# Patient Record
Sex: Male | Born: 1935
Health system: Southern US, Community
[De-identification: ages and names within clinical notes are randomized; demographics above are authoritative.]

## PROBLEM LIST (undated history)

## (undated) DIAGNOSIS — I10 Essential (primary) hypertension: Secondary | ICD-10-CM

## (undated) DIAGNOSIS — I714 Abdominal aortic aneurysm, without rupture: Secondary | ICD-10-CM

## (undated) DIAGNOSIS — M4800 Spinal stenosis, site unspecified: Secondary | ICD-10-CM

## (undated) DIAGNOSIS — G459 Transient cerebral ischemic attack, unspecified: Secondary | ICD-10-CM

## (undated) DIAGNOSIS — E7800 Pure hypercholesterolemia, unspecified: Secondary | ICD-10-CM

## (undated) DIAGNOSIS — I2119 ST elevation (STEMI) myocardial infarction involving other coronary artery of inferior wall: Secondary | ICD-10-CM

## (undated) DIAGNOSIS — G47419 Narcolepsy without cataplexy: Secondary | ICD-10-CM

## (undated) DIAGNOSIS — I2510 Atherosclerotic heart disease of native coronary artery without angina pectoris: Secondary | ICD-10-CM

## (undated) HISTORY — DX: Atherosclerotic heart disease of native coronary artery without angina pectoris: I25.10

## (undated) HISTORY — PX: LIPOMA EXCISION: SHX5283

## (undated) HISTORY — DX: ST elevation (STEMI) myocardial infarction involving other coronary artery of inferior wall: I21.19

## (undated) HISTORY — PX: SPINAL FIXATION SURGERY: SHX1055

## (undated) HISTORY — PX: CARDIAC CATHETERIZATION: SHX172

---

## 2015-07-13 DIAGNOSIS — M7580 Other shoulder lesions, unspecified shoulder: Secondary | ICD-10-CM | POA: Diagnosis not present

## 2015-08-08 DIAGNOSIS — M545 Low back pain: Secondary | ICD-10-CM | POA: Diagnosis not present

## 2015-08-08 DIAGNOSIS — M7581 Other shoulder lesions, right shoulder: Secondary | ICD-10-CM | POA: Diagnosis not present

## 2015-09-18 DIAGNOSIS — N183 Chronic kidney disease, stage 3 (moderate): Secondary | ICD-10-CM | POA: Diagnosis not present

## 2015-09-18 DIAGNOSIS — I129 Hypertensive chronic kidney disease with stage 1 through stage 4 chronic kidney disease, or unspecified chronic kidney disease: Secondary | ICD-10-CM | POA: Diagnosis not present

## 2015-09-18 DIAGNOSIS — D500 Iron deficiency anemia secondary to blood loss (chronic): Secondary | ICD-10-CM | POA: Diagnosis not present

## 2015-09-18 DIAGNOSIS — R21 Rash and other nonspecific skin eruption: Secondary | ICD-10-CM | POA: Diagnosis not present

## 2015-09-18 DIAGNOSIS — N185 Chronic kidney disease, stage 5: Secondary | ICD-10-CM | POA: Diagnosis not present

## 2015-10-25 DIAGNOSIS — D500 Iron deficiency anemia secondary to blood loss (chronic): Secondary | ICD-10-CM | POA: Diagnosis not present

## 2015-10-25 DIAGNOSIS — I5022 Chronic systolic (congestive) heart failure: Secondary | ICD-10-CM | POA: Diagnosis not present

## 2015-10-25 DIAGNOSIS — I129 Hypertensive chronic kidney disease with stage 1 through stage 4 chronic kidney disease, or unspecified chronic kidney disease: Secondary | ICD-10-CM | POA: Diagnosis not present

## 2015-10-26 DIAGNOSIS — D500 Iron deficiency anemia secondary to blood loss (chronic): Secondary | ICD-10-CM | POA: Diagnosis not present

## 2015-10-26 DIAGNOSIS — E538 Deficiency of other specified B group vitamins: Secondary | ICD-10-CM | POA: Diagnosis not present

## 2015-10-26 DIAGNOSIS — E782 Mixed hyperlipidemia: Secondary | ICD-10-CM | POA: Diagnosis not present

## 2015-10-26 DIAGNOSIS — N183 Chronic kidney disease, stage 3 (moderate): Secondary | ICD-10-CM | POA: Diagnosis not present

## 2015-11-15 DIAGNOSIS — Z8673 Personal history of transient ischemic attack (TIA), and cerebral infarction without residual deficits: Secondary | ICD-10-CM | POA: Diagnosis not present

## 2015-11-15 DIAGNOSIS — G459 Transient cerebral ischemic attack, unspecified: Secondary | ICD-10-CM | POA: Diagnosis not present

## 2015-11-16 DIAGNOSIS — Z8673 Personal history of transient ischemic attack (TIA), and cerebral infarction without residual deficits: Secondary | ICD-10-CM | POA: Diagnosis not present

## 2015-11-20 DIAGNOSIS — M1830 Unilateral post-traumatic osteoarthritis of first carpometacarpal joint, unspecified hand: Secondary | ICD-10-CM | POA: Diagnosis not present

## 2015-11-21 DIAGNOSIS — N183 Chronic kidney disease, stage 3 (moderate): Secondary | ICD-10-CM | POA: Diagnosis not present

## 2015-11-28 DIAGNOSIS — N183 Chronic kidney disease, stage 3 (moderate): Secondary | ICD-10-CM | POA: Diagnosis not present

## 2015-11-28 DIAGNOSIS — R39198 Other difficulties with micturition: Secondary | ICD-10-CM | POA: Diagnosis not present

## 2015-11-30 DIAGNOSIS — I509 Heart failure, unspecified: Secondary | ICD-10-CM | POA: Diagnosis not present

## 2015-11-30 DIAGNOSIS — Z01810 Encounter for preprocedural cardiovascular examination: Secondary | ICD-10-CM | POA: Diagnosis not present

## 2015-12-05 DIAGNOSIS — N183 Chronic kidney disease, stage 3 (moderate): Secondary | ICD-10-CM | POA: Diagnosis not present

## 2015-12-05 DIAGNOSIS — I272 Other secondary pulmonary hypertension: Secondary | ICD-10-CM | POA: Diagnosis not present

## 2015-12-07 DIAGNOSIS — I6782 Cerebral ischemia: Secondary | ICD-10-CM | POA: Diagnosis not present

## 2015-12-07 DIAGNOSIS — G319 Degenerative disease of nervous system, unspecified: Secondary | ICD-10-CM | POA: Diagnosis not present

## 2015-12-07 DIAGNOSIS — Z8673 Personal history of transient ischemic attack (TIA), and cerebral infarction without residual deficits: Secondary | ICD-10-CM | POA: Diagnosis not present

## 2015-12-18 DIAGNOSIS — M25561 Pain in right knee: Secondary | ICD-10-CM | POA: Diagnosis not present

## 2016-01-30 DIAGNOSIS — N183 Chronic kidney disease, stage 3 (moderate): Secondary | ICD-10-CM | POA: Diagnosis not present

## 2016-03-27 DIAGNOSIS — M12811 Other specific arthropathies, not elsewhere classified, right shoulder: Secondary | ICD-10-CM | POA: Diagnosis not present

## 2016-03-31 ENCOUNTER — Other Ambulatory Visit: Payer: Self-pay | Admitting: Geriatric Medicine

## 2016-03-31 DIAGNOSIS — Z23 Encounter for immunization: Secondary | ICD-10-CM | POA: Diagnosis not present

## 2016-03-31 DIAGNOSIS — I714 Abdominal aortic aneurysm, without rupture, unspecified: Secondary | ICD-10-CM

## 2016-03-31 DIAGNOSIS — R351 Nocturia: Secondary | ICD-10-CM | POA: Diagnosis not present

## 2016-03-31 DIAGNOSIS — I129 Hypertensive chronic kidney disease with stage 1 through stage 4 chronic kidney disease, or unspecified chronic kidney disease: Secondary | ICD-10-CM | POA: Diagnosis not present

## 2016-03-31 DIAGNOSIS — M109 Gout, unspecified: Secondary | ICD-10-CM | POA: Diagnosis not present

## 2016-03-31 DIAGNOSIS — D539 Nutritional anemia, unspecified: Secondary | ICD-10-CM | POA: Diagnosis not present

## 2016-03-31 DIAGNOSIS — N401 Enlarged prostate with lower urinary tract symptoms: Secondary | ICD-10-CM | POA: Diagnosis not present

## 2016-03-31 DIAGNOSIS — Z79899 Other long term (current) drug therapy: Secondary | ICD-10-CM | POA: Diagnosis not present

## 2016-03-31 DIAGNOSIS — R413 Other amnesia: Secondary | ICD-10-CM | POA: Diagnosis not present

## 2016-03-31 DIAGNOSIS — E7800 Pure hypercholesterolemia, unspecified: Secondary | ICD-10-CM | POA: Diagnosis not present

## 2016-03-31 DIAGNOSIS — Z7189 Other specified counseling: Secondary | ICD-10-CM | POA: Diagnosis not present

## 2016-04-09 DIAGNOSIS — M170 Bilateral primary osteoarthritis of knee: Secondary | ICD-10-CM | POA: Diagnosis not present

## 2016-04-09 DIAGNOSIS — M12811 Other specific arthropathies, not elsewhere classified, right shoulder: Secondary | ICD-10-CM | POA: Diagnosis not present

## 2016-04-09 DIAGNOSIS — M19011 Primary osteoarthritis, right shoulder: Secondary | ICD-10-CM | POA: Diagnosis not present

## 2016-04-09 DIAGNOSIS — M24173 Other articular cartilage disorders, unspecified ankle: Secondary | ICD-10-CM | POA: Diagnosis not present

## 2016-04-10 ENCOUNTER — Ambulatory Visit
Admission: RE | Admit: 2016-04-10 | Discharge: 2016-04-10 | Disposition: A | Discharge status: Home / Self Care | Payer: Medicare Other | Source: Ambulatory Visit | Attending: Geriatric Medicine | Admitting: Geriatric Medicine

## 2016-04-10 DIAGNOSIS — Z136 Encounter for screening for cardiovascular disorders: Secondary | ICD-10-CM | POA: Diagnosis not present

## 2016-04-10 DIAGNOSIS — I714 Abdominal aortic aneurysm, without rupture, unspecified: Secondary | ICD-10-CM

## 2016-04-16 DIAGNOSIS — R29898 Other symptoms and signs involving the musculoskeletal system: Secondary | ICD-10-CM | POA: Diagnosis not present

## 2016-04-22 DIAGNOSIS — R29898 Other symptoms and signs involving the musculoskeletal system: Secondary | ICD-10-CM | POA: Diagnosis not present

## 2016-04-24 DIAGNOSIS — R29898 Other symptoms and signs involving the musculoskeletal system: Secondary | ICD-10-CM | POA: Diagnosis not present

## 2016-04-29 DIAGNOSIS — R29898 Other symptoms and signs involving the musculoskeletal system: Secondary | ICD-10-CM | POA: Diagnosis not present

## 2016-05-01 DIAGNOSIS — R29898 Other symptoms and signs involving the musculoskeletal system: Secondary | ICD-10-CM | POA: Diagnosis not present

## 2016-05-06 DIAGNOSIS — R29898 Other symptoms and signs involving the musculoskeletal system: Secondary | ICD-10-CM | POA: Diagnosis not present

## 2016-05-08 DIAGNOSIS — R29898 Other symptoms and signs involving the musculoskeletal system: Secondary | ICD-10-CM | POA: Diagnosis not present

## 2016-05-13 DIAGNOSIS — R29898 Other symptoms and signs involving the musculoskeletal system: Secondary | ICD-10-CM | POA: Diagnosis not present

## 2016-05-15 DIAGNOSIS — R29898 Other symptoms and signs involving the musculoskeletal system: Secondary | ICD-10-CM | POA: Diagnosis not present

## 2016-07-03 DIAGNOSIS — M545 Low back pain: Secondary | ICD-10-CM | POA: Diagnosis not present

## 2016-07-03 DIAGNOSIS — M170 Bilateral primary osteoarthritis of knee: Secondary | ICD-10-CM | POA: Diagnosis not present

## 2016-07-03 DIAGNOSIS — M1711 Unilateral primary osteoarthritis, right knee: Secondary | ICD-10-CM | POA: Diagnosis not present

## 2016-07-10 DIAGNOSIS — M545 Low back pain: Secondary | ICD-10-CM | POA: Diagnosis not present

## 2016-07-17 DIAGNOSIS — M48062 Spinal stenosis, lumbar region with neurogenic claudication: Secondary | ICD-10-CM | POA: Diagnosis not present

## 2016-07-17 DIAGNOSIS — M545 Low back pain: Secondary | ICD-10-CM | POA: Diagnosis not present

## 2016-07-17 DIAGNOSIS — M5137 Other intervertebral disc degeneration, lumbosacral region: Secondary | ICD-10-CM | POA: Diagnosis not present

## 2016-07-21 DIAGNOSIS — M48062 Spinal stenosis, lumbar region with neurogenic claudication: Secondary | ICD-10-CM | POA: Diagnosis not present

## 2016-07-29 DIAGNOSIS — D649 Anemia, unspecified: Secondary | ICD-10-CM | POA: Diagnosis not present

## 2016-07-29 DIAGNOSIS — D692 Other nonthrombocytopenic purpura: Secondary | ICD-10-CM | POA: Diagnosis not present

## 2016-07-29 DIAGNOSIS — I129 Hypertensive chronic kidney disease with stage 1 through stage 4 chronic kidney disease, or unspecified chronic kidney disease: Secondary | ICD-10-CM | POA: Diagnosis not present

## 2016-07-29 DIAGNOSIS — I270 Primary pulmonary hypertension: Secondary | ICD-10-CM | POA: Diagnosis not present

## 2016-07-29 DIAGNOSIS — I700 Atherosclerosis of aorta: Secondary | ICD-10-CM | POA: Diagnosis not present

## 2016-07-29 DIAGNOSIS — Z79899 Other long term (current) drug therapy: Secondary | ICD-10-CM | POA: Diagnosis not present

## 2016-08-04 DIAGNOSIS — L570 Actinic keratosis: Secondary | ICD-10-CM | POA: Diagnosis not present

## 2016-08-04 DIAGNOSIS — Z23 Encounter for immunization: Secondary | ICD-10-CM | POA: Diagnosis not present

## 2016-08-04 DIAGNOSIS — D225 Melanocytic nevi of trunk: Secondary | ICD-10-CM | POA: Diagnosis not present

## 2016-08-04 DIAGNOSIS — L821 Other seborrheic keratosis: Secondary | ICD-10-CM | POA: Diagnosis not present

## 2016-08-04 DIAGNOSIS — L814 Other melanin hyperpigmentation: Secondary | ICD-10-CM | POA: Diagnosis not present

## 2016-08-04 DIAGNOSIS — L219 Seborrheic dermatitis, unspecified: Secondary | ICD-10-CM | POA: Diagnosis not present

## 2016-08-04 DIAGNOSIS — D1801 Hemangioma of skin and subcutaneous tissue: Secondary | ICD-10-CM | POA: Diagnosis not present

## 2016-08-05 DIAGNOSIS — M48062 Spinal stenosis, lumbar region with neurogenic claudication: Secondary | ICD-10-CM | POA: Diagnosis not present

## 2016-09-16 DIAGNOSIS — L309 Dermatitis, unspecified: Secondary | ICD-10-CM | POA: Diagnosis not present

## 2016-09-16 DIAGNOSIS — M5137 Other intervertebral disc degeneration, lumbosacral region: Secondary | ICD-10-CM | POA: Diagnosis not present

## 2016-09-16 DIAGNOSIS — L821 Other seborrheic keratosis: Secondary | ICD-10-CM | POA: Diagnosis not present

## 2016-09-16 DIAGNOSIS — Z23 Encounter for immunization: Secondary | ICD-10-CM | POA: Diagnosis not present

## 2016-09-16 DIAGNOSIS — M48062 Spinal stenosis, lumbar region with neurogenic claudication: Secondary | ICD-10-CM | POA: Diagnosis not present

## 2016-09-16 DIAGNOSIS — L219 Seborrheic dermatitis, unspecified: Secondary | ICD-10-CM | POA: Diagnosis not present

## 2016-09-29 DIAGNOSIS — L309 Dermatitis, unspecified: Secondary | ICD-10-CM | POA: Diagnosis not present

## 2016-10-06 DIAGNOSIS — N183 Chronic kidney disease, stage 3 (moderate): Secondary | ICD-10-CM | POA: Diagnosis not present

## 2016-10-06 DIAGNOSIS — I129 Hypertensive chronic kidney disease with stage 1 through stage 4 chronic kidney disease, or unspecified chronic kidney disease: Secondary | ICD-10-CM | POA: Diagnosis not present

## 2016-10-06 DIAGNOSIS — L299 Pruritus, unspecified: Secondary | ICD-10-CM | POA: Diagnosis not present

## 2016-10-06 DIAGNOSIS — D692 Other nonthrombocytopenic purpura: Secondary | ICD-10-CM | POA: Diagnosis not present

## 2016-10-06 DIAGNOSIS — Z79899 Other long term (current) drug therapy: Secondary | ICD-10-CM | POA: Diagnosis not present

## 2016-10-13 DIAGNOSIS — F22 Delusional disorders: Secondary | ICD-10-CM | POA: Diagnosis not present

## 2016-10-23 DIAGNOSIS — M79642 Pain in left hand: Secondary | ICD-10-CM | POA: Diagnosis not present

## 2016-10-23 DIAGNOSIS — M79641 Pain in right hand: Secondary | ICD-10-CM | POA: Diagnosis not present

## 2016-10-23 DIAGNOSIS — M720 Palmar fascial fibromatosis [Dupuytren]: Secondary | ICD-10-CM | POA: Diagnosis not present

## 2016-11-03 DIAGNOSIS — M720 Palmar fascial fibromatosis [Dupuytren]: Secondary | ICD-10-CM | POA: Diagnosis not present

## 2016-11-05 DIAGNOSIS — M720 Palmar fascial fibromatosis [Dupuytren]: Secondary | ICD-10-CM | POA: Diagnosis not present

## 2016-11-19 DIAGNOSIS — M720 Palmar fascial fibromatosis [Dupuytren]: Secondary | ICD-10-CM | POA: Diagnosis not present

## 2016-12-22 DIAGNOSIS — M720 Palmar fascial fibromatosis [Dupuytren]: Secondary | ICD-10-CM | POA: Diagnosis not present

## 2016-12-24 DIAGNOSIS — M720 Palmar fascial fibromatosis [Dupuytren]: Secondary | ICD-10-CM | POA: Diagnosis not present

## 2017-01-07 DIAGNOSIS — M720 Palmar fascial fibromatosis [Dupuytren]: Secondary | ICD-10-CM | POA: Diagnosis not present

## 2017-01-12 DIAGNOSIS — I700 Atherosclerosis of aorta: Secondary | ICD-10-CM | POA: Diagnosis not present

## 2017-01-12 DIAGNOSIS — Z1389 Encounter for screening for other disorder: Secondary | ICD-10-CM | POA: Diagnosis not present

## 2017-01-12 DIAGNOSIS — I270 Primary pulmonary hypertension: Secondary | ICD-10-CM | POA: Diagnosis not present

## 2017-01-12 DIAGNOSIS — E669 Obesity, unspecified: Secondary | ICD-10-CM | POA: Diagnosis not present

## 2017-01-12 DIAGNOSIS — N183 Chronic kidney disease, stage 3 (moderate): Secondary | ICD-10-CM | POA: Diagnosis not present

## 2017-01-12 DIAGNOSIS — E7800 Pure hypercholesterolemia, unspecified: Secondary | ICD-10-CM | POA: Diagnosis not present

## 2017-01-12 DIAGNOSIS — Z79899 Other long term (current) drug therapy: Secondary | ICD-10-CM | POA: Diagnosis not present

## 2017-01-12 DIAGNOSIS — Z0000 Encounter for general adult medical examination without abnormal findings: Secondary | ICD-10-CM | POA: Diagnosis not present

## 2017-01-12 DIAGNOSIS — I129 Hypertensive chronic kidney disease with stage 1 through stage 4 chronic kidney disease, or unspecified chronic kidney disease: Secondary | ICD-10-CM | POA: Diagnosis not present

## 2017-01-12 DIAGNOSIS — Z6830 Body mass index (BMI) 30.0-30.9, adult: Secondary | ICD-10-CM | POA: Diagnosis not present

## 2017-02-24 DIAGNOSIS — L299 Pruritus, unspecified: Secondary | ICD-10-CM | POA: Diagnosis not present

## 2017-02-24 DIAGNOSIS — L739 Follicular disorder, unspecified: Secondary | ICD-10-CM | POA: Diagnosis not present

## 2017-03-11 DIAGNOSIS — Z79899 Other long term (current) drug therapy: Secondary | ICD-10-CM | POA: Diagnosis not present

## 2017-04-03 DIAGNOSIS — Z23 Encounter for immunization: Secondary | ICD-10-CM | POA: Diagnosis not present

## 2017-05-11 DIAGNOSIS — M109 Gout, unspecified: Secondary | ICD-10-CM | POA: Diagnosis not present

## 2017-05-18 DIAGNOSIS — F424 Excoriation (skin-picking) disorder: Secondary | ICD-10-CM | POA: Diagnosis not present

## 2017-05-18 DIAGNOSIS — L570 Actinic keratosis: Secondary | ICD-10-CM | POA: Diagnosis not present

## 2017-05-25 IMAGING — US US AORTA
1 series · 14 of 20 positions shown · non-contrast
Comparison: None.

CLINICAL DATA: Screening for abdominal aortic aneurysm.

EXAM:
ULTRASOUND OF ABDOMINAL AORTA
TECHNIQUE: Ultrasound examination of the abdominal aorta was performed to
evaluate for abdominal aortic aneurysm.

[Series 1: us aorta · 0.37mm/px · 14 of 20 slices shown]
[im 1/20]
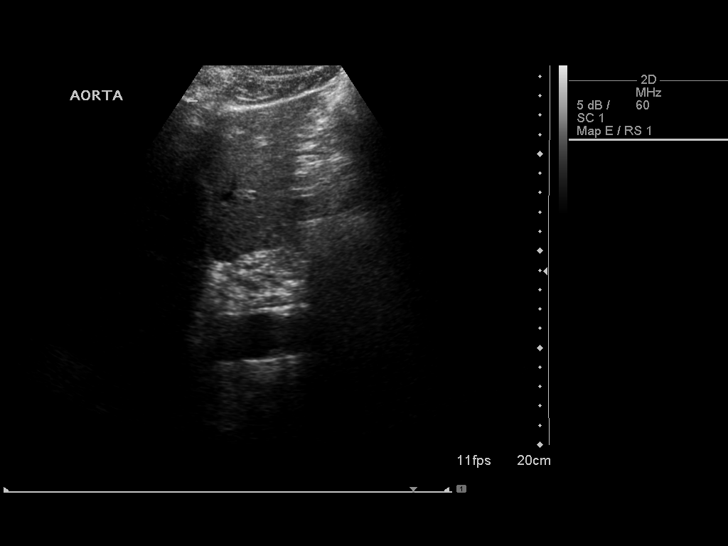
[im 3/20]
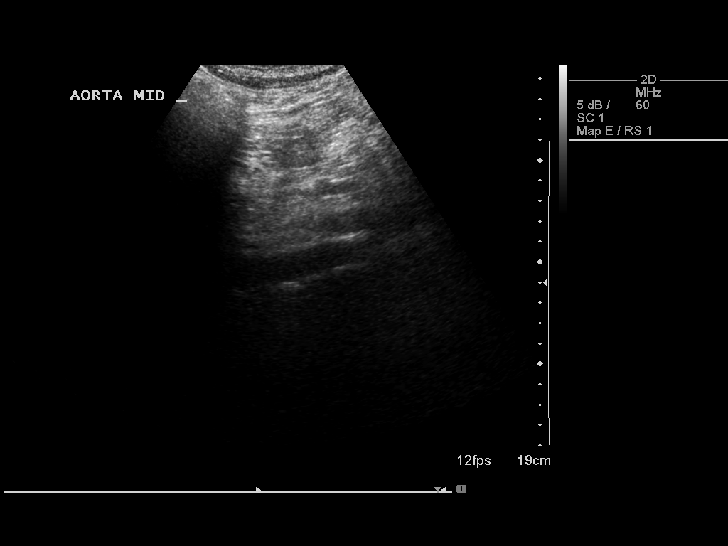
[im 4/20]
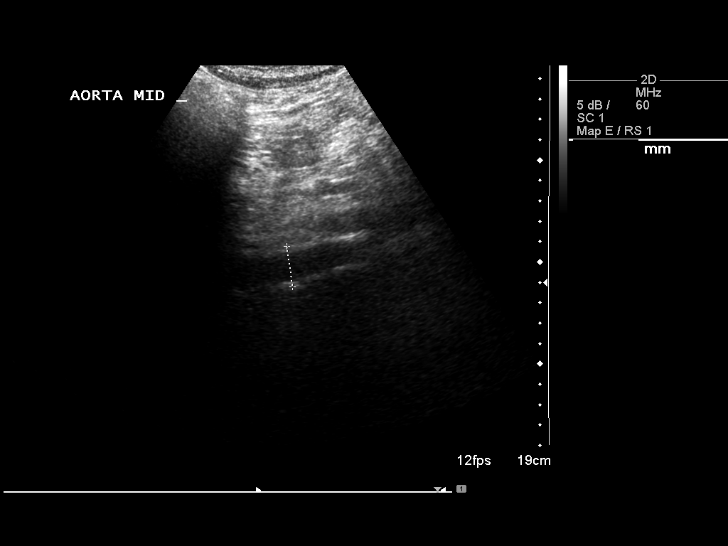
[im 6/20]
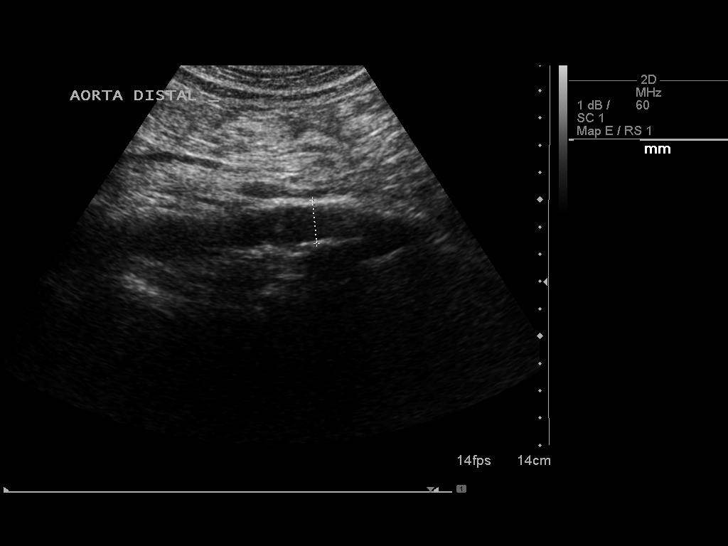
[im 7/20]
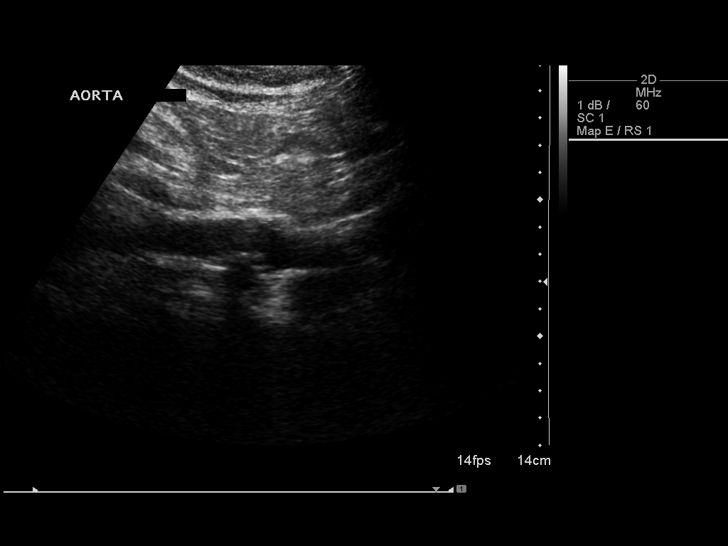
[im 8/20]
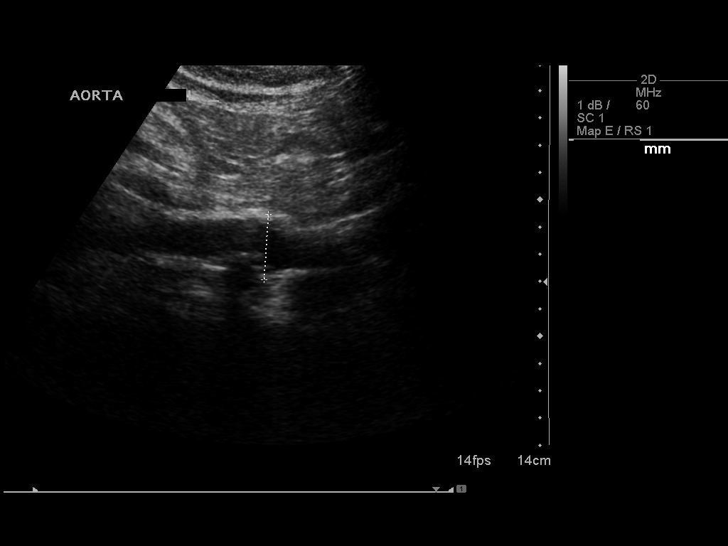
[im 10/20]
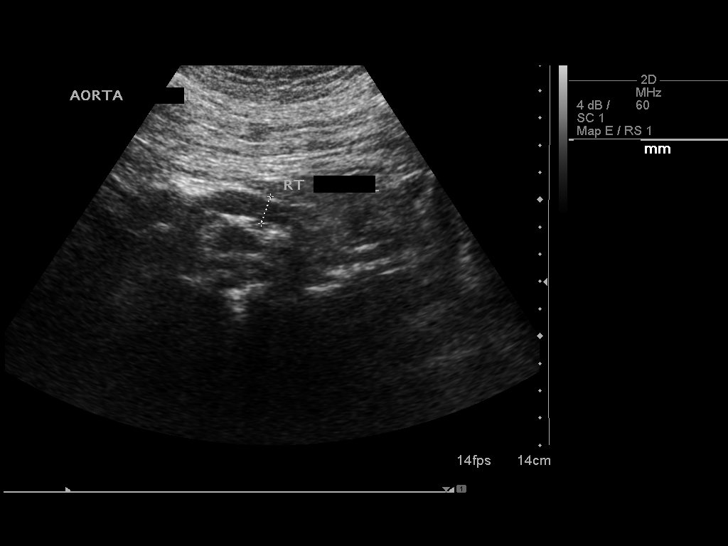
[im 11/20]
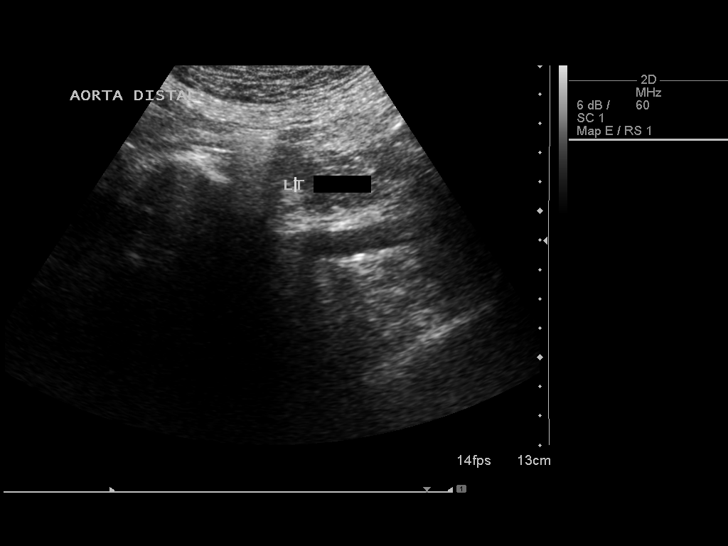
[im 13/20]
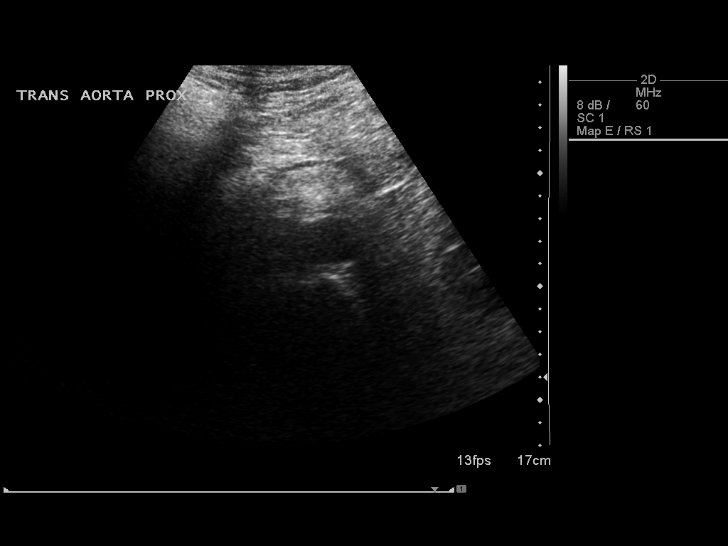
[im 14/20]
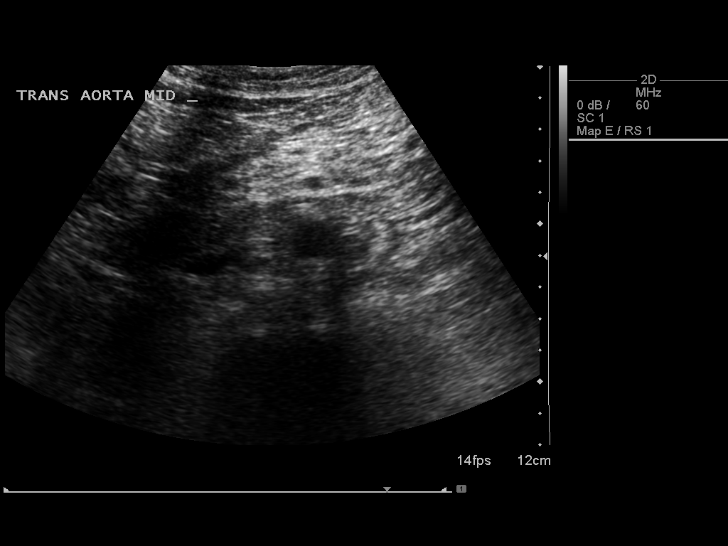
[im 16/20]
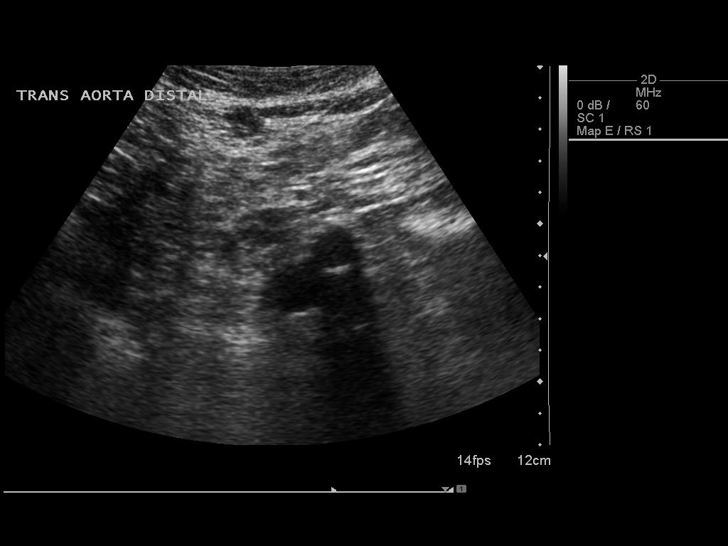
[im 17/20]
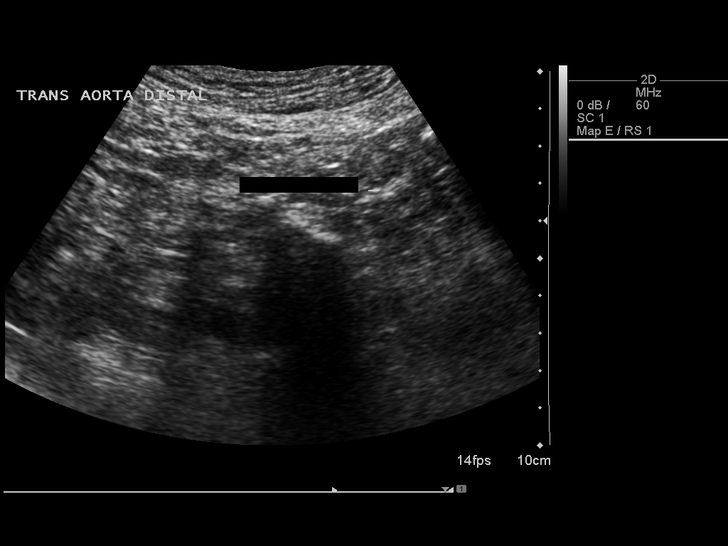
[im 18/20]
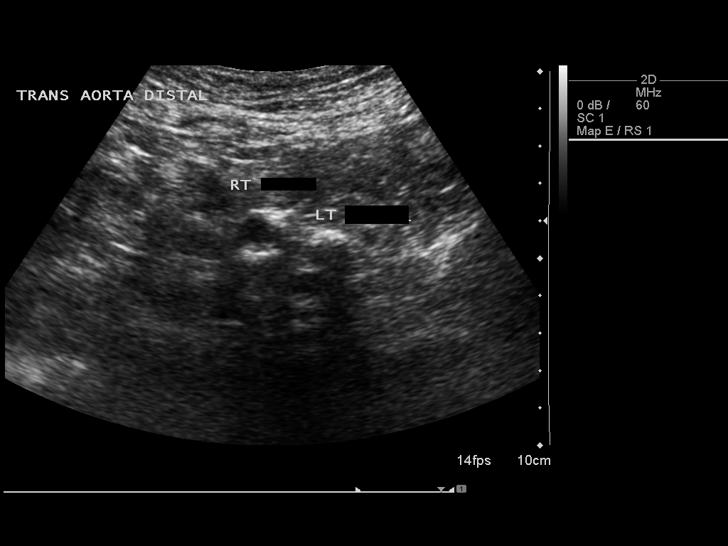
[im 20/20]
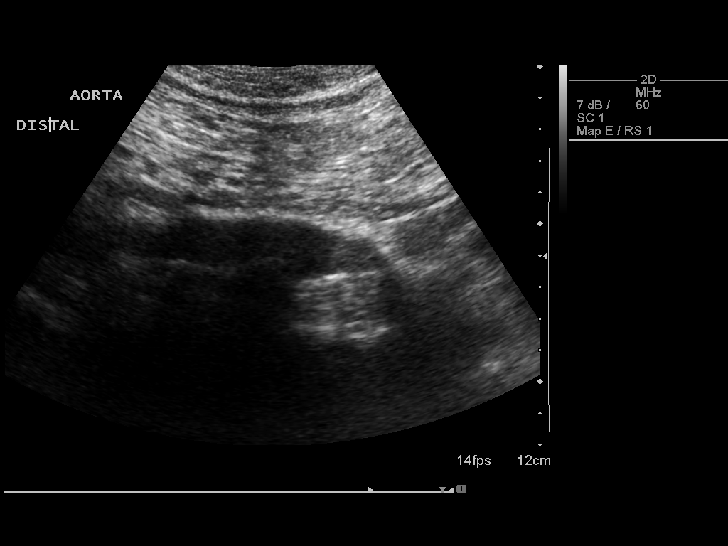

[14 of 20 positions shown; findings below may reference images not displayed]

FINDINGS: Abdominal Aorta

Atherosclerosis of abdominal aorta is noted.

Maximum Diameter: 2.8 cm proximally.

Right common iliac artery measures 1.1 cm. Left common iliac artery
measures 1.2 cm.
IMPRESSION: Atherosclerosis of abdominal aorta is noted. No evidence of
abdominal aortic aneurysm.

## 2017-07-14 DIAGNOSIS — N183 Chronic kidney disease, stage 3 (moderate): Secondary | ICD-10-CM | POA: Diagnosis not present

## 2017-07-14 DIAGNOSIS — I129 Hypertensive chronic kidney disease with stage 1 through stage 4 chronic kidney disease, or unspecified chronic kidney disease: Secondary | ICD-10-CM | POA: Diagnosis not present

## 2017-10-19 DIAGNOSIS — H2513 Age-related nuclear cataract, bilateral: Secondary | ICD-10-CM | POA: Diagnosis not present

## 2017-11-02 DIAGNOSIS — H524 Presbyopia: Secondary | ICD-10-CM | POA: Diagnosis not present

## 2017-11-02 DIAGNOSIS — H2513 Age-related nuclear cataract, bilateral: Secondary | ICD-10-CM | POA: Diagnosis not present

## 2017-11-02 DIAGNOSIS — H31002 Unspecified chorioretinal scars, left eye: Secondary | ICD-10-CM | POA: Diagnosis not present

## 2017-11-02 DIAGNOSIS — H25043 Posterior subcapsular polar age-related cataract, bilateral: Secondary | ICD-10-CM | POA: Diagnosis not present

## 2017-11-03 DIAGNOSIS — D539 Nutritional anemia, unspecified: Secondary | ICD-10-CM | POA: Diagnosis not present

## 2017-11-03 DIAGNOSIS — I129 Hypertensive chronic kidney disease with stage 1 through stage 4 chronic kidney disease, or unspecified chronic kidney disease: Secondary | ICD-10-CM | POA: Diagnosis not present

## 2017-11-03 DIAGNOSIS — Z79899 Other long term (current) drug therapy: Secondary | ICD-10-CM | POA: Diagnosis not present

## 2017-11-03 DIAGNOSIS — G4489 Other headache syndrome: Secondary | ICD-10-CM | POA: Diagnosis not present

## 2017-11-03 DIAGNOSIS — L989 Disorder of the skin and subcutaneous tissue, unspecified: Secondary | ICD-10-CM | POA: Diagnosis not present

## 2017-11-03 DIAGNOSIS — H539 Unspecified visual disturbance: Secondary | ICD-10-CM | POA: Diagnosis not present

## 2017-11-03 DIAGNOSIS — N183 Chronic kidney disease, stage 3 (moderate): Secondary | ICD-10-CM | POA: Diagnosis not present

## 2017-11-28 DIAGNOSIS — I2119 ST elevation (STEMI) myocardial infarction involving other coronary artery of inferior wall: Secondary | ICD-10-CM

## 2017-11-28 HISTORY — DX: ST elevation (STEMI) myocardial infarction involving other coronary artery of inferior wall: I21.19

## 2017-11-29 ENCOUNTER — Inpatient Hospital Stay (HOSPITAL_COMMUNITY)
Admission: EM | Admit: 2017-11-29 | Discharge: 2017-12-11 | DRG: 247 | Disposition: A | Discharge status: Home / Self Care | Payer: Medicare Other | Attending: Cardiology | Admitting: Cardiology

## 2017-11-29 ENCOUNTER — Encounter (HOSPITAL_COMMUNITY): Payer: Self-pay | Admitting: Emergency Medicine

## 2017-11-29 ENCOUNTER — Emergency Department (HOSPITAL_COMMUNITY): Payer: Medicare Other

## 2017-11-29 ENCOUNTER — Other Ambulatory Visit: Payer: Self-pay

## 2017-11-29 ENCOUNTER — Inpatient Hospital Stay (HOSPITAL_COMMUNITY)
Admission: EM | Disposition: A | Discharge status: Home / Self Care | Payer: Self-pay | Source: Home / Self Care | Attending: Cardiology

## 2017-11-29 DIAGNOSIS — D638 Anemia in other chronic diseases classified elsewhere: Secondary | ICD-10-CM | POA: Diagnosis present

## 2017-11-29 DIAGNOSIS — D7589 Other specified diseases of blood and blood-forming organs: Secondary | ICD-10-CM | POA: Diagnosis not present

## 2017-11-29 DIAGNOSIS — L7632 Postprocedural hematoma of skin and subcutaneous tissue following other procedure: Secondary | ICD-10-CM | POA: Diagnosis not present

## 2017-11-29 DIAGNOSIS — I10 Essential (primary) hypertension: Secondary | ICD-10-CM | POA: Diagnosis present

## 2017-11-29 DIAGNOSIS — M109 Gout, unspecified: Secondary | ICD-10-CM | POA: Diagnosis present

## 2017-11-29 DIAGNOSIS — E875 Hyperkalemia: Secondary | ICD-10-CM | POA: Diagnosis present

## 2017-11-29 DIAGNOSIS — Y92238 Other place in hospital as the place of occurrence of the external cause: Secondary | ICD-10-CM | POA: Diagnosis not present

## 2017-11-29 DIAGNOSIS — E7800 Pure hypercholesterolemia, unspecified: Secondary | ICD-10-CM | POA: Diagnosis not present

## 2017-11-29 DIAGNOSIS — I472 Ventricular tachycardia: Secondary | ICD-10-CM | POA: Diagnosis not present

## 2017-11-29 DIAGNOSIS — E785 Hyperlipidemia, unspecified: Secondary | ICD-10-CM | POA: Diagnosis present

## 2017-11-29 DIAGNOSIS — R0602 Shortness of breath: Secondary | ICD-10-CM | POA: Diagnosis not present

## 2017-11-29 DIAGNOSIS — I213 ST elevation (STEMI) myocardial infarction of unspecified site: Secondary | ICD-10-CM

## 2017-11-29 DIAGNOSIS — L89020 Pressure ulcer of left elbow, unstageable: Secondary | ICD-10-CM | POA: Diagnosis not present

## 2017-11-29 DIAGNOSIS — Z6832 Body mass index (BMI) 32.0-32.9, adult: Secondary | ICD-10-CM | POA: Diagnosis not present

## 2017-11-29 DIAGNOSIS — K5900 Constipation, unspecified: Secondary | ICD-10-CM | POA: Diagnosis not present

## 2017-11-29 DIAGNOSIS — I130 Hypertensive heart and chronic kidney disease with heart failure and stage 1 through stage 4 chronic kidney disease, or unspecified chronic kidney disease: Secondary | ICD-10-CM | POA: Diagnosis not present

## 2017-11-29 DIAGNOSIS — N401 Enlarged prostate with lower urinary tract symptoms: Secondary | ICD-10-CM | POA: Diagnosis present

## 2017-11-29 DIAGNOSIS — R05 Cough: Secondary | ICD-10-CM | POA: Diagnosis not present

## 2017-11-29 DIAGNOSIS — Z79899 Other long term (current) drug therapy: Secondary | ICD-10-CM

## 2017-11-29 DIAGNOSIS — D696 Thrombocytopenia, unspecified: Secondary | ICD-10-CM | POA: Diagnosis not present

## 2017-11-29 DIAGNOSIS — N390 Urinary tract infection, site not specified: Secondary | ICD-10-CM | POA: Diagnosis not present

## 2017-11-29 DIAGNOSIS — Y840 Cardiac catheterization as the cause of abnormal reaction of the patient, or of later complication, without mention of misadventure at the time of the procedure: Secondary | ICD-10-CM | POA: Diagnosis not present

## 2017-11-29 DIAGNOSIS — Z7982 Long term (current) use of aspirin: Secondary | ICD-10-CM

## 2017-11-29 DIAGNOSIS — I25110 Atherosclerotic heart disease of native coronary artery with unstable angina pectoris: Secondary | ICD-10-CM | POA: Diagnosis not present

## 2017-11-29 DIAGNOSIS — I2111 ST elevation (STEMI) myocardial infarction involving right coronary artery: Secondary | ICD-10-CM

## 2017-11-29 DIAGNOSIS — I340 Nonrheumatic mitral (valve) insufficiency: Secondary | ICD-10-CM | POA: Diagnosis not present

## 2017-11-29 DIAGNOSIS — I4891 Unspecified atrial fibrillation: Secondary | ICD-10-CM

## 2017-11-29 DIAGNOSIS — E669 Obesity, unspecified: Secondary | ICD-10-CM | POA: Diagnosis present

## 2017-11-29 DIAGNOSIS — M7989 Other specified soft tissue disorders: Secondary | ICD-10-CM | POA: Diagnosis not present

## 2017-11-29 DIAGNOSIS — I2119 ST elevation (STEMI) myocardial infarction involving other coronary artery of inferior wall: Secondary | ICD-10-CM | POA: Diagnosis not present

## 2017-11-29 DIAGNOSIS — R338 Other retention of urine: Secondary | ICD-10-CM | POA: Diagnosis present

## 2017-11-29 DIAGNOSIS — Z955 Presence of coronary angioplasty implant and graft: Secondary | ICD-10-CM | POA: Diagnosis not present

## 2017-11-29 DIAGNOSIS — B9620 Unspecified Escherichia coli [E. coli] as the cause of diseases classified elsewhere: Secondary | ICD-10-CM | POA: Diagnosis not present

## 2017-11-29 DIAGNOSIS — N183 Chronic kidney disease, stage 3 unspecified: Secondary | ICD-10-CM

## 2017-11-29 DIAGNOSIS — R972 Elevated prostate specific antigen [PSA]: Secondary | ICD-10-CM

## 2017-11-29 DIAGNOSIS — I2510 Atherosclerotic heart disease of native coronary artery without angina pectoris: Secondary | ICD-10-CM | POA: Diagnosis not present

## 2017-11-29 DIAGNOSIS — I252 Old myocardial infarction: Secondary | ICD-10-CM | POA: Diagnosis not present

## 2017-11-29 DIAGNOSIS — I509 Heart failure, unspecified: Secondary | ICD-10-CM

## 2017-11-29 DIAGNOSIS — I110 Hypertensive heart disease with heart failure: Secondary | ICD-10-CM | POA: Diagnosis not present

## 2017-11-29 DIAGNOSIS — N179 Acute kidney failure, unspecified: Secondary | ICD-10-CM | POA: Diagnosis not present

## 2017-11-29 DIAGNOSIS — N184 Chronic kidney disease, stage 4 (severe): Secondary | ICD-10-CM | POA: Diagnosis not present

## 2017-11-29 DIAGNOSIS — Z8673 Personal history of transient ischemic attack (TIA), and cerebral infarction without residual deficits: Secondary | ICD-10-CM

## 2017-11-29 DIAGNOSIS — L8990 Pressure ulcer of unspecified site, unspecified stage: Secondary | ICD-10-CM

## 2017-11-29 DIAGNOSIS — N289 Disorder of kidney and ureter, unspecified: Secondary | ICD-10-CM | POA: Diagnosis not present

## 2017-11-29 DIAGNOSIS — I2121 ST elevation (STEMI) myocardial infarction involving left circumflex coronary artery: Secondary | ICD-10-CM | POA: Diagnosis not present

## 2017-11-29 DIAGNOSIS — I129 Hypertensive chronic kidney disease with stage 1 through stage 4 chronic kidney disease, or unspecified chronic kidney disease: Secondary | ICD-10-CM | POA: Diagnosis not present

## 2017-11-29 DIAGNOSIS — Z87891 Personal history of nicotine dependence: Secondary | ICD-10-CM

## 2017-11-29 DIAGNOSIS — E8770 Fluid overload, unspecified: Secondary | ICD-10-CM | POA: Diagnosis not present

## 2017-11-29 DIAGNOSIS — I97630 Postprocedural hematoma of a circulatory system organ or structure following a cardiac catheterization: Secondary | ICD-10-CM | POA: Diagnosis not present

## 2017-11-29 DIAGNOSIS — D631 Anemia in chronic kidney disease: Secondary | ICD-10-CM | POA: Diagnosis not present

## 2017-11-29 DIAGNOSIS — R001 Bradycardia, unspecified: Secondary | ICD-10-CM | POA: Diagnosis not present

## 2017-11-29 DIAGNOSIS — I480 Paroxysmal atrial fibrillation: Secondary | ICD-10-CM | POA: Diagnosis present

## 2017-11-29 HISTORY — PX: CORONARY BALLOON ANGIOPLASTY: CATH118233

## 2017-11-29 HISTORY — PX: LEFT HEART CATH AND CORONARY ANGIOGRAPHY: CATH118249

## 2017-11-29 HISTORY — DX: Essential (primary) hypertension: I10

## 2017-11-29 HISTORY — DX: Transient cerebral ischemic attack, unspecified: G45.9

## 2017-11-29 HISTORY — DX: Narcolepsy without cataplexy: G47.419

## 2017-11-29 HISTORY — DX: Unspecified atrial fibrillation: I48.91

## 2017-11-29 HISTORY — DX: Abdominal aortic aneurysm, without rupture: I71.4

## 2017-11-29 HISTORY — DX: Spinal stenosis, site unspecified: M48.00

## 2017-11-29 HISTORY — DX: Pure hypercholesterolemia, unspecified: E78.00

## 2017-11-29 LAB — CBC
HCT: 32.3 % — ABNORMAL LOW (ref 39.0–52.0)
Hemoglobin: 10.5 g/dL — ABNORMAL LOW (ref 13.0–17.0)
MCH: 34.3 pg — AB (ref 26.0–34.0)
MCHC: 32.5 g/dL (ref 30.0–36.0)
MCV: 105.6 fL — ABNORMAL HIGH (ref 78.0–100.0)
PLATELETS: 194 10*3/uL (ref 150–400)
RBC: 3.06 MIL/uL — AB (ref 4.22–5.81)
RDW: 12.7 % (ref 11.5–15.5)
WBC: 11.1 10*3/uL — ABNORMAL HIGH (ref 4.0–10.5)

## 2017-11-29 LAB — BASIC METABOLIC PANEL
Anion gap: 10 (ref 5–15)
BUN: 52 mg/dL — AB (ref 6–20)
CALCIUM: 8.1 mg/dL — AB (ref 8.9–10.3)
CHLORIDE: 115 mmol/L — AB (ref 101–111)
CO2: 13 mmol/L — AB (ref 22–32)
CREATININE: 1.99 mg/dL — AB (ref 0.61–1.24)
GFR calc non Af Amer: 29 mL/min — ABNORMAL LOW (ref >60)
GFR, EST AFRICAN AMERICAN: 34 mL/min — AB (ref >60)
GLUCOSE: 162 mg/dL — AB (ref 65–99)
Potassium: 5.2 mmol/L — ABNORMAL HIGH (ref 3.5–5.1)
Sodium: 138 mmol/L (ref 135–145)

## 2017-11-29 LAB — I-STAT TROPONIN, ED: TROPONIN I, POC: 1.84 ng/mL — AB (ref 0.00–0.08)

## 2017-11-29 SURGERY — LEFT HEART CATH AND CORONARY ANGIOGRAPHY
Anesthesia: LOCAL

## 2017-11-29 MED ORDER — HYDRALAZINE HCL 20 MG/ML IJ SOLN: 5.0000 mg | INTRAMUSCULAR | Status: AC | PRN

## 2017-11-29 MED ORDER — METHYLPREDNISOLONE SODIUM SUCC 125 MG IJ SOLR
125.0000 mg | Freq: Once | INTRAMUSCULAR | Status: AC
  Administered 2017-11-29: 125 mg via INTRAVENOUS
  Filled 2017-11-29: qty 2

## 2017-11-29 MED ORDER — CANGRELOR BOLUS VIA INFUSION
INTRAVENOUS | Status: DC | PRN
  Administered 2017-11-29: 2490 ug via INTRAVENOUS

## 2017-11-29 MED ORDER — ONDANSETRON HCL 4 MG/2ML IJ SOLN: 4.0000 mg | Freq: Four times a day (QID) | INTRAMUSCULAR | Status: DC | PRN

## 2017-11-29 MED ORDER — ASPIRIN 81 MG PO CHEW
324.0000 mg | CHEWABLE_TABLET | Freq: Once | ORAL | Status: AC
  Administered 2017-11-29: 324 mg via ORAL
  Filled 2017-11-29: qty 4

## 2017-11-29 MED ORDER — LABETALOL HCL 5 MG/ML IV SOLN: 10.0000 mg | INTRAVENOUS | Status: AC | PRN

## 2017-11-29 MED ORDER — VERAPAMIL HCL 2.5 MG/ML IV SOLN
INTRAVENOUS | Status: AC
  Filled 2017-11-29: qty 2

## 2017-11-29 MED ORDER — NITROGLYCERIN 0.4 MG SL SUBL: 0.4000 mg | SUBLINGUAL_TABLET | SUBLINGUAL | Status: DC | PRN

## 2017-11-29 MED ORDER — ACETAMINOPHEN 325 MG PO TABS: 650.0000 mg | ORAL_TABLET | ORAL | Status: DC | PRN

## 2017-11-29 MED ORDER — SODIUM CHLORIDE 0.9 % IV SOLN
INTRAVENOUS | Status: AC
  Administered 2017-11-29: 23:00:00 via INTRAVENOUS

## 2017-11-29 MED ORDER — NITROGLYCERIN 1 MG/10 ML FOR IR/CATH LAB
INTRA_ARTERIAL | Status: AC
  Filled 2017-11-29: qty 10

## 2017-11-29 MED ORDER — SODIUM CHLORIDE 0.9% FLUSH
3.0000 mL | Freq: Two times a day (BID) | INTRAVENOUS | Status: DC
  Administered 2017-11-30 – 2017-12-08 (×9): 3 mL via INTRAVENOUS

## 2017-11-29 MED ORDER — ACETAMINOPHEN 325 MG PO TABS
650.0000 mg | ORAL_TABLET | ORAL | Status: DC | PRN
  Administered 2017-12-02: 650 mg via ORAL
  Filled 2017-11-29: qty 2

## 2017-11-29 MED ORDER — VERAPAMIL HCL 2.5 MG/ML IV SOLN
INTRAVENOUS | Status: DC | PRN
  Administered 2017-11-29: 10 mL via INTRA_ARTERIAL

## 2017-11-29 MED ORDER — AMIODARONE LOAD VIA INFUSION
150.0000 mg | Freq: Once | INTRAVENOUS | Status: AC
  Administered 2017-11-29: 150 mg via INTRAVENOUS
  Filled 2017-11-29: qty 83.34

## 2017-11-29 MED ORDER — HEPARIN BOLUS VIA INFUSION
4000.0000 [IU] | Freq: Once | INTRAVENOUS | Status: AC
  Administered 2017-11-29: 4000 [IU] via INTRAVENOUS
  Filled 2017-11-29: qty 4000

## 2017-11-29 MED ORDER — ATORVASTATIN CALCIUM 80 MG PO TABS
80.0000 mg | ORAL_TABLET | Freq: Every day | ORAL | Status: DC
  Administered 2017-11-30 – 2017-12-10 (×10): 80 mg via ORAL
  Filled 2017-11-29 (×10): qty 1

## 2017-11-29 MED ORDER — ASPIRIN EC 325 MG PO TBEC
325.0000 mg | DELAYED_RELEASE_TABLET | Freq: Once | ORAL | Status: DC
  Filled 2017-11-29: qty 1

## 2017-11-29 MED ORDER — HEPARIN SODIUM (PORCINE) 1000 UNIT/ML IJ SOLN
INTRAMUSCULAR | Status: DC | PRN
  Administered 2017-11-29 (×2): 4000 [IU] via INTRAVENOUS

## 2017-11-29 MED ORDER — LIDOCAINE HCL (PF) 1 % IJ SOLN
INTRAMUSCULAR | Status: AC
  Filled 2017-11-29: qty 30

## 2017-11-29 MED ORDER — SODIUM CHLORIDE 0.9 % IV SOLN
INTRAVENOUS | Status: AC | PRN
  Administered 2017-11-29: 4 ug/kg/min via INTRAVENOUS

## 2017-11-29 MED ORDER — METOPROLOL TARTRATE 12.5 MG HALF TABLET
12.5000 mg | ORAL_TABLET | Freq: Two times a day (BID) | ORAL | Status: DC
  Administered 2017-11-30 – 2017-12-09 (×17): 12.5 mg via ORAL
  Filled 2017-11-29 (×20): qty 1

## 2017-11-29 MED ORDER — ALLOPURINOL 100 MG PO TABS
100.0000 mg | ORAL_TABLET | ORAL | Status: DC
  Administered 2017-11-30 – 2017-12-11 (×12): 100 mg via ORAL
  Filled 2017-11-29 (×15): qty 1

## 2017-11-29 MED ORDER — HEPARIN (PORCINE) IN NACL 1000-0.9 UT/500ML-% IV SOLN
INTRAVENOUS | Status: AC
  Filled 2017-11-29: qty 500

## 2017-11-29 MED ORDER — ASPIRIN 81 MG PO CHEW
81.0000 mg | CHEWABLE_TABLET | Freq: Every day | ORAL | Status: DC
  Administered 2017-11-30 – 2017-12-11 (×11): 81 mg via ORAL
  Filled 2017-11-29 (×12): qty 1

## 2017-11-29 MED ORDER — ATORVASTATIN CALCIUM 80 MG PO TABS: 80.0000 mg | ORAL_TABLET | Freq: Every day | ORAL | Status: DC

## 2017-11-29 MED ORDER — AMOXICILLIN 500 MG PO CAPS
500.0000 mg | ORAL_CAPSULE | Freq: Three times a day (TID) | ORAL | Status: DC
  Administered 2017-11-30: 500 mg via ORAL
  Filled 2017-11-29 (×2): qty 1

## 2017-11-29 MED ORDER — TERAZOSIN HCL 2 MG PO CAPS: 4.0000 mg | ORAL_CAPSULE | Freq: Every evening | ORAL | Status: DC

## 2017-11-29 MED ORDER — LACTATED RINGERS IV BOLUS
1000.0000 mL | Freq: Once | INTRAVENOUS | Status: AC
Start: 2017-11-29 — End: 2017-11-29
  Administered 2017-11-29: 1000 mL via INTRAVENOUS

## 2017-11-29 MED ORDER — HEPARIN SODIUM (PORCINE) 1000 UNIT/ML IJ SOLN
INTRAMUSCULAR | Status: AC
  Filled 2017-11-29: qty 1

## 2017-11-29 MED ORDER — SODIUM CHLORIDE 0.9 % IV SOLN
INTRAVENOUS | Status: AC | PRN
  Administered 2017-11-29: 83 mL/h via INTRAVENOUS

## 2017-11-29 MED ORDER — SODIUM CHLORIDE 0.9% FLUSH: 3.0000 mL | INTRAVENOUS | Status: DC | PRN

## 2017-11-29 MED ORDER — AMIODARONE HCL IN DEXTROSE 360-4.14 MG/200ML-% IV SOLN
60.0000 mg/h | INTRAVENOUS | Status: DC
  Administered 2017-11-29: 60 mg/h via INTRAVENOUS
  Filled 2017-11-29: qty 200

## 2017-11-29 MED ORDER — LIDOCAINE HCL (PF) 1 % IJ SOLN
INTRAMUSCULAR | Status: DC | PRN
  Administered 2017-11-29: 2 mL via INTRADERMAL

## 2017-11-29 MED ORDER — ASPIRIN EC 81 MG PO TBEC: 81.0000 mg | DELAYED_RELEASE_TABLET | Freq: Every day | ORAL | Status: DC

## 2017-11-29 MED ORDER — IOHEXOL 350 MG/ML SOLN
INTRAVENOUS | Status: DC | PRN
  Administered 2017-11-29: 100 mL via INTRA_ARTERIAL

## 2017-11-29 MED ORDER — AMIODARONE HCL IN DEXTROSE 360-4.14 MG/200ML-% IV SOLN
30.0000 mg/h | INTRAVENOUS | Status: DC
  Administered 2017-11-30 – 2017-12-01 (×3): 30 mg/h via INTRAVENOUS
  Filled 2017-11-29 (×4): qty 200

## 2017-11-29 MED ORDER — HEPARIN (PORCINE) IN NACL 100-0.45 UNIT/ML-% IJ SOLN
1300.0000 [IU]/h | INTRAMUSCULAR | Status: DC
  Administered 2017-11-29: 1300 [IU]/h via INTRAVENOUS
  Filled 2017-11-29: qty 250

## 2017-11-29 MED ORDER — SODIUM CHLORIDE 0.9 % IV SOLN: 250.0000 mL | INTRAVENOUS | Status: DC | PRN

## 2017-11-29 MED ORDER — HEPARIN (PORCINE) IN NACL 2-0.9 UNITS/ML
INTRAMUSCULAR | Status: AC | PRN
  Administered 2017-11-29 (×3): 500 mL via INTRA_ARTERIAL

## 2017-11-29 MED ORDER — CANGRELOR TETRASODIUM 50 MG IV SOLR
INTRAVENOUS | Status: AC
  Filled 2017-11-29: qty 50

## 2017-11-29 SURGICAL SUPPLY — 16 items
BALLN SAPPHIRE 2.0X12 (BALLOONS) ×2
BALLOON SAPPHIRE 2.0X12 (BALLOONS) ×1 IMPLANT
CATH INFINITI 5 FR JL3.5 (CATHETERS) ×2 IMPLANT
CATH INFINITI JR4 5F (CATHETERS) ×2 IMPLANT
CATH VISTA GUIDE 6FR JR4 (CATHETERS) ×2 IMPLANT
DEVICE RAD COMP TR BAND LRG (VASCULAR PRODUCTS) ×2 IMPLANT
GLIDESHEATH SLEND A-KIT 6F 22G (SHEATH) ×2 IMPLANT
GUIDEWIRE INQWIRE 1.5J.035X260 (WIRE) ×1 IMPLANT
INQWIRE 1.5J .035X260CM (WIRE) ×2
KIT ENCORE 26 ADVANTAGE (KITS) ×2 IMPLANT
KIT HEART LEFT (KITS) ×2 IMPLANT
KIT HEMO VALVE WATCHDOG (MISCELLANEOUS) ×2 IMPLANT
PACK CARDIAC CATHETERIZATION (CUSTOM PROCEDURE TRAY) ×2 IMPLANT
TRANSDUCER W/STOPCOCK (MISCELLANEOUS) ×2 IMPLANT
TUBING CIL FLEX 10 FLL-RA (TUBING) ×2 IMPLANT
WIRE ASAHI PROWATER 180CM (WIRE) ×2 IMPLANT

## 2017-11-29 NOTE — ED Provider Notes (Signed)
[place] EMERGENCY DEPARTMENT Provider Note   CSN: [id number] Arrival date & time: 11/29/17  1915     History   Chief Complaint Chief Complaint  Patient presents with  . Shortness of Breath    HPI Zannie Runkle is a 84 y.o. male.  HPI  Patient is a 84 year old male who comes in today complaining of worsening shortness of breath and chest tightness.  Patient has history of a long car ride approximately 1 week ago and has noted some swelling and pain in his right lower extremity with distended surface veins.  Patient denies any radiation of pain, nausea, vomiting, diaphoresis.  Patient not currently on any anticoagulation and no history of A. fib, EKG collected in triage shows A. fib with RVR.  Initial troponin collected in triage also positive.   Past Medical History:  Diagnosis Date  . AAA (abdominal aortic aneurysm) ([place])   . Hypercholesteremia   . Hypertension   . Myocardial infarction [facility])    Per OLD EKG per son  . Spinal stenosis   . TIA (transient ischemic attack)     Patient Active Problem List   Diagnosis Date Noted  . STEMI (ST elevation myocardial infarction) ([place]) 11/29/2017  . HTN (hypertension) 11/29/2017  . HLD (hyperlipidemia) 11/29/2017    Past Surgical History:  Procedure Laterality Date  . LIPOMA EXCISION    . SPINAL FIXATION SURGERY          Home Medications    Prior to Admission medications   Medication Sig Start Date End Date Taking? Authorizing Provider  allopurinol (ZYLOPRIM) 100 MG tablet Take 100 mg by mouth every morning. 10/08/17  Yes [provider]  amoxicillin (AMOXIL) 500 MG capsule Take 500 mg by mouth 3 (three) times daily. For 7 days 11/26/17  Yes [provider]  aspirin EC 81 MG tablet Take 81 mg by mouth daily.   Yes [provider]  dexamethasone (DECADRON) 4 MG tablet Take 4 mg by mouth 3 (three) times daily. 11/26/17  Yes [provider]  lisinopril (PRINIVIL,ZESTRIL)  10 MG tablet Take 10 mg by mouth every morning. 10/08/17  Yes [provider]  Multiple Vitamins-Minerals (CENTRUM SILVER PO) Take 1 tablet by mouth daily.   Yes [provider]  simvastatin (ZOCOR) 10 MG tablet Take 10 mg by mouth every evening. 10/08/17  Yes [provider]  terazosin (HYTRIN) 2 MG capsule Take 4 mg by mouth every evening. 10/08/17  Yes [provider]    Family History No family history on file.  Social History Social History   Tobacco Use  . Smoking status: Former Research scientist (life sciences)  . Smokeless tobacco: Never Used  Substance Use Topics  . Alcohol use: Yes    Comment: rare  . Drug use: Never     Allergies   Contrast media [iodinated diagnostic agents]   Review of Systems Review of Systems  Constitutional: Positive for fatigue. Negative for chills, diaphoresis and fever.  Respiratory: Positive for shortness of breath.   Cardiovascular: Positive for chest pain and leg swelling.  Gastrointestinal: Negative for abdominal pain, nausea and vomiting.  All other systems reviewed and are negative.    Physical Exam Updated Vital Signs BP 123/80   Pulse (!) 106   Temp 98 F (36.7 C) (Oral)   Resp (!) 22   Ht 5\' 5"  (1.651 m)   Wt 83 kg (183 lb)   SpO2 97%   BMI 30.45 kg/m   Physical Exam  Constitutional:  He appears well-developed and well-nourished.  HENT:  Head: Normocephalic and atraumatic.  Eyes: Conjunctivae are normal.  Neck: Neck supple.  Cardiovascular:  No murmur heard. Atrial fibrillation with RVR  Pulmonary/Chest: Effort normal and breath sounds normal. No respiratory distress.  Abdominal: Soft. There is no tenderness.  Musculoskeletal:       Right lower leg: He exhibits tenderness and edema.       Left lower leg: Normal. He exhibits no tenderness and no edema.  Mild swelling of the right lower extremity with distention of surface veins.  Patient endorses tenderness to palpation.  Neurological: He is alert.    Skin: Skin is warm and dry.  Psychiatric: He has a normal mood and affect.  Nursing note and vitals reviewed.    ED Treatments / Results  Labs (all labs ordered are listed, but only abnormal results are displayed) Labs Reviewed  BASIC METABOLIC PANEL - Abnormal; Notable for the following components:      Result Value   Potassium 5.2 (*)    Chloride 115 (*)    CO2 13 (*)    Glucose, Bld 162 (*)    BUN 52 (*)    Creatinine, Ser 1.99 (*)    Calcium 8.1 (*)    GFR calc non Af Amer 29 (*)    GFR calc Af Amer 34 (*)    All other components within normal limits  CBC - Abnormal; Notable for the following components:   WBC 11.1 (*)    RBC 3.06 (*)    Hemoglobin 10.5 (*)    HCT 32.3 (*)    MCV 105.6 (*)    MCH 34.3 (*)    All other components within normal limits  I-STAT TROPONIN, ED - Abnormal; Notable for the following components:   Troponin i, poc 1.84 (*)    All other components within normal limits  HEPARIN LEVEL (UNFRACTIONATED)  CBC    EKG EKG Interpretation  Date/Time:  Sunday November 29 2017 19:22:33 EDT Ventricular Rate:  126 PR Interval:    QRS Duration: 84 QT Interval:  332 QTC Calculation: 480 R Axis:   84 Text Interpretation:  Atrial fibrillation with rapid ventricular response ST & T wave abnormality, consider inferolateral ischemia No previous tracing Confirmed by Blanchie Dessert [phone]) on 11/29/2017 7:29:48 PM Also confirmed by Elnora Morrison [phone])  on 11/29/2017 7:53:44 PM Also confirmed by Elnora Morrison [phone])  on 11/29/2017 8:09:20 PM   Radiology Dg Chest 2 View  Result Date: 11/29/2017 CLINICAL DATA:  Worsening shortness of breath with chest tightness. EXAM: CHEST - 2 VIEW COMPARISON:  None. FINDINGS: The cardiac silhouette is normal in size. Aortic atherosclerosis is noted. There are small calcified mediastinal and hilar lymph nodes suggesting prior granulomatous infection. The lungs are normally to mildly hyperinflated without evidence of airspace  consolidation, edema, pleural effusion, pneumothorax. No acute osseous abnormality is seen. IMPRESSION: No active cardiopulmonary disease. Electronically Signed   By: Logan Bores M.D.   On: 11/29/2017 20:12    Procedures Procedures (including critical care time)  Medications Ordered in ED Medications  heparin ADULT infusion 100 units/mL (25000 units/262mL sodium chloride 0.45%) (1,300 Units/hr Intravenous Transfusing/Transfer 11/29/17 2151)  lidocaine (PF) (XYLOCAINE) 1 % injection (2 mLs Intradermal Given 11/29/17 2202)  Radial Cocktail/Verapamil only (10 mLs Intra-arterial Given 11/29/17 2203)  heparin injection (4,000 Units Intravenous Given 11/29/17 2218)  0.9 %  sodium chloride infusion (83 mL/hr Intravenous New Bag/Given 11/29/17 2208)  cangrelor [facility]) bolus via infusion (2,490 mcg Intravenous  Given 11/29/17 2227)  lactated ringers bolus 1,000 mL (0 mLs Intravenous Stopped 11/29/17 2142)  heparin bolus via infusion 4,000 Units (4,000 Units Intravenous Bolus from Bag 11/29/17 2120)  methylPREDNISolone sodium succinate (SOLU-MEDROL) 125 mg/2 mL injection 125 mg (125 mg Intravenous Given 11/29/17 2135)  aspirin chewable tablet 324 mg (324 mg Oral Given 11/29/17 2135)     Initial Impression / Assessment and Plan / ED Course  I have reviewed the triage vital signs and the nursing notes.  Pertinent labs & imaging results that were available during my care of the patient were reviewed by me and considered in my medical decision making (see chart for details).     Initial concern for DVT with PE.  Patient states he has an allergy to iodine and iodinated dyes.  Will collect ultrasound of lower extremities to evaluate for DVT.  We will additionally start patient on heparin as concern is for PE versus ACS given elevation in troponin.  Patient states that he had sudden onset worsening chest pain while in the emergency department with radiation to the left upper extremity with associated shortness of  breath.  Repeat EKG now shows ST elevation concerning for STEMI.  Code STEMI called and cardiology elected to take the patient emergently to the Cath Lab for PCI.  Final Clinical Impressions(s) / ED Diagnoses   Final diagnoses:  None    ED Discharge Orders    None       Chapman Moss, MD 11/29/17 2228    Elnora Morrison, MD 11/29/17 2354

## 2017-11-29 NOTE — Progress Notes (Signed)
ANTICOAGULATION CONSULT NOTE - Initial Consult  Pharmacy Consult for heparin Indication: atrial fibrillation and possible PE and possible ACS  Allergies  Allergen Reactions  . Contrast Media [Iodinated Diagnostic Agents] Other (See Comments)    Unknown    Patient Measurements: Height: 5\' 5"  (165.1 cm) Weight: 183 lb (83 kg) IBW/kg (Calculated) : 61.5 Heparin Dosing Weight: 78kg  Vital Signs: Temp: 98 F (36.7 C) (06/02 1923) Temp Source: Oral (06/02 1923) BP: 94/62 (06/02 2030) Pulse Rate: 104 (06/02 2030)  Labs: Recent Labs    11/29/17 1934  HGB 10.5*  HCT 32.3*  PLT 194  CREATININE 1.99*    Estimated Creatinine Clearance: 26.9 mL/min (A) (by C-G formula based on SCr of 1.99 mg/dL (H)).   Assessment: 49 YOM who presents with SOB and some chest tightness. New AFib on EKG, but also cannot get imaging d/t contrast allergy so PE cannot be ruled out, troponins are also elevated. Patient is not on anticoagulation PTA.  Goal of Therapy:  Heparin level 0.3-0.7 units/ml Monitor platelets by anticoagulation protocol: Yes   Plan:   Start heparin with 4000 unit bolus IV x1, then start infusion at 1300 units/hr (~16units/kg/hr > dosing for PE since this cannot be ruled out at the moment) Heparin level 8h after start Daily heparin level and CBC Follow for long term anticoagulation plan  Dontre Laduca D. Gabbi Whetstone, PharmD, [place] Clinical Pharmacist [phone] Please check AMION for all [place] numbers 11/29/2017 8:47 PM

## 2017-11-29 NOTE — ED Triage Notes (Signed)
Pt presents with shortness of breath worsening today with some chest tightness.  Per son at bedside pt had to stop while walking several times today. Pt is currently on antbx for tooth extraction.  Pt reports he has been very active doing things around the house recently, pt reports 2 unusual episodes of hiccups in last 24 hours.

## 2017-11-29 NOTE — H&P (Signed)
History & Physical    Patient ID: Paul Kramer MRN: [id number], DOB/AGE: [birth year]   Admit date: 11/29/2017  Primary Physician: Lajean Manes, MD Primary Cardiologist: Glenetta Hew, MD  Patient Profile    Mr. Paul Kramer is an 84 year old gentleman with HTN, HL, TIA who presents with chest pressure and DOE, found to have subtle inferolateral ST elevation.  Past Medical History    Past Medical History:  Diagnosis Date  . AAA (abdominal aortic aneurysm) ([place])   . Hypercholesteremia   . Hypertension   . Myocardial infarction [facility])    Per OLD EKG per son  . Spinal stenosis   . TIA (transient ischemic attack)     Past Surgical History:  Procedure Laterality Date  . LIPOMA EXCISION    . SPINAL FIXATION SURGERY       Allergies  Allergies  Allergen Reactions  . Contrast Media [Iodinated Diagnostic Agents] Other (See Comments)    Just feel bad all over    History of Present Illness    Mr. Paul Kramer is an 84 year old gentleman with HTN, HL, TIA who presents with chest pressure and DOE, found to have subtle inferolateral ST elevation.  The patient and son-in-law provide history. Report that he is generally very functional and active. On the day of presentation, was walking and son-in-law noted that he had to stop frequently to catch his breath, which is very abnormal for him. He also noted some intermittent chest pressure which radiated to his neck. Given symptoms, son-in-law brought him to the ED today.  In the ED, he was found to be in new onset atrial fibrillation with RVR and HRs 110s-140s. Blood pressure low in the 93A systolic. Initial ECG with baseline wander and subtle ST changes in inferior leads which did not meet criteria for STEMI. While in the ED, however, patient had recurrent chest pain and ECG during chest pain showed dynamic inferolateral ST elevation for which cardiology was consulted and cath lab activated. CP improved shortly and ECG showed improvement, but not  resolution of ST changes and thus after discussion with the patient and his son-in-law, decision was made to proceed with LHC tonight.  In the cath lab, LV gram showed inferior/apical hypokinesis. Coronary angiogram with 100% chronically occluded mid RCA. LCx with 95-99% trifurcation lesion at distal vessel, thought to be likely culprit given chronicity of RCA lesion. LAD with 80% proximal lesion and LM 40%. CT surgery was called and reported that given age and comorbidities, patient should not be taken for emergent surgery tonight.   Home Medications    Prior to Admission medications   Medication Sig Start Date End Date Taking? Authorizing Provider  allopurinol (ZYLOPRIM) 100 MG tablet Take 100 mg by mouth every morning. 10/08/17  Yes [provider]  amoxicillin (AMOXIL) 500 MG capsule Take 500 mg by mouth 3 (three) times daily. For 7 days 11/26/17  Yes [provider]  aspirin EC 81 MG tablet Take 81 mg by mouth daily.   Yes [provider]  dexamethasone (DECADRON) 4 MG tablet Take 4 mg by mouth 3 (three) times daily. 11/26/17  Yes [provider]  lisinopril (PRINIVIL,ZESTRIL) 10 MG tablet Take 10 mg by mouth every morning. 10/08/17  Yes [provider]  Multiple Vitamins-Minerals (CENTRUM SILVER PO) Take 1 tablet by mouth daily.   Yes [provider]  simvastatin (ZOCOR) 10 MG tablet Take 10 mg by mouth every evening. 10/08/17  Yes [provider]  terazosin (HYTRIN)  2 MG capsule Take 4 mg by mouth every evening. 10/08/17  Yes [provider]    Family History    No family history on file. has no family status information on file.    Social History    Social History   Tobacco Use  . Smoking status: Former Research scientist (life sciences)  . Smokeless tobacco: Never Used  Substance Use Topics  . Alcohol use: Yes    Comment: rare  . Drug use: Never   Social History   Social History Narrative  . Not on file      Review of Systems      General:  No chills, fever, night sweats or weight changes.  Cardiovascular:  As per HPI Dermatological: No rash, lesions/masses Respiratory: No cough, dyspnea Urologic: No hematuria, dysuria Abdominal:   No nausea, vomiting, diarrhea, bright red blood per rectum, melena, or hematemesis Neurologic:  No visual changes, wkns, changes in mental status. All other systems reviewed and are otherwise negative except as noted above.  Physical Exam    Blood pressure 108/65, pulse 73, temperature 98 F (36.7 C), temperature source Oral, resp. rate 18, height 5\' 5"  (1.651 m), weight 183 lb (83 kg), SpO2 99 %.  General: Pleasant, NAD Psych: Normal affect. Neuro: Alert and oriented X 3. Moves all extremities spontaneously. HEENT: Normal  Neck: Supple without bruits or JVD. Lungs:  Resp regular and unlabored, CTA. Heart: Tachycardic. Irregularly irregular. No m/r/g. Abdomen: Soft, non-tender, non-distended, BS + x 4.  Extremities: No clubbing, cyanosis. DP/PT/Radials 2+ and equal bilaterally. 2+ pitting edema bilaterally.  Labs    Troponin [facility] of Care Test) Recent Labs    11/29/17 1940  TROPIPOC 1.84*   No results for input(s): CKTOTAL, CKMB, TROPONINI in the last 72 hours. Lab Results  Component Value Date   WBC 11.1 (H) 11/29/2017   HGB 10.5 (L) 11/29/2017   HCT 32.3 (L) 11/29/2017   MCV 105.6 (H) 11/29/2017   PLT 194 11/29/2017    Recent Labs  Lab 11/29/17 1934  NA 138  K 5.2*  CL 115*  CO2 13*  BUN 52*  CREATININE 1.99*  CALCIUM 8.1*  GLUCOSE 162*   No results found for: CHOL, HDL, LDLCALC, TRIG No results found for: [facility]   Radiology Studies    Dg Chest 2 View  Result Date: 11/29/2017 CLINICAL DATA:  Worsening shortness of breath with chest tightness. EXAM: CHEST - 2 VIEW COMPARISON:  None. FINDINGS: The cardiac silhouette is normal in size. Aortic atherosclerosis is noted. There are small calcified mediastinal and hilar lymph nodes suggesting prior  granulomatous infection. The lungs are normally to mildly hyperinflated without evidence of airspace consolidation, edema, pleural effusion, pneumothorax. No acute osseous abnormality is seen. IMPRESSION: No active cardiopulmonary disease. Electronically Signed   By: Logan Bores M.D.   On: 11/29/2017 20:12    ECG & Cardiac Imaging    ECG 1922: Afib with RVR, non-diagnostic ST segment changes  ECG 2107: Afib with RVR. 1-2 mm ST elevation in inferolateral leads  ECG 2122: Afib with RVR; ongoing ST elevation, although improved from prior.   Assessment & Plan    Principal Problem:   Acute ST elevation myocardial infarction (STEMI) of inferior wall (HCC) Active Problems:   New onset a-fib (HCC) - with RVR   Essential hypertension   Hyperlipidemia with target low density lipoprotein (LDL) cholesterol less than 70 mg/dL   CKD (chronic kidney disease) stage 3, GFR 30-59 ml/min [facility])  Mr. Grater presented with acute  onset exertional dyspnea and diaphoresis and chest discomfort.  He was initially noted to be in A. fib with subtle EKG changes, however with recurrence of active chest pain he did have clear 1 to 2 mm inferior elevations as well as some lateral ST elevation.  Based on this with active symptoms, we felt it prudent to proceed with cardiac catheterization despite the fact that his chest pain was resolved after IV heparin was administered along with IV fluids for hypotension.  As his rate came down, chest pain got better and his ST segments gradually improved.  Based on the positive troponin, dynamic ST elevations with intermittent active chest pain, I felt it prudent to proceed with cardiac catheterization with possible PCI. He does have CKD and therefore would need to use judicious contrast.  If PCI is indicated, would use Plavix in the setting of A. fib where he may very well need to [place].  Start beta-blocker and statin.  Consider amiodarone for attempted cardioversion to alleviate  symptoms.  Further plans based on cardiac cath findings.   Signed, Glenetta Hew, MD 11/29/2017,

## 2017-11-29 NOTE — ED Notes (Signed)
To cath lab with RN, RR RN and cardiologist.

## 2017-11-29 NOTE — ED Notes (Signed)
Notified edp Equ[employer] trader Autumn results from [employer]

## 2017-11-30 ENCOUNTER — Inpatient Hospital Stay (HOSPITAL_COMMUNITY): Payer: Medicare Other

## 2017-11-30 ENCOUNTER — Encounter (HOSPITAL_COMMUNITY): Payer: Self-pay | Admitting: Cardiology

## 2017-11-30 DIAGNOSIS — I2111 ST elevation (STEMI) myocardial infarction involving right coronary artery: Secondary | ICD-10-CM

## 2017-11-30 DIAGNOSIS — M7989 Other specified soft tissue disorders: Secondary | ICD-10-CM

## 2017-11-30 DIAGNOSIS — I10 Essential (primary) hypertension: Secondary | ICD-10-CM

## 2017-11-30 DIAGNOSIS — I97630 Postprocedural hematoma of a circulatory system organ or structure following a cardiac catheterization: Secondary | ICD-10-CM

## 2017-11-30 DIAGNOSIS — I213 ST elevation (STEMI) myocardial infarction of unspecified site: Secondary | ICD-10-CM

## 2017-11-30 DIAGNOSIS — I2510 Atherosclerotic heart disease of native coronary artery without angina pectoris: Secondary | ICD-10-CM

## 2017-11-30 DIAGNOSIS — I340 Nonrheumatic mitral (valve) insufficiency: Secondary | ICD-10-CM

## 2017-11-30 HISTORY — PX: TRANSTHORACIC ECHOCARDIOGRAM: SHX275

## 2017-11-30 LAB — CBC
HCT: 29.4 % — ABNORMAL LOW (ref 39.0–52.0)
HEMOGLOBIN: 9.8 g/dL — AB (ref 13.0–17.0)
MCH: 34.6 pg — ABNORMAL HIGH (ref 26.0–34.0)
MCHC: 33.3 g/dL (ref 30.0–36.0)
MCV: 103.9 fL — ABNORMAL HIGH (ref 78.0–100.0)
Platelets: 182 10*3/uL (ref 150–400)
RBC: 2.83 MIL/uL — ABNORMAL LOW (ref 4.22–5.81)
RDW: 12.6 % (ref 11.5–15.5)
WBC: 12 10*3/uL — ABNORMAL HIGH (ref 4.0–10.5)

## 2017-11-30 LAB — TROPONIN I
TROPONIN I: 2.69 ng/mL — AB (ref <0.03)
TROPONIN I: 7.33 ng/mL — AB (ref <0.03)
TROPONIN I: 9.3 ng/mL — AB (ref <0.03)

## 2017-11-30 LAB — BASIC METABOLIC PANEL
Anion gap: 5 (ref 5–15)
BUN: 55 mg/dL — ABNORMAL HIGH (ref 6–20)
CO2: 14 mmol/L — ABNORMAL LOW (ref 22–32)
Calcium: 7.7 mg/dL — ABNORMAL LOW (ref 8.9–10.3)
Chloride: 119 mmol/L — ABNORMAL HIGH (ref 101–111)
Creatinine, Ser: 1.84 mg/dL — ABNORMAL HIGH (ref 0.61–1.24)
GFR calc Af Amer: 37 mL/min — ABNORMAL LOW (ref >60)
GFR calc non Af Amer: 32 mL/min — ABNORMAL LOW (ref >60)
Glucose, Bld: 178 mg/dL — ABNORMAL HIGH (ref 65–99)
Potassium: 5.2 mmol/L — ABNORMAL HIGH (ref 3.5–5.1)
Sodium: 138 mmol/L (ref 135–145)

## 2017-11-30 LAB — HEPARIN LEVEL (UNFRACTIONATED)
HEPARIN UNFRACTIONATED: 0.17 [IU]/mL — AB (ref 0.30–0.70)
Heparin Unfractionated: 2.16 IU/mL — ABNORMAL HIGH (ref 0.30–0.70)
Heparin Unfractionated: 1.64 IU/mL — ABNORMAL HIGH (ref 0.30–0.70)

## 2017-11-30 LAB — POCT ACTIVATED CLOTTING TIME
ACTIVATED CLOTTING TIME: 197 s
ACTIVATED CLOTTING TIME: 230 s

## 2017-11-30 LAB — HEMOGLOBIN A1C
Hgb A1c MFr Bld: 5.7 % — ABNORMAL HIGH (ref 4.8–5.6)
Mean Plasma Glucose: 116.89 mg/dL

## 2017-11-30 LAB — LIPID PANEL
Cholesterol: 137 mg/dL (ref 0–200)
HDL: 38 mg/dL — ABNORMAL LOW (ref >40)
LDL Cholesterol: 80 mg/dL (ref 0–99)
Total CHOL/HDL Ratio: 3.6 RATIO
Triglycerides: 93 mg/dL (ref <150)
VLDL: 19 mg/dL (ref 0–40)

## 2017-11-30 LAB — ECHOCARDIOGRAM COMPLETE
Height: 65 in
Weight: 2928 oz

## 2017-11-30 LAB — TSH: TSH: 0.25 u[IU]/mL — ABNORMAL LOW (ref 0.350–4.500)

## 2017-11-30 LAB — MRSA PCR SCREENING: MRSA by PCR: NEGATIVE

## 2017-11-30 MED ORDER — TERAZOSIN HCL 2 MG PO CAPS
4.0000 mg | ORAL_CAPSULE | Freq: Every evening | ORAL | Status: DC
  Administered 2017-11-30 – 2017-12-10 (×9): 4 mg via ORAL
  Filled 2017-11-30 (×12): qty 2

## 2017-11-30 MED ORDER — MORPHINE SULFATE (PF) 2 MG/ML IV SOLN: 2.0000 mg | Freq: Once | INTRAVENOUS | Status: DC

## 2017-11-30 MED ORDER — HEPARIN (PORCINE) IN NACL 100-0.45 UNIT/ML-% IJ SOLN
1200.0000 [IU]/h | INTRAMUSCULAR | Status: DC
  Administered 2017-12-01: 1150 [IU]/h via INTRAVENOUS
  Administered 2017-12-02 – 2017-12-03 (×2): 850 [IU]/h via INTRAVENOUS
  Administered 2017-12-07: 1200 [IU]/h via INTRAVENOUS
  Filled 2017-11-30 (×7): qty 250

## 2017-11-30 MED ORDER — SIMETHICONE 80 MG PO CHEW
80.0000 mg | CHEWABLE_TABLET | Freq: Four times a day (QID) | ORAL | Status: DC | PRN
  Administered 2017-11-30: 80 mg via ORAL
  Filled 2017-11-30 (×2): qty 1

## 2017-11-30 MED ORDER — AMOXICILLIN 500 MG PO CAPS
500.0000 mg | ORAL_CAPSULE | Freq: Three times a day (TID) | ORAL | Status: AC
  Administered 2017-11-30 – 2017-12-02 (×8): 500 mg via ORAL
  Filled 2017-11-30 (×8): qty 1

## 2017-11-30 MED ORDER — SODIUM CHLORIDE 0.9 % IV SOLN
250.0000 mL | INTRAVENOUS | Status: DC | PRN
  Administered 2017-11-30 – 2017-12-01 (×2): 50 mL via INTRAVENOUS

## 2017-11-30 MED ORDER — HEPARIN (PORCINE) IN NACL 100-0.45 UNIT/ML-% IJ SOLN
1000.0000 [IU]/h | INTRAMUSCULAR | Status: DC
  Administered 2017-11-30: 1000 [IU]/h via INTRAVENOUS

## 2017-11-30 MED ORDER — PANTOPRAZOLE SODIUM 40 MG PO TBEC
40.0000 mg | DELAYED_RELEASE_TABLET | Freq: Every day | ORAL | Status: DC
  Administered 2017-11-30 – 2017-12-03 (×4): 40 mg via ORAL
  Filled 2017-11-30 (×4): qty 1

## 2017-11-30 MED ORDER — MORPHINE SULFATE (PF) 2 MG/ML IV SOLN
INTRAVENOUS | Status: AC
  Administered 2017-11-30: 2 mg via INTRAVENOUS
  Filled 2017-11-30: qty 1

## 2017-11-30 NOTE — Progress Notes (Addendum)
2300- pt arrived to 2H08. Oriented to room. Connected to monitor. VSS. CHG bath complete. TR band in place.  0000- Dr. Ellyn Hack at bedside. Will hold Plavix today and tomorrow for potential surgery. Family at bedside. Provided copies of living will and POA. Discussed at length with Dr. Ellyn Hack and family regarding DNR code status outside of OR area.   0100- Dr. Georgette Shell at bedside to speak with pt. Updated on plan of care.     Continue to attempt to remove TR band. Bleeding from insertion site. Reinflated to 12cc. MD Georgette Shell updated regarding bleeding, numbness in hand, and continued swelling/bruising. Will continue to slowly deflate as allowed by bleeding. Heparin gtt remains infusing. WCTM.    0600- MD Lowenstern at bedside. Assessed pt. Spoke with Dr. Ellyn Hack via telephone. Verbal orders received to stop heparin gtt. BP cuff inflated over forearm at DBP while TR band being deflated. Significant increase in swelling and deepening in color of forearm. Still with inability to remove TR band.  0630- Cath lab staff and dr. Ellyn Hack at bedside. Manual pressure being held. Improvement noted.   Cath lab staff to remain at bedside to hold pressure.

## 2017-11-30 NOTE — Consult Note (Addendum)
Hospital Consult    Reason for Consult: Right arm hematoma Referring Physician: Dr. Ellyn Hack MRN #:  [id number]  History of Present Illness: This is a 84 y.o. male presented last night with ST elevation MI and underwent coronary angiography via right radial approach.  This morning approximately 630 he was noted to have hematoma of the right arm and a blood pressure cuff was placed and then he began to have bluish discoloration of his fingers on the right and sensorimotor dysfunction.  Since that time the cough has been taken down manual pressure is been held he is returned sensorimotor function in his right arm and much of the hematoma has been expressed.  He currently has some complaint with a TR band in place but otherwise and feels generally okay  Past Medical History:  Diagnosis Date  . AAA (abdominal aortic aneurysm) ([place])   . Hypercholesteremia   . Hypertension   . Myocardial infarction [facility])    Per OLD EKG per son  . Spinal stenosis   . TIA (transient ischemic attack)     Past Surgical History:  Procedure Laterality Date  . CORONARY BALLOON ANGIOPLASTY N/A 11/29/2017   Procedure: CORONARY BALLOON ANGIOPLASTY;  Surgeon: Leonie Man, MD;  Location: [facility];  Service: Cardiovascular;  Laterality: N/A;  . LEFT HEART CATH AND CORONARY ANGIOGRAPHY N/A 11/29/2017   Procedure: LEFT HEART CATH AND CORONARY ANGIOGRAPHY;  Surgeon: Leonie Man, MD;  Location: [facility];  Service: Cardiovascular;  Laterality: N/A;  . LIPOMA EXCISION    . SPINAL FIXATION SURGERY      Allergies  Allergen Reactions  . Other Shortness Of Breath    Esters  . Contrast Media [Iodinated Diagnostic Agents] Other (See Comments)    Just feel bad all over    Prior to Admission medications   Medication Sig Start Date End Date Taking? Authorizing Provider  allopurinol (ZYLOPRIM) 100 MG tablet Take 100 mg by mouth every morning. 10/08/17  Yes [provider]  amoxicillin  (AMOXIL) 500 MG capsule Take 500 mg by mouth 3 (three) times daily. For 7 days 11/26/17  Yes [provider]  aspirin EC 81 MG tablet Take 81 mg by mouth daily.   Yes [provider]  dexamethasone (DECADRON) 4 MG tablet Take 4 mg by mouth 3 (three) times daily. 11/26/17  Yes [provider]  lisinopril (PRINIVIL,ZESTRIL) 10 MG tablet Take 10 mg by mouth every morning. 10/08/17  Yes [provider]  Multiple Vitamins-Minerals (CENTRUM SILVER PO) Take 1 tablet by mouth daily.   Yes [provider]  simvastatin (ZOCOR) 10 MG tablet Take 10 mg by mouth every evening. 10/08/17  Yes [provider]  terazosin (HYTRIN) 2 MG capsule Take 4 mg by mouth every evening. 10/08/17  Yes [provider]    Social History   Socioeconomic History  . Marital status: Unknown    Spouse name: Not on file  . Number of children: Not on file  . Years of education: Not on file  . Highest education level: Not on file  Occupational History  . Not on file  Social Needs  . Financial resource strain: Not on file  . Food insecurity:    Worry: Not on file    Inability: Not on file  . Transportation needs:    Medical: Not on file    Non-medical: Not on file  Tobacco Use  . Smoking status: Former Research scientist (life sciences)  . Smokeless tobacco: Never Used  Substance and Sexual Activity  . Alcohol use: Yes    Comment: rare  . Drug use: Never  . Sexual activity: Not on file  Lifestyle  . Physical activity:    Days per week: Not on file    Minutes per session: Not on file  . Stress: Not on file  Relationships  . Social connections:    Talks on phone: Not on file    Gets together: Not on file    Attends religious service: Not on file    Active member of club or organization: Not on file    Attends meetings of clubs or organizations: Not on file    Relationship status: Not on file  . Intimate partner violence:    Fear of current or ex partner: Not on file     Emotionally abused: Not on file    Physically abused: Not on file    Forced sexual activity: Not on file  Other Topics Concern  . Not on file  Social History Narrative  . Not on file     No family history on file.  ROS: Not obtained due to urgent nature of consult  Physical Examination  Vitals:   11/30/17 0500 11/30/17 0600  BP: 98/71 127/70  Pulse: 65 64  Resp: 16 17  Temp:    SpO2: 96% 97%   Body mass index is 30.45 kg/m.  General:   White male in no acute distress HENT: WNL, normocephalic Pulmonary: normal non-labored breathing Cardiac: Strong signal at the right ulnar artery Extremities: Right arm has diffuse ecchymosis stops at the wrist and one small skin tear just cephalad to the TR band Neurologic: Right hand is sensory and motor intact although limited by the TR band in place from a motion standpoint.  CBC    Component Value Date/Time   WBC 12.0 (H) 11/30/2017 0510   RBC 2.83 (L) 11/30/2017 0510   HGB 9.8 (L) 11/30/2017 0510   HCT 29.4 (L) 11/30/2017 0510   PLT 182 11/30/2017 0510   MCV 103.9 (H) 11/30/2017 0510   MCH 34.6 (H) 11/30/2017 0510   MCHC 33.3 11/30/2017 0510   RDW 12.6 11/30/2017 0510    BMET    Component Value Date/Time   NA 138 11/30/2017 0510   K 5.2 (H) 11/30/2017 0510   CL 119 (H) 11/30/2017 0510   CO2 14 (L) 11/30/2017 0510   GLUCOSE 178 (H) 11/30/2017 0510   BUN 55 (H) 11/30/2017 0510   CREATININE 1.84 (H) 11/30/2017 0510   CALCIUM 7.7 (L) 11/30/2017 0510   GFRNONAA 32 (L) 11/30/2017 0510   GFRAA 37 (L) 11/30/2017 0510    COAGS: No results found for: INR, PROTIME    ASSESSMENT/PLAN: This is a 84 y.o. male status  post cardiac catheterization for ST elevation MI.  He now has had right arm hematoma and bleeding that is controlled much of the hematoma has been expressed.  He does not have any evidence of compartment at this time and his right hand is sensorimotor intact.  I recommended to hold pressure until bleeding  terminates and I will reevaluate the arm likely place a compressive wrap as well as elevation.  Veronia Laprise C. Donzetta Matters, MD [facility] Office: [phone] Pager: [phone]   Addendum: I rechecked his arm now 3 hours later he has a strong ulnar signal with a small skin tear.  Vaseline gauze was placed over the skin tear.  His compartments are soft his hand is sensorimotor  intact.  I have placed an Ace bandage on his arm and have encouraged using his right upper extremity for activities of daily living and elevating his right upper extremity is much as possible.  No surgical intervention at this time but will be available as needed.  Maeven Mcdougall C. Donzetta Matters

## 2017-11-30 NOTE — Progress Notes (Signed)
Bilateral lower extremity venous duplex has been completed. Negative for DVT.  11/30/17 11:14 AM Paul Kramer RVT

## 2017-11-30 NOTE — Progress Notes (Addendum)
Subjective: Patient was feeling better when seen this morning.  He denies any more chest pain.  Last night he was unable to sleep peacefully because of some abdominal discomfort and increased belching and flatulence.  He was also complaining of mild pain in the right hand and forearm. Patient denied any heart problem with chest pain in the past, only complaining of worsening exertional dyspnea with minimum exertion for the past 2 days and intermittent chest pain.  He also denies any history of A. Fib.  Family was present in the room and participated in the discussion of his care.  Objective:  Vital signs in last 24 hours: Vitals:   11/30/17 0800 11/30/17 0802 11/30/17 0900 11/30/17 1000  BP: 121/70   133/87  Pulse: 75  69 80  Resp: 18  15 17   Temp:  (!) 97.5 F (36.4 C)    TempSrc:  Oral    SpO2: 96%  94% 97%  Weight:      Height:       General.  Well-developed, pleasant elderly man, in no acute distress. Lungs.  Clear bilaterally. CV.  Irregular rhythm with normal rate.  No murmur/rub/gallop. Abdomen.  Soft, nontender, nondistended, bowel sounds positive. Neuro.  Alert and oriented x3, strength and sensations appears symmetrical bilaterally. Extremities.  Edema and bluish discoloration of right forearm extending into hand.  Deep pulses intact and symmetrical bilaterally.  TR band in place at right wrist.  Mild right lower calf tenderness, no lower extremity edema or cyanosis.  Assessment/Plan:  Principal Problem:   Acute ST elevation myocardial infarction (STEMI) (HCC) Active Problems:   Essential hypertension   Hyperlipidemia with target low density lipoprotein (LDL) cholesterol less than 70 mg/dL   New onset a-fib ([place]) - with RVR   CKD (chronic kidney disease) stage 3, GFR 30-59 ml/min (HCC)  STEMI. Paul Kramer presented with acute onset exertional dyspnea and diaphoresis and chest discomfort.  He was initially noted to be in A. fib with subtle EKG changes, however with  recurrence of active chest pain he did have clear 1 to 2 mm inferior elevations as well as some lateral ST elevation.  He was taken to Cath Lab because of these elevations and positive troponin. In the cath lab, LV gram showed inferior/apical hypokinesis. Coronary angiogram with 100% chronically occluded mid RCA. LCx with 95-99% trifurcation lesion at distal vessel, thought to be likely culprit given chronicity of RCA lesion. LAD with 80% proximal lesion and LM 40%. He also developed postprocedural right wrist hematoma with extensive bruising, vascular surgery was consulted and he does not develop any compartment syndrome and his pulses remain intact.  Their recommendation is to Ace wrap and elevate the arm.  Heparin was used on hold because of hematoma. Surgical consult was placed by Paul Kramer because of multiple vessel extensive disease-we will appreciate their recommendations. -Elevate right arm with Ace wrap. -Restart heparin in the afternoon. -Remove TR band. -Continue aspirin and Lipitor. -Home dose of lisinopril 10 mg daily can be restarted once blood pressure allows.  AFib.  Patient remained in A. fib with rate control on amiodarone. -Continue amiodarone and keep monitoring.  CKD.  According to daughter patient has CKD with unknown creatinine baseline. This morning creatinine was 1.84. -We will give gentle fluid with normal saline at 50 mL/h. -Keep monitoring renal function.  Urinary retention.  Patient with a history of BPH, unable to void and found to have more than 500 mL and bladder. -Home dose of terazocin  was started. -In and out catheter. -Keep monitoring, might need Foley if unable to void till later today.   Paul Nimrod, MD 11/30/2017, 10:46 AM Pager: [phone]   Patient seen and examined. Agree with assessment and plan.  Paul Kramer is a very pleasant active 84 year old gentleman who was without any awareness of prior cardiac disease.  He had developed new  onset atrial fibrillation associated with increasing shortness of breath with activity leading to his presentation.  He was found to have dynamic inferolateral STE changes underwent emergent cardiac catheterization yesterday by Paul Kramer.  I personally reviewed his CV images and discussed the findings in detail with Paul Kramer.  I suspect the culprit lesion is the distal circumflex vessel and that the RCA is a chronic total occlusion which has collaterals left to right.  Dr Ellyn Kramer had contacted Paul Kramer last evening for consideration of consultation after discussion today with Paul Kramer may be appropriate to consider two-vessel intervention to the distal circumflex (the culprit vessel) as stenosis involving several branches with subsequent proximal LAD stenting.  Surgical consultation will be obtained today but I anticipate with his underlying comorbidities unless it is felt that the RCA can be bypassed very well that PCI may be recommended.  The patient developed significant ecchymosis from his right radial access site.  There is no evidence for compartment syndrome.  TR band will be removed.  He has intact neurosensory function.  Compression and elevation will be obtained and the patient will be restarted on heparin therapy in light of his atrial fibrillation and circumflex thrombus.  Will await formal surgical consultation prior to ultimate timing of surgical or percutaneous revascularization.  With renal insufficiency, will continue to hydrate aggressively to allow time for renal function improvement prior to any procedures.  With creatinine of 1.84 today, will hold lisinopril.  Patient was started on amiodarone for atrial fibrillation and later today has successfully pharmacologically cardioverted back to sinus rhythm.  We will need to reinstitute terazocin for his prostate.  Plan straight cath with possible need for Foley catheter if significant postvoid residuals.   Paul Sine, MD,  [facility] 11/30/2017 4:43 PM

## 2017-11-30 NOTE — Progress Notes (Signed)
  Echocardiogram 2D Echocardiogram has been performed.  Jannett Celestine 11/30/2017, 9:31 AM

## 2017-11-30 NOTE — Plan of Care (Signed)
  Problem: Education: Goal: Knowledge of General Education information will improve Outcome: Progressing   Problem: Health Behavior/Discharge Planning: Goal: Ability to manage health-related needs will improve Outcome: Progressing   Problem: Clinical Measurements: Goal: Ability to maintain clinical measurements within normal limits will improve Outcome: Progressing Goal: Will remain free from infection Outcome: Progressing Goal: Diagnostic test results will improve Outcome: Progressing Goal: Respiratory complications will improve Outcome: Progressing Goal: Cardiovascular complication will be avoided Outcome: Progressing   Problem: Activity: Goal: Risk for activity intolerance will decrease Outcome: Progressing   Problem: Nutrition: Goal: Adequate nutrition will be maintained Outcome: Progressing   Problem: Coping: Goal: Level of anxiety will decrease Outcome: Progressing   Problem: Elimination: Goal: Will not experience complications related to bowel motility Outcome: Progressing Goal: Will not experience complications related to urinary retention Outcome: Progressing   Problem: Pain Managment: Goal: General experience of comfort will improve Outcome: Progressing   Problem: Safety: Goal: Ability to remain free from injury will improve Outcome: Progressing   Problem: Education: Goal: Understanding of CV disease, CV risk reduction, and recovery process will improve Outcome: Progressing   Problem: Activity: Goal: Ability to return to baseline activity level will improve Outcome: Progressing   Problem: Cardiovascular: Goal: Ability to achieve and maintain adequate cardiovascular perfusion will improve Outcome: Progressing   Problem: Health Behavior/Discharge Planning: Goal: Ability to safely manage health-related needs after discharge will improve Outcome: Progressing   Problem: Education: Goal: Understanding of cardiac disease, CV risk reduction, and  recovery process will improve Outcome: Progressing Goal: Understanding of medication regimen will improve Outcome: Progressing   Problem: Activity: Goal: Ability to tolerate increased activity will improve Outcome: Progressing   Problem: Cardiac: Goal: Ability to achieve and maintain adequate cardiopulmonary perfusion will improve Outcome: Progressing   Problem: Health Behavior/Discharge Planning: Goal: Ability to safely manage health-related needs after discharge will improve Outcome: Progressing   Problem: Skin Integrity: Goal: Risk for impaired skin integrity will decrease Outcome: Not Progressing   Problem: Cardiovascular: Goal: Vascular access site(s) Level 0-1 will be maintained Outcome: Not Met (add Reason) Note:  Pt has experienced a Level 2 hematoma post-cath   Problem: Cardiac: Goal: Vascular access site(s) Level 0-1 will be maintained Outcome: Not Met (add Reason) Note:  Pt has experienced a Level 2 hematoma post-cath

## 2017-11-30 NOTE — Progress Notes (Signed)
1 Day Post-Op Procedure(s) (LRB): LEFT HEART CATH AND CORONARY ANGIOGRAPHY (N/A) CORONARY BALLOON ANGIOPLASTY (N/A) Subjective: Patient examined, coronary angiogram images  and 2D echocardiogram images and chest x-ray image all personally reviewed and discussed with patient  Very nice 84 year old nondiabetic admitted with non-STEMI, new onset A. fib, and history of stage III chronic kidney disease.  Cardiac catheterization performed late last night demonstrates three-vessel CAD with chronic occlusion of the RCA, high-grade stenosis of the proximal LAD, and high-grade stenosis of a large dominant circumflex which is probably the culprit vessel.  Echocardiogram shows moderate AI, moderate MR.  The patient is currently pain-free and in a sinus rhythm.  The patient's cardiac cath site via right radial artery has had persistent bleeding and the arm is now elevated edematous and ecchymotic.  Patient's chest x-ray shows some evidence of interstitial edema. With two-vessel CAD and valvular disease of the aortic and mitral valves with chronic kidney disease creatinine baseline 2.0 at age 67 he would be at very high risk for CABG with possible combined valve surgery.  The patient has had significant bleeding from a uncomplicated radial artery access.  I would recommend that he undergo PCI of his circumflex as a primary target of therapy and to the proximal LAD for secondary target of therapy.  Patient is in agreement and does not wish to have open chest cardiac surgery.  Objective: Vital signs in last 24 hours: Temp:  [97.5 F (36.4 C)-98.9 F (37.2 C)] 98.9 F (37.2 C) (06/03 1241) Pulse Rate:  [44-132] 51 (06/03 1300) Cardiac Rhythm: Sinus bradycardia (06/03 1200) Resp:  [10-30] 17 (06/03 1300) BP: (91-133)/(54-89) 112/62 (06/03 1300) SpO2:  [94 %-100 %] 96 % (06/03 1300) Weight:  [183 lb (83 kg)] 183 lb (83 kg) (06/02 1935)   Exam  Alert and pleasant no acute distress Right arm wrapped with an  Ace wrap elevated with ecchymoses and  edema of the hand Lungs clear Heart rhythm regular without gallop Extremities warm Neuro without focal motor deficit Hemodynamic parameters for last 24 hours:  Currently sinus rhythm history of recent atrial fibrillation  Intake/Output from previous day: 06/02 0701 - 06/03 0700 In: 1873.3 [I.V.:807.7; IV Piggyback:1065.6] Out: -  Intake/Output this shift: Total I/O In: 717.8 [P.O.:450; I.V.:267.8] Out: 800 [Urine:800]    Lab Results: Recent Labs    11/29/17 1934 11/30/17 0510  WBC 11.1* 12.0*  HGB 10.5* 9.8*  HCT 32.3* 29.4*  PLT 194 182   BMET:  Recent Labs    11/29/17 1934 11/30/17 0510  NA 138 138  K 5.2* 5.2*  CL 115* 119*  CO2 13* 14*  GLUCOSE 162* 178*  BUN 52* 55*  CREATININE 1.99* 1.84*  CALCIUM 8.1* 7.7*    PT/INR: No results for input(s): LABPROT, INR in the last 72 hours. ABG No results found for: PHART, HCO3, TCO2, ACIDBASEDEF, O2SAT CBG (last 3)  No results for input(s): GLUCAP in the last 72 hours.  Assessment/Plan: S/P Procedure(s) (LRB): LEFT HEART CATH AND CORONARY ANGIOGRAPHY (N/A) CORONARY BALLOON ANGIOPLASTY (N/A) Patient a poor candidate for surgical revascularization due to advanced age, significant renal insufficiency, moderate valvular disease and bleeding diathesis from radial artery puncture.  Recommend PCI.   LOS: 1 day    Tharon Aquas Trigt III 11/30/2017

## 2017-11-30 NOTE — Progress Notes (Signed)
ANTICOAGULATION CONSULT NOTE - follow up Pharmacy Consult for heparin Indication: atrial fibrillation and ACS  Allergies  Allergen Reactions  . Other Shortness Of Breath    Esters  . Contrast Media [Iodinated Diagnostic Agents] Other (See Comments)    Just feel bad all over  . Morphine And Related Other (See Comments)    Patient states he does not like to take Morphine as it gives him nightmares and hallucinations.     Patient Measurements: Height: 5\' 5"  (165.1 cm) Weight: 183 lb (83 kg) IBW/kg (Calculated) : 61.5 Heparin Dosing Weight: 78kg  Vital Signs: Temp: 97.8 F (36.6 C) (06/03 2050) Temp Source: Oral (06/03 2050) BP: 115/68 (06/03 2100) Pulse Rate: 56 (06/03 2100)  Labs: Recent Labs    11/29/17 1934 11/30/17 0017 11/30/17 0510 11/30/17 1126 11/30/17 1927  HGB 10.5*  --  9.8*  --   --   HCT 32.3*  --  29.4*  --   --   PLT 194  --  182  --   --   HEPARINUNFRC  --  2.16* 1.64*  --  0.17*  CREATININE 1.99*  --  1.84*  --   --   TROPONINI  --  2.69* 7.33* 9.30*  --     Estimated Creatinine Clearance: 29.1 mL/min (A) (by C-G formula based on SCr of 1.84 mg/dL (H)).   Assessment: 45 YOM who presents with SOB and some chest tightness. New AFib on EKG, troponins are also elevated. Patient is not on anticoagulation PTA. Patient has large hematoma on right arm. Elevated heparin level this am on 1300 units/hr.  Heparin held since 6:30 am today. TR band removed approximately 10 am.  Heparin drip resumed today at 14:00, at lower rate of 1000 units/hr.  6 hour heparin level is 0.17 on 1000 units/hr. RN reports hematoma on right arm is stable, no other bleeding noted.     Goal of Therapy:  Heparin level 0.3-0.7 units/ml Monitor platelets by anticoagulation protocol: Yes   Plan:   Increase IV heparin infusion rate to 1150 units/hr Heparin level ~6h after start Daily heparin level and CBC Follow for long term anticoagulation plan   Nicole Cella, RPh Clinical  Pharmacist Please check AMION for all [place] numbers 11/30/2017 9:40 PM

## 2017-11-30 NOTE — Progress Notes (Signed)
Pt unable to void. Bladder scan 331.. Paged md for orders for I/o or foley.

## 2017-11-30 NOTE — Progress Notes (Signed)
Heparin restarted per pharmacy orders. Patients right arm elevated above heart. Radial pulse palpable and dopplerable. Fingers warm to touch, cap refill less than 3 seconds. Pt able to move fingers freely w/o pain. Will cont to monitor for bleeding and swelling.

## 2017-11-30 NOTE — Progress Notes (Signed)
ANTICOAGULATION CONSULT NOTE - Initial Consult  Pharmacy Consult for heparin Indication: atrial fibrillation and ACS  Allergies  Allergen Reactions  . Other Shortness Of Breath    Esters  . Contrast Media [Iodinated Diagnostic Agents] Other (See Comments)    Just feel bad all over    Patient Measurements: Height: 5\' 5"  (165.1 cm) Weight: 183 lb (83 kg) IBW/kg (Calculated) : 61.5 Heparin Dosing Weight: 78kg  Vital Signs: Temp: 98.9 F (37.2 C) (06/03 1241) Temp Source: Oral (06/03 1241) BP: 120/66 (06/03 1115) Pulse Rate: 44 (06/03 1115)  Labs: Recent Labs    11/29/17 1934 11/30/17 0017 11/30/17 0510 11/30/17 1126  HGB 10.5*  --  9.8*  --   HCT 32.3*  --  29.4*  --   PLT 194  --  182  --   HEPARINUNFRC  --  2.16* 1.64*  --   CREATININE 1.99*  --  1.84*  --   TROPONINI  --  2.69* 7.33* 9.30*    Estimated Creatinine Clearance: 29.1 mL/min (A) (by C-G formula based on SCr of 1.84 mg/dL (H)).   Assessment: 78 YOM who presents with SOB and some chest tightness. New AFib on EKG, troponins are also elevated.  Patient has large hematoma on right arm. Elevated heparin level this am.  Heparin held since 6:30 am today. TR band removed approximately 10 am.  Patient is not on anticoagulation PTA.  Goal of Therapy:  Heparin level 0.3-0.7 units/ml Monitor platelets by anticoagulation protocol: Yes   Plan:   Restart heparin with no bolus, start infusion at 1000 units/hr Heparin level 8h after start Daily heparin level and CBC Follow for long term anticoagulation plan  Laqueta Linden, PharmD, [facility] Clinical Pharmacist Please check AMION for all [place] numbers 11/30/2017 12:43 PM

## 2017-11-30 NOTE — Plan of Care (Signed)
  Problem: Education: Goal: Knowledge of General Education information will improve Outcome: Progressing   Problem: Health Behavior/Discharge Planning: Goal: Ability to manage health-related needs will improve Outcome: Progressing   Problem: Clinical Measurements: Goal: Ability to maintain clinical measurements within normal limits will improve Outcome: Progressing Goal: Will remain free from infection Outcome: Progressing Goal: Diagnostic test results will improve Outcome: Progressing Goal: Respiratory complications will improve Outcome: Progressing Goal: Cardiovascular complication will be avoided Outcome: Progressing   Problem: Activity: Goal: Risk for activity intolerance will decrease Outcome: Progressing   Problem: Nutrition: Goal: Adequate nutrition will be maintained Outcome: Progressing   Problem: Coping: Goal: Level of anxiety will decrease Outcome: Progressing   Problem: Elimination: Goal: Will not experience complications related to bowel motility Outcome: Progressing Goal: Will not experience complications related to urinary retention Outcome: Progressing   Problem: Pain Managment: Goal: General experience of comfort will improve Outcome: Progressing   Problem: Safety: Goal: Ability to remain free from injury will improve Outcome: Progressing   Problem: Skin Integrity: Goal: Risk for impaired skin integrity will decrease Outcome: Progressing   Problem: Education: Goal: Understanding of CV disease, CV risk reduction, and recovery process will improve Outcome: Progressing   Problem: Activity: Goal: Ability to return to baseline activity level will improve Outcome: Progressing   Problem: Cardiovascular: Goal: Ability to achieve and maintain adequate cardiovascular perfusion will improve Outcome: Progressing Goal: Vascular access site(s) Level 0-1 will be maintained Outcome: Progressing   Problem: Health Behavior/Discharge Planning: Goal:  Ability to safely manage health-related needs after discharge will improve Outcome: Progressing   

## 2017-12-01 LAB — CBC
HCT: 26.3 % — ABNORMAL LOW (ref 39.0–52.0)
Hemoglobin: 8.7 g/dL — ABNORMAL LOW (ref 13.0–17.0)
MCH: 34 pg (ref 26.0–34.0)
MCHC: 33.1 g/dL (ref 30.0–36.0)
MCV: 102.7 fL — ABNORMAL HIGH (ref 78.0–100.0)
PLATELETS: 169 10*3/uL (ref 150–400)
RBC: 2.56 MIL/uL — ABNORMAL LOW (ref 4.22–5.81)
RDW: 12.8 % (ref 11.5–15.5)
WBC: 10.8 10*3/uL — ABNORMAL HIGH (ref 4.0–10.5)

## 2017-12-01 LAB — BASIC METABOLIC PANEL
Anion gap: 10 (ref 5–15)
BUN: 60 mg/dL — AB (ref 6–20)
CALCIUM: 7.7 mg/dL — AB (ref 8.9–10.3)
CO2: 14 mmol/L — AB (ref 22–32)
Chloride: 113 mmol/L — ABNORMAL HIGH (ref 101–111)
Creatinine, Ser: 1.96 mg/dL — ABNORMAL HIGH (ref 0.61–1.24)
GFR calc Af Amer: 34 mL/min — ABNORMAL LOW (ref >60)
GFR, EST NON AFRICAN AMERICAN: 29 mL/min — AB (ref >60)
Glucose, Bld: 147 mg/dL — ABNORMAL HIGH (ref 65–99)
Potassium: 5.4 mmol/L — ABNORMAL HIGH (ref 3.5–5.1)
Sodium: 137 mmol/L (ref 135–145)

## 2017-12-01 LAB — HEPARIN LEVEL (UNFRACTIONATED)
Heparin Unfractionated: 0.53 IU/mL (ref 0.30–0.70)
Heparin Unfractionated: 0.98 IU/mL — ABNORMAL HIGH (ref 0.30–0.70)

## 2017-12-01 MED ORDER — AMIODARONE HCL 200 MG PO TABS
200.0000 mg | ORAL_TABLET | Freq: Every day | ORAL | Status: DC
  Administered 2017-12-01 – 2017-12-03 (×3): 200 mg via ORAL
  Filled 2017-12-01 (×3): qty 1

## 2017-12-01 MED ORDER — MAGNESIUM HYDROXIDE 400 MG/5ML PO SUSP: 30.0000 mL | Freq: Every day | ORAL | Status: DC | PRN

## 2017-12-01 MED ORDER — SODIUM CHLORIDE 0.9 % IV SOLN
INTRAVENOUS | Status: DC
  Administered 2017-12-01: 13:00:00 via INTRAVENOUS

## 2017-12-01 NOTE — Plan of Care (Signed)
  Problem: Education: Goal: Knowledge of General Education information will improve Outcome: Progressing   Problem: Health Behavior/Discharge Planning: Goal: Ability to manage health-related needs will improve Outcome: Progressing   Problem: Clinical Measurements: Goal: Ability to maintain clinical measurements within normal limits will improve Outcome: Progressing Goal: Will remain free from infection Outcome: Progressing Goal: Diagnostic test results will improve Outcome: Progressing Goal: Respiratory complications will improve Outcome: Progressing Goal: Cardiovascular complication will be avoided Outcome: Progressing   Problem: Activity: Goal: Risk for activity intolerance will decrease Outcome: Progressing   Problem: Nutrition: Goal: Adequate nutrition will be maintained Outcome: Progressing   Problem: Coping: Goal: Level of anxiety will decrease Outcome: Progressing   Problem: Elimination: Goal: Will not experience complications related to bowel motility Outcome: Progressing Goal: Will not experience complications related to urinary retention Outcome: Progressing   Problem: Pain Managment: Goal: General experience of comfort will improve Outcome: Progressing   Problem: Safety: Goal: Ability to remain free from injury will improve Outcome: Progressing   Problem: Skin Integrity: Goal: Risk for impaired skin integrity will decrease Outcome: Progressing   Problem: Education: Goal: Understanding of CV disease, CV risk reduction, and recovery process will improve Outcome: Progressing   Problem: Activity: Goal: Ability to return to baseline activity level will improve Outcome: Progressing   Problem: Cardiovascular: Goal: Ability to achieve and maintain adequate cardiovascular perfusion will improve Outcome: Progressing Goal: Vascular access site(s) Level 0-1 will be maintained Outcome: Progressing   Problem: Health Behavior/Discharge Planning: Goal:  Ability to safely manage health-related needs after discharge will improve Outcome: Progressing   Problem: Education: Goal: Understanding of cardiac disease, CV risk reduction, and recovery process will improve Outcome: Progressing Goal: Understanding of medication regimen will improve Outcome: Progressing   Problem: Activity: Goal: Ability to tolerate increased activity will improve Outcome: Progressing   Problem: Cardiac: Goal: Ability to achieve and maintain adequate cardiopulmonary perfusion will improve Outcome: Progressing Goal: Vascular access site(s) Level 0-1 will be maintained Outcome: Progressing   Problem: Health Behavior/Discharge Planning: Goal: Ability to safely manage health-related needs after discharge will improve Outcome: Progressing

## 2017-12-01 NOTE — Progress Notes (Signed)
Pt hoping for stent today. Began ed with pt and family. Good reception. He swims 8 laps at [facility] 2-4x a week. Discussed CRPII and will send referral to [place] however pt sts he does not like to be in crowds. [id number] [place], ACSM 10:29 AM 12/01/2017

## 2017-12-01 NOTE — Progress Notes (Signed)
Patient denies generalized pain in right arm. Swelling has improved, good radial/ ulnar pulses. Patient denies numbness or pain with movement and is able to perform serial fine motor movements.

## 2017-12-01 NOTE — Progress Notes (Signed)
Foley catheter was inserted at 2108 by Nurse Extern Kathleene Hazel. This RN was present to assist. Patient was educated about need for insertion and procedure prior to insertion. Procedure was followed for proper insertion and sterile technique maintained. Patient experienced mild discomfort during insertion and emotional support was given to the patient. Upon insertion 300 cc was emptied from the bladder, foley was anchored and patient was repositioned in the bed.

## 2017-12-01 NOTE — Plan of Care (Signed)
  Problem: Education: Goal: Knowledge of General Education information will improve Outcome: Progressing   Problem: Health Behavior/Discharge Planning: Goal: Ability to manage health-related needs will improve Outcome: Progressing   Problem: Clinical Measurements: Goal: Ability to maintain clinical measurements within normal limits will improve Outcome: Progressing Goal: Will remain free from infection Outcome: Progressing Goal: Diagnostic test results will improve Outcome: Progressing Goal: Respiratory complications will improve Outcome: Progressing Goal: Cardiovascular complication will be avoided Outcome: Progressing   Problem: Activity: Goal: Risk for activity intolerance will decrease Outcome: Progressing   Problem: Nutrition: Goal: Adequate nutrition will be maintained Outcome: Progressing   Problem: Coping: Goal: Level of anxiety will decrease Outcome: Progressing   Problem: Elimination: Goal: Will not experience complications related to bowel motility Outcome: Progressing Goal: Will not experience complications related to urinary retention Outcome: Progressing   Problem: Pain Managment: Goal: General experience of comfort will improve Outcome: Progressing   Problem: Safety: Goal: Ability to remain free from injury will improve Outcome: Progressing   Problem: Education: Goal: Understanding of CV disease, CV risk reduction, and recovery process will improve Outcome: Progressing   Problem: Activity: Goal: Ability to return to baseline activity level will improve Outcome: Progressing   Problem: Cardiovascular: Goal: Ability to achieve and maintain adequate cardiovascular perfusion will improve Outcome: Progressing   Problem: Health Behavior/Discharge Planning: Goal: Ability to safely manage health-related needs after discharge will improve Outcome: Progressing   Problem: Education: Goal: Understanding of cardiac disease, CV risk reduction, and  recovery process will improve Outcome: Progressing Goal: Understanding of medication regimen will improve Outcome: Progressing   Problem: Activity: Goal: Ability to tolerate increased activity will improve Outcome: Progressing   Problem: Cardiac: Goal: Ability to achieve and maintain adequate cardiopulmonary perfusion will improve Outcome: Progressing   Problem: Health Behavior/Discharge Planning: Goal: Ability to safely manage health-related needs after discharge will improve Outcome: Progressing

## 2017-12-01 NOTE — Progress Notes (Addendum)
Progress Note  Patient Name: Paul Kramer Date of Encounter: 12/01/2017  Primary Cardiologist: new, Ellyn Hack  Subjective   No recurrent chest pain; Patient was resting comfortably when seen this morning.  Family was present in the room.  He denies any more chest pain. A Foley was placed because of unable to void last night. Patient has family was well aware about the plan that he is not a good candidate for a open heart surgery and possibly will be getting PCI. His right arm swelling and pain is improving.   Inpatient Medications    Scheduled Meds: . allopurinol  100 mg Oral BH-q7a  . amoxicillin  500 mg Oral TID  . aspirin  81 mg Oral Daily  . atorvastatin  80 mg Oral q1800  . metoprolol tartrate  12.5 mg Oral BID  .  morphine injection  2 mg Intravenous Once  .  morphine injection  2 mg Intravenous Once  . pantoprazole  40 mg Oral Q0600  . sodium chloride flush  3 mL Intravenous Q12H  . terazosin  4 mg Oral QPM   Continuous Infusions: . sodium chloride 50 mL (12/01/17 0822)  . amiodarone 30 mg/hr (12/01/17 0600)  . heparin 1,150 Units/hr (12/01/17 0600)   PRN Meds: sodium chloride, acetaminophen, nitroGLYCERIN, ondansetron (ZOFRAN) IV, ondansetron (ZOFRAN) IV, simethicone, sodium chloride flush   Vital Signs    Vitals:   12/01/17 0800 12/01/17 0900 12/01/17 1000 12/01/17 1100  BP: 134/68 (!) 123/59 (!) 89/57 (!) 126/59  Pulse: (!) 52 (!) 44 (!) 44 (!) 43  Resp: 17 14 16 16   Temp:      TempSrc:      SpO2: 97% 96% 98% 99%  Weight:      Height:        Intake/Output Summary (Last 24 hours) at 12/01/2017 1157 Last data filed at 12/01/2017 1000 Gross per 24 hour  Intake 2229.21 ml  Output 610 ml  Net 1619.21 ml    I/O since admission: +3026.8  Filed Weights   11/29/17 1935  Weight: 183 lb (83 kg)    Telemetry    Sinus Bradycardia  at 46 - Personally Reviewed  ECG    ECG (independently read by me): SB at 51; T wave inversion inferiorly.  Physical  Exam   BP (!) 126/59   Pulse (!) 43   Temp (!) 96.3 F (35.7 C) (Axillary)   Resp 16   Ht 5\' 5"  (1.651 m)   Wt 183 lb (83 kg)   SpO2 99%   BMI 30.45 kg/m  General: Alert, oriented, no distress.  Skin: extensive ecchymosis R arm; neurosensory intact. HEENT: Normocephalic, atraumatic. Pupils equal round and reactive to light; sclera anicteric; extraocular muscles intact;  Nose without nasal septal hypertrophy Mouth/Parynx benign; Mallinpatti scale 3 Neck: No JVD, no carotid bruits; normal carotid upstroke Lungs: clear to ausculatation and percussion; no wheezing or rales Chest wall: without tenderness to palpitation Heart: PMI not displaced, RRR, s1 s2 normal, 1/6 systolic murmur, no diastolic murmur, no rubs, gallops, thrills, or heaves Abdomen: soft, nontender; no hepatosplenomehaly, BS+; abdominal aorta nontender and not dilated by palpation. Back: no CVA tenderness Pulses 2+ Musculoskeletal: full range of motion, normal strength, no joint deformities Extremities: no clubbing cyanosis or edema, Homan's sign negative  Neurologic: grossly nonfocal; Cranial nerves grossly wnl Psychologic: Normal mood and affect   Labs    Chemistry Recent Labs  Lab 11/29/17 1934 11/30/17 0510 12/01/17 0513  NA 138 138 137  K  5.2* 5.2* 5.4*  CL 115* 119* 113*  CO2 13* 14* 14*  GLUCOSE 162* 178* 147*  BUN 52* 55* 60*  CREATININE 1.99* 1.84* 1.96*  CALCIUM 8.1* 7.7* 7.7*  GFRNONAA 29* 32* 29*  GFRAA 34* 37* 34*  ANIONGAP 10 5 10      Hematology Recent Labs  Lab 11/29/17 1934 11/30/17 0510 12/01/17 0513  WBC 11.1* 12.0* 10.8*  RBC 3.06* 2.83* 2.56*  HGB 10.5* 9.8* 8.7*  HCT 32.3* 29.4* 26.3*  MCV 105.6* 103.9* 102.7*  MCH 34.3* 34.6* 34.0  MCHC 32.5 33.3 33.1  RDW 12.7 12.6 12.8  PLT 194 182 169    Cardiac Enzymes Recent Labs  Lab 11/30/17 0017 11/30/17 0510 11/30/17 1126  TROPONINI 2.69* 7.33* 9.30*    Recent Labs  Lab 11/29/17 1940  TROPIPOC 1.84*     BNPNo  results for input(s): BNP, PROBNP in the last 168 hours.   DDimer No results for input(s): DDIMER in the last 168 hours.   Lipid Panel     Component Value Date/Time   CHOL 137 11/30/2017 0017   TRIG 93 11/30/2017 0017   HDL 38 (L) 11/30/2017 0017   CHOLHDL 3.6 11/30/2017 0017   VLDL 19 11/30/2017 0017   LDLCALC 80 11/30/2017 0017    Radiology    Dg Chest 2 View  Result Date: 11/29/2017 CLINICAL DATA:  Worsening shortness of breath with chest tightness. EXAM: CHEST - 2 VIEW COMPARISON:  None. FINDINGS: The cardiac silhouette is normal in size. Aortic atherosclerosis is noted. There are small calcified mediastinal and hilar lymph nodes suggesting prior granulomatous infection. The lungs are normally to mildly hyperinflated without evidence of airspace consolidation, edema, pleural effusion, pneumothorax. No acute osseous abnormality is seen. IMPRESSION: No active cardiopulmonary disease. Electronically Signed   By: Logan Bores M.D.   On: 11/29/2017 20:12    Cardiac Studies   11/29/2017 Emergent Cardiac Cath Conclusion     There is mild left ventricular systolic dysfunction.  The left ventricular ejection fraction is 45-50% by visual estimate.  Prox RCA to Mid RCA lesion is 80% stenosed. Mid RCA to Dist RCA lesion is 100% stenosed with 95% stenosed side branch in Post Atrio.  Ost LM to Mid LM lesion is 40% stenosed.  Balloon angioplasty was attempted using a BALLOON SAPPHIRE 2.0X12. -- was unable to cross the 100% stenosis & no reduction in 80% lesion.  Mid Cx to Dist Cx lesion is 95% stenosed (thrombotic) with 55% stenosed side branch in Ost 2nd Mrg. -- LIKELY THE CULPRIT LESION  Ost LAD to Prox LAD lesion is 85% stenosed.    Severe three-vessel disease with 100% CTO mid RCA, severe 95% thrombotic lesion trifurcation lesion of the distal Cx-OM 2-LPL 1 and proximal LAD 85% eccentric lesion.  Unable to cross RCA lesion confirming CTO (PDA fills via collaterals from septal  perforators)  Mildly reduced LVEF of roughly 45 to 50% with inferoapical hypokinesis  Since the patient was chest pain-free on medications, and the RCA appears to be chronically occluded with complex trifurcation circumflex lesion as a likely culprit, I felt it more prudent to stop at this point to allow time for further clarification of the best course of action going forward.  His CKD would also warrant staged PCI if indicated as opposed to continued contrast use.  --Classically this would be surgical disease, however could also be done with two-vessel PCI and medical management of the RCA which appears to be chronic.  My impression is that  his best option is probably two-vessel PCI, however we have consulted CT surgery.  Plan: Admit to ICU, restart IV heparin TR band removal per protocol;   IV amiodarone for atrial fib .Marland Kitchen  Continue Cangrelor infusion x 2 hr  Gentle hydration  CT surgery has been consulted, however after discussing with the patient's daughter and son-in-law, they do not feel that he would be amenable to CABG.  If this is clearly decision, we will go ahead and load with Plavix 300 mg and go ahead and schedule for two-vessel PCI on Tuesday.  Otherwise will await CT surgical consult on Monday for final decision. ? The Cx-LPL lesion is a complicated trifurcation lesion, but would attempt at main vessel stenting into the distal Cx-LPL across the OM branch with provisional PTCA/PCI of the OM.  Statin and beta-blocker added  2D echo ordered     ------------------------------------------------------------------- 11/30/2017 ECHO Study Conclusions  - Left ventricle: The cavity size was normal. There was mild focal   basal hypertrophy of the septum. Systolic function was mildly   reduced. The estimated ejection fraction was in the range of 45%   to 50%. There is hypokinesis of the inferior myocardium. There   was no evidence of elevated ventricular filling pressure by    Doppler parameters. - Aortic valve: Valve mobility was restricted. There was moderate   regurgitation. - Mitral valve: There was mild to moderate regurgitation. - Right ventricle: The cavity size was mildly dilated. Wall   thickness was normal.  Patient Profile     84 y.o. male who presented with acute onset exertional dyspnea and diaphoresis and chest discomfort. He was initially noted to be in A. fib with subtle EKG changes, however with recurrence of active chest pain he developed 1 to 2 mm inferior elevations as well as some lateral ST elevation.He was taken to Cath Lab because of these elevations and positive troponin.  Assessment & Plan    STEMI. LV gram showed inferior/apical hypokinesis. Coronary angiogram with 100% chronically occluded mid RCA. LCx with 95-99% trifurcation lesion at distal vessel, thought to be likely culprit given chronicity of RCA lesion. LAD with 80% proximal lesion and LM 40%. Cardiothoracic surgery does not think that he is a good candidate for open heart surgery and advised a PCI to left circumflex and proximal LAD. His postprocedural right wrist hematoma is improving, hemoglobin dropped to 8.7 today from 9.8 yesterday.  No sign of active bleeding.  Heparin was resumed yesterday. Patient is n.p.o. today for a possible procedure. Patient converted back to sinus rhythm yesterday. Little softer blood pressure. -PCI to left circumflex and proximal LAD. -Continue giving fluid because of CKD. -Continue elevating right with Ace wrap. -Continue aspirin and Lipitor.  AFib.  Converted back to sinus rhythm with rate in 40s today,on amiodarone infusion. Will dc iv amiodarone and initiate oral amiodarone at 200 mg daily (rather than 400 mg with bradycardia)  CKD.  Cr. at 1.96 today with baseline around 2. Continue low dose iv hydration in hope for renal fxn stability prior to attempt at PCI. -Daily BMP - K 5.4 today; not on meds; will re-check later  today  Urinary retention.Patient with a history of BPH, Foley was placed last night because of his inability to void. -Continue home dose of terazosin.  Anemia.  Patient hemoglobin dropped to 8.7 today, it was 10.5 on admission.  He developed a large hematoma after cath.  Denies any hematuria or hematochezia. He is having elevated MCV.  Baseline  hemoglobin unknown, most likely some element of anemia of chronic illness because of his CKD. -Monitor CBC. -Check for any B12 and folate deficiency because of elevated MCV. -We will also check iron and ferritin level because of recent hematoma.   I had a long discussion with patient and family and reviewed note from Dr. Nils Pyle. No recurrent anginal symptoms.  Agree best option is for planned 2 vessel intervention to LCX complex trifucation lesion which was the culprit vessel and LAD PCI.  ? Timing in ~ 2 days if continues to stabilize. Family and patient fully aware.   Rollene Rotunda, MD  Troy Sine, MD, [facility] 12/01/2017, 11:57 AM

## 2017-12-01 NOTE — Progress Notes (Signed)
ANTICOAGULATION CONSULT NOTE - follow up Pharmacy Consult for heparin Indication: atrial fibrillation and ACS  Allergies  Allergen Reactions  . Other Shortness Of Breath    Esters  . Contrast Media [Iodinated Diagnostic Agents] Other (See Comments)    Just feel bad all over  . Morphine And Related Other (See Comments)    Patient states he does not like to take Morphine as it gives him nightmares and hallucinations.     Patient Measurements: Height: 5\' 5"  (165.1 cm) Weight: 183 lb (83 kg) IBW/kg (Calculated) : 61.5 Heparin Dosing Weight: 78kg  Vital Signs: Temp: 97.7 F (36.5 C) (06/04 1100) Temp Source: Axillary (06/04 0433) BP: 126/59 (06/04 1100) Pulse Rate: 43 (06/04 1100)  Labs: Recent Labs    11/29/17 1934  11/30/17 0017 11/30/17 0510 11/30/17 1126 11/30/17 1927 12/01/17 0513 12/01/17 1126  HGB 10.5*  --   --  9.8*  --   --  8.7*  --   HCT 32.3*  --   --  29.4*  --   --  26.3*  --   PLT 194  --   --  182  --   --  169  --   HEPARINUNFRC  --    < > 2.16* 1.64*  --  0.17* 0.53 0.98*  CREATININE 1.99*  --   --  1.84*  --   --  1.96*  --   TROPONINI  --   --  2.69* 7.33* 9.30*  --   --   --    < > = values in this interval not displayed.    Estimated Creatinine Clearance: 27.3 mL/min (A) (by C-G formula based on SCr of 1.96 mg/dL (H)).   Assessment: 78 YOM who presents with SOB and some chest tightness. New AFib on EKG, troponins are also elevated. Patient is not on anticoagulation PTA. Patient has large hematoma on right arm. Elevated heparin level this am on 1300 units/hr.  Heparin held since 6:30 am today. TR band removed approximately 10 am.  Heparin drip resumed today at 14:00, at lower rate of 1000 units/hr.  6 hour heparin level is 0.17 on 1000 units/hr. RN reports hematoma on right arm is stable, no other bleeding noted.  HL this 0.98    Goal of Therapy:  Heparin level 0.3-0.7 units/ml Monitor platelets by anticoagulation protocol: Yes   Plan:    Decrease IV heparin infusion rate to 1000 units/hr Recheck HL in 8 hours Daily heparin level and CBC Follow for long term anticoagulation plan   Excell Seltzer, PharmD Clinical Pharmacist 12/01/2017 12:59 PM

## 2017-12-01 NOTE — Progress Notes (Signed)
ANTICOAGULATION CONSULT NOTE - follow up Pharmacy Consult for heparin Indication: atrial fibrillation and ACS  Allergies  Allergen Reactions  . Other Shortness Of Breath    Esters  . Contrast Media [Iodinated Diagnostic Agents] Other (See Comments)    Just feel bad all over  . Morphine And Related Other (See Comments)    Patient states he does not like to take Morphine as it gives him nightmares and hallucinations.     Patient Measurements: Height: 5\' 5"  (165.1 cm) Weight: 183 lb (83 kg) IBW/kg (Calculated) : 61.5 Heparin Dosing Weight: 78kg  Vital Signs: Temp: 96.3 F (35.7 C) (06/04 0433) Temp Source: Axillary (06/04 0433) BP: 124/64 (06/04 0300) Pulse Rate: 53 (06/04 0300)  Labs: Recent Labs    11/29/17 1934  11/30/17 0017 11/30/17 0510 11/30/17 1126 11/30/17 1927 12/01/17 0513  HGB 10.5*  --   --  9.8*  --   --  8.7*  HCT 32.3*  --   --  29.4*  --   --  26.3*  PLT 194  --   --  182  --   --  169  HEPARINUNFRC  --    < > 2.16* 1.64*  --  0.17* 0.53  CREATININE 1.99*  --   --  1.84*  --   --   --   TROPONINI  --   --  2.69* 7.33* 9.30*  --   --    < > = values in this interval not displayed.    Estimated Creatinine Clearance: 29.1 mL/min (A) (by C-G formula based on SCr of 1.84 mg/dL (H)).   Assessment: 86 YOM who presents with SOB and some chest tightness. New AFib on EKG, troponins are also elevated. Patient is not on anticoagulation PTA. Patient has large hematoma on right arm. Elevated heparin level this am on 1300 units/hr.  Heparin held since 6:30 am today. TR band removed approximately 10 am.  Heparin drip resumed today at 14:00, at lower rate of 1000 units/hr.  6 hour heparin level is 0.17 on 1000 units/hr. RN reports hematoma on right arm is stable, no other bleeding noted.  HL this am 0.58    Goal of Therapy:  Heparin level 0.3-0.7 units/ml Monitor platelets by anticoagulation protocol: Yes   Plan:   Cont IV heparin infusion rate at 1150  units/hr Daily heparin level and CBC Follow for long term anticoagulation plan   Excell Seltzer, PharmD Clinical Pharmacist 12/01/2017 6:16 AM

## 2017-12-02 DIAGNOSIS — D7589 Other specified diseases of blood and blood-forming organs: Secondary | ICD-10-CM

## 2017-12-02 DIAGNOSIS — I2121 ST elevation (STEMI) myocardial infarction involving left circumflex coronary artery: Secondary | ICD-10-CM

## 2017-12-02 DIAGNOSIS — Z955 Presence of coronary angioplasty implant and graft: Secondary | ICD-10-CM

## 2017-12-02 LAB — BASIC METABOLIC PANEL
ANION GAP: 6 (ref 5–15)
BUN: 57 mg/dL — ABNORMAL HIGH (ref 6–20)
CO2: 17 mmol/L — ABNORMAL LOW (ref 22–32)
Calcium: 7.7 mg/dL — ABNORMAL LOW (ref 8.9–10.3)
Chloride: 115 mmol/L — ABNORMAL HIGH (ref 101–111)
Creatinine, Ser: 2 mg/dL — ABNORMAL HIGH (ref 0.61–1.24)
GFR, EST AFRICAN AMERICAN: 33 mL/min — AB (ref >60)
GFR, EST NON AFRICAN AMERICAN: 29 mL/min — AB (ref >60)
Glucose, Bld: 132 mg/dL — ABNORMAL HIGH (ref 65–99)
POTASSIUM: 4.9 mmol/L (ref 3.5–5.1)
SODIUM: 138 mmol/L (ref 135–145)

## 2017-12-02 LAB — CBC
HCT: 28.4 % — ABNORMAL LOW (ref 39.0–52.0)
Hemoglobin: 9.5 g/dL — ABNORMAL LOW (ref 13.0–17.0)
MCH: 34.5 pg — AB (ref 26.0–34.0)
MCHC: 33.5 g/dL (ref 30.0–36.0)
MCV: 103.3 fL — ABNORMAL HIGH (ref 78.0–100.0)
PLATELETS: 169 10*3/uL (ref 150–400)
RBC: 2.75 MIL/uL — AB (ref 4.22–5.81)
RDW: 12.8 % (ref 11.5–15.5)
WBC: 8.6 10*3/uL (ref 4.0–10.5)

## 2017-12-02 LAB — OCCULT BLOOD X 1 CARD TO LAB, STOOL: Fecal Occult Bld: POSITIVE — AB

## 2017-12-02 LAB — IRON AND TIBC
IRON: 77 ug/dL (ref 45–182)
SATURATION RATIOS: 47 % — AB (ref 17.9–39.5)
TIBC: 162 ug/dL — AB (ref 250–450)
UIBC: 85 ug/dL

## 2017-12-02 LAB — VITAMIN B12: Vitamin B-12: 302 pg/mL (ref 180–914)

## 2017-12-02 LAB — FERRITIN: FERRITIN: 313 ng/mL (ref 24–336)

## 2017-12-02 LAB — HEPARIN LEVEL (UNFRACTIONATED)
HEPARIN UNFRACTIONATED: 0.32 [IU]/mL (ref 0.30–0.70)
Heparin Unfractionated: >2.2 IU/mL — ABNORMAL HIGH (ref 0.30–0.70)
Heparin Unfractionated: 0.73 IU/mL — ABNORMAL HIGH (ref 0.30–0.70)
Heparin Unfractionated: 0.33 IU/mL (ref 0.30–0.70)

## 2017-12-02 MED ORDER — FUROSEMIDE 10 MG/ML IJ SOLN
INTRAMUSCULAR | Status: AC
  Administered 2017-12-02: 20 mg via INTRAVENOUS
  Filled 2017-12-02: qty 2

## 2017-12-02 MED ORDER — VITAMIN B-12 1000 MCG PO TABS
1000.0000 ug | ORAL_TABLET | Freq: Every day | ORAL | Status: DC
  Administered 2017-12-02 – 2017-12-11 (×10): 1000 ug via ORAL
  Filled 2017-12-02 (×10): qty 1

## 2017-12-02 MED ORDER — FUROSEMIDE 10 MG/ML IJ SOLN
20.0000 mg | Freq: Once | INTRAMUSCULAR | Status: AC
  Administered 2017-12-02: 20 mg via INTRAVENOUS

## 2017-12-02 MED FILL — Nitroglycerin IV Soln 100 MCG/ML in D5W: INTRA_ARTERIAL | Qty: 10 | Status: AC

## 2017-12-02 NOTE — Progress Notes (Signed)
ANTICOAGULATION CONSULT NOTE - Follow Up  Pharmacy Consult for Heparin Indication: atrial fibrillation and ACS  Allergies  Allergen Reactions  . Other Shortness Of Breath    Esters  . Contrast Media [Iodinated Diagnostic Agents] Other (See Comments)    Just feel bad all over  . Morphine And Related Other (See Comments)    Patient states he does not like to take Morphine as it gives him nightmares and hallucinations.     Patient Measurements: Height: 5\' 5"  (165.1 cm) Weight: 199 lb 8.3 oz (90.5 kg) IBW/kg (Calculated) : 61.5 Heparin Dosing Weight: 78kg  Vital Signs: Temp: 97.7 F (36.5 C) (06/05 1617) Temp Source: Oral (06/05 1617) BP: 101/46 (06/05 1900) Pulse Rate: 50 (06/05 1900)  Labs: Recent Labs    11/30/17 0017  11/30/17 0510 11/30/17 1126  12/01/17 0513  12/02/17 0109 12/02/17 0948 12/02/17 1922  HGB  --    < > 9.8*  --   --  8.7*  --  9.5*  --   --   HCT  --   --  29.4*  --   --  26.3*  --  28.4*  --   --   PLT  --   --  182  --   --  169  --  169  --   --   HEPARINUNFRC 2.16*  --  1.64*  --    < > 0.53   < > 0.73* 0.32 0.33  CREATININE  --   --  1.84*  --   --  1.96*  --  2.00*  --   --   TROPONINI 2.69*  --  7.33* 9.30*  --   --   --   --   --   --    < > = values in this interval not displayed.    Estimated Creatinine Clearance: 27.9 mL/min (A) (by C-G formula based on SCr of 2 mg/dL (H)).   Assessment: 46 YOM who presents with SOB and some chest tightness. New AFib on EKG, troponins are also elevated. Patient is not on anticoagulation PTA. Patient has large hematoma on right arm and FOB+ stool.    Heparin level 0.33 units/ml on 850 units/hr. Will keep on low end of therapeutic goal given hematoma and FOB+.  Goal of Therapy:  Heparin level 0.3-0.7 units/ml Monitor platelets by anticoagulation protocol: Yes   Plan:   Continue IV heparin infusion at 850 units/hr Next heparin level with AM labs.  [employer], Pharm.D., BCPS Clinical  Pharmacist 12/02/2017 8:19 PM

## 2017-12-02 NOTE — Progress Notes (Addendum)
Progress Note  Patient Name: Paul Kramer Date of Encounter: 12/02/2017  Primary Cardiologist: Ellyn Hack  Subjective   Patient was sitting in chair when seen this morning.  Denies any chest pain or shortness of breath.  He is well aware about future PCI in couple of days. He had a small amount of bleeding from his right forearm last night requiring a dressing change.  Denies any more pain or tingling or numbness in the right arm.  Inpatient Medications    Scheduled Meds: . allopurinol  100 mg Oral BH-q7a  . amiodarone  200 mg Oral Daily  . amoxicillin  500 mg Oral TID  . aspirin  81 mg Oral Daily  . atorvastatin  80 mg Oral q1800  . metoprolol tartrate  12.5 mg Oral BID  .  morphine injection  2 mg Intravenous Once  .  morphine injection  2 mg Intravenous Once  . pantoprazole  40 mg Oral Q0600  . sodium chloride flush  3 mL Intravenous Q12H  . terazosin  4 mg Oral QPM   Continuous Infusions: . heparin 850 Units/hr (12/02/17 0900)   PRN Meds: acetaminophen, magnesium hydroxide, nitroGLYCERIN, ondansetron (ZOFRAN) IV, ondansetron (ZOFRAN) IV, simethicone, sodium chloride flush   Vital Signs    Vitals:   12/02/17 0700 12/02/17 0754 12/02/17 0800 12/02/17 0900  BP: 133/60  (!) 141/83 (!) 126/53  Pulse: (!) 54  61 (!) 58  Resp: 19  18 15   Temp:  98.7 F (37.1 C) 98.7 F (37.1 C)   TempSrc:  Oral Oral   SpO2: 96%  96% 96%  Weight:      Height:        Intake/Output Summary (Last 24 hours) at 12/02/2017 0959 Last data filed at 12/02/2017 0900 Gross per 24 hour  Intake 2334.75 ml  Output 1725 ml  Net 609.75 ml    I/O since admission: +3480  Filed Weights   11/29/17 1935 12/02/17 0630  Weight: 183 lb (83 kg) 199 lb 8.3 oz (90.5 kg)    Telemetry     - Personally Reviewed.  Normal sinus rhythm.  ECG    ECG (independently read by me): No EKG today.  Physical Exam   Physical Exam BP (!) 126/53 (BP Location: Left Arm)   Pulse (!) 58   Temp 98.7 F (37.1 C)  (Oral)   Resp 15   Ht 5\' 5"  (1.651 m)   Wt 199 lb 8.3 oz (90.5 kg)   SpO2 96%   BMI 33.20 kg/m  General: Alert, oriented, no distress.  Skin: normal turgor, no rashes, warm and dry HEENT: Normocephalic, atraumatic. Pupils equal round and reactive to light; sclera anicteric; Neck:  JVD, no carotid bruits; normal carotid upstroke Lungs: clear to ausculatation and percussion; no wheezing or rales Chest wall: without tenderness to palpitation Heart: PMI not displaced, RRR, s1 s2 normal, 1/6 systolic murmur, no diastolic murmur, no rubs, gallops, thrills, or heaves Abdomen: soft, nontender; no hepatosplenomehaly, BS+; abdominal aorta nontender and not dilated by palpation. Pulses 2+ Extremities: Trace pedal edema ,Homan's sign negative  Neurologic: grossly nonfocal; Cranial nerves grossly wnl Psychologic: Normal mood and affect     Labs    Chemistry Recent Labs  Lab 11/30/17 0510 12/01/17 0513 12/02/17 0109  NA 138 137 138  K 5.2* 5.4* 4.9  CL 119* 113* 115*  CO2 14* 14* 17*  GLUCOSE 178* 147* 132*  BUN 55* 60* 57*  CREATININE 1.84* 1.96* 2.00*  CALCIUM 7.7* 7.7* 7.7*  GFRNONAA 32* 29* 29*  GFRAA 37* 34* 33*  ANIONGAP 5 10 6      Hematology Recent Labs  Lab 11/30/17 0510 12/01/17 0513 12/02/17 0109  WBC 12.0* 10.8* 8.6  RBC 2.83* 2.56* 2.75*  HGB 9.8* 8.7* 9.5*  HCT 29.4* 26.3* 28.4*  MCV 103.9* 102.7* 103.3*  MCH 34.6* 34.0 34.5*  MCHC 33.3 33.1 33.5  RDW 12.6 12.8 12.8  PLT 182 169 169    Cardiac Enzymes Recent Labs  Lab 11/30/17 0017 11/30/17 0510 11/30/17 1126  TROPONINI 2.69* 7.33* 9.30*    Recent Labs  Lab 11/29/17 1940  TROPIPOC 1.84*     BNPNo results for input(s): BNP, PROBNP in the last 168 hours.   DDimer No results for input(s): DDIMER in the last 168 hours.   Lipid Panel     Component Value Date/Time   CHOL 137 11/30/2017 0017   TRIG 93 11/30/2017 0017   HDL 38 (L) 11/30/2017 0017   CHOLHDL 3.6 11/30/2017 0017   VLDL 19  11/30/2017 0017   LDLCALC 80 11/30/2017 0017    Radiology    No results found.  Cardiac Studies   11/29/2017 Emergent Cardiac Cath Conclusion     There is mild left ventricular systolic dysfunction.  The left ventricular ejection fraction is 45-50% by visual estimate.  Prox RCA to Mid RCA lesion is 80% stenosed. Mid RCA to Dist RCA lesion is 100% stenosed with 95% stenosed side branch in Post Atrio.  Ost LM to Mid LM lesion is 40% stenosed.  Balloon angioplasty was attempted using a BALLOON SAPPHIRE 2.0X12. -- was unable to cross the 100% stenosis & no reduction in 80% lesion.  Mid Cx to Dist Cx lesion is 95% stenosed (thrombotic) with 55% stenosed side branch in Ost 2nd Mrg. -- LIKELY THE CULPRIT LESION  Ost LAD to Prox LAD lesion is 85% stenosed.   Severe three-vessel disease with 100% CTO mid RCA, severe 95% thrombotic lesion trifurcation lesion of the distal Cx-OM 2-LPL 1 and proximal LAD 85% eccentric lesion.  Unable to cross RCA lesion confirming CTO (PDA fills via collaterals from septal perforators)  Mildly reduced LVEF of roughly 45 to 50% with inferoapical hypokinesis  Since the patient was chest pain-free on medications, and the RCA appears to be chronically occluded with complex trifurcation circumflex lesion as a likely culprit, I felt it more prudent to stop at this point to allow time for further clarification of the best course of action going forward. His CKD would also warrant staged PCI if indicated as opposed to continued contrast use.  --Classically this would be surgical disease, however could also be done with two-vessel PCI and medical management of the RCA which appears to be chronic. My impression is that his best option is probably two-vessel PCI, however we have consulted CT surgery.  Plan: Admit to ICU, restart IV heparin TR band removal per protocol;   IV amiodarone for atrial fib .Marland Kitchen  Continue Cangrelor infusion x 2 hr  Gentle  hydration  CT surgery has been consulted, however after discussing with the patient's daughter and son-in-law, they do not feel that he would be amenable to CABG. If this is clearly decision, we will go ahead and load with Plavix 300 mg and go ahead and schedule for two-vessel PCI on Tuesday. Otherwise will await CT surgical consult on Monday for final decision. ? The Cx-LPL lesion is a complicated trifurcation lesion, but would attempt at main vessel stenting into the distal Cx-LPL across the  OM branch with provisional PTCA/PCI of the OM.  Statin and beta-blocker added  2D echo ordered     ------------------------------------------------------------------- 11/30/2017 ECHO Study Conclusions  - Left ventricle: The cavity size was normal. There was mild focal basal hypertrophy of the septum. Systolic function was mildly reduced. The estimated ejection fraction was in the range of 45% to 50%. There is hypokinesis of the inferior myocardium. There was no evidence of elevated ventricular filling pressure by Doppler parameters. - Aortic valve: Valve mobility was restricted. There was moderate regurgitation. - Mitral valve: There was mild to moderate regurgitation. - Right ventricle: The cavity size was mildly dilated. Wall thickness was normal.  Patient Profile     84 y.o. male who presented with acute onset exertional dyspnea and diaphoresis and chest discomfort. He was initially noted to be in A. fib with subtle EKG changes, however with recurrence of active chest pain he developed 1 to 2 mm inferior elevations as well as some lateral ST elevation.He was taken to Cath Lab because of these elevations and positive troponin.  Assessment & Plan    STEMI.LV gram showed inferior/apical hypokinesis. Coronary angiogram with 100% chronically occluded mid RCA. LCx with 95-99% trifurcation lesion at distal vessel, thought to be likely culprit given chronicity of RCA lesion. LAD  with 80% proximal lesion and LM 40%. Cardiothoracic surgery does not think that he is a good candidate for open heart surgery and advised a PCIto left circumflex and proximal LAD, which will be done in couple of days after renal functions stabilized. His postprocedural right wrist hematoma is improving.No sign of active bleeding. He developed new onset trace pedal edema and positive JVD, weight was 199 today which was increased from 183 on June 2 on the day of admission.  He was given gentle fluid because of his right heart involvement. -Discontinue normal saline. -We will give 20 mg of IV Lasix and monitor as patient never used Lasix before. -Continue elevating right with Ace wrap. -Continue aspirin and Lipitor.  AFib.Converted back to sinus rhythm with rate in 60s today.  Amiodarone infusion was discontinued yesterday and he was started on p.o. amiodarone 200 mg daily.  -Continue amiodarone 200 mg daily.  CKD.Cr.at 2.00 today with baseline around 2, slight increase from yesterday of 1.96.  Most likely secondary to being volume overloaded. -Continue IV fluid. -Lasix 20 mg IV and monitor his response, might need another dose. -Daily BMP  Hyperkalemia.  Resolved today with potassium of 4.3.  Urinary retention.Patient with a history of BPH,Foley was placed because of his inability to void. -Continue home dose of terazosin.  Anemia.Patient hemoglobin dropped to 9.5 today, improved from yesterday's office 8.7. No sign of any active bleeding.He developed a large hematoma after cath.Denies any hematuria or hematochezia.  Baseline hemoglobin around 10.5. He is having elevated MCV. B12 is 302.  Ferritin at 313 -Monitor CBC. -Might get benefit with vitamin B12 supplement.    Signed,  [name], MD.PGY2   Patient seen and examined. Agree with assessment and plan. No recurrent chest pain. Had mild JVD earlier this am; improved with 20 mg Lasix. No rales on exam K  improved.  Cr increased today at 2.0. Will not plan for PCI tomorrow and tentatively schedule for Friday depending upon renal fxn. With MCV 103 will add oral B12. Maintaining sinus rhythm, now on reduced dose of amiodarone 200 mg daily.  Good movement of R arm and hand without signs of compartment syndrome. Good pulse. F/U labs in am.  Troy Sine, MD, [facility] 12/02/2017 11:37 AM

## 2017-12-02 NOTE — Progress Notes (Signed)
[place] for Heparin Indication: atrial fibrillation and ACS  Allergies  Allergen Reactions  . Other Shortness Of Breath    Esters  . Contrast Media [Iodinated Diagnostic Agents] Other (See Comments)    Just feel bad all over  . Morphine And Related Other (See Comments)    Patient states he does not like to take Morphine as it gives him nightmares and hallucinations.     Patient Measurements: Height: 5\' 5"  (165.1 cm) Weight: 183 lb (83 kg) IBW/kg (Calculated) : 61.5 Heparin Dosing Weight: 78kg  Vital Signs: Temp: 97.4 F (36.3 C) (06/05 0015) Temp Source: Oral (06/05 0015) BP: 119/60 (06/05 0000) Pulse Rate: 54 (06/05 0000)  Labs: Recent Labs    11/29/17 1934  11/30/17 0017 11/30/17 0510 11/30/17 1126  12/01/17 0513 12/01/17 1126 12/01/17 2242 12/02/17 0109  HGB 10.5*  --   --  9.8*  --   --  8.7*  --   --  9.5*  HCT 32.3*  --   --  29.4*  --   --  26.3*  --   --  28.4*  PLT 194  --   --  182  --   --  169  --   --  169  HEPARINUNFRC  --    < > 2.16* 1.64*  --    < > 0.53 0.98* >2.20* 0.73*  CREATININE 1.99*  --   --  1.84*  --   --  1.96*  --   --   --   TROPONINI  --   --  2.69* 7.33* 9.30*  --   --   --   --   --    < > = values in this interval not displayed.    Estimated Creatinine Clearance: 27.3 mL/min (A) (by C-G formula based on SCr of 1.96 mg/dL (H)).   Assessment: 76 YOM who presents with SOB and some chest tightness. New AFib on EKG, troponins are also elevated. Patient is not on anticoagulation PTA. Patient has large hematoma on right arm. Elevated heparin level this am on 1300 units/hr.  Heparin held since 6:30 am today. TR band removed approximately 10 am.  Heparin drip resumed at lower rate of 1000 units/hr.    6/5 AM update: heparin level of >2.2 was an error,re-drawn was more accurate at 0.73, hematoma stable, Hgb 9.5    Goal of Therapy:  Heparin level 0.3-0.7 units/ml Monitor platelets by  anticoagulation protocol: Yes   Plan:   Decrease IV heparin infusion to 850 units/hr Recheck HL in 8 hours  Narda Bonds, PharmD, [employer] Pharmacist Phone: [phone]

## 2017-12-02 NOTE — Progress Notes (Signed)
CARDIAC REHAB PHASE I   PRE:  Rate/Rhythm: 57 SB    BP: sitting 116/53    SaO2: 95 RA  MODE:  Ambulation: 110 ft   POST:  Rate/Rhythm: 74 SR    BP: sitting 131/70     SaO2: 98 RA  Pt tired of sitting/not moving. Able to stand and walk with RW. Fairly steady, slightly weak. To BSC after walking. HR had gotten to 74 SR. Denied any c/o. Will f/u. [id number]   [place], ACSM 12/02/2017 11:00 AM

## 2017-12-02 NOTE — Progress Notes (Signed)
[place] for Heparin Indication: atrial fibrillation and ACS  Allergies  Allergen Reactions  . Other Shortness Of Breath    Esters  . Contrast Media [Iodinated Diagnostic Agents] Other (See Comments)    Just feel bad all over  . Morphine And Related Other (See Comments)    Patient states he does not like to take Morphine as it gives him nightmares and hallucinations.     Patient Measurements: Height: 5\' 5"  (165.1 cm) Weight: 199 lb 8.3 oz (90.5 kg) IBW/kg (Calculated) : 61.5 Heparin Dosing Weight: 78kg  Vital Signs: Temp: 97.5 F (36.4 C) (06/05 1131) Temp Source: Oral (06/05 1131) BP: 117/59 (06/05 1100) Pulse Rate: 62 (06/05 1100)  Labs: Recent Labs    11/30/17 0017 11/30/17 0510 11/30/17 1126  12/01/17 0513  12/01/17 2242 12/02/17 0109 12/02/17 0948  HGB  --  9.8*  --   --  8.7*  --   --  9.5*  --   HCT  --  29.4*  --   --  26.3*  --   --  28.4*  --   PLT  --  182  --   --  169  --   --  169  --   HEPARINUNFRC 2.16* 1.64*  --    < > 0.53   < > >2.20* 0.73* 0.32  CREATININE  --  1.84*  --   --  1.96*  --   --  2.00*  --   TROPONINI 2.69* 7.33* 9.30*  --   --   --   --   --   --    < > = values in this interval not displayed.    Estimated Creatinine Clearance: 27.9 mL/min (A) (by C-G formula based on SCr of 2 mg/dL (H)).   Assessment: 59 YOM who presents with SOB and some chest tightness. New AFib on EKG, troponins are also elevated. Patient is not on anticoagulation PTA. Patient has large hematoma on right arm. Elevated heparin level this am on 1300 units/hr.  Heparin held since 6:30 am today. TR band removed approximately 10 am.  Heparin drip resumed at lower rate of 1000 units/hr.   Heparin level 0.32 units/ml  Goal of Therapy:  Heparin level 0.3-0.7 units/ml Monitor platelets by anticoagulation protocol: Yes   Plan:   Continue IV heparin infusion at 850 units/hr Recheck HL in 8 hours  Alanda Slim, PharmD,  [facility] Clinical Pharmacist

## 2017-12-03 ENCOUNTER — Inpatient Hospital Stay (HOSPITAL_COMMUNITY): Payer: Medicare Other

## 2017-12-03 DIAGNOSIS — N184 Chronic kidney disease, stage 4 (severe): Secondary | ICD-10-CM

## 2017-12-03 LAB — URINALYSIS, ROUTINE W REFLEX MICROSCOPIC
BILIRUBIN URINE: NEGATIVE
Bacteria, UA: NONE SEEN
Glucose, UA: NEGATIVE mg/dL
Ketones, ur: NEGATIVE mg/dL
LEUKOCYTES UA: NEGATIVE
NITRITE: NEGATIVE
PROTEIN: NEGATIVE mg/dL
Specific Gravity, Urine: 1.01 (ref 1.005–1.030)
pH: 5 (ref 5.0–8.0)

## 2017-12-03 LAB — CBC
HEMATOCRIT: 26.3 % — AB (ref 39.0–52.0)
Hemoglobin: 8.8 g/dL — ABNORMAL LOW (ref 13.0–17.0)
MCH: 33.7 pg (ref 26.0–34.0)
MCHC: 33.5 g/dL (ref 30.0–36.0)
MCV: 100.8 fL — ABNORMAL HIGH (ref 78.0–100.0)
PLATELETS: 155 10*3/uL (ref 150–400)
RBC: 2.61 MIL/uL — ABNORMAL LOW (ref 4.22–5.81)
RDW: 12.2 % (ref 11.5–15.5)
WBC: 6.6 10*3/uL (ref 4.0–10.5)

## 2017-12-03 LAB — BASIC METABOLIC PANEL
Anion gap: 5 (ref 5–15)
BUN: 49 mg/dL — ABNORMAL HIGH (ref 6–20)
CALCIUM: 7.7 mg/dL — AB (ref 8.9–10.3)
CO2: 20 mmol/L — AB (ref 22–32)
CREATININE: 2.08 mg/dL — AB (ref 0.61–1.24)
Chloride: 113 mmol/L — ABNORMAL HIGH (ref 101–111)
GFR calc non Af Amer: 27 mL/min — ABNORMAL LOW (ref >60)
GFR, EST AFRICAN AMERICAN: 32 mL/min — AB (ref >60)
Glucose, Bld: 100 mg/dL — ABNORMAL HIGH (ref 65–99)
Potassium: 5.1 mmol/L (ref 3.5–5.1)
Sodium: 138 mmol/L (ref 135–145)

## 2017-12-03 LAB — FOLATE RBC
Folate, Hemolysate: 434.7 ng/mL
Folate, RBC: 1509 ng/mL (ref >498)
HEMATOCRIT: 28.8 % — AB (ref 37.5–51.0)

## 2017-12-03 LAB — PSA: Prostatic Specific Antigen: 8.4 ng/mL — ABNORMAL HIGH (ref 0.00–4.00)

## 2017-12-03 LAB — GLUCOSE, CAPILLARY: Glucose-Capillary: 91 mg/dL (ref 65–99)

## 2017-12-03 LAB — BRAIN NATRIURETIC PEPTIDE: B Natriuretic Peptide: 201.8 pg/mL — ABNORMAL HIGH (ref 0.0–100.0)

## 2017-12-03 MED ORDER — AMIODARONE HCL 200 MG PO TABS
100.0000 mg | ORAL_TABLET | Freq: Every day | ORAL | Status: DC
  Administered 2017-12-04 – 2017-12-11 (×8): 100 mg via ORAL
  Filled 2017-12-03 (×8): qty 1

## 2017-12-03 MED ORDER — DARBEPOETIN ALFA 100 MCG/0.5ML IJ SOSY
100.0000 ug | PREFILLED_SYRINGE | INTRAMUSCULAR | Status: DC
  Administered 2017-12-04 – 2017-12-11 (×2): 100 ug via SUBCUTANEOUS
  Filled 2017-12-03 (×3): qty 0.5

## 2017-12-03 MED ORDER — FUROSEMIDE 10 MG/ML IJ SOLN: 20.0000 mg | Freq: Once | INTRAMUSCULAR | Status: DC

## 2017-12-03 NOTE — Progress Notes (Signed)
[place] for Heparin Indication: atrial fibrillation and ACS  Allergies  Allergen Reactions  . Other Shortness Of Breath    Esters  . Contrast Media [Iodinated Diagnostic Agents] Other (See Comments)    Just feel bad all over  . Morphine And Related Other (See Comments)    Patient states he does not like to take Morphine as it gives him nightmares and hallucinations.     Patient Measurements: Height: 5\' 5"  (165.1 cm) Weight: 199 lb 8.3 oz (90.5 kg) IBW/kg (Calculated) : 61.5 Heparin Dosing Weight: 78kg  Vital Signs: Temp: 97.9 F (36.6 C) (06/06 0748) Temp Source: Oral (06/06 0748) BP: 121/53 (06/06 0748) Pulse Rate: 50 (06/06 0748)  Labs: Recent Labs    11/30/17 1126  12/01/17 0513  12/02/17 0109 12/02/17 0948 12/02/17 1922 12/03/17 0302  HGB  --    < > 8.7*  --  9.5*  --   --  8.8*  HCT  --   --  26.3*  --  28.4*  --   --  26.3*  PLT  --   --  169  --  169  --   --  155  HEPARINUNFRC  --    < > 0.53   < > 0.73* 0.32 0.33  --   CREATININE  --   --  1.96*  --  2.00*  --   --  2.08*  TROPONINI 9.30*  --   --   --   --   --   --   --    < > = values in this interval not displayed.    Estimated Creatinine Clearance: 26.8 mL/min (A) (by C-G formula based on SCr of 2.08 mg/dL (H)).   Assessment: 72 YOM who presents with SOB and some chest tightness. New AFib on EKG, troponins are also elevated. Patient is not on anticoagulation PTA. He has large hematoma on right arm post cath and also noted FOBT positive. Plans noted for cath on Friday -Hg= 8.8 -heparin level = 0.33   Goal of Therapy:  Heparin level 0.3-0.7 units/ml Monitor platelets by anticoagulation protocol: Yes   Plan:   -No heparin changes needed -Daily heparin level and CBC  Hildred Laser, PharmD Clinical Pharmacist Clinical phone from 8:30-4:00 is [phone] After 4pm, please call Main [facility] [phone]) for assistance. 12/03/2017 8:54 AM

## 2017-12-03 NOTE — Progress Notes (Addendum)
Progress Note  Patient Name: Paul Kramer Date of Encounter: 12/03/2017  Primary Cardiologist: [place]   Patient has no complaints when seen this morning.  He was able to ambulate down the hall without being short of breath or any complains of chest pain yesterday. Right arm continue to improve, although seems quite edematous right hand. Patient denies any orthopnea or PND, he was sleeping on the bed with head raised up to 30 degree.  Inpatient Medications    Scheduled Meds: . allopurinol  100 mg Oral BH-q7a  . amiodarone  200 mg Oral Daily  . aspirin  81 mg Oral Daily  . atorvastatin  80 mg Oral q1800  . metoprolol tartrate  12.5 mg Oral BID  .  morphine injection  2 mg Intravenous Once  .  morphine injection  2 mg Intravenous Once  . pantoprazole  40 mg Oral Q0600  . sodium chloride flush  3 mL Intravenous Q12H  . terazosin  4 mg Oral QPM  . vitamin B-12  1,000 mcg Oral Daily   Continuous Infusions: . heparin 850 Units/hr (12/02/17 1600)   PRN Meds: acetaminophen, magnesium hydroxide, nitroGLYCERIN, ondansetron (ZOFRAN) IV, ondansetron (ZOFRAN) IV, simethicone, sodium chloride flush   Vital Signs    Vitals:   12/03/17 0300 12/03/17 0400 12/03/17 0500 12/03/17 0748  BP: (!) 106/58 (!) 101/49  (!) 121/53  Pulse: (!) 51 (!) 48 (!) 50 (!) 50  Resp: 15 15 18 13   Temp:  98.6 F (37 C)  97.9 F (36.6 C)  TempSrc:  Oral  Oral  SpO2: 96% 94% 93% 95%  Weight:      Height:        Intake/Output Summary (Last 24 hours) at 12/03/2017 0848 Last data filed at 12/03/2017 0700 Gross per 24 hour  Intake 245.5 ml  Output 2275 ml  Net -2029.5 ml    I/O since admission:   Filed Weights   11/29/17 1935 12/02/17 0630  Weight: 183 lb (83 kg) 199 lb 8.3 oz (90.5 kg)    Telemetry     - Personally Reviewed.  Remained in sinus rhythm with borderline bradycardia.  ECG    ECG (independently read by me): No EKG today.  Physical Exam    BP (!) 121/53   Pulse  (!) 50   Temp 97.9 F (36.6 C) (Oral)   Resp 13   Ht 5\' 5"  (1.651 m)   Wt 199 lb 8.3 oz (90.5 kg)   SpO2 95%   BMI 33.20 kg/m  General: Alert, oriented, no distress.  Skin: normal turgor, no rashes, warm and dry HEENT: Normocephalic, atraumatic. Pupils equal round and reactive to light; sclera anicteric;  Neck: JVD, no carotid bruits; normal carotid upstroke Lungs: Decreased breath sound at bases; no wheezing or rales Chest wall: without tenderness to palpitation Heart: PMI not displaced, RRR, s1 s2 normal, 1/6 systolic murmur, no diastolic murmur, no rubs, gallops, thrills, or heaves Abdomen: soft, nontender; no hepatosplenomehaly, BS+; abdominal aorta nontender and not dilated by palpation. Pulses 2+ Extremities: Trace pedal edema, improved little as compared to yesterday. Homan's sign negative  Neurologic: grossly nonfocal; Cranial nerves grossly wnl Psychologic: Normal mood and affect     Labs    Chemistry Recent Labs  Lab 12/01/17 0513 12/02/17 0109 12/03/17 0302  NA 137 138 138  K 5.4* 4.9 5.1  CL 113* 115* 113*  CO2 14* 17* 20*  GLUCOSE 147* 132* 100*  BUN 60* 57* 49*  CREATININE 1.96*  2.00* 2.08*  CALCIUM 7.7* 7.7* 7.7*  GFRNONAA 29* 29* 27*  GFRAA 34* 33* 32*  ANIONGAP 10 6 5      Hematology Recent Labs  Lab 12/01/17 0513 12/02/17 0109 12/03/17 0302  WBC 10.8* 8.6 6.6  RBC 2.56* 2.75* 2.61*  HGB 8.7* 9.5* 8.8*  HCT 26.3* 28.4* 26.3*  MCV 102.7* 103.3* 100.8*  MCH 34.0 34.5* 33.7  MCHC 33.1 33.5 33.5  RDW 12.8 12.8 12.2  PLT 169 169 155    Cardiac Enzymes Recent Labs  Lab 11/30/17 0017 11/30/17 0510 11/30/17 1126  TROPONINI 2.69* 7.33* 9.30*    Recent Labs  Lab 11/29/17 1940  TROPIPOC 1.84*     BNPNo results for input(s): BNP, PROBNP in the last 168 hours.   DDimer No results for input(s): DDIMER in the last 168 hours.   Lipid Panel     Component Value Date/Time   CHOL 137 11/30/2017 0017   TRIG 93 11/30/2017 0017   HDL 38  (L) 11/30/2017 0017   CHOLHDL 3.6 11/30/2017 0017   VLDL 19 11/30/2017 0017   LDLCALC 80 11/30/2017 0017    Radiology    No results found.  Cardiac Studies   11/29/2017 Emergent Cardiac CathConclusion     There is mild left ventricular systolic dysfunction.  The left ventricular ejection fraction is 45-50% by visual estimate.  Prox RCA to Mid RCA lesion is 80% stenosed. Mid RCA to Dist RCA lesion is 100% stenosed with 95% stenosed side branch in Post Atrio.  Ost LM to Mid LM lesion is 40% stenosed.  Balloon angioplasty was attempted using a BALLOON SAPPHIRE 2.0X12. -- was unable to cross the 100% stenosis & no reduction in 80% lesion.  Mid Cx to Dist Cx lesion is 95% stenosed (thrombotic) with 55% stenosed side branch in Ost 2nd Mrg. -- LIKELY THE CULPRIT LESION  Ost LAD to Prox LAD lesion is 85% stenosed.   Severe three-vessel disease with 100% CTO mid RCA, severe 95% thrombotic lesion trifurcation lesion of the distal Cx-OM 2-LPL 1 and proximal LAD 85% eccentric lesion.  Unable to cross RCA lesion confirming CTO (PDA fills via collaterals from septal perforators)  Mildly reduced LVEF of roughly 45 to 50% with inferoapical hypokinesis  Since the patient was chest pain-free on medications, and the RCA appears to be chronically occluded with complex trifurcation circumflex lesion as a likely culprit, I felt it more prudent to stop at this point to allow time for further clarification of the best course of action going forward. His CKD would also warrant staged PCI if indicated as opposed to continued contrast use.  --Classically this would be surgical disease, however could also be done with two-vessel PCI and medical management of the RCA which appears to be chronic. My impression is that his best option is probably two-vessel PCI, however we have consulted CT surgery.  Plan: Admit to ICU, restart IV heparin TR band removal per protocol;   IV amiodarone for atrial  fib .Marland Kitchen  Continue Cangrelor infusion x 2 hr  Gentle hydration  CT surgery has been consulted, however after discussing with the patient's daughter and son-in-law, they do not feel that he would be amenable to CABG. If this is clearly decision, we will go ahead and load with Plavix 300 mg and go ahead and schedule for two-vessel PCI on Tuesday. Otherwise will await CT surgical consult on Monday for final decision. ? The Cx-LPL lesion is a complicated trifurcation lesion, but would attempt at main vessel  stenting into the distal Cx-LPL across the OM branch with provisional PTCA/PCI of the OM.  Statin and beta-blocker added  2D echo ordered     ------------------------------------------------------------------- 11/30/2017 ECHOStudy Conclusions  - Left ventricle: The cavity size was normal. There was mild focal basal hypertrophy of the septum. Systolic function was mildly reduced. The estimated ejection fraction was in the range of 45% to 50%. There is hypokinesis of the inferior myocardium. There was no evidence of elevated ventricular filling pressure by Doppler parameters. - Aortic valve: Valve mobility was restricted. There was moderate regurgitation. - Mitral valve: There was mild to moderate regurgitation. - Right ventricle: The cavity size was mildly dilated. Wall thickness was normal.  Patient Profile     84 y.o. male whopresented with acute onset exertional dyspnea and diaphoresis and chest discomfort. He was initially noted to be in A. fib with subtle EKG changes, however with recurrence of active chest pain he developed1 to 2 mm inferior elevations as well as some lateral ST elevation.He was taken to Cath Lab because of these elevations and positive troponin.  Assessment & Plan    STEMI.LV gram showed inferior/apical hypokinesis. Coronary angiogram with 100% chronically occluded mid RCA. LCx with 95-99% trifurcation lesion at distal vessel, thought to  be likely culprit given chronicity of RCA lesion. LAD with 80% proximal lesion and LM 40%. Cardiothoracic surgery does not think that he is a good candidate for open heart surgery and advised a PCIto left circumflex and proximal LAD, which will be done in couple of days after renal functions stabilized. His postprocedural right wrist hematoma is improving.No sign of active bleeding. He continued to have mild JVD and trace pedal edema although improved as compared to yesterday.  Weight was 198 today, just 1 pound decreased from yesterday of 199. -? give another dose of 20 mg  IV Lasix and monitor. -Continue elevating right with Ace wrap. -Continue aspirin and Lipitor. _BNP.  AFib.Converted back to sinus rhythm with rate in 50s today.  -Decreased amiodarone to 100 mg daily. -No room for beta-blocker at this time because of borderline bradycardia.  CKD.Cr.at 2.08 today with baseline around 2, slight increase from yesterday of 2.00.  Most likely secondary to being volume overloaded. -Continue IV fluid. -Lasix 20 mg IV and monitor his response. -Daily BMP -Renal ultrasound. -UA -Consult nephrology.   Hyperkalemia.  Resolved today with potassium of 5.1.  Urinary retention.Patient with a history of BPH,Foley was placed because of his inability to void. -Continue home dose of terazosin.  Anemia.Patient hemoglobin dropped to 8.8 today,  decreased from yesterday's hemoglobin of 9.5.  Found to have FOBT positive.No sign of any active bleeding.He developed a large hematoma after cath.Denies any hematuria or hematochezia.  Baseline hemoglobin around 10.5. patient is on heparin.  Iron studies consistent with anemia of chronic illness, vitamin B12 borderline low. -Monitor CBC. -Continue B12 supplement. -Add Protonix 40 mg  daily.  SignedLorella Nimrod MD, PGY2 12/03/2017, 8:48 AM    Patient seen and examined. Agree with assessment and plan. No recurrent chest pain.  Maintaining sinus rhythm but bradycardic in high 40-50s. Will decrease amiodarone to 100 mg daily.  Cr increased to 2.09.  No shortness of breath. Edema improved. Will assess BNP. With previous increased PVR prior to foley (~500 cc) will check renal US.  Also check PSA.  Will ask renal to see.  At present will not schedule for PCI tomorrow unless renal Fxn improved and may need ultimate staged PCI to [facility]  and LCx, Continue to be on heparin with subtotal LCX stenosis as culprit vessel. Check CXR today.   Troy Sine, MD, [facility] 12/03/2017 11:19 AM

## 2017-12-03 NOTE — Progress Notes (Signed)
CARDIAC REHAB PHASE I   PRE:  Rate/Rhythm: 51 SB  BP:  Sitting: 111/50      SaO2: 99 RA  MODE:  Ambulation: 150 ft 64 peak HR  POST:  Rate/Rhythm: 54 SB  BP:  Sitting: 121/52    SaO2: 98 RA   Pt eager to walk. Pt ambulated 147ft, standby assist with RW. Pt took multiple standing rest breaks c/o slight SOB and lightheadedness. Pt wanted to continue walking, pt educated that these are signs his body his trying to tell him that it was time to rest. Pt agreeable and sat back in recliner. Will continue to f/u, pt pending PCI on undetermined date.  [place], RN BSN 12/03/2017 2:20 PM

## 2017-12-03 NOTE — Care Management Note (Signed)
Case Management Note Marvetta Gibbons RN,BSN Unit [facility]22 Case Manager  [phone]  Patient Details  Name: Paul Kramer MRN: [id number] Date of Birth: 04-07-1930  Subjective/Objective:   Pt admitted with STEMI, MVD, per surgery not a good candidate for open heart surgery plan for PCI after renal functions stabilized.                Action/Plan: PTA pt lived at home, CM to follow for transition of care needs  Expected Discharge Date:                  Expected Discharge Plan:     In-House Referral:     Discharge planning Services  CM Consult  Post Acute Care Choice:    Choice offered to:     DME Arranged:    DME Agency:     HH Arranged:    HH Agency:     Status of Service:  In process, will continue to follow  If discussed at Long Length of Stay Meetings, dates discussed:    Additional Comments:  Dawayne Patricia, RN 12/03/2017, 11:29 AM

## 2017-12-03 NOTE — Consult Note (Signed)
Referring Provider: No ref. 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 He has chronic renal insufficiency and has been followed by Dr Felipa Eth. His baseline creatinine has been running in the range of 1.6 - 1.8 and he has been taking an ace inhibitor lisinopril for additional renal protection. He was admitted with an acute MI and atrial fibrillation and was taken to the cath lab 6/2  That demonstrated mildly decreased EF 45-50% and diffuse coronary disease. It appears that balloon angioplasty was attempted but unable to obtain satisfactory results. He was initially considered for a CABG but was not thought to be a candidate and then considered for further intervention with stent placement. His serum creatinine appears to have increased above 2.0 although he has good urine output and no shortness of breath and only 1 + pitting edema in the shins. He otherwise appears very comfortable and continues on anticoagulation.   Past Medical History:  Diagnosis Date  . AAA (abdominal aortic aneurysm) ([place])   . Hypercholesteremia   . Hypertension   . Myocardial infarction [facility])    Per OLD EKG per son  . Narcolepsy    pt may pass out when laughing  . Spinal stenosis   . TIA (transient ischemic attack)     Past Surgical History:  Procedure Laterality Date  . CORONARY BALLOON ANGIOPLASTY N/A 11/29/2017   Procedure: CORONARY BALLOON ANGIOPLASTY;  Surgeon: Leonie Man, MD;  Location: [place] [facility];  Service: Cardiovascular;  Laterality: N/A;  . LEFT HEART CATH AND CORONARY ANGIOGRAPHY N/A 11/29/2017   Procedure: LEFT HEART CATH AND CORONARY ANGIOGRAPHY;  Surgeon: Leonie Man, MD;  Location: [place] [facility];  Service: Cardiovascular;   Laterality: N/A;  . LIPOMA EXCISION    . SPINAL FIXATION SURGERY      Prior to Admission medications   Medication Sig Start Date End Date Taking? Authorizing Provider  allopurinol (ZYLOPRIM) 100 MG tablet Take 100 mg by mouth every morning. 10/08/17  Yes [provider]  amoxicillin (AMOXIL) 500 MG capsule Take 500 mg by mouth 3 (three) times daily. For 7 days 11/26/17  Yes [provider]  aspirin EC 81 MG tablet Take 81 mg by mouth daily.   Yes [provider]  dexamethasone (DECADRON) 4 MG tablet Take 4 mg by mouth 3 (three) times daily. 11/26/17  Yes [provider]  lisinopril (PRINIVIL,ZESTRIL) 10 MG tablet Take 10 mg by mouth every morning. 10/08/17  Yes [provider]  Multiple Vitamins-Minerals (CENTRUM SILVER PO) Take 1 tablet by mouth daily.   Yes [provider]  simvastatin (ZOCOR) 10 MG tablet Take 10 mg by mouth every evening. 10/08/17  Yes [provider]  terazosin (HYTRIN) 2 MG capsule Take 4 mg by mouth every evening. 10/08/17  Yes [provider]    Current Facility-Administered Medications  Medication Dose Route Frequency Provider Last Rate Last Dose  . acetaminophen (TYLENOL) tablet 650 mg  650 mg Oral Q4H PRN Bryna Colander, MD   650 mg at 12/02/17 1456  . allopurinol (ZYLOPRIM) tablet 100 mg  100 mg Oral Lyn Hollingshead, MD   100 mg at 12/03/17 5809  . [START ON 12/04/2017] amiodarone (PACERONE) tablet 100 mg  100 mg Oral  Daily [name], MD      . aspirin chewable tablet 81 mg  81 mg Oral Daily Leonie Man, MD   81 mg at 12/03/17 4098  . atorvastatin (LIPITOR) tablet 80 mg  80 mg Oral q1800 Bryna Colander, MD   80 mg at 12/02/17 1811  . heparin ADULT infusion 100 units/mL (25000 units/236mL sodium chloride 0.45%)  850 Units/hr Intravenous Continuous Erenest Blank, RPH 8.5 mL/hr at 12/03/17 0926 850 Units/hr at 12/03/17 0926  . magnesium hydroxide (MILK OF MAGNESIA)  suspension 30 mL  30 mL Oral Daily PRN Leonie Man, MD      . metoprolol tartrate (LOPRESSOR) tablet 12.5 mg  12.5 mg Oral BID Bryna Colander, MD   12.5 mg at 12/03/17 0925  . morphine 2 MG/ML injection 2 mg  2 mg Intravenous Once Leonie Man, MD   Stopped at 11/30/17 [phone]  . morphine 2 MG/ML injection 2 mg  2 mg Intravenous Once Bryna Colander, MD   Stopped at 11/30/17 [phone]  . nitroGLYCERIN (NITROSTAT) SL tablet 0.4 mg  0.4 mg Sublingual Q5 Min x 3 PRN Bryna Colander, MD      . ondansetron [facility]) injection 4 mg  4 mg Intravenous Q6H PRN Bryna Colander, MD      . ondansetron [facility]) injection 4 mg  4 mg Intravenous Q6H PRN Leonie Man, MD      . simethicone [facility]) chewable tablet 80 mg  80 mg Oral Q6H PRN Barrett, Evelene Croon, PA-C   80 mg at 11/30/17 1758  . sodium chloride flush (NS) 0.9 % injection 3 mL  3 mL Intravenous Q12H Leonie Man, MD   3 mL at 12/01/17 2123  . sodium chloride flush (NS) 0.9 % injection 3 mL  3 mL Intravenous PRN Leonie Man, MD      . terazosin (HYTRIN) capsule 4 mg  4 mg Oral QPM Leonie Man, MD   4 mg at 12/02/17 1812  . vitamin B-12 (CYANOCOBALAMIN) tablet 1,000 mcg  1,000 mcg Oral Daily [name], MD   1,000 mcg at 12/03/17 0925    Allergies as of 11/29/2017 - Review Complete 11/29/2017  Allergen Reaction Noted  . Other Shortness Of Breath 11/29/2017  . Contrast media [iodinated diagnostic agents] Other (See Comments) 11/29/2017    No family history on file.  Social History   Socioeconomic History  . Marital status: Unknown    Spouse name: Not on file  . Number of children: Not on file  . Years of education: Not on file  . Highest education level: Not on file  Occupational History  . Not on file  Social Needs  . Financial resource strain: Not on file  . Food insecurity:    Worry: Not on file    Inability: Not on file  . Transportation needs:    Medical: Not on file    Non-medical: Not on file   Tobacco Use  . Smoking status: Former Research scientist (life sciences)  . Smokeless tobacco: Never Used  Substance and Sexual Activity  . Alcohol use: Yes    Comment: rare  . Drug use: Never  . Sexual activity: Not on file  Lifestyle  . Physical activity:    Days per week: Not on file    Minutes per session: Not on file  . Stress: Not on file  Relationships  . Social connections:    Talks on phone: Not on file    Gets together: Not on file  Attends religious service: Not on file    Active member of club or organization: Not on file    Attends meetings of clubs or organizations: Not on file    Relationship status: Not on file  . Intimate partner violence:    Fear of current or ex partner: Not on file    Emotionally abused: Not on file    Physically abused: Not on file    Forced sexual activity: Not on file  Other Topics Concern  . Not on file  Social History Narrative  . Not on file    Review of Systems: Gen: Denies any fever, chills, sweats, anorexia, fatigue, weakness, malaise, weight loss, and sleep disorder HEENT: No visual complaints, No history of Retinopathy. Normal external appearance No Epistaxis or Sore throat. No sinusitis.   CV: mild edema lower extremity no dyspnea and appears rate controlled atrial fibrillation Resp: Denies dyspnea at rest, dyspnea with exercise, cough, sputum, wheezing, coughing up blood, and pleurisy. GI: Denies vomiting blood, jaundice, and fecal incontinence.   Denies dysphagia or odynophagia. GU : Denies urinary burning, blood in urine, urinary frequency, urinary hesitancy, nocturnal urination, and urinary incontinence.  No renal calculi. MS: Denies joint pain, limitation of movement, and swelling, stiffness, low back pain, extremity pain. Denies muscle weakness, cramps, atrophy.  No use of non steroidal antiinflammatory drugs. Derm: Denies rash, itching, dry skin, hives, moles, warts, or unhealing ulcers.  Psych: Denies depression, anxiety, memory loss, suicidal  ideation, hallucinations, paranoia, and confusion. Heme: Denies bruising, bleeding, and enlarged lymph nodes. Neuro: No headache.  No diplopia. No dysarthria.  No dysphasia.  No history of CVA.  No Seizures. No paresthesias.  No weakness. Endocrine No DM.  No Thyroid disease.  No Adrenal disease.  Physical Exam: Vital signs in last 24 hours: Temp:  [97.2 F (36.2 C)-98.6 F (37 C)] 97.7 F (36.5 C) (06/06 1143) Pulse Rate:  [46-56] 52 (06/06 1500) Resp:  [13-24] 15 (06/06 1500) BP: (88-134)/(43-97) 113/54 (06/06 1500) SpO2:  [92 %-99 %] 98 % (06/06 1500) Weight:  [198 lb 3.1 oz (89.9 kg)] 198 lb 3.1 oz (89.9 kg) (06/06 0900) Last BM Date: 11/30/17 General:   Alert,  Pleasant non distressed Head:  Normocephalic and atraumatic. Eyes:  Sclera clear, no icterus.   Conjunctiva pink. Ears:  Normal auditory acuity. Nose:  No deformity, discharge,  or lesions. Mouth:  No deformity or lesions, dentition normal. Neck:  Supple; no masses or thyromegaly. JVP sllight elevation Lungs: slightly diminished at the bases  Heart:  Regular rate and rhythm; no murmurs, clicks, rubs,  or gallops. Abdomen:  Soft, nontender and nondistended. No masses, hepatosplenomegaly or hernias noted. Normal bowel sounds, without guarding, and without rebound.   Msk:  Symmetrical without gross deformities. Normal posture. Pulses:  No carotid, renal, femoral bruits. DP and PT symmetrical and equal Extremities:  Without clubbing or edema. Neurologic:  Alert and  oriented x4;  grossly normal neurologically. Skin:  Intact without significant lesions or rashes.   Intake/Output from previous day: 06/05 0701 - 06/06 0700 In: 504 [P.O.:200; I.V.:304] Out: 2350 [Urine:2350] Intake/Output this shift: Total I/O In: 68 [I.V.:68] Out: 1125 [Urine:1125]  Lab Results: Recent Labs    12/01/17 0513 12/02/17 0109 12/03/17 0302  WBC 10.8* 8.6 6.6  HGB 8.7* 9.5* 8.8*  HCT 26.3* 28.4*  28.8* 26.3*  PLT 169 169 155    BMET Recent Labs    12/01/17 0513 12/02/17 0109 12/03/17 0302  NA 137 138 138  K  5.4* 4.9 5.1  CL 113* 115* 113*  CO2 14* 17* 20*  GLUCOSE 147* 132* 100*  BUN 60* 57* 49*  CREATININE 1.96* 2.00* 2.08*  CALCIUM 7.7* 7.7* 7.7*   LFT No results for input(s): PROT, ALBUMIN, AST, ALT, ALKPHOS, BILITOT, BILIDIR, IBILI in the last 72 hours. PT/INR No results for input(s): LABPROT, INR in the last 72 hours. Hepatitis Panel No results for input(s): HEPBSAG, HCVAB, HEPAIGM, HEPBIGM in the last 72 hours.  Studies/Results: US Renal  Result Date: 12/03/2017 CLINICAL DATA:  Acute kidney injury. EXAM: RENAL / URINARY TRACT ULTRASOUND COMPLETE COMPARISON:  Ultrasound of March 30, 2016. FINDINGS: Right Kidney: Length: 10.8 cm. Echogenicity within normal limits. No mass or hydronephrosis visualized. Left Kidney: Length: 11.1 cm. 3.8 cm cyst is noted in the renal pelvis. Echogenicity within normal limits. No mass or hydronephrosis visualized. Bladder: Decompressed secondary to Foley catheter. IMPRESSION: 3.8 cm left renal cyst. No other significant renal abnormality seen. Electronically Signed   By: Marijo Conception, M.D.   On: 12/03/2017 16:17   Dg Chest Port 1 View  Result Date: 12/03/2017 CLINICAL DATA:  Productive cough, CHF EXAM: PORTABLE CHEST 1 VIEW COMPARISON:  11/29/2017 FINDINGS: Calcified lymph nodes in the left hilum and mediastinum. Heart is normal size. Increasing linear densities in the lung bases, likely atelectasis. No effusions or acute bony abnormality. IMPRESSION: Bibasilar atelectasis. Electronically Signed   By: Rolm Baptise M.D.   On: 12/03/2017 11:34    Assessment/Plan:  Acute on chronic renal insufficiency following MI and cardiac catheterization with the administration of IV contrast 6/2  There has not been a drastic rise in the creatinine and is close to the creatinine on admission. He has a baseline of about 1.6 - 1.8 over the last year and was taking an ace inhibitor as  an outpatient. I have personally verified the previous creatinines with Dr Felipa Eth. I believe he has stage 3 - stage 4 chronic renal disease from hypertension and nephrosclerosis and is at high risk of contrast nephropathy. It may be wise to take a more conservative approach. I will check a renal ultrasound and urinalysis. I am fine with not using an ace at this time too. It may be ok to start it back once he has recovered renal function and returned to baseline.I would avoid nephrotoxins and nsaids as well.  Anemia with Hb 8.8 and normal iron studies we could start darbepoietin weekly and schedule first dose subcutaneous today  Bones check PTH, calcium a little low will need albumin and phosphorus  Hypertension/Volume appears slightly volume overloaded with lower extremity edema   We could try gently diuresis with some oral lasix 40 mg daily and cardiac low sodium diet.  Atrial fibrillation - amiodarone and heparin for now  Acute Coronary syndrome continues on atorvastatin 80mg  and heparin and aspirin   Will check CPK  Abnormal TSH  Looks fairly suppressed will recheck   Gout  Allopurinol  100mg  daily  I am good with this dose avoid nsaids   Elevated PSA  Follow up urology  History of BPH   LOS: 4 Lititia Sen W @TODAY @4 :45 PM

## 2017-12-04 DIAGNOSIS — R972 Elevated prostate specific antigen [PSA]: Secondary | ICD-10-CM

## 2017-12-04 DIAGNOSIS — N179 Acute kidney failure, unspecified: Secondary | ICD-10-CM

## 2017-12-04 LAB — TSH: TSH: 5.308 u[IU]/mL — ABNORMAL HIGH (ref 0.350–4.500)

## 2017-12-04 LAB — T4, FREE: Free T4: 1.12 ng/dL (ref 0.82–1.77)

## 2017-12-04 LAB — CBC
HEMATOCRIT: 26.7 % — AB (ref 39.0–52.0)
Hemoglobin: 9.1 g/dL — ABNORMAL LOW (ref 13.0–17.0)
MCH: 34.6 pg — AB (ref 26.0–34.0)
MCHC: 34.1 g/dL (ref 30.0–36.0)
MCV: 101.5 fL — AB (ref 78.0–100.0)
Platelets: 148 10*3/uL — ABNORMAL LOW (ref 150–400)
RBC: 2.63 MIL/uL — ABNORMAL LOW (ref 4.22–5.81)
RDW: 12.4 % (ref 11.5–15.5)
WBC: 8.1 10*3/uL (ref 4.0–10.5)

## 2017-12-04 LAB — RENAL FUNCTION PANEL
ALBUMIN: 2.8 g/dL — AB (ref 3.5–5.0)
Anion gap: 7 (ref 5–15)
BUN: 33 mg/dL — AB (ref 6–20)
CHLORIDE: 112 mmol/L — AB (ref 101–111)
CO2: 18 mmol/L — AB (ref 22–32)
Calcium: 7.9 mg/dL — ABNORMAL LOW (ref 8.9–10.3)
Creatinine, Ser: 1.82 mg/dL — ABNORMAL HIGH (ref 0.61–1.24)
GFR calc Af Amer: 37 mL/min — ABNORMAL LOW (ref >60)
GFR calc non Af Amer: 32 mL/min — ABNORMAL LOW (ref >60)
GLUCOSE: 110 mg/dL — AB (ref 65–99)
PHOSPHORUS: 3.7 mg/dL (ref 2.5–4.6)
Potassium: 5.2 mmol/L — ABNORMAL HIGH (ref 3.5–5.1)
Sodium: 137 mmol/L (ref 135–145)

## 2017-12-04 LAB — CK: Total CK: 41 U/L — ABNORMAL LOW (ref 49–397)

## 2017-12-04 LAB — HEPARIN LEVEL (UNFRACTIONATED)
Heparin Unfractionated: 0.15 IU/mL — ABNORMAL LOW (ref 0.30–0.70)
Heparin Unfractionated: 0.36 IU/mL (ref 0.30–0.70)

## 2017-12-04 MED ORDER — PANTOPRAZOLE SODIUM 40 MG PO TBEC
40.0000 mg | DELAYED_RELEASE_TABLET | Freq: Every day | ORAL | Status: DC
  Administered 2017-12-04 – 2017-12-11 (×8): 40 mg via ORAL
  Filled 2017-12-04 (×8): qty 1

## 2017-12-04 NOTE — Progress Notes (Signed)
Paul Kramer for Heparin Indication: atrial fibrillation and ACS  Allergies  Allergen Reactions  . Other Shortness Of Breath    Esters  . Contrast Media [Iodinated Diagnostic Agents] Other (See Comments)    Just feel bad all over  . Morphine And Related Other (See Comments)    Patient states he does not like to take Morphine as it gives him nightmares and hallucinations.     Patient Measurements: Height: 5\' 5"  (165.1 cm) Weight: 198 lb 3.1 oz (89.9 kg) IBW/kg (Calculated) : 61.5 Heparin Dosing Weight: 78kg  Vital Signs: Temp: 98.2 F (36.8 C) (06/07 0700) Temp Source: Oral (06/07 0700) BP: 123/49 (06/07 0900) Pulse Rate: 57 (06/07 0800)  Labs: Recent Labs    12/02/17 0109 12/02/17 0948 12/02/17 1922 12/03/17 0302 12/04/17 0807 12/04/17 0817  HGB 9.5*  --   --  8.8* 9.1*  --   HCT 28.4*  28.8*  --   --  26.3* 26.7*  --   PLT 169  --   --  155 148*  --   HEPARINUNFRC 0.73* 0.32 0.33  --   --  0.15*  CREATININE 2.00*  --   --  2.08* 1.82*  --   CKTOTAL  --   --   --   --  41*  --     Estimated Creatinine Clearance: 30.6 mL/min (A) (by C-G formula based on SCr of 1.82 mg/dL (H)).   Assessment: 67 YOM who presents with SOB and some chest tightness. New AFib on EKG, troponins are also elevated. Patient is not on anticoagulation PTA. He has large hematoma on right arm post cath this is stable and improved from post-cath.   Heparin level missed 6/6, level low this am at 0.15. CBC stable, will adjust rate. No immediate plans for cath.   Goal of Therapy:  Heparin level 0.3-0.7 units/ml Monitor platelets by anticoagulation protocol: Yes   Plan:   Increase heparin to 1050 units/hr Recheck heparin level tonight  Erin Hearing PharmD., BCPS Clinical Pharmacist 12/04/2017 10:29 AM

## 2017-12-04 NOTE — Consult Note (Addendum)
[facility] Nurse wound consult note Reason for Consult: PI to left elbow Wound type: Unstageable pressure injury. Exam completed on 2H08. According to primary RN, patient has been in this unit throughout his stay. Pressure Injury POA: No Measurement: 1.6 cm x 1.6 cm x unknown depth Wound bed: 100% yellow slough Drainage (amount, consistency, odor) No odor, no drainage on foam dressing, no erythema, no induration Periwound: Intact skin normal color and texture Dressing procedure/placement/frequency: Cleanse area with soap and water. Pat dry. Apply Replicare hydrocolloid. Change every 5 days and prn. Monitor the wound area(s) for worsening of condition such as: Signs/symptoms of infection,  Increase in size,  Development of or worsening of odor, Development of pain, or increased pain at the affected locations.  Notify the medical team if any of these develop.  Thank you for the consult.  Discussed plan of care with the patient and bedside nurse.   Val Riles, RN, MSN, CWOCN, CNS-BC, pager [phone]

## 2017-12-04 NOTE — Progress Notes (Signed)
CARDIAC REHAB PHASE I   PRE:  Rate/Rhythm: 86 SB  BP:  Supine:   Sitting: 11/50  Standing:    SaO2: 98%RA  MODE:  Ambulation: 160 ft   POST:  Rate/Rhythm: 64 SR  BP:  Supine:   Sitting: 119/97  Standing:    SaO2: 98%RA 1100-1140 Pt walked 160 ft on RA with gait belt use, rolling walker and asst x 1 with steady gait. No CP. Feeling a little stronger today. To recliner with call bell. Daughter in room. Reviewed NTG use.   Graylon Good, RN BSN  12/04/2017 11:35 AM

## 2017-12-04 NOTE — Plan of Care (Signed)
Heart healthy and renal diet education provided.   Provided pt with the "Heart Healthy Nutrition Therapy" and "Acute Kidney Injury Nutrition Therapy" from the Academy of Nutrition and Dietetics.   Pt reports eating 2 meals/day typically; he will eat eggs for breakfast and a meat/starch/vegetable for dinner. Pt reports baking cakes and eating things like chili with beef. Discussed MyPlate with pt as a guide for general healthy eating. Discussed with pt that protein doesn't necessarily need to be limited so long as he is eating one serving at each meal. Discussed with pt choosing leaner meats, fish, and beans as protein choices keeping in mind the phosphorus content of those foods.   Told pt that while potassium and phosphorus are good for our body, renal patients have a difficult time processing it and should be mindful about limiting high potassium and phosphorus foods. Discussed which foods are higher in potassium and phosphorus; discussing dairy, meat, beans, and grains with pt and family. Answered pt questions about specific foods.   Discussed pt choosing healthier fats and talked through specific foods he could modify to be heart healthy. Reviewed MyPlate with pt and family.   Used teach back method.   Talked with pt about modifying what he was already doing and to limit the foods that can put stress on his heart and renal function.   Pt does not report any recent weight loss. Pt BMI is 32.98 - obese. Pt NFPE WNL for age.  Pt on Heart Healthy diet eating 100% of meals at this time.   No further nutrition interventions warranted at this time. If nutrition issues arise please re-consult Rd.  Hope Budds, Dietetic Intern

## 2017-12-04 NOTE — Progress Notes (Addendum)
Progress Note  Patient Name: Paul Kramer Date of Encounter: 12/04/2017  Primary Cardiologist: Ellyn Hack  Subjective   Patient had a rough night last night, unable to sleep well.  Denies any chest pain or shortness of breath. Developing a pressure sore at left elbow. Daughter was wondering that if patient can be discharged today and then follow-up later on for possible PCI.  Inpatient Medications    Scheduled Meds: . allopurinol  100 mg Oral BH-q7a  . amiodarone  100 mg Oral Daily  . aspirin  81 mg Oral Daily  . atorvastatin  80 mg Oral q1800  . darbepoetin (ARANESP) injection - NON-DIALYSIS  100 mcg Subcutaneous Q Fri-1800  . metoprolol tartrate  12.5 mg Oral BID  .  morphine injection  2 mg Intravenous Once  .  morphine injection  2 mg Intravenous Once  . sodium chloride flush  3 mL Intravenous Q12H  . terazosin  4 mg Oral QPM  . vitamin B-12  1,000 mcg Oral Daily   Continuous Infusions: . heparin 850 Units/hr (12/04/17 0700)   PRN Meds: acetaminophen, magnesium hydroxide, nitroGLYCERIN, ondansetron (ZOFRAN) IV, ondansetron (ZOFRAN) IV, simethicone, sodium chloride flush   Vital Signs    Vitals:   12/04/17 0600 12/04/17 0700 12/04/17 0800 12/04/17 0900  BP: (!) 105/50 (!) 123/59 133/65 (!) 123/49  Pulse: (!) 49 (!) 57 (!) 57   Resp: 16 18 (!) 21 16  Temp:  98.2 F (36.8 C)    TempSrc:  Oral    SpO2: 94% 93% 94%   Weight:      Height:        Intake/Output Summary (Last 24 hours) at 12/04/2017 1020 Last data filed at 12/04/2017 0900 Gross per 24 hour  Intake 2415.5 ml  Output 5300 ml  Net -2884.5 ml    I/O since admission:   Filed Weights   11/29/17 1935 12/02/17 0630 12/03/17 0900  Weight: 183 lb (83 kg) 199 lb 8.3 oz (90.5 kg) 198 lb 3.1 oz (89.9 kg)    Telemetry     - Personally Reviewed.  Sinus rhythm with borderline bradycardia.  ECG    ECG (independently read by me): Not done today.  Physical Exam    BP (!) 123/49   Pulse (!) 57   Temp  98.2 F (36.8 C) (Oral)   Resp 16   Ht 5\' 5"  (1.651 m)   Wt 198 lb 3.1 oz (89.9 kg)   SpO2 94%   BMI 32.98 kg/m  General: Alert, oriented, no distress.  Skin: normal turgor, multiple bilateral ecchymosis, pressure injury and left elbow with bandage on and at sacrum. HEENT: Normocephalic, atraumatic.  Neck: No JVD, no carotid bruits; normal carotid upstroke Lungs: clear to ausculatation and percussion; no wheezing or rales Chest wall: without tenderness to palpitation Heart: PMI not displaced, RRR, s1 s2 normal, 1/6 systolic murmur, no diastolic murmur, no rubs, gallops, thrills, or heaves Abdomen: soft, nontender; no hepatosplenomehaly, BS+; abdominal aorta nontender and not dilated by palpation. Pulses 2+ Extremities: Trace pedal edema, Homan's sign negative  Neurologic: grossly nonfocal; Cranial nerves grossly wnl Psychologic: Normal mood and affect  Labs    Chemistry Recent Labs  Lab 12/02/17 0109 12/03/17 0302 12/04/17 0807  NA 138 138 137  K 4.9 5.1 5.2*  CL 115* 113* 112*  CO2 17* 20* 18*  GLUCOSE 132* 100* 110*  BUN 57* 49* 33*  CREATININE 2.00* 2.08* 1.82*  CALCIUM 7.7* 7.7* 7.9*  ALBUMIN  --   --  2.8*  GFRNONAA 29* 27* 32*  GFRAA 33* 32* 37*  ANIONGAP 6 5 7      Hematology Recent Labs  Lab 12/02/17 0109 12/03/17 0302 12/04/17 0807  WBC 8.6 6.6 8.1  RBC 2.75* 2.61* 2.63*  HGB 9.5* 8.8* 9.1*  HCT 28.4*  28.8* 26.3* 26.7*  MCV 103.3* 100.8* 101.5*  MCH 34.5* 33.7 34.6*  MCHC 33.5 33.5 34.1  RDW 12.8 12.2 12.4  PLT 169 155 148*    Cardiac Enzymes Recent Labs  Lab 11/30/17 0017 11/30/17 0510 11/30/17 1126  TROPONINI 2.69* 7.33* 9.30*    Recent Labs  Lab 11/29/17 1940  TROPIPOC 1.84*     BNP Recent Labs  Lab 12/03/17 1141  BNP 201.8*     DDimer No results for input(s): DDIMER in the last 168 hours.   Lipid Panel     Component Value Date/Time   CHOL 137 11/30/2017 0017   TRIG 93 11/30/2017 0017   HDL 38 (L) 11/30/2017 0017    CHOLHDL 3.6 11/30/2017 0017   VLDL 19 11/30/2017 0017   LDLCALC 80 11/30/2017 0017    Radiology    US Renal  Result Date: 12/03/2017 CLINICAL DATA:  Acute kidney injury. EXAM: RENAL / URINARY TRACT ULTRASOUND COMPLETE COMPARISON:  Ultrasound of March 30, 2016. FINDINGS: Right Kidney: Length: 10.8 cm. Echogenicity within normal limits. No mass or hydronephrosis visualized. Left Kidney: Length: 11.1 cm. 3.8 cm cyst is noted in the renal pelvis. Echogenicity within normal limits. No mass or hydronephrosis visualized. Bladder: Decompressed secondary to Foley catheter. IMPRESSION: 3.8 cm left renal cyst. No other significant renal abnormality seen. Electronically Signed   By: Marijo Conception, M.D.   On: 12/03/2017 16:17   Dg Chest Port 1 View  Result Date: 12/03/2017 CLINICAL DATA:  Productive cough, CHF EXAM: PORTABLE CHEST 1 VIEW COMPARISON:  11/29/2017 FINDINGS: Calcified lymph nodes in the left hilum and mediastinum. Heart is normal size. Increasing linear densities in the lung bases, likely atelectasis. No effusions or acute bony abnormality. IMPRESSION: Bibasilar atelectasis. Electronically Signed   By: Rolm Baptise M.D.   On: 12/03/2017 11:34    Cardiac Studies   11/29/2017 Emergent Cardiac CathConclusion     There is mild left ventricular systolic dysfunction.  The left ventricular ejection fraction is 45-50% by visual estimate.  Prox RCA to Mid RCA lesion is 80% stenosed. Mid RCA to Dist RCA lesion is 100% stenosed with 95% stenosed side branch in Post Atrio.  Ost LM to Mid LM lesion is 40% stenosed.  Balloon angioplasty was attempted using a BALLOON SAPPHIRE 2.0X12. -- was unable to cross the 100% stenosis & no reduction in 80% lesion.  Mid Cx to Dist Cx lesion is 95% stenosed (thrombotic) with 55% stenosed side branch in Ost 2nd Mrg. -- LIKELY THE CULPRIT LESION  Ost LAD to Prox LAD lesion is 85% stenosed.   Severe three-vessel disease with 100% CTO mid RCA, severe 95%  thrombotic lesion trifurcation lesion of the distal Cx-OM 2-LPL 1 and proximal LAD 85% eccentric lesion.  Unable to cross RCA lesion confirming CTO (PDA fills via collaterals from septal perforators)  Mildly reduced LVEF of roughly 45 to 50% with inferoapical hypokinesis  Since the patient was chest pain-free on medications, and the RCA appears to be chronically occluded with complex trifurcation circumflex lesion as a likely culprit, I felt it more prudent to stop at this point to allow time for further clarification of the best course of action going forward. His CKD  would also warrant staged PCI if indicated as opposed to continued contrast use.  --Classically this would be surgical disease, however could also be done with two-vessel PCI and medical management of the RCA which appears to be chronic. My impression is that his best option is probably two-vessel PCI, however we have consulted CT surgery.  Plan: Admit to ICU, restart IV heparin TR band removal per protocol;   IV amiodarone for atrial fib .Marland Kitchen  Continue Cangrelor infusion x 2 hr  Gentle hydration  CT surgery has been consulted, however after discussing with the patient's daughter and son-in-law, they do not feel that he would be amenable to CABG. If this is clearly decision, we will go ahead and load with Plavix 300 mg and go ahead and schedule for two-vessel PCI on Tuesday. Otherwise will await CT surgical consult on Monday for final decision. ? The Cx-LPL lesion is a complicated trifurcation lesion, but would attempt at main vessel stenting into the distal Cx-LPL across the OM branch with provisional PTCA/PCI of the OM.  Statin and beta-blocker added  2D echo ordered     ------------------------------------------------------------------- 11/30/2017 ECHOStudy Conclusions  - Left ventricle: The cavity size was normal. There was mild focal basal hypertrophy of the septum. Systolic function was mildly reduced.  The estimated ejection fraction was in the range of 45% to 50%. There is hypokinesis of the inferior myocardium. There was no evidence of elevated ventricular filling pressure by Doppler parameters. - Aortic valve: Valve mobility was restricted. There was moderate regurgitation. - Mitral valve: There was mild to moderate regurgitation. - Right ventricle: The cavity size was mildly dilated. Wall thickness was normal.  Patient Profile     84 y.o. male whopresented with acute onset exertional dyspnea and diaphoresis and chest discomfort. He was initially noted to be in A. fib with subtle EKG changes, however with recurrence of active chest pain he developed1 to 2 mm inferior elevations as well as some lateral ST elevation.He was taken to Cath Lab because of these elevations and positive troponin.  Assessment & Plan    STEMI.LV gram showed inferior/apical hypokinesis. Coronary angiogram with 100% chronically occluded mid RCA. LCx with 95-99% trifurcation lesion at distal vessel, thought to be likely culprit given chronicity of RCA lesion. LAD with 80% proximal lesion and LM 40%. Cardiothoracic surgery does not think that he is a good candidate for open heart surgery and advised a PCIto left circumflex and proximal LAD,which will be done in couple of days after renal functions stabilized. His postprocedural right wrist hematoma is improving.No sign of active bleeding. BNP at 200.  Does not show any sign of volume overload with very good urinary output over last 24-hour without any Lasix. -Continue elevating right arm with Ace wrap. -Continue aspirin and Lipitor. -Continue metoprolol 12.5 mg twice daily. -Will wait for few more days to see any continuation of improvement in his renal function before taking him to Cath Lab again. -Patient and family is interested in discharging and following up as an outpatient.  AFib.Remained in sinus rhythm with rate in 50s to  60. -Decreased amiodarone to 100 mg daily. -Continue metoprolol 12.5 mg twice daily.  CKD.Cr.at 1.82today , improved from yesterday with urinary output above 4 L with a net of -2.5 L over the last 24 hours without any Lasix.  Renal ultrasound without any hydronephrosis and a 3 cm left renal cyst. Nephrology is following-appreciate their recommendations. -Daily renal function.   Hyperkalemia.Again developing mild hyperkalemia with potassium of  5.2 today.  Urinary retention.Patient with a history of BPH,Foley was placed because of his inability to void. PSA was checked yesterday which was above 8-will need urology follow-up as an outpatient. -Continue home dose of terazosin.  Anemia.Patient hemoglobin stable around 9.1. Found to have FOBT positive.No sign of any active bleeding.He developed a large hematoma after cath.Denies any hematuria or hematochezia.Baseline hemoglobin around 10.5. Iron studies consistent with anemia of chronic illness, vitamin B12 borderline low. Slowly developing thrombocytopenia.  Patient is on heparin. -Renal starting Aranesp today. -Monitor CBC. -Continue B12 supplement. -Continue Protonix 40 mg  daily.  Signed, [name], MD PGY2 12/04/2017, 10:20 AM     Patient seen and examined. Agree with assessment and plan. No recurrent chest pain. Appreciate Dr. Jason Nest note and discussed this am with Dr. Posey Pronto.  Good U/O last night. BNP 201.  Cr improved to 1.82 today.  No obstructive nephropathy on [place].. PSA increased at 8.4; will need outpatient urology f/u.  Will transfer out of 2H today;  Keep foley in ? Dc foley tomorrow. No recurrent AF.  Will allow the weekend for additional renal improvement and plan for PCI on Monday ( groin approach).   Troy Sine, MD, [facility] 12/04/2017 11:17 AM

## 2017-12-04 NOTE — Progress Notes (Signed)
Lab unable to obtain needed samples despite several attempts. Writer spoke with on-call fellow Aundra Dubin who allowed for lab to attempt a foot stick. Lab notified and will attempt.

## 2017-12-04 NOTE — Progress Notes (Signed)
Patient ID: Paul Kramer, male   DOB: 1929-10-02, 84 y.o.   MRN: [id number]  [facility] Progress Note   Assessment/ Plan:   1.  Acute kidney injury on chronic kidney disease stage IV: Likely hemodynamically mediated versus from contrast nephropathy-excellent urine output overnight without need for furosemide.  Awaiting labs from this morning to decide on electrolyte/renal function.  Agree with delaying re-exposure to contrast/attempted PCI of distal circumflex. 2.  ST elevation MI: Coronary angiogram reports reviewed-chronically occluded mid RCA with significant distal circumflex stenosis as well as LAD lesion.  Not a candidate for CABG and plans noted for delayed PCI after hemodynamic/renal stabilization. 3.  Hyperkalemia: Mild-monitor with brisk urine output/renal recovery. 4.  Anemia: Subcutaneous ecchymosis/hematoma noted right arm status post coronary angiography without any associated compartment syndrome.  Continue to monitor for PRBC indications.  Subjective:   Reports to be feeling fair-did not get comfortable sleep overnight.  Some difficulty with lab draw this morning.   Objective:   BP (!) 123/59   Pulse (!) 57   Temp 98.5 F (36.9 C) (Oral)   Resp 18   Ht 5\' 5"  (1.651 m)   Wt 89.9 kg (198 lb 3.1 oz)   SpO2 93%   BMI 32.98 kg/m   Intake/Output Summary (Last 24 hours) at 12/04/2017 0814 Last data filed at 12/04/2017 0700 Gross per 24 hour  Intake 2184 ml  Output 4700 ml  Net -2516 ml   Weight change:   Physical Exam: Gen: Comfortably resting in bed, daughter at bedside CVS: Pulse regular bradycardia, S1 and S2 normal Resp: Diminished breath sounds over bases-no distinct rales or rhonchi Abd: Soft, obese, nontender Ext: 1+ upper extremity edema, trace ankle edema.  Imaging: US Renal  Result Date: 12/03/2017 CLINICAL DATA:  Acute kidney injury. EXAM: RENAL / URINARY TRACT ULTRASOUND COMPLETE COMPARISON:  Ultrasound of March 30, 2016. FINDINGS: Right  Kidney: Length: 10.8 cm. Echogenicity within normal limits. No mass or hydronephrosis visualized. Left Kidney: Length: 11.1 cm. 3.8 cm cyst is noted in the renal pelvis. Echogenicity within normal limits. No mass or hydronephrosis visualized. Bladder: Decompressed secondary to Foley catheter. IMPRESSION: 3.8 cm left renal cyst. No other significant renal abnormality seen. Electronically Signed   By: Marijo Conception, M.D.   On: 12/03/2017 16:17   Dg Chest Port 1 View  Result Date: 12/03/2017 CLINICAL DATA:  Productive cough, CHF EXAM: PORTABLE CHEST 1 VIEW COMPARISON:  11/29/2017 FINDINGS: Calcified lymph nodes in the left hilum and mediastinum. Heart is normal size. Increasing linear densities in the lung bases, likely atelectasis. No effusions or acute bony abnormality. IMPRESSION: Bibasilar atelectasis. Electronically Signed   By: Rolm Baptise M.D.   On: 12/03/2017 11:34    Labs: BMET Recent Labs  Lab 11/29/17 1934 11/30/17 0510 12/01/17 0513 12/02/17 0109 12/03/17 0302  NA 138 138 137 138 138  K 5.2* 5.2* 5.4* 4.9 5.1  CL 115* 119* 113* 115* 113*  CO2 13* 14* 14* 17* 20*  GLUCOSE 162* 178* 147* 132* 100*  BUN 52* 55* 60* 57* 49*  CREATININE 1.99* 1.84* 1.96* 2.00* 2.08*  CALCIUM 8.1* 7.7* 7.7* 7.7* 7.7*   CBC Recent Labs  Lab 11/30/17 0510 12/01/17 0513 12/02/17 0109 12/03/17 0302  WBC 12.0* 10.8* 8.6 6.6  HGB 9.8* 8.7* 9.5* 8.8*  HCT 29.4* 26.3* 28.4*  28.8* 26.3*  MCV 103.9* 102.7* 103.3* 100.8*  PLT 182 169 169 155   Medications:    . allopurinol  100 mg Oral BH-q7a  .  amiodarone  100 mg Oral Daily  . aspirin  81 mg Oral Daily  . atorvastatin  80 mg Oral q1800  . darbepoetin (ARANESP) injection - NON-DIALYSIS  100 mcg Subcutaneous Q Fri-1800  . metoprolol tartrate  12.5 mg Oral BID  .  morphine injection  2 mg Intravenous Once  .  morphine injection  2 mg Intravenous Once  . sodium chloride flush  3 mL Intravenous Q12H  . terazosin  4 mg Oral QPM  . vitamin  B-12  1,000 mcg Oral Daily   Elmarie Shiley, MD 12/04/2017, 8:14 AM

## 2017-12-05 LAB — BASIC METABOLIC PANEL
Anion gap: 5 (ref 5–15)
BUN: 32 mg/dL — AB (ref 6–20)
CHLORIDE: 113 mmol/L — AB (ref 101–111)
CO2: 20 mmol/L — AB (ref 22–32)
Calcium: 7.8 mg/dL — ABNORMAL LOW (ref 8.9–10.3)
Creatinine, Ser: 1.78 mg/dL — ABNORMAL HIGH (ref 0.61–1.24)
GFR calc Af Amer: 38 mL/min — ABNORMAL LOW (ref >60)
GFR calc non Af Amer: 33 mL/min — ABNORMAL LOW (ref >60)
GLUCOSE: 97 mg/dL (ref 65–99)
POTASSIUM: 5.6 mmol/L — AB (ref 3.5–5.1)
Sodium: 138 mmol/L (ref 135–145)

## 2017-12-05 LAB — CBC WITH DIFFERENTIAL/PLATELET
ABS IMMATURE GRANULOCYTES: 0.1 10*3/uL (ref 0.0–0.1)
Basophils Absolute: 0 10*3/uL (ref 0.0–0.1)
Basophils Relative: 0 %
Eosinophils Absolute: 0.6 10*3/uL (ref 0.0–0.7)
Eosinophils Relative: 9 %
HCT: 26.1 % — ABNORMAL LOW (ref 39.0–52.0)
HEMOGLOBIN: 8.7 g/dL — AB (ref 13.0–17.0)
Immature Granulocytes: 2 %
LYMPHS PCT: 15 %
Lymphs Abs: 1.1 10*3/uL (ref 0.7–4.0)
MCH: 34.1 pg — AB (ref 26.0–34.0)
MCHC: 33.3 g/dL (ref 30.0–36.0)
MCV: 102.4 fL — AB (ref 78.0–100.0)
MONO ABS: 0.6 10*3/uL (ref 0.1–1.0)
MONOS PCT: 8 %
NEUTROS ABS: 4.9 10*3/uL (ref 1.7–7.7)
Neutrophils Relative %: 66 %
Platelets: 171 10*3/uL (ref 150–400)
RBC: 2.55 MIL/uL — ABNORMAL LOW (ref 4.22–5.81)
RDW: 12.6 % (ref 11.5–15.5)
WBC: 7.4 10*3/uL (ref 4.0–10.5)

## 2017-12-05 LAB — CBC
HCT: 24.2 % — ABNORMAL LOW (ref 39.0–52.0)
HEMOGLOBIN: 8 g/dL — AB (ref 13.0–17.0)
MCH: 33.8 pg (ref 26.0–34.0)
MCHC: 33.1 g/dL (ref 30.0–36.0)
MCV: 102.1 fL — AB (ref 78.0–100.0)
Platelets: 157 10*3/uL (ref 150–400)
RBC: 2.37 MIL/uL — AB (ref 4.22–5.81)
RDW: 12.5 % (ref 11.5–15.5)
WBC: 8.1 10*3/uL (ref 4.0–10.5)

## 2017-12-05 LAB — HEPARIN LEVEL (UNFRACTIONATED): Heparin Unfractionated: 0.28 IU/mL — ABNORMAL LOW (ref 0.30–0.70)

## 2017-12-05 MED ORDER — PATIROMER SORBITEX CALCIUM 8.4 G PO PACK
8.4000 g | PACK | Freq: Two times a day (BID) | ORAL | Status: DC
  Administered 2017-12-05 (×2): 8.4 g via ORAL
  Filled 2017-12-05 (×3): qty 1

## 2017-12-05 NOTE — Progress Notes (Signed)
CARDIAC REHAB PHASE I   PRE:  Rate/Rhythm: 50 SB   BP:  Sitting: 109/54      SaO2: 96% RA  MODE:  Ambulation: 200 ft   POST:  Rate/Rhythm: 72 SR    Came to walk with pt earlier, but pt was asleep. Returned to pt's room and pt was ready to walk. RN and Nurse Tech were in room and assisted with getting pt ready to walk. Pt was 1 assist,  used walker and gait belt during walk. Gait was steady. Pt stated he felt good and denied any complaints of CP, SOB, or dizziness. Pt returned to room. Pt given bath upon returning to room, left with RN, no post vitals were taken. Talked with pt's daughter about Cardiac Rehab and referral process. Pt's daughter is very interested in having pt participate. CR will continue to follow pt.   [id number]  Carma Lair MS, ACSM CEP  11:34 AM 12/05/2017

## 2017-12-05 NOTE — Progress Notes (Signed)
Progress Note  Patient Name: Paul Kramer Date of Encounter: 12/05/2017  Primary Cardiologist: Glenetta Hew, MD   Subjective   Breathing is OK   No CP    Inpatient Medications    Scheduled Meds: . allopurinol  100 mg Oral BH-q7a  . amiodarone  100 mg Oral Daily  . aspirin  81 mg Oral Daily  . atorvastatin  80 mg Oral q1800  . darbepoetin (ARANESP) injection - NON-DIALYSIS  100 mcg Subcutaneous Q Fri-1800  . metoprolol tartrate  12.5 mg Oral BID  . pantoprazole  40 mg Oral Daily  . patiromer  8.4 g Oral BID  . sodium chloride flush  3 mL Intravenous Q12H  . terazosin  4 mg Oral QPM  . vitamin B-12  1,000 mcg Oral Daily   Continuous Infusions: . heparin 1,050 Units/hr (12/04/17 1053)   PRN Meds: acetaminophen, magnesium hydroxide, nitroGLYCERIN, ondansetron (ZOFRAN) IV, simethicone, sodium chloride flush   Vital Signs    Vitals:   12/04/17 2314 12/05/17 0416 12/05/17 0638 12/05/17 0801  BP: (!) 118/54 (!) 125/55  (!) 124/57  Pulse: (!) 56 (!) 56  (!) 58  Resp: 17 16  18   Temp: 98.6 F (37 C) 98.2 F (36.8 C)  97.9 F (36.6 C)  TempSrc: Oral Oral  Oral  SpO2: 100% 96%  97%  Weight:   87.5 kg (192 lb 14.4 oz)   Height:        Intake/Output Summary (Last 24 hours) at 12/05/2017 0910 Last data filed at 12/05/2017 0700 Gross per 24 hour  Intake 570.73 ml  Output 2350 ml  Net -1779.27 ml    Net I/O   -3.3 L   Filed Weights   12/02/17 0630 12/03/17 0900 12/05/17 0638  Weight: 90.5 kg (199 lb 8.3 oz) 89.9 kg (198 lb 3.1 oz) 87.5 kg (192 lb 14.4 oz)    Telemetry    Sinus bradycardia   - Personally Reviewed  ECG      Physical Exam   GEN: No acute distress.   Neck: No JVD Cardiac: RRR, no murmurs, rubs, or gallops.  Respiratory: Clear to auscultation bilaterally. GI: Soft, nontender, non-distended  MS: No edema; No deformity.  R arm with ecchymoses   Neuro:  Nonfocal  Psych: Normal affect   Labs    Chemistry Recent Labs  Lab 12/03/17 0302  12/04/17 0807 12/05/17 0351  NA 138 137 138  K 5.1 5.2* 5.6*  CL 113* 112* 113*  CO2 20* 18* 20*  GLUCOSE 100* 110* 97  BUN 49* 33* 32*  CREATININE 2.08* 1.82* 1.78*  CALCIUM 7.7* 7.9* 7.8*  ALBUMIN  --  2.8*  --   GFRNONAA 27* 32* 33*  GFRAA 32* 37* 38*  ANIONGAP 5 7 5      Hematology Recent Labs  Lab 12/03/17 0302 12/04/17 0807 12/05/17 0351  WBC 6.6 8.1 8.1  RBC 2.61* 2.63* 2.37*  HGB 8.8* 9.1* 8.0*  HCT 26.3* 26.7* 24.2*  MCV 100.8* 101.5* 102.1*  MCH 33.7 34.6* 33.8  MCHC 33.5 34.1 33.1  RDW 12.2 12.4 12.5  PLT 155 148* 157    Cardiac Enzymes Recent Labs  Lab 11/30/17 0017 11/30/17 0510 11/30/17 1126  TROPONINI 2.69* 7.33* 9.30*    Recent Labs  Lab 11/29/17 1940  TROPIPOC 1.84*     BNP Recent Labs  Lab 12/03/17 1141  BNP 201.8*     DDimer No results for input(s): DDIMER in the last 168 hours.   Radiology  US Renal  Result Date: 12/03/2017 CLINICAL DATA:  Acute kidney injury. EXAM: RENAL / URINARY TRACT ULTRASOUND COMPLETE COMPARISON:  Ultrasound of March 30, 2016. FINDINGS: Right Kidney: Length: 10.8 cm. Echogenicity within normal limits. No mass or hydronephrosis visualized. Left Kidney: Length: 11.1 cm. 3.8 cm cyst is noted in the renal pelvis. Echogenicity within normal limits. No mass or hydronephrosis visualized. Bladder: Decompressed secondary to Foley catheter. IMPRESSION: 3.8 cm left renal cyst. No other significant renal abnormality seen. Electronically Signed   By: Marijo Conception, M.D.   On: 12/03/2017 16:17   Dg Chest Port 1 View  Result Date: 12/03/2017 CLINICAL DATA:  Productive cough, CHF EXAM: PORTABLE CHEST 1 VIEW COMPARISON:  11/29/2017 FINDINGS: Calcified lymph nodes in the left hilum and mediastinum. Heart is normal size. Increasing linear densities in the lung bases, likely atelectasis. No effusions or acute bony abnormality. IMPRESSION: Bibasilar atelectasis. Electronically Signed   By: Rolm Baptise M.D.   On: 12/03/2017  11:34    Cardiac Studies    11/29/2017 Emergent Cardiac CathConclusion     There is mild left ventricular systolic dysfunction.  The left ventricular ejection fraction is 45-50% by visual estimate.  Prox RCA to Mid RCA lesion is 80% stenosed. Mid RCA to Dist RCA lesion is 100% stenosed with 95% stenosed side branch in Post Atrio.  Ost LM to Mid LM lesion is 40% stenosed.  Balloon angioplasty was attempted using a BALLOON SAPPHIRE 2.0X12. -- was unable to cross the 100% stenosis & no reduction in 80% lesion.  Mid Cx to Dist Cx lesion is 95% stenosed (thrombotic) with 55% stenosed side branch in Ost 2nd Mrg. -- LIKELY THE CULPRIT LESION  Ost LAD to Prox LAD lesion is 85% stenosed.   Severe three-vessel disease with 100% CTO mid RCA, severe 95% thrombotic lesion trifurcation lesion of the distal Cx-OM 2-LPL 1 and proximal LAD 85% eccentric lesion.  Unable to cross RCA lesion confirming CTO (PDA fills via collaterals from septal perforators)  Mildly reduced LVEF of roughly 45 to 50% with inferoapical hypokinesis  Since the patient was chest pain-free on medications, and the RCA appears to be chronically occluded with complex trifurcation circumflex lesion as a likely culprit, I felt it more prudent to stop at this point to allow time for further clarification of the best course of action going forward. His CKD would also warrant staged PCI if indicated as opposed to continued contrast use.   ------------------------------------------------------------------- 11/30/2017 ECHOStudy Conclusions  - Left ventricle: The cavity size was normal. There was mild focal basal hypertrophy of the septum. Systolic function was mildly reduced. The estimated ejection fraction was in the range of 45% to 50%. There is hypokinesis of the inferior myocardium. There was no evidence of elevated ventricular filling pressure by Doppler parameters. - Aortic valve: Valve mobility was  restricted. There was moderate regurgitation. - Mitral valve: There was mild to moderate regurgitation. - Right ventricle: The cavity size was mildly dilated. Wall thickness was normal.     Patient Profile      84 y.o. male whopresented with acute onset exertional dyspnea and diaphoresis and chest discomfort. He was initially noted to be in A. fib with subtle EKG changes, however with recurrence of active chest pain he developed1 to 2 mm inferior elevations as well as some lateral ST elevation.He was taken to Cath Lab because of these elevations and positive troponin.    Assessment & Plan    1  STEMI   Cath as  noted above   Pt has been seen by CV surgery   Not felt to be good candidate   Plan for PCI when renal function optimized  Of LAD and LCx Conitnue lipitor 80  2  Atrial fib   Remians in SR   Amiodarone dose tapered to 100 mg per day On metoprolol 12.5 bid   3  REnal   Cr continues to improve   1.78    Will follow   Hydrate prior to procedure    4  Hx BPH   Urinary retention   Has foley   PSA greater than 8   Will need urology f/u  5  Anemia  No evid of active bleeding   FOBT positive   Hgb dropped to 8   This am   Will recheck later today   May stop heparin  For questions or updates, please contact [facility] Please consult [email or web address] for contact info under Cardiology/STEMI.      Signed, Dorris Carnes, MD  12/05/2017, 9:10 AM

## 2017-12-05 NOTE — Progress Notes (Signed)
Patient ID: Paul Kramer, male   DOB: 05/30/1930, 84 y.o.   MRN: [id number]  [facility] Progress Note   Assessment/ Plan:   1.  Acute kidney injury on chronic kidney disease stage IV: Likely hemodynamically mediated versus from contrast nephropathy-continues to have good urine output without the need for diuretics.  Renal function back to baseline.  Potassium noted to be rising-suspected to be from resorption of his hematoma and will be treated with Veltassa.  Plan noted for repeat coronary angiogram with PCI of distal circumflex-appropriate "contrast prophylaxis" measures to be taken.  With 30% chance of acute kidney injury and 4-5% chance of needing dialysis. 2.  ST elevation MI: Coronary angiogram reports reviewed-chronically occluded mid RCA with significant distal circumflex stenosis as well as LAD lesion.  Not a candidate for CABG and plans noted for delayed PCI after hemodynamic/renal stabilization. 3.  Hyperkalemia: Secondary to resorption from hematoma-treat with Veltassa. 4.  Anemia: Subcutaneous ecchymosis/hematoma noted right arm status post coronary angiography without any associated compartment syndrome.  Continue to monitor for PRBC indications.  Subjective:   Reports to have slept well overnight, denies any complaints except for some discomfort from his Foley catheter.   Objective:   BP (!) 124/57 (BP Location: Left Arm)   Pulse (!) 58   Temp 97.9 F (36.6 C) (Oral)   Resp 18   Ht 5\' 5"  (1.651 m)   Wt 87.5 kg (192 lb 14.4 oz)   SpO2 97%   BMI 32.10 kg/m   Intake/Output Summary (Last 24 hours) at 12/05/2017 0820 Last data filed at 12/05/2017 0700 Gross per 24 hour  Intake 579.23 ml  Output 3075 ml  Net -2495.77 ml   Weight change: -2.4 kg (-5 lb 4.7 oz)  Physical Exam: Gen: Comfortably resting in bed, daughter at bedside CVS: Pulse regular bradycardia, S1 and S2 normal Resp: Diminished breath sounds over bases-no distinct rales or rhonchi Abd: Soft,  obese, nontender Ext: Trace ankle edema bilaterally.  Extensive ecchymosis/mild swelling right arm from recent hematoma  Imaging: US Renal  Result Date: 12/03/2017 CLINICAL DATA:  Acute kidney injury. EXAM: RENAL / URINARY TRACT ULTRASOUND COMPLETE COMPARISON:  Ultrasound of March 30, 2016. FINDINGS: Right Kidney: Length: 10.8 cm. Echogenicity within normal limits. No mass or hydronephrosis visualized. Left Kidney: Length: 11.1 cm. 3.8 cm cyst is noted in the renal pelvis. Echogenicity within normal limits. No mass or hydronephrosis visualized. Bladder: Decompressed secondary to Foley catheter. IMPRESSION: 3.8 cm left renal cyst. No other significant renal abnormality seen. Electronically Signed   By: Marijo Conception, M.D.   On: 12/03/2017 16:17   Dg Chest Port 1 View  Result Date: 12/03/2017 CLINICAL DATA:  Productive cough, CHF EXAM: PORTABLE CHEST 1 VIEW COMPARISON:  11/29/2017 FINDINGS: Calcified lymph nodes in the left hilum and mediastinum. Heart is normal size. Increasing linear densities in the lung bases, likely atelectasis. No effusions or acute bony abnormality. IMPRESSION: Bibasilar atelectasis. Electronically Signed   By: Rolm Baptise M.D.   On: 12/03/2017 11:34    Labs: BMET Recent Labs  Lab 11/29/17 1934 11/30/17 0510 12/01/17 3419 12/02/17 0109 12/03/17 0302 12/04/17 0807 12/05/17 0351  NA 138 138 137 138 138 137 138  K 5.2* 5.2* 5.4* 4.9 5.1 5.2* 5.6*  CL 115* 119* 113* 115* 113* 112* 113*  CO2 13* 14* 14* 17* 20* 18* 20*  GLUCOSE 162* 178* 147* 132* 100* 110* 97  BUN 52* 55* 60* 57* 49* 33* 32*  CREATININE 1.99* 1.84* 1.96*  2.00* 2.08* 1.82* 1.78*  CALCIUM 8.1* 7.7* 7.7* 7.7* 7.7* 7.9* 7.8*  PHOS  --   --   --   --   --  3.7  --    CBC Recent Labs  Lab 12/02/17 0109 12/03/17 0302 12/04/17 0807 12/05/17 0351  WBC 8.6 6.6 8.1 8.1  HGB 9.5* 8.8* 9.1* 8.0*  HCT 28.4*  28.8* 26.3* 26.7* 24.2*  MCV 103.3* 100.8* 101.5* 102.1*  PLT 169 155 148* 157    Medications:    . allopurinol  100 mg Oral BH-q7a  . amiodarone  100 mg Oral Daily  . aspirin  81 mg Oral Daily  . atorvastatin  80 mg Oral q1800  . darbepoetin (ARANESP) injection - NON-DIALYSIS  100 mcg Subcutaneous Q Fri-1800  . metoprolol tartrate  12.5 mg Oral BID  . pantoprazole  40 mg Oral Daily  . sodium chloride flush  3 mL Intravenous Q12H  . terazosin  4 mg Oral QPM  . vitamin B-12  1,000 mcg Oral Daily   Elmarie Shiley, MD 12/05/2017, 8:20 AM

## 2017-12-05 NOTE — Progress Notes (Signed)
[place] for Heparin Indication: atrial fibrillation and ACS  Allergies  Allergen Reactions  . Other Shortness Of Breath    Esters  . Contrast Media [Iodinated Diagnostic Agents] Other (See Comments)    Just feel bad all over  . Morphine And Related Other (See Comments)    Patient states he does not like to take Morphine as it gives him nightmares and hallucinations.     Patient Measurements: Height: 5\' 5"  (165.1 cm) Weight: 192 lb 14.4 oz (87.5 kg) IBW/kg (Calculated) : 61.5 Heparin Dosing Weight: 78kg  Vital Signs: Temp: 97.9 F (36.6 C) (06/08 0801) Temp Source: Oral (06/08 0801) BP: 124/57 (06/08 0801) Pulse Rate: 58 (06/08 0801)  Labs: Recent Labs    12/03/17 0302 12/04/17 0807 12/04/17 0817 12/04/17 1925 12/05/17 0351  HGB 8.8* 9.1*  --   --  8.0*  HCT 26.3* 26.7*  --   --  24.2*  PLT 155 148*  --   --  157  HEPARINUNFRC  --   --  0.15* 0.36 0.28*  CREATININE 2.08* 1.82*  --   --  1.78*  CKTOTAL  --  41*  --   --   --     Estimated Creatinine Clearance: 30.9 mL/min (A) (by C-G formula based on SCr of 1.78 mg/dL (H)).   Assessment: 52 YOM who presents with SOB and some chest tightness. New AFib on EKG, troponins are also elevated. Patient is not on anticoagulation PTA. He has large hematoma on right arm post cath; site wrapped this AM.  Heparin level just below goal this AM at 0.28.    Goal of Therapy:  Heparin level 0.3-0.7 units/ml Monitor platelets by anticoagulation protocol: Yes   Plan:   Increase heparin to 1100 units/hr. Continue daily heparin level and CBC. F/u plans for heparin after cath on Monday.  [place] Crane, Roylene Reason, [facility] Clinical Pharmacist Pager [phone]  12/05/2017 9:13 AM

## 2017-12-06 DIAGNOSIS — L8990 Pressure ulcer of unspecified site, unspecified stage: Secondary | ICD-10-CM

## 2017-12-06 LAB — BASIC METABOLIC PANEL
ANION GAP: 7 (ref 5–15)
BUN: 30 mg/dL — AB (ref 6–20)
CALCIUM: 8.3 mg/dL — AB (ref 8.9–10.3)
CO2: 19 mmol/L — ABNORMAL LOW (ref 22–32)
Chloride: 112 mmol/L — ABNORMAL HIGH (ref 101–111)
Creatinine, Ser: 1.74 mg/dL — ABNORMAL HIGH (ref 0.61–1.24)
GFR calc Af Amer: 39 mL/min — ABNORMAL LOW (ref >60)
GFR, EST NON AFRICAN AMERICAN: 34 mL/min — AB (ref >60)
GLUCOSE: 107 mg/dL — AB (ref 65–99)
Potassium: 5.5 mmol/L — ABNORMAL HIGH (ref 3.5–5.1)
SODIUM: 138 mmol/L (ref 135–145)

## 2017-12-06 LAB — CBC
HCT: 25.3 % — ABNORMAL LOW (ref 39.0–52.0)
Hemoglobin: 8.3 g/dL — ABNORMAL LOW (ref 13.0–17.0)
MCH: 33.9 pg (ref 26.0–34.0)
MCHC: 32.8 g/dL (ref 30.0–36.0)
MCV: 103.3 fL — AB (ref 78.0–100.0)
PLATELETS: 155 10*3/uL (ref 150–400)
RBC: 2.45 MIL/uL — ABNORMAL LOW (ref 4.22–5.81)
RDW: 12.4 % (ref 11.5–15.5)
WBC: 8.4 10*3/uL (ref 4.0–10.5)

## 2017-12-06 LAB — HEPARIN LEVEL (UNFRACTIONATED): HEPARIN UNFRACTIONATED: 0.31 [IU]/mL (ref 0.30–0.70)

## 2017-12-06 MED ORDER — CLOPIDOGREL BISULFATE 300 MG PO TABS
300.0000 mg | ORAL_TABLET | Freq: Once | ORAL | Status: AC
  Administered 2017-12-06: 300 mg via ORAL
  Filled 2017-12-06: qty 1

## 2017-12-06 MED ORDER — SODIUM CHLORIDE 0.9 % IV SOLN
INTRAVENOUS | Status: DC
  Administered 2017-12-06 – 2017-12-07 (×2): via INTRAVENOUS

## 2017-12-06 MED ORDER — SODIUM CHLORIDE 0.9% FLUSH
3.0000 mL | Freq: Two times a day (BID) | INTRAVENOUS | Status: DC
  Administered 2017-12-07: 3 mL via INTRAVENOUS

## 2017-12-06 MED ORDER — ASPIRIN 81 MG PO CHEW
81.0000 mg | CHEWABLE_TABLET | ORAL | Status: AC
  Administered 2017-12-07: 81 mg via ORAL
  Filled 2017-12-06: qty 1

## 2017-12-06 MED ORDER — ACETAMINOPHEN 325 MG PO TABS
650.0000 mg | ORAL_TABLET | Freq: Four times a day (QID) | ORAL | Status: DC | PRN
  Administered 2017-12-06 – 2017-12-10 (×3): 650 mg via ORAL
  Filled 2017-12-06 (×3): qty 2

## 2017-12-06 MED ORDER — CLOPIDOGREL BISULFATE 75 MG PO TABS
75.0000 mg | ORAL_TABLET | Freq: Every day | ORAL | Status: DC
  Administered 2017-12-07 – 2017-12-11 (×5): 75 mg via ORAL
  Filled 2017-12-06 (×6): qty 1

## 2017-12-06 MED ORDER — POLYETHYLENE GLYCOL 3350 17 G PO PACK
17.0000 g | PACK | Freq: Every day | ORAL | Status: DC
  Administered 2017-12-06 – 2017-12-10 (×3): 17 g via ORAL
  Filled 2017-12-06 (×6): qty 1

## 2017-12-06 MED ORDER — SODIUM CHLORIDE 0.9 % IV SOLN: 250.0000 mL | INTRAVENOUS | Status: DC | PRN

## 2017-12-06 MED ORDER — PATIROMER SORBITEX CALCIUM 8.4 G PO PACK
16.8000 g | PACK | Freq: Two times a day (BID) | ORAL | Status: AC
  Administered 2017-12-06 (×2): 16.8 g via ORAL
  Filled 2017-12-06 (×3): qty 2

## 2017-12-06 MED ORDER — SODIUM CHLORIDE 0.9% FLUSH: 3.0000 mL | INTRAVENOUS | Status: DC | PRN

## 2017-12-06 NOTE — Progress Notes (Addendum)
Progress Note  Patient Name: Paul Kramer Date of Encounter: 12/06/2017  Primary Cardiologist: Glenetta Hew, MD   Subjective  No CP  Breathing iis OK   Inpatient Medications    Scheduled Meds: . allopurinol  100 mg Oral BH-q7a  . amiodarone  100 mg Oral Daily  . aspirin  81 mg Oral Daily  . atorvastatin  80 mg Oral q1800  . darbepoetin (ARANESP) injection - NON-DIALYSIS  100 mcg Subcutaneous Q Fri-1800  . metoprolol tartrate  12.5 mg Oral BID  . pantoprazole  40 mg Oral Daily  . patiromer  16.8 g Oral BID  . polyethylene glycol  17 g Oral Daily  . sodium chloride flush  3 mL Intravenous Q12H  . terazosin  4 mg Oral QPM  . vitamin B-12  1,000 mcg Oral Daily   Continuous Infusions: . heparin 1,100 Units/hr (12/06/17 0400)   PRN Meds: acetaminophen, magnesium hydroxide, nitroGLYCERIN, ondansetron (ZOFRAN) IV, simethicone, sodium chloride flush   Vital Signs    Vitals:   12/06/17 0312 12/06/17 0400 12/06/17 0521 12/06/17 0825  BP: (!) 122/55   120/62  Pulse: (!) 55 (!) 51  (!) 53  Resp: 16 17  15   Temp: 97.8 F (36.6 C)   97.6 F (36.4 C)  TempSrc: Oral   Oral  SpO2: 99% 97%  97%  Weight:   86.9 kg (191 lb 8 oz)   Height:        Intake/Output Summary (Last 24 hours) at 12/06/2017 1004 Last data filed at 12/06/2017 0700 Gross per 24 hour  Intake 878 ml  Output 3200 ml  Net -2322 ml    Net I/O   -3.3 L   Filed Weights   12/03/17 0900 12/05/17 0638 12/06/17 0521  Weight: 89.9 kg (198 lb 3.1 oz) 87.5 kg (192 lb 14.4 oz) 86.9 kg (191 lb 8 oz)    Telemetry    Sinus bradycardia   - Personally Reviewed  ECG      Physical Exam   GEN: No acute distress.   Neck: JVP nromall   Cardiac: RRR, no murmurs, rubs, or gallops.  Respiratory: Clear to auscultation bilaterally. GI: Soft, nontender, non-distended  MS: No edema; No deformity.  R arm with ecchymoses  Soft   Neuro:  Nonfocal  Psych: Normal affect   Labs    Chemistry Recent Labs  Lab  12/04/17 0807 12/05/17 0351 12/06/17 0228  NA 137 138 138  K 5.2* 5.6* 5.5*  CL 112* 113* 112*  CO2 18* 20* 19*  GLUCOSE 110* 97 107*  BUN 33* 32* 30*  CREATININE 1.82* 1.78* 1.74*  CALCIUM 7.9* 7.8* 8.3*  ALBUMIN 2.8*  --   --   GFRNONAA 32* 33* 34*  GFRAA 37* 38* 39*  ANIONGAP 7 5 7      Hematology Recent Labs  Lab 12/05/17 0351 12/05/17 1638 12/06/17 0228  WBC 8.1 7.4 8.4  RBC 2.37* 2.55* 2.45*  HGB 8.0* 8.7* 8.3*  HCT 24.2* 26.1* 25.3*  MCV 102.1* 102.4* 103.3*  MCH 33.8 34.1* 33.9  MCHC 33.1 33.3 32.8  RDW 12.5 12.6 12.4  PLT 157 171 155    Cardiac Enzymes Recent Labs  Lab 11/30/17 0017 11/30/17 0510 11/30/17 1126  TROPONINI 2.69* 7.33* 9.30*    Recent Labs  Lab 11/29/17 1940  TROPIPOC 1.84*     BNP Recent Labs  Lab 12/03/17 1141  BNP 201.8*     DDimer No results for input(s): DDIMER in the last 168 hours.  Radiology    No results found.  Cardiac Studies    11/29/2017 Emergent Cardiac CathConclusion     There is mild left ventricular systolic dysfunction.  The left ventricular ejection fraction is 45-50% by visual estimate.  Prox RCA to Mid RCA lesion is 80% stenosed. Mid RCA to Dist RCA lesion is 100% stenosed with 95% stenosed side branch in Post Atrio.  Ost LM to Mid LM lesion is 40% stenosed.  Balloon angioplasty was attempted using a BALLOON SAPPHIRE 2.0X12. -- was unable to cross the 100% stenosis & no reduction in 80% lesion.  Mid Cx to Dist Cx lesion is 95% stenosed (thrombotic) with 55% stenosed side branch in Ost 2nd Mrg. -- LIKELY THE CULPRIT LESION  Ost LAD to Prox LAD lesion is 85% stenosed.   Severe three-vessel disease with 100% CTO mid RCA, severe 95% thrombotic lesion trifurcation lesion of the distal Cx-OM 2-LPL 1 and proximal LAD 85% eccentric lesion.  Unable to cross RCA lesion confirming CTO (PDA fills via collaterals from septal perforators)  Mildly reduced LVEF of roughly 45 to 50% with inferoapical  hypokinesis  Since the patient was chest pain-free on medications, and the RCA appears to be chronically occluded with complex trifurcation circumflex lesion as a likely culprit, I felt it more prudent to stop at this point to allow time for further clarification of the best course of action going forward. His CKD would also warrant staged PCI if indicated as opposed to continued contrast use.   ------------------------------------------------------------------- 11/30/2017 ECHOStudy Conclusions  - Left ventricle: The cavity size was normal. There was mild focal basal hypertrophy of the septum. Systolic function was mildly reduced. The estimated ejection fraction was in the range of 45% to 50%. There is hypokinesis of the inferior myocardium. There was no evidence of elevated ventricular filling pressure by Doppler parameters. - Aortic valve: Valve mobility was restricted. There was moderate regurgitation. - Mitral valve: There was mild to moderate regurgitation. - Right ventricle: The cavity size was mildly dilated. Wall thickness was normal.     Patient Profile      84 y.o. male whopresented with acute onset exertional dyspnea and diaphoresis and chest discomfort. He was initially noted to be in A. fib with subtle EKG changes, however with recurrence of active chest pain he developed1 to 2 mm inferior elevations as well as some lateral ST elevation.He was taken to Cath Lab because of these elevations and positive troponin.    Assessment & Plan    1  STEMI   Cath as noted above   Pt has been seen by CV surgery   Not felt to be good candidate   Plan for PCI when renal function optimized  Of LAD and LCx    Will hydrate prior      2  Atrial fib   Remians in SR     On Amiodarone; dose tapered to 100 mg per day Also on metoprolol 12.5 bid   BP is OK    3  REnal   Cr 1.74    Will follow   Hydrate prior to procedure    4  Hx BPH   Urinary retention   Has foley    PSA greater than 8   Will need urology f/u  5  Anemia  Hgb 8.3   FOBT positive    Follow closely since on heparin     For questions or updates, please contact [facility] Please consult [email or web address] for contact info under Cardiology/STEMI.  Signed, Dorris Carnes, MD  12/06/2017, 10:04 AM

## 2017-12-06 NOTE — Progress Notes (Signed)
Patient ID: Paul Kramer, male   DOB: [birth year], 84 y.o.   MRN: [id number]  [facility] Progress Note   Assessment/ Plan:   1.  Acute kidney injury on chronic kidney disease stage IV: Likely hemodynamically mediated versus from contrast nephropathy-nonoliguric and renal function is back to baseline.  Suspect hyperkalemia is from resorption of his hematoma and is being treated with Veltassa.  Plan noted for repeat coronary angiogram with PCI of distal circumflex-appropriate "contrast prophylaxis" measures to be taken.  With 30% chance of acute kidney injury and 4-5% chance of needing dialysis. 2.  ST elevation MI: Coronary angiogram reports reviewed-chronically occluded mid RCA with significant distal circumflex stenosis as well as LAD lesion.  Not a candidate for CABG and plans noted for delayed PCI after hemodynamic/renal stabilization. 3.  Hyperkalemia: Secondary to resorption from hematoma-continue ongoing treatment with Veltassa. 4.  Anemia: Subcutaneous ecchymosis/hematoma noted right arm status post coronary angiography without any associated compartment syndrome.  Continue to monitor for PRBC indications.  Subjective:   Complains of some constipation and "circular" back pain.  Denies any chest pain or shortness of breath.  Objective:   BP (!) 122/55 (BP Location: Left Arm)   Pulse (!) 51   Temp 97.8 F (36.6 C) (Oral)   Resp 17   Ht 5\' 5"  (1.651 m)   Wt 86.9 kg (191 lb 8 oz)   SpO2 97%   BMI 31.87 kg/m   Intake/Output Summary (Last 24 hours) at 12/06/2017 0816 Last data filed at 12/06/2017 0630 Gross per 24 hour  Intake 1286 ml  Output 4100 ml  Net -2814 ml   Weight change: -0.636 kg (-1 lb 6.4 oz)  Physical Exam: Gen: Comfortably resting in recliner, daughter at side  CVS: Pulse regular bradycardia, S1 and S2 normal Resp: Clear to auscultation bilaterally, no rales/rhonchi Abd: Soft, obese, nontender Ext: Trace ankle edema bilaterally.  Extensive  ecchymosis/mild swelling right arm from recent hematoma  Imaging: No results found.  Labs: BMET Recent Labs  Lab 11/30/17 0510 12/01/17 6967 12/02/17 0109 12/03/17 0302 12/04/17 0807 12/05/17 0351 12/06/17 0228  NA 138 137 138 138 137 138 138  K 5.2* 5.4* 4.9 5.1 5.2* 5.6* 5.5*  CL 119* 113* 115* 113* 112* 113* 112*  CO2 14* 14* 17* 20* 18* 20* 19*  GLUCOSE 178* 147* 132* 100* 110* 97 107*  BUN 55* 60* 57* 49* 33* 32* 30*  CREATININE 1.84* 1.96* 2.00* 2.08* 1.82* 1.78* 1.74*  CALCIUM 7.7* 7.7* 7.7* 7.7* 7.9* 7.8* 8.3*  PHOS  --   --   --   --  3.7  --   --    CBC Recent Labs  Lab 12/04/17 0807 12/05/17 0351 12/05/17 1638 12/06/17 0228  WBC 8.1 8.1 7.4 8.4  NEUTROABS  --   --  4.9  --   HGB 9.1* 8.0* 8.7* 8.3*  HCT 26.7* 24.2* 26.1* 25.3*  MCV 101.5* 102.1* 102.4* 103.3*  PLT 148* 157 171 155   Medications:    . allopurinol  100 mg Oral BH-q7a  . amiodarone  100 mg Oral Daily  . aspirin  81 mg Oral Daily  . atorvastatin  80 mg Oral q1800  . darbepoetin (ARANESP) injection - NON-DIALYSIS  100 mcg Subcutaneous Q Fri-1800  . metoprolol tartrate  12.5 mg Oral BID  . pantoprazole  40 mg Oral Daily  . patiromer  8.4 g Oral BID  . sodium chloride flush  3 mL Intravenous Q12H  . terazosin  4  mg Oral QPM  . vitamin B-12  1,000 mcg Oral Daily   Elmarie Shiley, MD 12/06/2017, 8:16 AM

## 2017-12-06 NOTE — Progress Notes (Signed)
[place] for Heparin Indication: atrial fibrillation and ACS  Allergies  Allergen Reactions  . Other Shortness Of Breath    Esters  . Contrast Media [Iodinated Diagnostic Agents] Other (See Comments)    Just feel bad all over  . Morphine And Related Other (See Comments)    Patient states he does not like to take Morphine as it gives him nightmares and hallucinations.     Patient Measurements: Height: 5\' 5"  (165.1 cm) Weight: 191 lb 8 oz (86.9 kg) IBW/kg (Calculated) : 61.5 Heparin Dosing Weight: 78kg  Vital Signs: Temp: 97.6 F (36.4 C) (06/09 0825) Temp Source: Oral (06/09 0825) BP: 120/62 (06/09 0825) Pulse Rate: 53 (06/09 0825)  Labs: Recent Labs    12/04/17 0807  12/04/17 1925 12/05/17 0351 12/05/17 1638 12/06/17 0228  HGB 9.1*  --   --  8.0* 8.7* 8.3*  HCT 26.7*  --   --  24.2* 26.1* 25.3*  PLT 148*  --   --  157 171 155  HEPARINUNFRC  --    < > 0.36 0.28*  --  0.31  CREATININE 1.82*  --   --  1.78*  --  1.74*  CKTOTAL 41*  --   --   --   --   --    < > = values in this interval not displayed.    Estimated Creatinine Clearance: 31.5 mL/min (A) (by C-G formula based on SCr of 1.74 mg/dL (H)).   Assessment: 13 YOM who presents with SOB and some chest tightness. New AFib on EKG, troponins are also elevated. Patient is not on anticoagulation PTA. He has large hematoma on right arm post cath, stable per md.   Heparin level at goal this AM at 0.31. No issues noted overnight.   Goal of Therapy:  Heparin level 0.3-0.7 units/ml Monitor platelets by anticoagulation protocol: Yes   Plan:   Continue heparin at 1100 units/hr. Continue daily heparin level and CBC.  Erin Hearing PharmD., BCPS Clinical Pharmacist 12/06/2017 9:44 AM

## 2017-12-07 ENCOUNTER — Inpatient Hospital Stay (HOSPITAL_COMMUNITY)
Admission: EM | Disposition: A | Discharge status: Home / Self Care | Payer: Self-pay | Source: Home / Self Care | Attending: Cardiology

## 2017-12-07 DIAGNOSIS — I25110 Atherosclerotic heart disease of native coronary artery with unstable angina pectoris: Secondary | ICD-10-CM

## 2017-12-07 HISTORY — PX: CORONARY STENT INTERVENTION: CATH118234

## 2017-12-07 HISTORY — PX: LEFT HEART CATH: CATH118248

## 2017-12-07 LAB — ABO/RH: ABO/RH(D): O POS

## 2017-12-07 LAB — POCT ACTIVATED CLOTTING TIME
ACTIVATED CLOTTING TIME: 252 s
ACTIVATED CLOTTING TIME: 285 s
ACTIVATED CLOTTING TIME: 224 s
ACTIVATED CLOTTING TIME: 274 s
ACTIVATED CLOTTING TIME: 202 s
Activated Clotting Time: 186 seconds
Activated Clotting Time: 169 seconds

## 2017-12-07 LAB — BASIC METABOLIC PANEL
Anion gap: 5 (ref 5–15)
BUN: 29 mg/dL — ABNORMAL HIGH (ref 6–20)
CALCIUM: 8.4 mg/dL — AB (ref 8.9–10.3)
CO2: 21 mmol/L — AB (ref 22–32)
CREATININE: 1.81 mg/dL — AB (ref 0.61–1.24)
Chloride: 111 mmol/L (ref 101–111)
GFR calc Af Amer: 38 mL/min — ABNORMAL LOW (ref >60)
GFR calc non Af Amer: 32 mL/min — ABNORMAL LOW (ref >60)
Glucose, Bld: 104 mg/dL — ABNORMAL HIGH (ref 65–99)
Potassium: 5.4 mmol/L — ABNORMAL HIGH (ref 3.5–5.1)
Sodium: 137 mmol/L (ref 135–145)

## 2017-12-07 LAB — CBC
HCT: 25.6 % — ABNORMAL LOW (ref 39.0–52.0)
HEMOGLOBIN: 8.3 g/dL — AB (ref 13.0–17.0)
MCH: 33.6 pg (ref 26.0–34.0)
MCHC: 32.4 g/dL (ref 30.0–36.0)
MCV: 103.6 fL — ABNORMAL HIGH (ref 78.0–100.0)
Platelets: 170 10*3/uL (ref 150–400)
RBC: 2.47 MIL/uL — ABNORMAL LOW (ref 4.22–5.81)
RDW: 12.6 % (ref 11.5–15.5)
WBC: 7.7 10*3/uL (ref 4.0–10.5)

## 2017-12-07 LAB — PROTIME-INR
INR: 1.11
Prothrombin Time: 14.3 seconds (ref 11.4–15.2)

## 2017-12-07 LAB — HEPARIN LEVEL (UNFRACTIONATED): HEPARIN UNFRACTIONATED: 0.29 [IU]/mL — AB (ref 0.30–0.70)

## 2017-12-07 LAB — PARATHYROID HORMONE, INTACT (NO CA): PTH: UNDETERMINED pg/mL

## 2017-12-07 LAB — PREPARE RBC (CROSSMATCH)

## 2017-12-07 LAB — MAGNESIUM: Magnesium: 1.3 mg/dL — ABNORMAL LOW (ref 1.7–2.4)

## 2017-12-07 SURGERY — CORONARY STENT INTERVENTION
Anesthesia: LOCAL

## 2017-12-07 MED ORDER — FAMOTIDINE IN NACL 20-0.9 MG/50ML-% IV SOLN
INTRAVENOUS | Status: AC
  Filled 2017-12-07: qty 50

## 2017-12-07 MED ORDER — HEPARIN SODIUM (PORCINE) 1000 UNIT/ML IJ SOLN
INTRAMUSCULAR | Status: AC
  Filled 2017-12-07: qty 1

## 2017-12-07 MED ORDER — LABETALOL HCL 5 MG/ML IV SOLN: 10.0000 mg | INTRAVENOUS | Status: AC | PRN

## 2017-12-07 MED ORDER — NITROGLYCERIN 1 MG/10 ML FOR IR/CATH LAB
INTRA_ARTERIAL | Status: AC
  Filled 2017-12-07: qty 10

## 2017-12-07 MED ORDER — HEPARIN SODIUM (PORCINE) 1000 UNIT/ML IJ SOLN
INTRAMUSCULAR | Status: DC | PRN
  Administered 2017-12-07: 3000 [IU] via INTRAVENOUS
  Administered 2017-12-07: 7500 [IU] via INTRAVENOUS
  Administered 2017-12-07: 4000 [IU] via INTRAVENOUS

## 2017-12-07 MED ORDER — MIDAZOLAM HCL 2 MG/2ML IJ SOLN
INTRAMUSCULAR | Status: DC | PRN
  Administered 2017-12-07: 1 mg via INTRAVENOUS

## 2017-12-07 MED ORDER — FENTANYL CITRATE (PF) 100 MCG/2ML IJ SOLN
INTRAMUSCULAR | Status: AC
  Filled 2017-12-07: qty 2

## 2017-12-07 MED ORDER — DIPHENHYDRAMINE HCL 50 MG/ML IJ SOLN
25.0000 mg | Freq: Once | INTRAMUSCULAR | Status: AC
  Administered 2017-12-07: 25 mg via INTRAVENOUS
  Filled 2017-12-07: qty 1

## 2017-12-07 MED ORDER — HEPARIN (PORCINE) IN NACL 2-0.9 UNITS/ML
INTRAMUSCULAR | Status: AC | PRN
  Administered 2017-12-07 (×2): 500 mL

## 2017-12-07 MED ORDER — SODIUM CHLORIDE 0.9 % IV SOLN: 250.0000 mL | INTRAVENOUS | Status: DC | PRN

## 2017-12-07 MED ORDER — NITROGLYCERIN 1 MG/10 ML FOR IR/CATH LAB
INTRA_ARTERIAL | Status: DC | PRN
  Administered 2017-12-07 (×2): 200 ug via INTRACORONARY

## 2017-12-07 MED ORDER — FENTANYL CITRATE (PF) 100 MCG/2ML IJ SOLN
INTRAMUSCULAR | Status: DC | PRN
  Administered 2017-12-07: 25 ug via INTRAVENOUS

## 2017-12-07 MED ORDER — SODIUM CHLORIDE 0.9% FLUSH: 3.0000 mL | INTRAVENOUS | Status: DC | PRN

## 2017-12-07 MED ORDER — SODIUM CHLORIDE 0.9 % IV SOLN
INTRAVENOUS | Status: AC
  Administered 2017-12-07: 19:00:00 via INTRAVENOUS

## 2017-12-07 MED ORDER — LIDOCAINE HCL (PF) 1 % IJ SOLN
INTRAMUSCULAR | Status: AC
  Filled 2017-12-07: qty 30

## 2017-12-07 MED ORDER — HYDRALAZINE HCL 20 MG/ML IJ SOLN
5.0000 mg | INTRAMUSCULAR | Status: AC | PRN
  Administered 2017-12-07: 21:00:00 5 mg via INTRAVENOUS
  Filled 2017-12-07: qty 1

## 2017-12-07 MED ORDER — SODIUM CHLORIDE 0.9% FLUSH
3.0000 mL | Freq: Two times a day (BID) | INTRAVENOUS | Status: DC
  Administered 2017-12-08 – 2017-12-10 (×3): 3 mL via INTRAVENOUS

## 2017-12-07 MED ORDER — LIDOCAINE HCL (PF) 1 % IJ SOLN
INTRAMUSCULAR | Status: DC | PRN
  Administered 2017-12-07: 18 mL

## 2017-12-07 MED ORDER — METHYLPREDNISOLONE SODIUM SUCC 125 MG IJ SOLR
125.0000 mg | Freq: Once | INTRAMUSCULAR | Status: AC
  Administered 2017-12-07: 125 mg via INTRAVENOUS
  Filled 2017-12-07: qty 2

## 2017-12-07 MED ORDER — MIDAZOLAM HCL 2 MG/2ML IJ SOLN
INTRAMUSCULAR | Status: AC
  Filled 2017-12-07: qty 2

## 2017-12-07 MED ORDER — FAMOTIDINE IN NACL 20-0.9 MG/50ML-% IV SOLN
INTRAVENOUS | Status: AC | PRN
  Administered 2017-12-07: 20 mg via INTRAVENOUS

## 2017-12-07 MED ORDER — IOHEXOL 350 MG/ML SOLN
INTRAVENOUS | Status: DC | PRN
  Administered 2017-12-07: 160 mL

## 2017-12-07 MED ORDER — SODIUM CHLORIDE 0.9 % IV SOLN: Freq: Once | INTRAVENOUS | Status: DC

## 2017-12-07 MED ORDER — PATIROMER SORBITEX CALCIUM 8.4 G PO PACK
16.8000 g | PACK | Freq: Once | ORAL | Status: AC
  Administered 2017-12-07: 16.8 g via ORAL
  Filled 2017-12-07: qty 2

## 2017-12-07 MED ORDER — HEPARIN (PORCINE) IN NACL 1000-0.9 UT/500ML-% IV SOLN
INTRAVENOUS | Status: AC
  Filled 2017-12-07: qty 1000

## 2017-12-07 SURGICAL SUPPLY — 22 items
BALLN SAPPHIRE 2.5X12 (BALLOONS) ×2
BALLN SAPPHIRE ~~LOC~~ 3.5X12 (BALLOONS) ×2 IMPLANT
BALLN WOLVERINE 2.50X10 (BALLOONS) ×2
BALLOON SAPPHIRE 2.5X12 (BALLOONS) ×1 IMPLANT
BALLOON WOLVERINE 2.50X10 (BALLOONS) ×1 IMPLANT
CATH INFINITI 5FR ANG PIGTAIL (CATHETERS) ×2 IMPLANT
CATH VISTA GUIDE 7FRXB LAD 3.5 (CATHETERS) ×4 IMPLANT
COVER PRB 48X5XTLSCP FOLD TPE (BAG) ×1 IMPLANT
COVER PROBE 5X48 (BAG) ×1
ELECT DEFIB PAD ADLT CADENCE (PAD) ×2 IMPLANT
KIT ENCORE 26 ADVANTAGE (KITS) ×4 IMPLANT
KIT HEART LEFT (KITS) ×2 IMPLANT
KIT HEMO VALVE WATCHDOG (MISCELLANEOUS) ×2 IMPLANT
PACK CARDIAC CATHETERIZATION (CUSTOM PROCEDURE TRAY) ×2 IMPLANT
SHEATH AVANTI 11CM 7FR (SHEATH) ×2 IMPLANT
STENT SYNERGY DES 3X16 (Permanent Stent) ×4 IMPLANT
TRANSDUCER W/STOPCOCK (MISCELLANEOUS) ×2 IMPLANT
TUBING CIL FLEX 10 FLL-RA (TUBING) ×2 IMPLANT
WIRE ASAHI PROWATER 180CM (WIRE) ×2 IMPLANT
WIRE EMERALD 3MM-J .035X150CM (WIRE) ×2 IMPLANT
WIRE HI TORQ VERSACORE-J 145CM (WIRE) ×2 IMPLANT
WIRE MARVEL STR TIP 190CM (WIRE) ×2 IMPLANT

## 2017-12-07 NOTE — H&P (View-Only) (Signed)
Progress Note  Patient Name: Paul Kramer Date of Encounter: 12/07/2017  Primary Cardiologist: Dr. Glenetta Hew, MD  Subjective   Pt feeling well this AM. Mild chest discomfort overnight. One episode of bradycardia to the mid 40's overnight as well.   Inpatient Medications    Scheduled Meds: . allopurinol  100 mg Oral BH-q7a  . amiodarone  100 mg Oral Daily  . aspirin  81 mg Oral Daily  . atorvastatin  80 mg Oral q1800  . clopidogrel  75 mg Oral Daily  . darbepoetin (ARANESP) injection - NON-DIALYSIS  100 mcg Subcutaneous Q Fri-1800  . metoprolol tartrate  12.5 mg Oral BID  . pantoprazole  40 mg Oral Daily  . polyethylene glycol  17 g Oral Daily  . sodium chloride flush  3 mL Intravenous Q12H  . sodium chloride flush  3 mL Intravenous Q12H  . terazosin  4 mg Oral QPM  . vitamin B-12  1,000 mcg Oral Daily   Continuous Infusions: . sodium chloride    . sodium chloride 75 mL/hr at 12/07/17 0700  . heparin 1,100 Units/hr (12/07/17 0400)   PRN Meds: sodium chloride, acetaminophen, magnesium hydroxide, nitroGLYCERIN, ondansetron (ZOFRAN) IV, simethicone, sodium chloride flush, sodium chloride flush   Vital Signs    Vitals:   12/07/17 0350 12/07/17 0400 12/07/17 0657 12/07/17 0704  BP: (!) 112/51   (!) 142/63  Pulse: (!) 52 (!) 55 (!) 58 (!) 54  Resp: 18 20 19 18   Temp:    98.4 F (36.9 C)  TempSrc:    Oral  SpO2: 95% 97% 97% 97%  Weight:   190 lb 7.6 oz (86.4 kg)   Height:        Intake/Output Summary (Last 24 hours) at 12/07/2017 0925 Last data filed at 12/07/2017 0902 Gross per 24 hour  Intake 1566.5 ml  Output 1975 ml  Net -408.5 ml   Filed Weights   12/05/17 0638 12/06/17 0521 12/07/17 0657  Weight: 192 lb 14.4 oz (87.5 kg) 191 lb 8 oz (86.9 kg) 190 lb 7.6 oz (86.4 kg)    Physical Exam   General: Well developed, well nourished, NAD Skin: Warm, dry, intact  Head: Normocephalic, atraumatic, clear, moist mucus membranes. Neck: Negative for carotid  bruits. No JVD Lungs:Clear to ausculation bilaterally. No wheezes, rales, or rhonchi. Breathing is unlabored. Cardiovascular: RRR with S1 S2. No murmurs, rubs or gallops Abdomen: Soft, non-tender, non-distended with normoactive bowel sounds. No obvious abdominal masses. MSK: Strength and tone appear normal for age. 5/5 in all extremities Extremities: No edema. No clubbing or cyanosis. DP/PT pulses 2+ bilaterally Neuro: Alert and oriented. No focal deficits. No facial asymmetry. MAE spontaneously. Psych: Responds to questions appropriately with normal affect.    Labs    Chemistry Recent Labs  Lab 12/04/17 0807 12/05/17 0351 12/06/17 0228  NA 137 138 138  K 5.2* 5.6* 5.5*  CL 112* 113* 112*  CO2 18* 20* 19*  GLUCOSE 110* 97 107*  BUN 33* 32* 30*  CREATININE 1.82* 1.78* 1.74*  CALCIUM 7.9* 7.8* 8.3*  ALBUMIN 2.8*  --   --   GFRNONAA 32* 33* 34*  GFRAA 37* 38* 39*  ANIONGAP 7 5 7      Hematology Recent Labs  Lab 12/05/17 1638 12/06/17 0228 12/07/17 0231  WBC 7.4 8.4 7.7  RBC 2.55* 2.45* 2.47*  HGB 8.7* 8.3* 8.3*  HCT 26.1* 25.3* 25.6*  MCV 102.4* 103.3* 103.6*  MCH 34.1* 33.9 33.6  MCHC 33.3 32.8  32.4  RDW 12.6 12.4 12.6  PLT 171 155 170    Cardiac Enzymes Recent Labs  Lab 11/30/17 1126  TROPONINI 9.30*   No results for input(s): TROPIPOC in the last 168 hours.   BNP Recent Labs  Lab 12/03/17 1141  BNP 201.8*     DDimer No results for input(s): DDIMER in the last 168 hours.   Radiology    No results found.  Telemetry    12/07/17 NSR/SB HR  - Personally Reviewed  ECG    No new tracings as of  12/07/17- Personally Reviewed  Cardiac Studies   11/29/2017 Emergent Cardiac Cath:    There is mild left ventricular systolic dysfunction.  The left ventricular ejection fraction is 45-50% by visual estimate.  Prox RCA to Mid RCA lesion is 80% stenosed. Mid RCA to Dist RCA lesion is 100% stenosed with 95% stenosed side branch in Post Atrio.  Ost LM  to Mid LM lesion is 40% stenosed.  Balloon angioplasty was attempted using a BALLOON SAPPHIRE 2.0X12. -- was unable to cross the 100% stenosis & no reduction in 80% lesion.  Mid Cx to Dist Cx lesion is 95% stenosed (thrombotic) with 55% stenosed side branch in Ost 2nd Mrg. -- LIKELY THE CULPRIT LESION  Ost LAD to Prox LAD lesion is 85% stenosed.   Severe three-vessel disease with 100% CTO mid RCA, severe 95% thrombotic lesion trifurcation lesion of the distal Cx-OM 2-LPL 1 and proximal LAD 85% eccentric lesion.  Unable to cross RCA lesion confirming CTO (PDA fills via collaterals from septal perforators)  Mildly reduced LVEF of roughly 45 to 50% with inferoapical hypokinesis  Since the patient was chest pain-free on medications, and the RCA appears to be chronically occluded with complex trifurcation circumflex lesion as a likely culprit, I felt it more prudent to stop at this point to allow time for further clarification of the best course of action going forward. His CKD would also warrant staged PCI if indicated as opposed to continued contrast use.   ------------------------------------------------------------------- 11/30/2017 ECHO:  - Left ventricle: The cavity size was normal. There was mild focal basal hypertrophy of the septum. Systolic function was mildly reduced. The estimated ejection fraction was in the range of 45% to 50%. There is hypokinesis of the inferior myocardium. There was no evidence of elevated ventricular filling pressure by Doppler parameters. - Aortic valve: Valve mobility was restricted. There was moderate regurgitation. - Mitral valve: There was mild to moderate regurgitation. - Right ventricle: The cavity size was mildly dilated. Wall thickness was normal.  Patient Profile     84 y.o. male whopresented with acute onset exertional dyspnea and diaphoresis and chest discomfort. He was initially noted to be in A. fib with subtle EKG  changes, however with recurrence of active chest pain he developed1 to 2 mm inferior elevations as well as some lateral ST elevation.He was taken to Cath Lab because of these elevations and positive troponin.  Assessment & Plan    1. STEMI: -Patient presented 11/29/2017 with concern for STEMI>>taken emergently to the Cath Lab which revealed severe three-vessel disease with CTO of RCA, severe 95% thrombotic lesion trifurcation lesion of the distal Cx-OM 2-LPL 1 and proximal LAD 85% eccentric lesion.  TCTS consulted and felt he is not a good surgical candidate.  Plan PCI today, 12/07/2017 of LAD and left circumflex -Postprocedural wrist hematoma, stable>>>plan is to access groin for today's procedure  -Continue ASA, atorvastatin, Plavix -Hep gtt -Continue metoprolol 12.5 mg twice daily>>will  hold this AM given HR drop to mid 40's overnight  2.  New onset atrial fibrillation: -Patient remains in NSR, heart rate 50s>>episode of bradycardia overnight. Will hold AM dose of metoprolol and re-assess postprocedural -Amiodarone 100 mg daily -Continue Hep gtt, Plavix  -CHA2DS2VASc =6 (age, HTN, TIA, vascular disease)   3.  Chronic kidney disease, stage IV: -Creatinine, 1.74 yesterday, will get stat BMET this morning -Likely contrast dye mediated -Follow closely with BMET  -Nephrology following  4.  History of BPH: -Urinary retention, Foley in place -PSA>8 -Urology consultation?   5.  Anemia: -Low>> however stable, hemoglobin> 8.3 today -FOBT positive -Remains on heparin gtt -Post-procedural right wrist hematoma>>now stable but with significant bruising  -No active s/s bleeding  6.  HTN: -Stable, 142/63, 112/51, 117/58 -Continue metoprolol 12.5 mg twice daily>>AM dose held secondary to episode of bradycardia. HR in 50's per tele review   7. HLD: -Stable, CHO-137, HDL-38, LDL-80, Trig-93 -Continue high intensity atorvastatin  8.  Hyperkalemia: -K+, 5.5 today -Monitor  closely  Signed, Kathyrn Drown NP-C [facility] Pager: [phone] 12/07/2017, 9:25 AM     For questions or updates, please contact   Please consult [email or web address] for contact info under Cardiology/STEMI.  Patient seen and examined  I agree wti hfindings as ntoed by Tonny Branch above    On exam:   Lungs are rel clear Cardiac RRR  No S3   Abd Supple Ext  R arm with ecchymoses  No LE edema  Hgb 8.3   Reviewed with D harding   Will tx 1 U PRBC  Keep on same meds  Plan for intervention today.

## 2017-12-07 NOTE — Progress Notes (Addendum)
Progress Note  Patient Name: Paul Kramer Date of Encounter: 12/07/2017  Primary Cardiologist: Dr. Glenetta Hew, MD  Subjective   Pt feeling well this AM. Mild chest discomfort overnight. One episode of bradycardia to the mid 40's overnight as well.   Inpatient Medications    Scheduled Meds: . allopurinol  100 mg Oral BH-q7a  . amiodarone  100 mg Oral Daily  . aspirin  81 mg Oral Daily  . atorvastatin  80 mg Oral q1800  . clopidogrel  75 mg Oral Daily  . darbepoetin (ARANESP) injection - NON-DIALYSIS  100 mcg Subcutaneous Q Fri-1800  . metoprolol tartrate  12.5 mg Oral BID  . pantoprazole  40 mg Oral Daily  . polyethylene glycol  17 g Oral Daily  . sodium chloride flush  3 mL Intravenous Q12H  . sodium chloride flush  3 mL Intravenous Q12H  . terazosin  4 mg Oral QPM  . vitamin B-12  1,000 mcg Oral Daily   Continuous Infusions: . sodium chloride    . sodium chloride 75 mL/hr at 12/07/17 0700  . heparin 1,100 Units/hr (12/07/17 0400)   PRN Meds: sodium chloride, acetaminophen, magnesium hydroxide, nitroGLYCERIN, ondansetron (ZOFRAN) IV, simethicone, sodium chloride flush, sodium chloride flush   Vital Signs    Vitals:   12/07/17 0350 12/07/17 0400 12/07/17 0657 12/07/17 0704  BP: (!) 112/51   (!) 142/63  Pulse: (!) 52 (!) 55 (!) 58 (!) 54  Resp: 18 20 19 18   Temp:    98.4 F (36.9 C)  TempSrc:    Oral  SpO2: 95% 97% 97% 97%  Weight:   190 lb 7.6 oz (86.4 kg)   Height:        Intake/Output Summary (Last 24 hours) at 12/07/2017 0925 Last data filed at 12/07/2017 0902 Gross per 24 hour  Intake 1566.5 ml  Output 1975 ml  Net -408.5 ml   Filed Weights   12/05/17 0638 12/06/17 0521 12/07/17 0657  Weight: 192 lb 14.4 oz (87.5 kg) 191 lb 8 oz (86.9 kg) 190 lb 7.6 oz (86.4 kg)    Physical Exam   General: Well developed, well nourished, NAD Skin: Warm, dry, intact  Head: Normocephalic, atraumatic, clear, moist mucus membranes. Neck: Negative for carotid  bruits. No JVD Lungs:Clear to ausculation bilaterally. No wheezes, rales, or rhonchi. Breathing is unlabored. Cardiovascular: RRR with S1 S2. No murmurs, rubs or gallops Abdomen: Soft, non-tender, non-distended with normoactive bowel sounds. No obvious abdominal masses. MSK: Strength and tone appear normal for age. 5/5 in all extremities Extremities: No edema. No clubbing or cyanosis. DP/PT pulses 2+ bilaterally Neuro: Alert and oriented. No focal deficits. No facial asymmetry. MAE spontaneously. Psych: Responds to questions appropriately with normal affect.    Labs    Chemistry Recent Labs  Lab 12/04/17 0807 12/05/17 0351 12/06/17 0228  NA 137 138 138  K 5.2* 5.6* 5.5*  CL 112* 113* 112*  CO2 18* 20* 19*  GLUCOSE 110* 97 107*  BUN 33* 32* 30*  CREATININE 1.82* 1.78* 1.74*  CALCIUM 7.9* 7.8* 8.3*  ALBUMIN 2.8*  --   --   GFRNONAA 32* 33* 34*  GFRAA 37* 38* 39*  ANIONGAP 7 5 7      Hematology Recent Labs  Lab 12/05/17 1638 12/06/17 0228 12/07/17 0231  WBC 7.4 8.4 7.7  RBC 2.55* 2.45* 2.47*  HGB 8.7* 8.3* 8.3*  HCT 26.1* 25.3* 25.6*  MCV 102.4* 103.3* 103.6*  MCH 34.1* 33.9 33.6  MCHC 33.3 32.8  32.4  RDW 12.6 12.4 12.6  PLT 171 155 170    Cardiac Enzymes Recent Labs  Lab 11/30/17 1126  TROPONINI 9.30*   No results for input(s): TROPIPOC in the last 168 hours.   BNP Recent Labs  Lab 12/03/17 1141  BNP 201.8*     DDimer No results for input(s): DDIMER in the last 168 hours.   Radiology    No results found.  Telemetry    12/07/17 NSR/SB HR  - Personally Reviewed  ECG    No new tracings as of  12/07/17- Personally Reviewed  Cardiac Studies   11/29/2017 Emergent Cardiac Cath:    There is mild left ventricular systolic dysfunction.  The left ventricular ejection fraction is 45-50% by visual estimate.  Prox RCA to Mid RCA lesion is 80% stenosed. Mid RCA to Dist RCA lesion is 100% stenosed with 95% stenosed side branch in Post Atrio.  Ost LM  to Mid LM lesion is 40% stenosed.  Balloon angioplasty was attempted using a BALLOON SAPPHIRE 2.0X12. -- was unable to cross the 100% stenosis & no reduction in 80% lesion.  Mid Cx to Dist Cx lesion is 95% stenosed (thrombotic) with 55% stenosed side branch in Ost 2nd Mrg. -- LIKELY THE CULPRIT LESION  Ost LAD to Prox LAD lesion is 85% stenosed.   Severe three-vessel disease with 100% CTO mid RCA, severe 95% thrombotic lesion trifurcation lesion of the distal Cx-OM 2-LPL 1 and proximal LAD 85% eccentric lesion.  Unable to cross RCA lesion confirming CTO (PDA fills via collaterals from septal perforators)  Mildly reduced LVEF of roughly 45 to 50% with inferoapical hypokinesis  Since the patient was chest pain-free on medications, and the RCA appears to be chronically occluded with complex trifurcation circumflex lesion as a likely culprit, I felt it more prudent to stop at this point to allow time for further clarification of the best course of action going forward. His CKD would also warrant staged PCI if indicated as opposed to continued contrast use.   ------------------------------------------------------------------- 11/30/2017 ECHO:  - Left ventricle: The cavity size was normal. There was mild focal basal hypertrophy of the septum. Systolic function was mildly reduced. The estimated ejection fraction was in the range of 45% to 50%. There is hypokinesis of the inferior myocardium. There was no evidence of elevated ventricular filling pressure by Doppler parameters. - Aortic valve: Valve mobility was restricted. There was moderate regurgitation. - Mitral valve: There was mild to moderate regurgitation. - Right ventricle: The cavity size was mildly dilated. Wall thickness was normal.  Patient Profile     84 y.o. male whopresented with acute onset exertional dyspnea and diaphoresis and chest discomfort. He was initially noted to be in A. fib with subtle EKG  changes, however with recurrence of active chest pain he developed1 to 2 mm inferior elevations as well as some lateral ST elevation.He was taken to Cath Lab because of these elevations and positive troponin.  Assessment & Plan    1. STEMI: -Patient presented 11/29/2017 with concern for STEMI>>taken emergently to the Cath Lab which revealed severe three-vessel disease with CTO of RCA, severe 95% thrombotic lesion trifurcation lesion of the distal Cx-OM 2-LPL 1 and proximal LAD 85% eccentric lesion.  TCTS consulted and felt he is not a good surgical candidate.  Plan PCI today, 12/07/2017 of LAD and left circumflex -Postprocedural wrist hematoma, stable>>>plan is to access groin for today's procedure  -Continue ASA, atorvastatin, Plavix -Hep gtt -Continue metoprolol 12.5 mg twice daily>>will  hold this AM given HR drop to mid 40's overnight  2.  New onset atrial fibrillation: -Patient remains in NSR, heart rate 50s>>episode of bradycardia overnight. Will hold AM dose of metoprolol and re-assess postprocedural -Amiodarone 100 mg daily -Continue Hep gtt, Plavix  -CHA2DS2VASc =6 (age, HTN, TIA, vascular disease)   3.  Chronic kidney disease, stage IV: -Creatinine, 1.74 yesterday, will get stat BMET this morning -Likely contrast dye mediated -Follow closely with BMET  -Nephrology following  4.  History of BPH: -Urinary retention, Foley in place -PSA>8 -Urology consultation?   5.  Anemia: -Low>> however stable, hemoglobin> 8.3 today -FOBT positive -Remains on heparin gtt -Post-procedural right wrist hematoma>>now stable but with significant bruising  -No active s/s bleeding  6.  HTN: -Stable, 142/63, 112/51, 117/58 -Continue metoprolol 12.5 mg twice daily>>AM dose held secondary to episode of bradycardia. HR in 50's per tele review   7. HLD: -Stable, CHO-137, HDL-38, LDL-80, Trig-93 -Continue high intensity atorvastatin  8.  Hyperkalemia: -K+, 5.5 today -Monitor  closely  Signed, Kathyrn Drown NP-C [facility] Pager: [phone] 12/07/2017, 9:25 AM     For questions or updates, please contact   Please consult [email or web address] for contact info under Cardiology/STEMI.  Patient seen and examined  I agree wti hfindings as ntoed by Tonny Branch above    On exam:   Lungs are rel clear Cardiac RRR  No S3   Abd Supple Ext  R arm with ecchymoses  No LE edema  Hgb 8.3   Reviewed with D harding   Will tx 1 U PRBC  Keep on same meds  Plan for intervention today.

## 2017-12-07 NOTE — Brief Op Note (Signed)
   BRIEF PCI NOTE  12/07/2017  5:51 PM  PATIENT:  Paul Kramer  84 y.o. male who is now just over a week post inferolateral STEMI where he was found to have a chronically occluded RCA with severe disease in the trifurcation circumflex-OM as well as proximal LAD.  He was chest pain-free upon completion of diagnostic procedure with attempted PTCA of the RCA, therefore he was treated medically with plans for staged PCI of the existing lesions if turned down by surgery.  He was seen by CVTS and was considered to be a poor CT surgical candidate.  His initial post radial cath was complicated by relatively significant right forearm hematoma and subsequent anemia.  He also had some acute on chronic renal insufficiency with creatinine up to 2.3 from baseline 1.7.  He now presents for staged PCI with stabilized creatinine and anemia getting 1 unit of blood.  PRE-OPERATIVE DIAGNOSIS:    Mid Cx to Dist Cx lesion is 95% stenosed (thrombotic) with 55% stenosed side branch in Ost 2nd Mrg. -- LIKELY THE CULPRIT LESION  Ost LAD to Prox LAD lesion is 85% stenosed.  POST-OPERATIVE DIAGNOSIS:   Successful DES PCI of mid-dist Cx across 2nd Mrg & AVG Cx -Synergy DES 3.0 mm x 16 mm (3.3 mm) with Cutting Balloon PTCA of ostial 2nd Mrg using a 2.5 mm Wolverine balloon.  Successful PCI of Ost LAD to Prox LAD lesion is 85% stenosed.-With a Synergy DES 3.0 mm x 16 mm postdilated to 3.6 mm   Moderately Elevated LVEDP  PROCEDURE:  Procedure(s): CORONARY STENT INTERVENTION (N/A)   7 Fr RFA Access - direct US Guidance (picture obtained & placed in chart)  7 Fr XBLAD 3.5 -- Prowater & Marvel wires for PCI x 2 sites  5 Fr Angled Pigtail Catheter -- for LV Hemodynamics.  SURGEON:  Surgeon(s) and Role:    * Leonie Man, MD - Primary  ANESTHESIA:   local and IV sedation ; 18 mL subcu lidocaine.  Sedated for 88 minutes, 1 mg Versed, 25 mcg fentanyl.  Monitor closely hemodynamics and oxygen saturations during the  entire procedure.  EBL:  < 50 mL   BLOOD ADMINISTERED: Completed 1 unit PRBC initiated pre-Cath.  SHEATH: 7 Fr RFA Sheath sutured in place - to be removed in 6 C Post-procedure unit with manual pressure for hemostasis.   MEDICATIONS USED:  Contrast 160 mL; Heparin - total 14,5000 Units; 20 mg IV Pepcis  DICTATION: .Note written in EPIC  PLAN OF CARE: Transfer to 6c for post PCI care   DAPT x min 1 yr  Follow Hgb & Cr x at least 2 days.  Continue to titrate CAD-Isch CM Rx.  PATIENT DISPOSITION:  PACU - hemodynamically stable.    Glenetta Hew, M.D., M.S. Interventional Cardiologist   Pager # [phone] Phone # [phone] [address] [place], [place] [address]

## 2017-12-07 NOTE — Interval H&P Note (Signed)
History and Physical Interval Note:  12/07/2017 4:13 PM  Paul Kramer  has presented today for surgery, with the diagnosis of cad- recent STEMI.    The various methods of treatment have been discussed with the patient and family. After consideration of risks, benefits and other options for treatment, the patient has consented to  Procedure(s): CORONARY STENT INTERVENTION (N/A) as a surgical intervention .  The patient's history has been reviewed, patient examined, no change in status, stable for surgery.  I have reviewed the patient's chart and labs.  Questions were answered to the patient's satisfaction.     Glenetta Hew

## 2017-12-07 NOTE — Progress Notes (Signed)
Offered to walk pt this am. Family states that he ambulated this am with no c/o dizziness or shortness of breath. Pt for staged PCI at 1030. Will f/u post procedure.  Rufina Falco, RN BSN 12/07/2017 9:36 AM

## 2017-12-07 NOTE — Progress Notes (Signed)
Admit: 11/29/2017 LOS: 2  74M AoCKD4 related to CIN at time of LHC for STEMI, needs rpt LHC for PCI  Subjective:  For LHC  Today Good UOP  SCr stable, K 5.4 was on patiromre   06/09 0701 - 06/10 0700 In: 1806.5 [P.O.:960; I.V.:846.5] Out: 2275 [Urine:2275]  Filed Weights   12/05/17 0638 12/06/17 0521 12/07/17 0657  Weight: 87.5 kg (192 lb 14.4 oz) 86.9 kg (191 lb 8 oz) 86.4 kg (190 lb 7.6 oz)    Scheduled Meds: . allopurinol  100 mg Oral BH-q7a  . amiodarone  100 mg Oral Daily  . aspirin  81 mg Oral Daily  . atorvastatin  80 mg Oral q1800  . clopidogrel  75 mg Oral Daily  . darbepoetin (ARANESP) injection - NON-DIALYSIS  100 mcg Subcutaneous Q Fri-1800  . diphenhydrAMINE  25 mg Intravenous Once  . methylPREDNISolone sodium succinate  125 mg Intravenous Once  . metoprolol tartrate  12.5 mg Oral BID  . pantoprazole  40 mg Oral Daily  . polyethylene glycol  17 g Oral Daily  . sodium chloride flush  3 mL Intravenous Q12H  . sodium chloride flush  3 mL Intravenous Q12H  . terazosin  4 mg Oral QPM  . vitamin B-12  1,000 mcg Oral Daily   Continuous Infusions: . sodium chloride    . sodium chloride 75 mL/hr at 12/07/17 1013  . sodium chloride    . heparin 1,200 Units/hr (12/07/17 0959)   PRN Meds:.sodium chloride, acetaminophen, magnesium hydroxide, nitroGLYCERIN, ondansetron (ZOFRAN) IV, simethicone, sodium chloride flush, sodium chloride flush  Current Labs: reviewed    Physical Exam:  Blood pressure (!) 142/63, pulse (!) 54, temperature 98.4 F (36.9 C), temperature source Oral, resp. rate 18, height 5\' 5"  (1.651 m), weight 86.4 kg (190 lb 7.6 oz), SpO2 97 %. NAD RRR CTAB No LEE  A 1. AoCKD4, returned to baseline renal functin 2. CAD, admit wit hSTEMI, not CABG candidate for MV disease, for LHC and PCI today 3. Hyperkalemia, mild, rec patiromer 4. Anemia  P 1. LHC today 2. Check Mg while on patiromer 3. Redose tiwht 16.8gm today of patiromer   Pearson Grippe  MD 12/07/2017, 11:39 AM  Recent Labs  Lab 12/04/17 0807 12/05/17 0351 12/06/17 0228 12/07/17 0231  NA 137 138 138 137  K 5.2* 5.6* 5.5* 5.4*  CL 112* 113* 112* 111  CO2 18* 20* 19* 21*  GLUCOSE 110* 97 107* 104*  BUN 33* 32* 30* 29*  CREATININE 1.82* 1.78* 1.74* 1.81*  CALCIUM 7.9* 7.8* 8.3* 8.4*  PHOS 3.7  --   --   --    Recent Labs  Lab 12/05/17 1638 12/06/17 0228 12/07/17 0231  WBC 7.4 8.4 7.7  NEUTROABS 4.9  --   --   HGB 8.7* 8.3* 8.3*  HCT 26.1* 25.3* 25.6*  MCV 102.4* 103.3* 103.6*  PLT 171 155 170

## 2017-12-07 NOTE — Progress Notes (Signed)
[place] for Heparin Indication: atrial fibrillation and ACS  Allergies  Allergen Reactions  . Other Shortness Of Breath    Esters  . Contrast Media [Iodinated Diagnostic Agents] Other (See Comments)    Just feel bad all over  . Morphine And Related Other (See Comments)    Patient states he does not like to take Morphine as it gives him nightmares and hallucinations.     Patient Measurements: Height: 5\' 5"  (165.1 cm) Weight: 190 lb 7.6 oz (86.4 kg) IBW/kg (Calculated) : 61.5 Heparin Dosing Weight: 78kg  Vital Signs: Temp: 98.4 F (36.9 C) (06/10 0704) Temp Source: Oral (06/10 0704) BP: 142/63 (06/10 0704) Pulse Rate: 54 (06/10 0704)  Labs: Recent Labs    12/05/17 0351 12/05/17 1638 12/06/17 0228 12/07/17 0231  HGB 8.0* 8.7* 8.3* 8.3*  HCT 24.2* 26.1* 25.3* 25.6*  PLT 157 171 155 170  LABPROT  --   --   --  14.3  INR  --   --   --  1.11  HEPARINUNFRC 0.28*  --  0.31 0.29*  CREATININE 1.78*  --  1.74*  --     Estimated Creatinine Clearance: 31.4 mL/min (A) (by C-G formula based on SCr of 1.74 mg/dL (H)).   Assessment: 32 YOM who presents with SOB and some chest tightness. New AFib on EKG, troponins are also elevated. Patient is not on anticoagulation PTA. He has large hematoma on right arm post cath, now resolving.   Heparin level slightly below goal this AM at 0.29 units/mL. No issues noted overnight.   Plan for staged PCI when AKI resolves- nephrology noted yesterday his renal function is back to baseline.  Goal of Therapy:  Heparin level 0.3-0.7 units/ml Monitor platelets by anticoagulation protocol: Yes   Plan:   Increase heparin to 1200 units/hr. Continue daily heparin level and CBC.  Aidyn Sportsman D. Jorgeluis Gurganus, PharmD, BCPS Clinical Pharmacist [phone] Please check AMION for all [place] numbers 12/07/2017 8:43 AM

## 2017-12-08 ENCOUNTER — Telehealth: Payer: Self-pay

## 2017-12-08 ENCOUNTER — Encounter (HOSPITAL_COMMUNITY): Payer: Self-pay | Admitting: Cardiology

## 2017-12-08 LAB — BASIC METABOLIC PANEL
ANION GAP: 7 (ref 5–15)
BUN: 27 mg/dL — ABNORMAL HIGH (ref 6–20)
CALCIUM: 8.4 mg/dL — AB (ref 8.9–10.3)
CO2: 19 mmol/L — ABNORMAL LOW (ref 22–32)
Chloride: 110 mmol/L (ref 101–111)
Creatinine, Ser: 1.68 mg/dL — ABNORMAL HIGH (ref 0.61–1.24)
GFR, EST AFRICAN AMERICAN: 41 mL/min — AB (ref >60)
GFR, EST NON AFRICAN AMERICAN: 35 mL/min — AB (ref >60)
Glucose, Bld: 159 mg/dL — ABNORMAL HIGH (ref 65–99)
POTASSIUM: 4.6 mmol/L (ref 3.5–5.1)
Sodium: 136 mmol/L (ref 135–145)

## 2017-12-08 LAB — TYPE AND SCREEN
ABO/RH(D): O POS
Antibody Screen: NEGATIVE
Unit division: 0

## 2017-12-08 LAB — BPAM RBC
Blood Product Expiration Date: 201907062359
ISSUE DATE / TIME: 201906101351
Unit Type and Rh: 5100

## 2017-12-08 LAB — CBC
HEMATOCRIT: 30.5 % — AB (ref 39.0–52.0)
Hemoglobin: 10.2 g/dL — ABNORMAL LOW (ref 13.0–17.0)
MCH: 32.4 pg (ref 26.0–34.0)
MCHC: 33.4 g/dL (ref 30.0–36.0)
MCV: 96.8 fL (ref 78.0–100.0)
PLATELETS: 171 10*3/uL (ref 150–400)
RBC: 3.15 MIL/uL — ABNORMAL LOW (ref 4.22–5.81)
RDW: 16.6 % — AB (ref 11.5–15.5)
WBC: 9.3 10*3/uL (ref 4.0–10.5)

## 2017-12-08 LAB — MAGNESIUM: MAGNESIUM: 1.6 mg/dL — AB (ref 1.7–2.4)

## 2017-12-08 MED ORDER — ANGIOPLASTY BOOK
Freq: Once | Status: AC
  Administered 2017-12-08: 1
  Filled 2017-12-08: qty 1

## 2017-12-08 MED FILL — Heparin Sod (Porcine)-NaCl IV Soln 1000 Unit/500ML-0.9%: INTRAVENOUS | Qty: 1000 | Status: AC

## 2017-12-08 NOTE — Progress Notes (Signed)
CARDIAC REHAB PHASE I   PRE:  Rate/Rhythm: 54 SB    BP: sitting 127/62    SaO2:   MODE:  Ambulation: 500 ft   POST:  Rate/Rhythm: 77 SR    BP: sitting 151/53     SaO2: 99 RA  Pt able to stand independently and walk with RW and gait belt. Steady, quick pace at times. Discussed appropriate use of RW for safety. Pt felt good and able to walk longer distance today. Ed completed, daughter present. Encouraged x2 more walks today. [place], ACSM 12/08/2017 11:27 AM

## 2017-12-08 NOTE — Telephone Encounter (Signed)
Still in hospital

## 2017-12-08 NOTE — Telephone Encounter (Signed)
-----   Message from Rivka Barbara sent at 12/08/2017 12:46 PM EDT ----- Regarding: [facility] appointment  TOC appointment scheduled with Almyra Deforest 6/20 10:00am

## 2017-12-08 NOTE — Progress Notes (Addendum)
Site area: right groin  Site Prior to Removal:  Level 0  Pressure Applied For 20 MINUTES    Minutes Beginning at 2125  Manual:   Yes.    Patient Status During Pull:  stable  Post Pull Groin Site:  Level 0  Post Pull Instructions Given:  Yes.    Post Pull Pulses Present:  Yes.    Dressing Applied:  Yes.    Comments:  Site remains level 0 when checked remainder of shift   Groin site oozing when received from cath lab at 1825 and continued to ooze until about 1925. No oozing noted after that. Sheath remained in until ACT within range to safely removed. Reviewed care as it was given with patient and son in law. Son in law at bedside with patient until after sheath pulled and patient ate. Site remains unchanged during remainder of shift.

## 2017-12-08 NOTE — Discharge Summary (Addendum)
Discharge Summary    Patient ID: Paul Kramer,  MRN: [id number], DOB/AGE: [id number] y.o.  Admit date: 11/29/2017 Discharge date: 12/11/2017  Primary Care Provider: Lajean Manes Primary Cardiologist: Glenetta Hew, MD  Discharge Diagnoses    Principal Problem:   Acute ST elevation myocardial infarction (STEMI) [facility]) Active Problems:   Essential hypertension   Hyperlipidemia with target low density lipoprotein (LDL) cholesterol less than 70 mg/dL   New onset a-fib ([place]) - with RVR   Status post coronary artery stent placement   Macrocytosis   AKI (acute kidney injury) on CKD stage III-IV   Elevated PSA   Pressure injury of skin   Leukocytosis    Allergies Allergies  Allergen Reactions  . Other Shortness Of Breath    Esters  . Contrast Media [Iodinated Diagnostic Agents] Other (See Comments)    Just feel bad all over  . Morphine And Related Other (See Comments)    Patient states he does not like to take Morphine as it gives him nightmares and hallucinations.    Diagnostic Studies/Procedures    11/29/2017 Emergent Cardiac Cath:    There is mild left ventricular systolic dysfunction.  The left ventricular ejection fraction is 45-50% by visual estimate.  Prox RCA to Mid RCA lesion is 80% stenosed. Mid RCA to Dist RCA lesion is 100% stenosed with 95% stenosed side branch in Post Atrio.  Ost LM to Mid LM lesion is 40% stenosed.  Balloon angioplasty was attempted using a BALLOON SAPPHIRE 2.0X12. -- was unable to cross the 100% stenosis & no reduction in 80% lesion.  Mid Cx to Dist Cx lesion is 95% stenosed (thrombotic) with 55% stenosed side branch in Ost 2nd Mrg. -- LIKELY THE CULPRIT LESION  Ost LAD to Prox LAD lesion is 85% stenosed.   Severe three-vessel disease with 100% CTO mid RCA, severe 95% thrombotic lesion trifurcation lesion of the distal Cx-OM 2-LPL 1 and proximal LAD 85% eccentric lesion.  Unable to cross RCA lesion confirming CTO (PDA fills  via collaterals from septal perforators)  Mildly reduced LVEF of roughly 45 to 50% with inferoapical hypokinesis  Since the patient was chest pain-free on medications, and the RCA appears to be chronically occluded with complex trifurcation circumflex lesion as a likely culprit, I felt it more prudent to stop at this point to allow time for further clarification of the best course of action going forward. His CKD would also warrant staged PCI if indicated as opposed to continued contrast use.   ------------------------------------------------------------------- 11/30/2017 ECHO:  - Left ventricle: The cavity size was normal. There was mild focal basal hypertrophy of the septum. Systolic function was mildly reduced. The estimated ejection fraction was in the range of 45% to 50%. There is hypokinesis of the inferior myocardium. There was no evidence of elevated ventricular filling pressure by Doppler parameters. - Aortic valve: Valve mobility was restricted. There was moderate regurgitation. - Mitral valve: There was mild to moderate regurgitation. - Right ventricle: The cavity size was mildly dilated. Wall thickness was normal.   Cardiac catheterization 12/07/17:   LESION #1 & 1.5: Mid Cx to Dist Cx lesion is 95% stenosed with 55% stenosed side branch in Ost 2nd Mrg.  Scoring balloon angioplasty was performed on the side branch using a BALLOON WOLVERINE 2.50X10.  Post intervention, the side branch was reduced to 20% residual stenosis.  A drug-eluting stent was successfully placed in the main Circumflex using a STENT SYNERGY DES 3X16. -Postdilated to 3.3 mm  Post intervention, there is a 0% residual stenosis.  LESION #2: Prox LAD lesion is 85% stenosed.  A drug-eluting stent was successfully placed using a STENT SYNERGY DES 3X16. - Post-dilated to 3.6 mm  Post intervention, there is a 0% residual stenosis.  _______________________________________________  LV  end diastolic pressure is moderately elevated.    Successful DES PCI of mid-dist Cxacross2nd Mrg& AVG Cx -Synergy DES 3.0 mmx16 mm (3.3 mm) with Cutting Balloon PTCA of ostial2nd Mrgusing a 2.5 mm Wolverine balloon.  Successful PCI ofOst LAD to Prox LAD lesion is 85% stenosed.-With a Synergy DES 3.0 mmx16 mm postdilated to 3.58mm   Moderately Elevated LVEDP   Transfer to 6 Central post procedure unit for sheath removal with manual pressure. Monitor renal function and hemoglobin/hematocrit Dual antiplatelet therapy for minimum 1 year  Anticipate discharge in about 2 days if renal function stable.  History of Present Illness     Mr. Yakubov is an 84 year old gentleman with HTN, HL, TIA who presented with chest pressure and DOE on 11/29/17 and found to have subtle inferolateral ST elevation.  The patient and son-in-law provided history to admitting provider. Family reports that he is generally very functional and active. On the day of presentation, he was walking and son-in-law noted that he had to stop frequently to catch his breath, which is very abnormal for him. He also noted some intermittent chest pressure which radiated to his neck. Given symptoms, son-in-law brought him to the ED for further evaluation.  In the ED, he was found to be in new onset atrial fibrillation with RVR and HRs 110s-140s. Blood pressure low in the 67T systolic. Initial ECG with baseline wander and subtle ST changes in inferior leads which did not meet criteria for STEMI. While in the ED, however, patient had recurrent chest pain and ECG during chest pain showed dynamic inferolateral ST elevation for which cardiology was consulted and emergent cath lab activated. CP improved shortly thereafter and ECG showed improvement, but not resolution of ST changes and thus after discussion with the patient and his son-in-law, decision was made to proceed with LHC.  Hospital Course     Consultant:  Nephrologist  In the cath lab on 11/29/17, the LV gram showed inferior/apical hypokinesis. Coronary angiogram revealed 100% chronically occluded mid RCA. LCx with 95-99% trifurcation lesion at distal vessel, thought to be likely culprit given chronicity of RCA lesion. LAD with 80% proximal lesion and LM 40%. Consulted CT surgery. Along with further discussion with family and film review per MD, given pt's age and co-morbidities, he should not be taken for surgery. Therefore, a planned two-vessel PCI was scheduled for 12/07/17 given his increased renal function.    He was loaded with 300mg  Plavix and taken back to the cath lab on 12/07/17. A successful DES PCI of mid-dist Cxwith Cutting Balloon PTCA of ostial2nd Mrgwas completed. An additional successful PCI ofOst LAD to Prox LAD lesion was completed without complication.    Plan is for follow up labs, specifically renal function and CBC and DAPT (ASA and plavix) for at least 1 year.   He will have CBC and BMP weekly for at least the next three weeks.   Problems and plans described below:  1. STEMI: -Patient presented 11/29/2017 with concern for STEMI>>taken emergently to the Cath Lab on 11/29/17 which revealed severe three-vessel disease with CTO of RCA, severe 95% thrombotic lesion trifurcation lesion of the distal Cx-OM 2-LPL 1 and proximal LAD 85% eccentric lesion. TCTS consulted,  as well as review of films with MD and discussion with family who all felt he is not a good overall surgical candidate. Plan was made for staged PCI on 12/07/17 of LAD and left circumflex -Continue ASA, atorvastatin, Plavix. BB discontinued at discharge due to resting bradycardia at 50s on metoprolol 12.5mg  BID and Troprol XL 25mg  Qd.  -Pt walked with cardiac rehab prior to discharge without complication  -Will need to continue DAPT for at least one year   2.  New onset atrial fibrillation: -Patient remains in NSR, heart rate 50s -Continue Amiodarone 100 mg daily   -Continue Plavix and ASA. Discussed plan with MD who would like to hold off on anticoagulation secondary to hematoma and anemia during this admission . Will need to address at follow up visit after re-assessment of HB. -CHA2DS2VASc =6 (age, HTN, TIA, vascular disease)  - His heart rate fluctuated in the 50s to low 60s on 12.5 mg lopressor BID initially then on Troprol XL 25mg  qd.  Dr. Harrington Challenger recommended to hold adding any BB at discharge.  Please assess at OP follow up.   3.  Chronic kidney disease, stage IV: -Admission creatinine 1.99 with peak at 2.08 on 12/03/17. Pt has been gently hydrated during his stay with avoidance of nephrotoxic medications. There are no baseline levels for comparison.  Nephrology was consulted and started on Aranesp during his stay secondary to anemia. ACEI held for renal function - readdress per nephrology. Has appointment with Dr. Posey Pronto on 7/17 at [facility].   4.  History of BPH: -Hx of urinary retention. Voided without complication  -PSA>8 -He may eventually need OP urology consultation. Will need to follow closely with PCP for now -Continue terazosin   5.  Anemia of chronic disease: -He has had some issue with anemia since his admission. Given that he had two separate procedures and will need DAPT therapy, he was given one unit of PRBC's prior to his second cath on 12/07/17 (Hb 10.5 on admission >>> then relatively stable between 9-10. From first cath on 11/29/17, the pt had difficulty with large, controlled hematoma in the right radial cath site. Likely the source of his drop in Hb. Second cath on 12/07/17 with groin access with no complication.  6.  HTN: -Stable on BB as noted above. However held BB at discharge due to resting bradycardia. Follow closely at outpatient. He will need some short of antihypertensive during follow up.   7. HLD: -11/30/2017: Cholesterol 137; HDL 38; LDL Cholesterol 80; Triglycerides 93; VLDL 19 -Continue high-intensity  atorvastatin -Will need LFT's and lipid panel in approximately 6 weeks   8. Leukocytotic and pyuria / UTI - WBC trending up to 19. Urine culture grown E.coli. Afebrile. Treated with ceftriaxone while admitted. Pending sensitivity>> will be followed by Dr. Harrington Challenger over weekend. She will call appropriate renal dose antibiotics.  The patient will on call provider if did not heart in 24 hours.   9. Hypomagnesemia - Will be discharge on supplement  The pt has been seen and examined by Dr. Harrington Challenger who feels that he is stable and ready for discharge today, 12/11/17.    Discharge Vitals Blood pressure (!) 154/64, pulse 61, temperature 98.1 F (36.7 C), temperature source Oral, resp. rate 20, height 5\' 5"  (1.651 m), weight 198 lb 3.1 oz (89.9 kg), SpO2 96 %.  Filed Weights   12/09/17 0540 12/10/17 0706 12/11/17 0305  Weight: 190 lb 4.1 oz (86.3 kg) 190 lb 7.6 oz (86.4 kg) 198 lb 3.1  oz (89.9 kg)   Labs & Radiologic Studies    CBC Recent Labs    12/11/17 0303 12/11/17 1017  WBC 19.3* 16.7*  HGB 9.3* 9.7*  HCT 29.3* 29.4*  MCV 101.0* 100.7*  PLT 158 151   Basic Metabolic Panel Recent Labs    12/09/17 0822 12/10/17 0256 12/11/17 0303  NA 133* 136 134*  K 4.5 4.6 4.5  CL 106 109 108  CO2 17* 20* 20*  GLUCOSE 188* 135* 142*  BUN 42* 37* 34*  CREATININE 2.16* 2.15* 2.08*  CALCIUM 8.3* 8.1* 7.8*  MG 1.7  --  1.7   Dg Chest 2 View  Result Date: 11/29/2017 CLINICAL DATA:  Worsening shortness of breath with chest tightness. EXAM: CHEST - 2 VIEW COMPARISON:  None. FINDINGS: The cardiac silhouette is normal in size. Aortic atherosclerosis is noted. There are small calcified mediastinal and hilar lymph nodes suggesting prior granulomatous infection. The lungs are normally to mildly hyperinflated without evidence of airspace consolidation, edema, pleural effusion, pneumothorax. No acute osseous abnormality is seen. IMPRESSION: No active cardiopulmonary disease. Electronically Signed   By: Logan Bores M.D.   On: 11/29/2017 20:12   US Renal  Result Date: 12/03/2017 CLINICAL DATA:  Acute kidney injury. EXAM: RENAL / URINARY TRACT ULTRASOUND COMPLETE COMPARISON:  Ultrasound of March 30, 2016. FINDINGS: Right Kidney: Length: 10.8 cm. Echogenicity within normal limits. No mass or hydronephrosis visualized. Left Kidney: Length: 11.1 cm. 3.8 cm cyst is noted in the renal pelvis. Echogenicity within normal limits. No mass or hydronephrosis visualized. Bladder: Decompressed secondary to Foley catheter. IMPRESSION: 3.8 cm left renal cyst. No other significant renal abnormality seen. Electronically Signed   By: Marijo Conception, M.D.   On: 12/03/2017 16:17   Dg Chest Port 1 View  Result Date: 12/03/2017 CLINICAL DATA:  Productive cough, CHF EXAM: PORTABLE CHEST 1 VIEW COMPARISON:  11/29/2017 FINDINGS: Calcified lymph nodes in the left hilum and mediastinum. Heart is normal size. Increasing linear densities in the lung bases, likely atelectasis. No effusions or acute bony abnormality. IMPRESSION: Bibasilar atelectasis. Electronically Signed   By: Rolm Baptise M.D.   On: 12/03/2017 11:34   Disposition   Pt is being discharged home today in good condition.  Follow-up Plans & Appointments    Follow-up Information    Almyra Deforest, [place] Follow up on 12/17/2017.   Specialties:  Cardiology, Radiology Why:  Your follow up appointment will be on 12/17/17 at 10am. Please arrive by 0945am.  Contact information: [address] [place] [address] [place] Follow up on 12/11/2017.   Specialty:  Cardiology Why:  lab for CBC and BMP anytime after 7:30am Contact information: [place] [phone]       [place], [place] Follow up.   Why:  Follow up with Dr. Posey Pronto. We will call with appointment time Contact information: [place] [address] [phone]          Discharge Instructions     AMB Referral to Cardiac Rehabilitation - Phase II   Complete by:  As directed    Diagnosis:   Coronary Stents STEMI     Amb Referral to Cardiac Rehabilitation   Complete by:  As directed    Diagnosis:   Coronary Stents PTCA STEMI     Diet - low sodium heart healthy   Complete by:  As directed    Diet -  low sodium heart healthy   Complete by:  As directed    Discharge instructions   Complete by:  As directed    No driving for 1 week. No lifting over 5 lbs for 1 week. No sexual activity for 1 week. You may return to work in 1 week. Keep procedure site clean & dry. If you notice increased pain, swelling, bleeding or pus, call/return!  You may shower, but no soaking baths/hot tubs/pools for 1 week.   Discharge instructions   Complete by:  As directed    No driving until cleared by your cardiologist.  No lifting over 5 lbs for 1 week. No sexual activity for 1 week. Y Keep procedure site clean & dry. If you notice increased pain, swelling, bleeding or pus, call/return!  You may shower, but no soaking baths/hot tubs/pools for 1 week.   Dr. Harrington Challenger will call with sensitivity result.   Increase activity slowly   Complete by:  As directed    Increase activity slowly   Complete by:  As directed      Discharge Medications   Allergies as of 12/11/2017      Reactions   Other Shortness Of Breath   Esters   Contrast Media [iodinated Diagnostic Agents] Other (See Comments)   Just feel bad all over   Morphine And Related Other (See Comments)   Patient states he does not like to take Morphine as it gives him nightmares and hallucinations.       Medication List    STOP taking these medications   amoxicillin 500 MG capsule Commonly known as:  AMOXIL   dexamethasone 4 MG tablet Commonly known as:  DECADRON   lisinopril 10 MG tablet Commonly known as:  PRINIVIL,ZESTRIL   simvastatin 10 MG tablet Commonly known as:  ZOCOR Replaced by:  atorvastatin 80 MG tablet     TAKE these  medications   allopurinol 100 MG tablet Commonly known as:  ZYLOPRIM Take 100 mg by mouth every morning. Notes to patient:  Prevents gout    amiodarone 100 MG tablet Commonly known as:  PACERONE Take 1 tablet (100 mg total) by mouth daily. Notes to patient:  Controls heart rhythm   aspirin EC 81 MG tablet Take 81 mg by mouth daily. Notes to patient:  Blood thinner  Prevents clotting in stent and heart attack   atorvastatin 80 MG tablet Commonly known as:  LIPITOR Take 1 tablet (80 mg total) by mouth daily at 6 PM. Replaces:  simvastatin 10 MG tablet Notes to patient:  Lowers cholesterol    CENTRUM SILVER PO Take 1 tablet by mouth daily. Notes to patient:  Supplement    clopidogrel 75 MG tablet Commonly known as:  PLAVIX Take 1 tablet (75 mg total) by mouth daily. Notes to patient:  Blood thinner  Prevents clotting in stent and heart attack   cyanocobalamin 1000 MCG tablet Take 1 tablet (1,000 mcg total) by mouth daily. Notes to patient:  Supplement    magnesium oxide 400 (241.3 Mg) MG tablet Commonly known as:  MAG-OX Take 1 tablet (400 mg total) by mouth daily. Notes to patient:  Supplement    nitroGLYCERIN 0.4 MG SL tablet Commonly known as:  NITROSTAT Place 1 tablet (0.4 mg total) under the tongue every 5 (five) minutes x 3 doses as needed for chest pain.   terazosin 2 MG capsule Commonly known as:  HYTRIN Take 4 mg by mouth every evening.        Aspirin  prescribed at discharge?  Yes High Intensity Statin Prescribed? (Lipitor 40-80mg  or Crestor 20-40mg ): Yes Beta Blocker Prescribed? No due to bradycardia  For EF <40%, was ACEI/ARB Prescribed? No due  To AKI ADP Receptor Inhibitor Prescribed? (i.e. Plavix etc.-Includes Medically Managed Patients): Yes For EF <40%, Aldosterone Inhibitor Prescribed? Yes Was EF assessed during THIS hospitalization? Yes Was Cardiac Rehab II ordered? (Included Medically managed Patients): Yes   Outstanding Labs/Studies    Per Dr. Harrington Challenger, patient needs weekly BMP and CBCs for at least 3-4 weeks given his anemia.  LFTs and Lipids in 6 weeks  Please order future labs at follow up visit.   Duration of Discharge Encounter   Greater than 30 minutes including physician time.  Jarrett Soho - PAC 12/11/2017, 2:29 PM

## 2017-12-08 NOTE — Progress Notes (Signed)
Progress Note  Patient Name: Paul Kramer Date of Encounter: 12/08/2017  Primary Cardiologist: Dr. Glenetta Hew, MD  Subjective   Pt denies CP   Breathing is OK    Inpatient Medications    Scheduled Meds: . allopurinol  100 mg Oral BH-q7a  . amiodarone  100 mg Oral Daily  . aspirin  81 mg Oral Daily  . atorvastatin  80 mg Oral q1800  . clopidogrel  75 mg Oral Daily  . darbepoetin (ARANESP) injection - NON-DIALYSIS  100 mcg Subcutaneous Q Fri-1800  . metoprolol tartrate  12.5 mg Oral BID  . pantoprazole  40 mg Oral Daily  . polyethylene glycol  17 g Oral Daily  . sodium chloride flush  3 mL Intravenous Q12H  . sodium chloride flush  3 mL Intravenous Q12H  . terazosin  4 mg Oral QPM  . vitamin B-12  1,000 mcg Oral Daily   Continuous Infusions: . sodium chloride    . sodium chloride     PRN Meds: sodium chloride, acetaminophen, magnesium hydroxide, nitroGLYCERIN, ondansetron (ZOFRAN) IV, simethicone, sodium chloride flush, sodium chloride flush   Vital Signs    Vitals:   12/08/17 0000 12/08/17 0025 12/08/17 0355 12/08/17 0756  BP: (!) 123/46 (!) 133/50 (!) 125/52 (!) 110/54  Pulse: 63 66 (!) 51 (!) 57  Resp: 15 14 17 20   Temp:   98.3 F (36.8 C) 98.2 F (36.8 C)  TempSrc:   Oral Oral  SpO2: 94% 95% 97% 96%  Weight:   85.8 kg (189 lb 2.5 oz)   Height:        Intake/Output Summary (Last 24 hours) at 12/08/2017 0858 Last data filed at 12/08/2017 0700 Gross per 24 hour  Intake 1765.5 ml  Output 1875 ml  Net -109.5 ml   Filed Weights   12/06/17 0521 12/07/17 0657 12/08/17 0355  Weight: 86.9 kg (191 lb 8 oz) 86.4 kg (190 lb 7.6 oz) 85.8 kg (189 lb 2.5 oz)    Physical Exam   General: NAD   Skin: Warm, dry, intact  Head: Normocephalic, atraumatic, clear, moist mucus membranes. Neck:  JVP is normal   Lungs:Clear to ausculation bilaterally. No wheezes, rales, or rhonchi. Breathing is unlabored. Cardiovascular: RRR with S1 S2. No murmurs, rubs or  gallops Abdomen: Soft, non-tender, non-distended with normoactive bowel sounds. No obvious abdominal masses. MSK: Moving extremities   Extremities:Triv edema.     R arm soft with ecchymoses Neuro: Alert and oriented. No focal deficits. No facial asymmetry. MAE spontaneously. Psych: Responds to questions appropriately with normal affect.    Labs    Chemistry Recent Labs  Lab 12/04/17 0807  12/06/17 0228 12/07/17 0231 12/08/17 0236  NA 137   < > 138 137 136  K 5.2*   < > 5.5* 5.4* 4.6  CL 112*   < > 112* 111 110  CO2 18*   < > 19* 21* 19*  GLUCOSE 110*   < > 107* 104* 159*  BUN 33*   < > 30* 29* 27*  CREATININE 1.82*   < > 1.74* 1.81* 1.68*  CALCIUM 7.9*   < > 8.3* 8.4* 8.4*  ALBUMIN 2.8*  --   --   --   --   GFRNONAA 32*   < > 34* 32* 35*  GFRAA 37*   < > 39* 38* 41*  ANIONGAP 7   < > 7 5 7    < > = values in this interval not displayed.  Hematology Recent Labs  Lab 12/06/17 0228 12/07/17 0231 12/08/17 0236  WBC 8.4 7.7 9.3  RBC 2.45* 2.47* 3.15*  HGB 8.3* 8.3* 10.2*  HCT 25.3* 25.6* 30.5*  MCV 103.3* 103.6* 96.8  MCH 33.9 33.6 32.4  MCHC 32.8 32.4 33.4  RDW 12.4 12.6 16.6*  PLT 155 170 171    Cardiac Enzymes No results for input(s): TROPONINI in the last 168 hours. No results for input(s): TROPIPOC in the last 168 hours.   BNP Recent Labs  Lab 12/03/17 1141  BNP 201.8*     DDimer No results for input(s): DDIMER in the last 168 hours.   Radiology    No results found.  Telemetry    12/07/17 NSR/SB HR  - Personally Reviewed  ECG    No new tracings as of  12/07/17- Personally Reviewed  Cardiac Studies   11/29/2017 Emergent Cardiac Cath:    There is mild left ventricular systolic dysfunction.  The left ventricular ejection fraction is 45-50% by visual estimate.  Prox RCA to Mid RCA lesion is 80% stenosed. Mid RCA to Dist RCA lesion is 100% stenosed with 95% stenosed side branch in Post Atrio.  Ost LM to Mid LM lesion is 40%  stenosed.  Balloon angioplasty was attempted using a BALLOON SAPPHIRE 2.0X12. -- was unable to cross the 100% stenosis & no reduction in 80% lesion.  Mid Cx to Dist Cx lesion is 95% stenosed (thrombotic) with 55% stenosed side branch in Ost 2nd Mrg. -- LIKELY THE CULPRIT LESION  Ost LAD to Prox LAD lesion is 85% stenosed.   Severe three-vessel disease with 100% CTO mid RCA, severe 95% thrombotic lesion trifurcation lesion of the distal Cx-OM 2-LPL 1 and proximal LAD 85% eccentric lesion.  Unable to cross RCA lesion confirming CTO (PDA fills via collaterals from septal perforators)  Mildly reduced LVEF of roughly 45 to 50% with inferoapical hypokinesis  Since the patient was chest pain-free on medications, and the RCA appears to be chronically occluded with complex trifurcation circumflex lesion as a likely culprit, I felt it more prudent to stop at this point to allow time for further clarification of the best course of action going forward. His CKD would also warrant staged PCI if indicated as opposed to continued contrast use.   ------------------------------------------------------------------- 11/30/2017 ECHO:  - Left ventricle: The cavity size was normal. There was mild focal basal hypertrophy of the septum. Systolic function was mildly reduced. The estimated ejection fraction was in the range of 45% to 50%. There is hypokinesis of the inferior myocardium. There was no evidence of elevated ventricular filling pressure by Doppler parameters. - Aortic valve: Valve mobility was restricted. There was moderate regurgitation. - Mitral valve: There was mild to moderate regurgitation. - Right ventricle: The cavity size was mildly dilated. Wall thickness was normal   Cath/PCI 12/07/17   LESION #1 & 1.5: Mid Cx to Dist Cx lesion is 95% stenosed with 55% stenosed side branch in Ost 2nd Mrg.  Scoring balloon angioplasty was performed on the side branch using a BALLOON  WOLVERINE 2.50X10.  Post intervention, the side branch was reduced to 20% residual stenosis.  A drug-eluting stent was successfully placed in the main Circumflex using a STENT SYNERGY DES 3X16. -Postdilated to 3.3 mm  Post intervention, there is a 0% residual stenosis.  LESION #2: Prox LAD lesion is 85% stenosed.  A drug-eluting stent was successfully placed using a STENT SYNERGY DES 3X16. - Post-dilated to 3.6 mm  Post intervention, there is  a 0% residual stenosis.  _______________________________________________  LV end diastolic pressure is moderately elevated.    Successful DES PCI of mid-dist Cxacross2nd Mrg& AVG Cx -Synergy DES 3.0 mmx16 mm (3.3 mm) with Cutting Balloon PTCA of ostial2nd Mrgusing a 2.5 mm Wolverine balloon.  Successful PCI ofOst LAD to Prox LAD lesion is 85% stenosed.-With a Synergy DES 3.0 mmx16 mm postdilated to 3.31mm   Moderately Elevated LVEDP   Transfer to 6 Central post procedure unit for sheath removal with manual pressure. Monitor renal function and hemoglobin/hematocrit Dual antiplatelet therapy for minimum 1 year        Patient Profile     84 y.o. male whopresented with acute onset exertional dyspnea and diaphoresis and chest discomfort. He was initially noted to be in A. fib with subtle EKG changes, however with recurrence of active chest pain he developed1 to 2 mm inferior elevations as well as some lateral ST elevation.He was taken to Cath Lab because of these elevations and positive troponin.  Assessment & Plan    1. STEMI: -Patient presented 11/29/2017 with concern for STEMI>>taken emergently to the Cath Lab which revealed severe three-vessel disease with CTO of RCA, severe 95% thrombotic lesion trifurcation lesion of the distal Cx-OM 2-LPL 1 and proximal LAD 85% eccentric lesion. Pt underwent intervention yesterday   DOing well  Keep on antiplt htherapy    2.  New onset atrial fibrillation:  Pt had afib on admit  New  for him    He was treated with IV amiodarone and converted   He is now on low dose amiodarone 100 mg    HR 50s to 60s    EKG   SB 50 with first degree AV block    CHADSVASc 6    Has been on heparin prior to intervention     Now off of ASA and Plavix    With severe anemia would follow for now   He is high risk on triple Rx   For bleeding    3.  Chronic kidney disease, stage IV: Cr 1.68 today       4.  History of BPH: -Foley out today    Has appt with Urology in July.   5.  Anemia: Hgb improved after tx yesterday  Hgb 10.2    6.  HTN: -BP 120s to 150s   Keep on current regimen for now.    7. HLD: -Stable, CHO-137, HDL-38, LDL-80, Trig-93 -Continue high intensity atorvastatin  8.  Hyperkalemia: Currently 4.6   Follow      For questions or updates, please contact   Please consult [email or web address] for contact info under Cardiology/STEMI.

## 2017-12-08 NOTE — Consult Note (Addendum)
[facility] Nurse wound consult note Reason for Consult: Reassessment of left elbow unstageable pressure injury, refer to previous WOC assessment on 6/7.  Pressure Injury POA: No Measurement: Greatly decreased in size, .3X.3X.1cm Wound bed: yellow and moist Dressing procedure/placement/frequency: Foam dressing to protect and promote healing. Family member at the bedside to assess wound appearance and discuss plan of care. Please re-consult if further assistance is needed.  Thank-you,  Julien Girt MSN, [place], [place], [place], [place]

## 2017-12-08 NOTE — Progress Notes (Signed)
Admit: 11/29/2017 LOS: 40  46M AoCKD4 related to CIN at time of LHC for STEMI, needs rpt LHC for PCI  Subjective:  LHC yesterday, s/p PCI of CFx and LAD; 163mL IV contrast Stable SCR, K, HCO3 this AM UOP nearly 2L   06/10 0701 - 06/11 0700 In: 1765.5 [P.O.:720; I.V.:595.5; Blood:450] Out: 1875 [JJHER:7408]  Filed Weights   12/06/17 0521 12/07/17 0657 12/08/17 0355  Weight: 86.9 kg (191 lb 8 oz) 86.4 kg (190 lb 7.6 oz) 85.8 kg (189 lb 2.5 oz)    Scheduled Meds: . allopurinol  100 mg Oral BH-q7a  . amiodarone  100 mg Oral Daily  . aspirin  81 mg Oral Daily  . atorvastatin  80 mg Oral q1800  . clopidogrel  75 mg Oral Daily  . darbepoetin (ARANESP) injection - NON-DIALYSIS  100 mcg Subcutaneous Q Fri-1800  . metoprolol tartrate  12.5 mg Oral BID  . pantoprazole  40 mg Oral Daily  . polyethylene glycol  17 g Oral Daily  . sodium chloride flush  3 mL Intravenous Q12H  . sodium chloride flush  3 mL Intravenous Q12H  . terazosin  4 mg Oral QPM  . vitamin B-12  1,000 mcg Oral Daily   Continuous Infusions: . sodium chloride    . sodium chloride     PRN Meds:.sodium chloride, acetaminophen, magnesium hydroxide, nitroGLYCERIN, ondansetron (ZOFRAN) IV, simethicone, sodium chloride flush, sodium chloride flush  Current Labs: reviewed    Physical Exam:  Blood pressure (!) 151/53, pulse (!) 56, temperature 97.8 F (36.6 C), temperature source Oral, resp. rate 20, height 5\' 5"  (1.651 m), weight 85.8 kg (189 lb 2.5 oz), SpO2 100 %. NAD RRR CTAB No LEE  A 1. AoCKD4, returned to baseline renal function 2. CAD, admit with STEMI, not CABG candidate for MV disease, s/p LHC and PCI x2 12/07/17 3. Hyperkalemia, mild, resolved 4. Anemia  P 1. Follow renal function for another 24h 2. Mag was low, repeat today, replete if needed   Pearson Grippe MD 12/08/2017, 12:31 PM  Recent Labs  Lab 12/04/17 0807  12/06/17 0228 12/07/17 0231 12/08/17 0236  NA 137   < > 138 137 136  K 5.2*   <  > 5.5* 5.4* 4.6  CL 112*   < > 112* 111 110  CO2 18*   < > 19* 21* 19*  GLUCOSE 110*   < > 107* 104* 159*  BUN 33*   < > 30* 29* 27*  CREATININE 1.82*   < > 1.74* 1.81* 1.68*  CALCIUM 7.9*   < > 8.3* 8.4* 8.4*  PHOS 3.7  --   --   --   --    < > = values in this interval not displayed.   Recent Labs  Lab 12/05/17 1638 12/06/17 0228 12/07/17 0231 12/08/17 0236  WBC 7.4 8.4 7.7 9.3  NEUTROABS 4.9  --   --   --   HGB 8.7* 8.3* 8.3* 10.2*  HCT 26.1* 25.3* 25.6* 30.5*  MCV 102.4* 103.3* 103.6* 96.8  PLT 171 155 170 171

## 2017-12-08 NOTE — Care Management Important Message (Signed)
Important Message  Patient Details  Name: Paul Kramer MRN: [id number] Date of Birth: 23-May-1930   Medicare Important Message Given:  Yes    Carmelo Reidel P Massie Cogliano 12/08/2017, 3:58 PM

## 2017-12-09 ENCOUNTER — Other Ambulatory Visit: Payer: Self-pay | Admitting: Physician Assistant

## 2017-12-09 DIAGNOSIS — I213 ST elevation (STEMI) myocardial infarction of unspecified site: Secondary | ICD-10-CM

## 2017-12-09 LAB — BASIC METABOLIC PANEL
ANION GAP: 10 (ref 5–15)
BUN: 42 mg/dL — ABNORMAL HIGH (ref 6–20)
CHLORIDE: 106 mmol/L (ref 101–111)
CO2: 17 mmol/L — ABNORMAL LOW (ref 22–32)
Calcium: 8.3 mg/dL — ABNORMAL LOW (ref 8.9–10.3)
Creatinine, Ser: 2.16 mg/dL — ABNORMAL HIGH (ref 0.61–1.24)
GFR, EST AFRICAN AMERICAN: 30 mL/min — AB (ref >60)
GFR, EST NON AFRICAN AMERICAN: 26 mL/min — AB (ref >60)
Glucose, Bld: 188 mg/dL — ABNORMAL HIGH (ref 65–99)
POTASSIUM: 4.5 mmol/L (ref 3.5–5.1)
SODIUM: 133 mmol/L — AB (ref 135–145)

## 2017-12-09 LAB — URINALYSIS, ROUTINE W REFLEX MICROSCOPIC
BILIRUBIN URINE: NEGATIVE
Glucose, UA: NEGATIVE mg/dL
Ketones, ur: NEGATIVE mg/dL
Nitrite: NEGATIVE
PH: 5 (ref 5.0–8.0)
Protein, ur: NEGATIVE mg/dL
SPECIFIC GRAVITY, URINE: 1.005 (ref 1.005–1.030)
WBC, UA: >50 WBC/hpf — ABNORMAL HIGH (ref 0–5)

## 2017-12-09 LAB — CBC
HCT: 28.3 % — ABNORMAL LOW (ref 39.0–52.0)
HEMOGLOBIN: 9.4 g/dL — AB (ref 13.0–17.0)
MCH: 32.4 pg (ref 26.0–34.0)
MCHC: 33.2 g/dL (ref 30.0–36.0)
MCV: 97.6 fL (ref 78.0–100.0)
PLATELETS: 181 10*3/uL (ref 150–400)
RBC: 2.9 MIL/uL — AB (ref 4.22–5.81)
RDW: 16.7 % — ABNORMAL HIGH (ref 11.5–15.5)
WBC: 14.5 10*3/uL — AB (ref 4.0–10.5)

## 2017-12-09 LAB — MAGNESIUM: MAGNESIUM: 1.7 mg/dL (ref 1.7–2.4)

## 2017-12-09 MED ORDER — CLOPIDOGREL BISULFATE 75 MG PO TABS: 75.0000 mg | ORAL_TABLET | Freq: Every day | ORAL | 3 refills | Status: DC

## 2017-12-09 MED ORDER — MAGNESIUM OXIDE 400 (241.3 MG) MG PO TABS: 400.0000 mg | ORAL_TABLET | Freq: Every day | ORAL | 1 refills | Status: DC

## 2017-12-09 MED ORDER — ATORVASTATIN CALCIUM 80 MG PO TABS: 80.0000 mg | ORAL_TABLET | Freq: Every day | ORAL | 3 refills | Status: DC

## 2017-12-09 MED ORDER — AMIODARONE HCL 100 MG PO TABS: 100.0000 mg | ORAL_TABLET | Freq: Every day | ORAL | 3 refills | Status: DC

## 2017-12-09 MED ORDER — METOPROLOL TARTRATE 25 MG PO TABS: 12.5000 mg | ORAL_TABLET | Freq: Two times a day (BID) | ORAL | 3 refills | Status: DC

## 2017-12-09 MED ORDER — MAGNESIUM OXIDE 400 (241.3 MG) MG PO TABS
400.0000 mg | ORAL_TABLET | Freq: Every day | ORAL | Status: DC
  Administered 2017-12-09 – 2017-12-11 (×3): 400 mg via ORAL
  Filled 2017-12-09 (×3): qty 1

## 2017-12-09 MED ORDER — NITROGLYCERIN 0.4 MG SL SUBL: 0.4000 mg | SUBLINGUAL_TABLET | SUBLINGUAL | 3 refills | Status: DC | PRN

## 2017-12-09 MED ORDER — CYANOCOBALAMIN 1000 MCG PO TABS: 1000.0000 ug | ORAL_TABLET | Freq: Every day | ORAL | 3 refills | Status: DC

## 2017-12-09 NOTE — Progress Notes (Signed)
Progress Note  Patient Name: Paul Kramer Date of Encounter: 12/09/2017  Primary Cardiologist: Dr. Glenetta Hew, MD  Subjective    No CP  No dizziness  No SOB    Inpatient Medications    Scheduled Meds: . allopurinol  100 mg Oral BH-q7a  . amiodarone  100 mg Oral Daily  . aspirin  81 mg Oral Daily  . atorvastatin  80 mg Oral q1800  . clopidogrel  75 mg Oral Daily  . darbepoetin (ARANESP) injection - NON-DIALYSIS  100 mcg Subcutaneous Q Fri-1800  . metoprolol tartrate  12.5 mg Oral BID  . pantoprazole  40 mg Oral Daily  . polyethylene glycol  17 g Oral Daily  . sodium chloride flush  3 mL Intravenous Q12H  . sodium chloride flush  3 mL Intravenous Q12H  . terazosin  4 mg Oral QPM  . vitamin B-12  1,000 mcg Oral Daily   Continuous Infusions: . sodium chloride    . sodium chloride     PRN Meds: sodium chloride, acetaminophen, magnesium hydroxide, nitroGLYCERIN, ondansetron (ZOFRAN) IV, simethicone, sodium chloride flush, sodium chloride flush   Vital Signs    Vitals:   12/08/17 1537 12/08/17 2004 12/09/17 0540 12/09/17 0711  BP: (!) 124/54 (!) 121/55 (!) 115/50 (!) 150/75  Pulse: (!) 54 60 (!) 53 (!) 55  Resp: 17 18 19 17   Temp: 97.8 F (36.6 C) 98.3 F (36.8 C) 97.7 F (36.5 C) (!) 97.5 F (36.4 C)  TempSrc:  Oral Oral Oral  SpO2: 99% 97% 95% 97%  Weight:   86.3 kg (190 lb 4.1 oz)   Height:        Intake/Output Summary (Last 24 hours) at 12/09/2017 0803 Last data filed at 12/09/2017 0542 Gross per 24 hour  Intake 720 ml  Output 775 ml  Net -55 ml   Filed Weights   12/07/17 0657 12/08/17 0355 12/09/17 0540  Weight: 86.4 kg (190 lb 7.6 oz) 85.8 kg (189 lb 2.5 oz) 86.3 kg (190 lb 4.1 oz)    Physical Exam   General: NAD   Skin: Warm, dry, intact  Head: Normocephalic, atraumatic, clear, moist mucus membranes. Neck:  JVP is not elevated   Lungs:Clear to ausculation bilaterally. No wheezes, rales, or rhonchi. Breathing is  unlabored. Cardiovascular: RRR with S1 S2. No murmurs, rubs or gallops Abdomen: Soft, non-tender, non-distended with normoactive bowel sounds. No obvious abdominal masses. MSK: Moving extremities   Extremities:Triv edema.     R arm soft with ecchymoses Neuro: Alert and oriented. No focal deficits. No facial asymmetry. MAE spontaneously. Psych: Responds to questions appropriately with normal affect.    Labs    Chemistry Recent Labs  Lab 12/04/17 0807  12/06/17 0228 12/07/17 0231 12/08/17 0236  NA 137   < > 138 137 136  K 5.2*   < > 5.5* 5.4* 4.6  CL 112*   < > 112* 111 110  CO2 18*   < > 19* 21* 19*  GLUCOSE 110*   < > 107* 104* 159*  BUN 33*   < > 30* 29* 27*  CREATININE 1.82*   < > 1.74* 1.81* 1.68*  CALCIUM 7.9*   < > 8.3* 8.4* 8.4*  ALBUMIN 2.8*  --   --   --   --   GFRNONAA 32*   < > 34* 32* 35*  GFRAA 37*   < > 39* 38* 41*  ANIONGAP 7   < > 7 5 7    < > =  values in this interval not displayed.     Hematology Recent Labs  Lab 12/07/17 0231 12/08/17 0236 12/09/17 0225  WBC 7.7 9.3 14.5*  RBC 2.47* 3.15* 2.90*  HGB 8.3* 10.2* 9.4*  HCT 25.6* 30.5* 28.3*  MCV 103.6* 96.8 97.6  MCH 33.6 32.4 32.4  MCHC 32.4 33.4 33.2  RDW 12.6 16.6* 16.7*  PLT 170 171 181    Cardiac Enzymes No results for input(s): TROPONINI in the last 168 hours. No results for input(s): TROPIPOC in the last 168 hours.   BNP Recent Labs  Lab 12/03/17 1141  BNP 201.8*     DDimer No results for input(s): DDIMER in the last 168 hours.   Radiology    No results found.  Telemetry    12/07/17 NSR/SB HR  - Personally Reviewed  ECG    No new tracings as of  12/07/17- Personally Reviewed  Cardiac Studies   11/29/2017 Emergent Cardiac Cath:    There is mild left ventricular systolic dysfunction.  The left ventricular ejection fraction is 45-50% by visual estimate.  Prox RCA to Mid RCA lesion is 80% stenosed. Mid RCA to Dist RCA lesion is 100% stenosed with 95% stenosed side  branch in Post Atrio.  Ost LM to Mid LM lesion is 40% stenosed.  Balloon angioplasty was attempted using a BALLOON SAPPHIRE 2.0X12. -- was unable to cross the 100% stenosis & no reduction in 80% lesion.  Mid Cx to Dist Cx lesion is 95% stenosed (thrombotic) with 55% stenosed side branch in Ost 2nd Mrg. -- LIKELY THE CULPRIT LESION  Ost LAD to Prox LAD lesion is 85% stenosed.   Severe three-vessel disease with 100% CTO mid RCA, severe 95% thrombotic lesion trifurcation lesion of the distal Cx-OM 2-LPL 1 and proximal LAD 85% eccentric lesion.  Unable to cross RCA lesion confirming CTO (PDA fills via collaterals from septal perforators)  Mildly reduced LVEF of roughly 45 to 50% with inferoapical hypokinesis  Since the patient was chest pain-free on medications, and the RCA appears to be chronically occluded with complex trifurcation circumflex lesion as a likely culprit, I felt it more prudent to stop at this point to allow time for further clarification of the best course of action going forward. His CKD would also warrant staged PCI if indicated as opposed to continued contrast use.   ------------------------------------------------------------------- 11/30/2017 ECHO:  - Left ventricle: The cavity size was normal. There was mild focal basal hypertrophy of the septum. Systolic function was mildly reduced. The estimated ejection fraction was in the range of 45% to 50%. There is hypokinesis of the inferior myocardium. There was no evidence of elevated ventricular filling pressure by Doppler parameters. - Aortic valve: Valve mobility was restricted. There was moderate regurgitation. - Mitral valve: There was mild to moderate regurgitation. - Right ventricle: The cavity size was mildly dilated. Wall thickness was normal   Cath/PCI 12/07/17   LESION #1 & 1.5: Mid Cx to Dist Cx lesion is 95% stenosed with 55% stenosed side branch in Ost 2nd Mrg.  Scoring balloon  angioplasty was performed on the side branch using a BALLOON WOLVERINE 2.50X10.  Post intervention, the side branch was reduced to 20% residual stenosis.  A drug-eluting stent was successfully placed in the main Circumflex using a STENT SYNERGY DES 3X16. -Postdilated to 3.3 mm  Post intervention, there is a 0% residual stenosis.  LESION #2: Prox LAD lesion is 85% stenosed.  A drug-eluting stent was successfully placed using a STENT SYNERGY DES 3X16. -  Post-dilated to 3.6 mm  Post intervention, there is a 0% residual stenosis.  _______________________________________________  LV end diastolic pressure is moderately elevated.    Successful DES PCI of mid-dist Cxacross2nd Mrg& AVG Cx -Synergy DES 3.0 mmx16 mm (3.3 mm) with Cutting Balloon PTCA of ostial2nd Mrgusing a 2.5 mm Wolverine balloon.  Successful PCI ofOst LAD to Prox LAD lesion is 85% stenosed.-With a Synergy DES 3.0 mmx16 mm postdilated to 3.28mm   Moderately Elevated LVEDP   Transfer to 6 Central post procedure unit for sheath removal with manual pressure. Monitor renal function and hemoglobin/hematocrit Dual antiplatelet therapy for minimum 1 year        Patient Profile     84 y.o. male whopresented with acute onset exertional dyspnea and diaphoresis and chest discomfort. He was initially noted to be in A. fib with subtle EKG changes, however with recurrence of active chest pain he developed1 to 2 mm inferior elevations as well as some lateral ST elevation.He was taken to Cath Lab because of these elevations and positive troponin.  Assessment & Plan    1. STEMI: -Patient presented 11/29/2017 with concern for STEMI>>taken emergently to the Cath Lab which revealed severe three-vessel disease with CTO of RCA, severe 95% thrombotic lesion trifurcation lesion of the distal Cx-OM 2-LPL 1 and proximal LAD 85% eccentric lesion. Pt underwent intervention MOnday as noted above     DOing well  Keep on dual  antiplt htherapy    2.  New onset atrial fibrillation:  Pt had afib on admit  New for him    He was treated with IV amiodarone and converted   He is now on low dose amiodarone 100 mg    HR 50s to 60s   No recurrence   WOuld keep on current regimen   Follow HR and BP closely   Would like to avoid anticoag CHADSVASC6 On metoprolol 12.5 bid   May need to back down   Or may need to back off of amio if HR declines significantly    3.  Chronic kidney disease, stage IV:  Cr a little better  1.68   Off of ACE I   Can be readdressed as outpt    4.  History of BPH: -Foley out today    Has appt with Urology in July.   5.  Anemia: Hgb 9,4  WIll need close f/u as outpt  6.  HTN: -BP labile 110s to 150s     Keep on current regimen for now.    7. HLD: -Stable, CHO-137, HDL-38, LDL-80, Trig-93 -Continue high intensity atorvastatin  8.  Hyperkalemia: Currently 4.6   Follow      For questions or updates, please contact   Please consult [email or web address] for contact info under Cardiology/STEMI.

## 2017-12-09 NOTE — Telephone Encounter (Signed)
Pt currently admitted.

## 2017-12-09 NOTE — Progress Notes (Signed)
Pt walked with daughter and RW. Sts he felt well. No questions, eager to d/c. Yves Dill CES, ACSM 10:04 AM 12/09/2017

## 2017-12-09 NOTE — Progress Notes (Signed)
Admit: 11/29/2017 LOS: 11  74M AoCKD4 related to CIN at time of LHC for STEMI, needs rpt LHC for PCI  Subjective:  No new events SCR up this AM to 2.16, UOP off some from days before  06/11 0701 - 06/12 0700 In: 720 [P.O.:720] Out: 775 [Urine:775]  Filed Weights   12/07/17 0657 12/08/17 0355 12/09/17 0540  Weight: 86.4 kg (190 lb 7.6 oz) 85.8 kg (189 lb 2.5 oz) 86.3 kg (190 lb 4.1 oz)    Scheduled Meds: . allopurinol  100 mg Oral BH-q7a  . amiodarone  100 mg Oral Daily  . aspirin  81 mg Oral Daily  . atorvastatin  80 mg Oral q1800  . clopidogrel  75 mg Oral Daily  . darbepoetin (ARANESP) injection - NON-DIALYSIS  100 mcg Subcutaneous Q Fri-1800  . magnesium oxide  400 mg Oral Daily  . metoprolol tartrate  12.5 mg Oral BID  . pantoprazole  40 mg Oral Daily  . polyethylene glycol  17 g Oral Daily  . sodium chloride flush  3 mL Intravenous Q12H  . sodium chloride flush  3 mL Intravenous Q12H  . terazosin  4 mg Oral QPM  . vitamin B-12  1,000 mcg Oral Daily   Continuous Infusions: . sodium chloride    . sodium chloride     PRN Meds:.sodium chloride, acetaminophen, magnesium hydroxide, nitroGLYCERIN, ondansetron (ZOFRAN) IV, simethicone, sodium chloride flush, sodium chloride flush  Current Labs: reviewed    Physical Exam:  Blood pressure (!) 150/75, pulse (!) 55, temperature (!) 97.5 F (36.4 C), temperature source Oral, resp. rate 17, height 5\' 5"  (1.651 m), weight 86.3 kg (190 lb 4.1 oz), SpO2 97 %. NAD RRR CTAB No LEE  A 1. AoCKD4, returned to baseline renal function prior to [facility] but 24h post has inc SCr 2. CAD, admit with STEMI, not CABG candidate for MV disease, s/p LHC and PCI x2 12/07/17 3. Hyperkalemia, mild, resolved 4. Anemia  P 1. Follow renal function for another 24h; would not discharge today with rising SCr 2. Already has f/u appt 7/17 at [facility] with Dr. Stefani Dama MD 12/09/2017, 12:04 PM  Recent Labs  Lab 12/04/17 7867  12/07/17 0231  12/08/17 0236 12/09/17 0822  NA 137   < > 137 136 133*  K 5.2*   < > 5.4* 4.6 4.5  CL 112*   < > 111 110 106  CO2 18*   < > 21* 19* 17*  GLUCOSE 110*   < > 104* 159* 188*  BUN 33*   < > 29* 27* 42*  CREATININE 1.82*   < > 1.81* 1.68* 2.16*  CALCIUM 7.9*   < > 8.4* 8.4* 8.3*  PHOS 3.7  --   --   --   --    < > = values in this interval not displayed.   Recent Labs  Lab 12/05/17 1638  12/07/17 0231 12/08/17 0236 12/09/17 0225  WBC 7.4   < > 7.7 9.3 14.5*  NEUTROABS 4.9  --   --   --   --   HGB 8.7*   < > 8.3* 10.2* 9.4*  HCT 26.1*   < > 25.6* 30.5* 28.3*  MCV 102.4*   < > 103.6* 96.8 97.6  PLT 171   < > 170 171 181   < > = values in this interval not displayed.

## 2017-12-10 LAB — BASIC METABOLIC PANEL
Anion gap: 7 (ref 5–15)
BUN: 37 mg/dL — ABNORMAL HIGH (ref 6–20)
CHLORIDE: 109 mmol/L (ref 101–111)
CO2: 20 mmol/L — AB (ref 22–32)
CREATININE: 2.15 mg/dL — AB (ref 0.61–1.24)
Calcium: 8.1 mg/dL — ABNORMAL LOW (ref 8.9–10.3)
GFR calc non Af Amer: 26 mL/min — ABNORMAL LOW (ref >60)
GFR, EST AFRICAN AMERICAN: 31 mL/min — AB (ref >60)
Glucose, Bld: 135 mg/dL — ABNORMAL HIGH (ref 65–99)
Potassium: 4.6 mmol/L (ref 3.5–5.1)
Sodium: 136 mmol/L (ref 135–145)

## 2017-12-10 LAB — CBC
HCT: 30.8 % — ABNORMAL LOW (ref 39.0–52.0)
Hemoglobin: 10.1 g/dL — ABNORMAL LOW (ref 13.0–17.0)
MCH: 32.3 pg (ref 26.0–34.0)
MCHC: 32.8 g/dL (ref 30.0–36.0)
MCV: 98.4 fL (ref 78.0–100.0)
PLATELETS: 188 10*3/uL (ref 150–400)
RBC: 3.13 MIL/uL — AB (ref 4.22–5.81)
RDW: 16.7 % — ABNORMAL HIGH (ref 11.5–15.5)
WBC: 17.3 10*3/uL — AB (ref 4.0–10.5)

## 2017-12-10 MED ORDER — CEPHALEXIN 500 MG PO CAPS: 500.0000 mg | ORAL_CAPSULE | Freq: Two times a day (BID) | ORAL | Status: DC

## 2017-12-10 MED ORDER — CEPHALEXIN 500 MG PO CAPS
500.0000 mg | ORAL_CAPSULE | Freq: Two times a day (BID) | ORAL | Status: DC
  Filled 2017-12-10: qty 1

## 2017-12-10 MED ORDER — PSYLLIUM 95 % PO PACK
1.0000 | PACK | Freq: Every day | ORAL | Status: DC
  Administered 2017-12-11: 1 via ORAL
  Filled 2017-12-10 (×2): qty 1

## 2017-12-10 MED ORDER — DOCUSATE SODIUM 100 MG PO CAPS
100.0000 mg | ORAL_CAPSULE | Freq: Every day | ORAL | Status: DC | PRN
  Administered 2017-12-10: 100 mg via ORAL
  Filled 2017-12-10: qty 1

## 2017-12-10 MED ORDER — METOPROLOL SUCCINATE ER 25 MG PO TB24
25.0000 mg | ORAL_TABLET | Freq: Every day | ORAL | Status: DC
  Administered 2017-12-10 – 2017-12-11 (×2): 25 mg via ORAL
  Filled 2017-12-10 (×2): qty 1

## 2017-12-10 MED ORDER — CEPHALEXIN 250 MG PO CAPS
250.0000 mg | ORAL_CAPSULE | Freq: Two times a day (BID) | ORAL | Status: DC
  Filled 2017-12-10: qty 1

## 2017-12-10 MED ORDER — SODIUM CHLORIDE 0.9 % IV SOLN
1.0000 g | Freq: Once | INTRAVENOUS | Status: AC
  Administered 2017-12-10: 1 g via INTRAVENOUS
  Filled 2017-12-10: qty 10

## 2017-12-10 NOTE — Progress Notes (Addendum)
Progress Note  Patient Name: Paul Kramer Date of Encounter: 12/10/2017  Primary Cardiologist: Dr. Glenetta Hew, MD  Subjective   No chest pain, + nausea this am, did not eat much. No SOB. +dysuria, +constipation. Takes metamucil at home.  Inpatient Medications    Scheduled Meds: . allopurinol  100 mg Oral BH-q7a  . amiodarone  100 mg Oral Daily  . aspirin  81 mg Oral Daily  . atorvastatin  80 mg Oral q1800  . clopidogrel  75 mg Oral Daily  . darbepoetin (ARANESP) injection - NON-DIALYSIS  100 mcg Subcutaneous Q Fri-1800  . magnesium oxide  400 mg Oral Daily  . metoprolol tartrate  12.5 mg Oral BID  . pantoprazole  40 mg Oral Daily  . polyethylene glycol  17 g Oral Daily  . sodium chloride flush  3 mL Intravenous Q12H  . sodium chloride flush  3 mL Intravenous Q12H  . terazosin  4 mg Oral QPM  . vitamin B-12  1,000 mcg Oral Daily   Continuous Infusions: . sodium chloride    . sodium chloride     PRN Meds: sodium chloride, acetaminophen, magnesium hydroxide, nitroGLYCERIN, ondansetron (ZOFRAN) IV, simethicone, sodium chloride flush, sodium chloride flush   Vital Signs    Vitals:   12/09/17 1500 12/09/17 2000 12/09/17 2046 12/10/17 0706  BP: (!) 131/50  (!) 135/55 (!) 109/44  Pulse: (!) 52  64 65  Resp: (!) 21 19 18 18   Temp: 97.7 F (36.5 C)  98.1 F (36.7 C) 99.7 F (37.6 C)  TempSrc: Oral  Oral Oral  SpO2: 96%  98% 94%  Weight:    190 lb 7.6 oz (86.4 kg)  Height:        Intake/Output Summary (Last 24 hours) at 12/10/2017 0806 Last data filed at 12/10/2017 0711 Gross per 24 hour  Intake 840 ml  Output 2200 ml  Net -1360 ml   Filed Weights   12/08/17 0355 12/09/17 0540 12/10/17 0706  Weight: 189 lb 2.5 oz (85.8 kg) 190 lb 4.1 oz (86.3 kg) 190 lb 7.6 oz (86.4 kg)    Physical Exam   General: NAD   Skin: Warm, dry, intact  Head: Normocephalic, atraumatic, clear, moist mucus membranes. Neck:  JVP is mildly elevated   Lungs:Clear to ausculation  bilaterally. No wheezes, rales, or rhonchi. Breathing is unlabored. Cardiovascular: RRR with S1 S2. Soft murmur, no rubs or gallops Abdomen: Soft, non-tender, non-distended with normoactive bowel sounds. No obvious abdominal masses. MSK: Moving extremities Extremities:Triv edema.   R arm soft with ecchymosis gradually improving.  Neuro: Alert and oriented. No focal deficits. No facial asymmetry. MAE spontaneously. Psych: Responds to questions appropriately with normal affect.    Labs    Chemistry Recent Labs  Lab 12/04/17 [phone]  12/08/17 0236 12/09/17 0822 12/10/17 0256  NA 137   < > 136 133* 136  K 5.2*   < > 4.6 4.5 4.6  CL 112*   < > 110 106 109  CO2 18*   < > 19* 17* 20*  GLUCOSE 110*   < > 159* 188* 135*  BUN 33*   < > 27* 42* 37*  CREATININE 1.82*   < > 1.68* 2.16* 2.15*  CALCIUM 7.9*   < > 8.4* 8.3* 8.1*  ALBUMIN 2.8*  --   --   --   --   GFRNONAA 32*   < > 35* 26* 26*  GFRAA 37*   < > 41* 30* 31*  ANIONGAP 7   < > 7 10 7    < > = values in this interval not displayed.     Hematology Recent Labs  Lab 12/08/17 0236 12/09/17 0225 12/10/17 0256  WBC 9.3 14.5* 17.3*  RBC 3.15* 2.90* 3.13*  HGB 10.2* 9.4* 10.1*  HCT 30.5* 28.3* 30.8*  MCV 96.8 97.6 98.4  MCH 32.4 32.4 32.3  MCHC 33.4 33.2 32.8  RDW 16.6* 16.7* 16.7*  PLT 171 181 188   BNP Recent Labs  Lab 12/03/17 1141  BNP 201.8*    Urinalysis    Component Value Date/Time   COLORURINE STRAW (A) 12/09/2017 1809   APPEARANCEUR HAZY (A) 12/09/2017 1809   LABSPEC 1.005 12/09/2017 1809   PHURINE 5.0 12/09/2017 1809   GLUCOSEU NEGATIVE 12/09/2017 1809   HGBUR MODERATE (A) 12/09/2017 1809   BILIRUBINUR NEGATIVE 12/09/2017 1809   KETONESUR NEGATIVE 12/09/2017 1809   PROTEINUR NEGATIVE 12/09/2017 1809   NITRITE NEGATIVE 12/09/2017 1809   LEUKOCYTESUR LARGE (A) 12/09/2017 1809     Radiology    No results found.  Telemetry    12/07/17 NSR/SB HR  - Personally Reviewed  ECG    No new tracings as of   12/07/17- Personally Reviewed  Cardiac Studies   11/29/2017 Emergent Cardiac Cath:    There is mild left ventricular systolic dysfunction.  The left ventricular ejection fraction is 45-50% by visual estimate.  Prox RCA to Mid RCA lesion is 80% stenosed. Mid RCA to Dist RCA lesion is 100% stenosed with 95% stenosed side branch in Post Atrio.  Ost LM to Mid LM lesion is 40% stenosed.  Balloon angioplasty was attempted using a BALLOON SAPPHIRE 2.0X12. -- was unable to cross the 100% stenosis & no reduction in 80% lesion.  Mid Cx to Dist Cx lesion is 95% stenosed (thrombotic) with 55% stenosed side branch in Ost 2nd Mrg. -- LIKELY THE CULPRIT LESION  Ost LAD to Prox LAD lesion is 85% stenosed.   Severe three-vessel disease with 100% CTO mid RCA, severe 95% thrombotic lesion trifurcation lesion of the distal Cx-OM 2-LPL 1 and proximal LAD 85% eccentric lesion.  Unable to cross RCA lesion confirming CTO (PDA fills via collaterals from septal perforators)  Mildly reduced LVEF of roughly 45 to 50% with inferoapical hypokinesis  Since the patient was chest pain-free on medications, and the RCA appears to be chronically occluded with complex trifurcation circumflex lesion as a likely culprit, I felt it more prudent to stop at this point to allow time for further clarification of the best course of action going forward. His CKD would also warrant staged PCI if indicated as opposed to continued contrast use.   ------------------------------------------------------------------- 11/30/2017 ECHO:  - Left ventricle: The cavity size was normal. There was mild focal basal hypertrophy of the septum. Systolic function was mildly reduced. The estimated ejection fraction was in the range of 45% to 50%. There is hypokinesis of the inferior myocardium. There was no evidence of elevated ventricular filling pressure by Doppler parameters. - Aortic valve: Valve mobility was restricted.  There was moderate regurgitation. - Mitral valve: There was mild to moderate regurgitation. - Right ventricle: The cavity size was mildly dilated. Wall thickness was normal   Cath/PCI 12/07/17   LESION #1 & 1.5: Mid Cx to Dist Cx lesion is 95% stenosed with 55% stenosed side branch in Ost 2nd Mrg.  Scoring balloon angioplasty was performed on the side branch using a BALLOON WOLVERINE 2.50X10.  Post intervention, the side branch was reduced  to 20% residual stenosis.  A drug-eluting stent was successfully placed in the main Circumflex using a STENT SYNERGY DES 3X16. -Postdilated to 3.3 mm  Post intervention, there is a 0% residual stenosis.  LESION #2: Prox LAD lesion is 85% stenosed.  A drug-eluting stent was successfully placed using a STENT SYNERGY DES 3X16. - Post-dilated to 3.6 mm  Post intervention, there is a 0% residual stenosis.  _______________________________________________  LV end diastolic pressure is moderately elevated.    Successful DES PCI of mid-dist Cxacross2nd Mrg& AVG Cx -Synergy DES 3.0 mmx16 mm (3.3 mm) with Cutting Balloon PTCA of ostial2nd Mrgusing a 2.5 mm Wolverine balloon.  Successful PCI ofOst LAD to Prox LAD lesion is 85% stenosed.-With a Synergy DES 3.0 mmx16 mm postdilated to 3.75mm   Moderately Elevated LVEDP   Transfer to 6 Central post procedure unit for sheath removal with manual pressure. Monitor renal function and hemoglobin/hematocrit Dual antiplatelet therapy for minimum 1 year      Patient Profile     84 y.o. male whopresented with acute onset exertional dyspnea and diaphoresis and chest discomfort. He was initially noted to be in A. fib with subtle EKG changes, however with recurrence of active chest pain he developed1 to 2 mm inferior elevations as well as some lateral ST elevation.He was taken to Cath Lab because of these elevations and positive troponin.  Assessment & Plan    1. STEMI: -Patient  presented 11/29/2017 with concern for STEMI>>taken emergently to the Cath Lab which revealed severe three-vessel disease with CTO of RCA, severe 95% thrombotic lesion trifurcation lesion of the distal Cx-OM 2-LPL 1 and proximal LAD 85% eccentric lesion. Pt underwent intervention Monday as noted above     Doing well  Keep on dual antiplt therapy    2.  New onset atrial fibrillation:  Pt had afib on admit  New for him    He was treated with IV amiodarone and converted   He is now on low dose amiodarone 100 mg. Bradycardic at times but no recurrence of Afib.   Follow HR and BP closely   Would like to avoid anticoag CHADSVASC 6 On metoprolol 12.5 bid, held 06/12 am due to HR < 65. Change parameters to < 60. HR high 40s-50s at times, will change BB to Toprol XL 25 mg qd and see how tolerated.   May need to back down BB, or back off of amio if pt symptomatic from low HR     3.  Chronic kidney disease, stage IV:  Renal following. Cr worsened after PCI, but no sig change last 24 hr, 1.68>>2.16>>2.15.   Off of ACE I, recheck as outpt   4.  History of BPH: -Foley out  - urinating ok, but has dysuria and UA is abnl Start Keflex, renal dosing   Keep appt with Urology in July.   5.  Anemia: Hgb 10.1 today  WIll need close f/u as outpt  6.  HTN: -BP labile 109-150 last 24. Keep on current regimen for now.    7. HLD: -Stable, CHO-137, HDL-38, LDL-80, Trig-93 -Continue high intensity atorvastatin  8.  Hyperkalemia: Currently 4.6   Follow   9. Constipation - give enema today and restart home Metamucil - use Miralax and Colace prn     Rosaria Ferries, PA-C 12/10/2017 8:59 AM Beeper [phone]  PT seen and examined   I agree with findings as noted by R Barrett Pt a little nauseated this AM   No SOB On exam  T m 99   Lungs CTA   Cardiac RRR  No S3   Ext R arm with ecchymoses   Nontendr    Tr LE edema  Labs signif for WBC 17.4   Was 9.9 2 days ago Hgb is stable   Cr 2.15 Urinewith leukocytes  but nitrite negative    DIscussed above with renal   Will give IV ceftriaxone for UTI. Continue other meds  Check CBC and BMET in AM    Elevate R arm and legs when not active    Possible d/c in am    For questions or updates, please contact   Please consult [email or web address] for contact info under Cardiology/STEMI.

## 2017-12-10 NOTE — Progress Notes (Signed)
CARDIAC REHAB PHASE I   PRE:  Rate/Rhythm: 63 SR    BP: sitting 126/38    SaO2:   MODE:  Ambulation: 860 ft   POST:  Rate/Rhythm: 80 SR    BP: sitting 134/45     SaO2:   Tolerated fairly well. Feels some better after receiving tylenol for fever. Quick pace with RW. Needs reminders at times to stay controlled, especially with obstacles. Tired/SOB after walk. To recliner.  [phone]   [place], ACSM 12/10/2017 10:31 AM

## 2017-12-10 NOTE — Progress Notes (Signed)
Called by RN regarding nonsustained VT, 9 beats.  Reviewed labs, potassium was 4.6 today, magnesium was 1.7 yesterday and he is on supplement, recheck in a.m.  ECG rechecked and reviewed, QT/QTc is minimally improved from yesterday.  Reviewed dated with Dr. Harrington Challenger, no further action to be taken at this time.  We will recheck magnesium in a.m.  No increase in beta-blocker is possible because of resting bradycardia.  Continue amiodarone  Rosaria Ferries, PA-C 12/10/2017 3:55 PM Beeper [phone]

## 2017-12-10 NOTE — Progress Notes (Deleted)
PHARMACY NOTE:  ANTIMICROBIAL RENAL DOSAGE ADJUSTMENT  Current antimicrobial regimen includes a mismatch between antimicrobial dosage and estimated renal function.  As per policy approved by the Pharmacy & Therapeutics and Medical Executive Committees, the antimicrobial dosage will be adjusted accordingly.  Current antimicrobial dosage:  Keflex 500mg  po q12  Indication: UTI  Renal Function:  Estimated Creatinine Clearance: 25.4 mL/min (A) (by C-G formula based on SCr of 2.15 mg/dL (H)).     Antimicrobial dosage has been changed to: Keflex 250mg  po q12  Additional comments:   Thank you for allowing pharmacy to be a part of this patient's care.  Hildred Laser, PharmD Clinical Pharmacist Clinical phone from 8:30-4:00 is [phone] After 4pm, please call Main Rx [phone]) for assistance. 12/10/2017 8:59 AM

## 2017-12-10 NOTE — Progress Notes (Signed)
Admit: 11/29/2017 LOS: 53  71M AoCKD4 related to CIN at time of LHC for STEMI, needs rpt LHC for PCI  Subjective:  Stable renal function overnight, urine output 1.7 L Some nausea this morning, white blood cell count up to 17, urine analysis with pyuria  06/12 0701 - 06/13 0700 In: 840 [P.O.:840] Out: 1700 [Urine:1700]  Filed Weights   12/08/17 0355 12/09/17 0540 12/10/17 0706  Weight: 85.8 kg (189 lb 2.5 oz) 86.3 kg (190 lb 4.1 oz) 86.4 kg (190 lb 7.6 oz)    Scheduled Meds: . allopurinol  100 mg Oral BH-q7a  . amiodarone  100 mg Oral Daily  . aspirin  81 mg Oral Daily  . atorvastatin  80 mg Oral q1800  . [START ON 12/11/2017] cephALEXin  500 mg Oral Q12H  . clopidogrel  75 mg Oral Daily  . darbepoetin (ARANESP) injection - NON-DIALYSIS  100 mcg Subcutaneous Q Fri-1800  . magnesium oxide  400 mg Oral Daily  . metoprolol succinate  25 mg Oral Daily  . pantoprazole  40 mg Oral Daily  . polyethylene glycol  17 g Oral Daily  . psyllium  1 packet Oral Daily  . sodium chloride flush  3 mL Intravenous Q12H  . sodium chloride flush  3 mL Intravenous Q12H  . terazosin  4 mg Oral QPM  . vitamin B-12  1,000 mcg Oral Daily   Continuous Infusions: . sodium chloride     PRN Meds:.sodium chloride, acetaminophen, docusate sodium, magnesium hydroxide, nitroGLYCERIN, ondansetron (ZOFRAN) IV, simethicone, sodium chloride flush, sodium chloride flush  Current Labs: reviewed    Physical Exam:  Blood pressure (!) 134/45, pulse 65, temperature 99.1 F (37.3 C), temperature source Oral, resp. rate 20, height 5\' 5"  (1.651 m), weight 86.4 kg (190 lb 7.6 oz), SpO2 95 %. NAD RRR CTAB No LEE  A 1. AoCKD4, returned to baseline renal function prior to [facility] but 24h post has inc SCr 2. CAD, admit with STEMI, not CABG candidate for MV disease, s/p LHC and PCI x2 12/07/17 3. Hyperkalemia, mild, resolved 4. Anemia 5. Leukocysosis and Pyuria, likely UTI  P 1. Discussed with Dr. Harrington Challenger, keep another  24h, ABX for UTI 2. Will cont to follow renal function 3. Already has f/u appt 7/17 at [place] with Dr. Stefani Dama MD 12/10/2017, 12:15 PM  Recent Labs  Lab 12/04/17 6440  12/08/17 0236 12/09/17 0822 12/10/17 0256  NA 137   < > 136 133* 136  K 5.2*   < > 4.6 4.5 4.6  CL 112*   < > 110 106 109  CO2 18*   < > 19* 17* 20*  GLUCOSE 110*   < > 159* 188* 135*  BUN 33*   < > 27* 42* 37*  CREATININE 1.82*   < > 1.68* 2.16* 2.15*  CALCIUM 7.9*   < > 8.4* 8.3* 8.1*  PHOS 3.7  --   --   --   --    < > = values in this interval not displayed.   Recent Labs  Lab 12/05/17 1638  12/08/17 0236 12/09/17 0225 12/10/17 0256  WBC 7.4   < > 9.3 14.5* 17.3*  NEUTROABS 4.9  --   --   --   --   HGB 8.7*   < > 10.2* 9.4* 10.1*  HCT 26.1*   < > 30.5* 28.3* 30.8*  MCV 102.4*   < > 96.8 97.6 98.4  PLT 171   < > 171 181  188   < > = values in this interval not displayed.

## 2017-12-11 ENCOUNTER — Telehealth (HOSPITAL_COMMUNITY): Payer: Self-pay

## 2017-12-11 LAB — BASIC METABOLIC PANEL
Anion gap: 6 (ref 5–15)
BUN: 34 mg/dL — ABNORMAL HIGH (ref 6–20)
CALCIUM: 7.8 mg/dL — AB (ref 8.9–10.3)
CO2: 20 mmol/L — ABNORMAL LOW (ref 22–32)
Chloride: 108 mmol/L (ref 101–111)
Creatinine, Ser: 2.08 mg/dL — ABNORMAL HIGH (ref 0.61–1.24)
GFR, EST AFRICAN AMERICAN: 32 mL/min — AB (ref >60)
GFR, EST NON AFRICAN AMERICAN: 27 mL/min — AB (ref >60)
Glucose, Bld: 142 mg/dL — ABNORMAL HIGH (ref 65–99)
Potassium: 4.5 mmol/L (ref 3.5–5.1)
SODIUM: 134 mmol/L — AB (ref 135–145)

## 2017-12-11 LAB — CBC
HCT: 29.3 % — ABNORMAL LOW (ref 39.0–52.0)
HCT: 29.4 % — ABNORMAL LOW (ref 39.0–52.0)
Hemoglobin: 9.3 g/dL — ABNORMAL LOW (ref 13.0–17.0)
Hemoglobin: 9.7 g/dL — ABNORMAL LOW (ref 13.0–17.0)
MCH: 32.1 pg (ref 26.0–34.0)
MCH: 33.2 pg (ref 26.0–34.0)
MCHC: 31.7 g/dL (ref 30.0–36.0)
MCHC: 33 g/dL (ref 30.0–36.0)
MCV: 101 fL — ABNORMAL HIGH (ref 78.0–100.0)
MCV: 100.7 fL — ABNORMAL HIGH (ref 78.0–100.0)
PLATELETS: 158 10*3/uL (ref 150–400)
PLATELETS: 173 10*3/uL (ref 150–400)
RBC: 2.9 MIL/uL — ABNORMAL LOW (ref 4.22–5.81)
RBC: 2.92 MIL/uL — ABNORMAL LOW (ref 4.22–5.81)
RDW: 16.9 % — AB (ref 11.5–15.5)
RDW: 17 % — ABNORMAL HIGH (ref 11.5–15.5)
WBC: 19.3 10*3/uL — AB (ref 4.0–10.5)
WBC: 16.7 10*3/uL — AB (ref 4.0–10.5)

## 2017-12-11 LAB — MAGNESIUM: Magnesium: 1.7 mg/dL (ref 1.7–2.4)

## 2017-12-11 MED ORDER — NITROGLYCERIN 0.4 MG SL SUBL: 0.4000 mg | SUBLINGUAL_TABLET | SUBLINGUAL | 3 refills | Status: DC | PRN

## 2017-12-11 MED ORDER — MAGNESIUM OXIDE 400 (241.3 MG) MG PO TABS: 400.0000 mg | ORAL_TABLET | Freq: Every day | ORAL | 1 refills | Status: DC

## 2017-12-11 MED ORDER — CYANOCOBALAMIN 1000 MCG PO TABS: 1000.0000 ug | ORAL_TABLET | Freq: Every day | ORAL | 3 refills | Status: DC

## 2017-12-11 MED ORDER — AMIODARONE HCL 100 MG PO TABS: 100.0000 mg | ORAL_TABLET | Freq: Every day | ORAL | 3 refills | Status: DC

## 2017-12-11 MED ORDER — ATORVASTATIN CALCIUM 80 MG PO TABS
80.0000 mg | ORAL_TABLET | Freq: Every day | ORAL | 3 refills | Status: DC
Start: 2017-12-11 — End: 2018-01-22

## 2017-12-11 MED ORDER — SODIUM CHLORIDE 0.9 % IV SOLN
1.0000 g | Freq: Once | INTRAVENOUS | Status: AC
  Administered 2017-12-11: 1 g via INTRAVENOUS
  Filled 2017-12-11: qty 10

## 2017-12-11 MED ORDER — CLOPIDOGREL BISULFATE 75 MG PO TABS: 75.0000 mg | ORAL_TABLET | Freq: Every day | ORAL | 3 refills | Status: DC

## 2017-12-11 NOTE — Telephone Encounter (Signed)
#  3 -Pt still currently admitted.

## 2017-12-11 NOTE — Telephone Encounter (Signed)
Patients insurance is active and benefits verified through Medicare A/B - No co-pay, deductible amount of $185.00/$185.00 has been met, no out of pocket, 20% co-insurance, and no pre-authorization is required. Passport/reference [phone]  Will contact patient to see if he is interested in the Cardiac Rehab Program. If interested, patient will need to complete follow up appt. Once completed, patient will be contacted for scheduling upon review by the RN Navigator.

## 2017-12-11 NOTE — Progress Notes (Signed)
Pt feeling well this am, VSS. However has had some incontinence of stool and nervous to walk. Declined. I reinforced walking x3 a day at home and meds.  Yves Dill CES, ACSM 8:31 AM 12/11/2017 [phone]

## 2017-12-11 NOTE — Progress Notes (Signed)
Progress Note  Patient Name: Paul Kramer Date of Encounter: 12/11/2017  Primary Cardiologist: Dr. Glenetta Hew, MD  Subjective   Feeling better today   No CP   No SOB   Lots of bowel movements   Loose but no diarrhea (had been treated for constipation).  Inpatient Medications    Scheduled Meds: . allopurinol  100 mg Oral BH-q7a  . amiodarone  100 mg Oral Daily  . aspirin  81 mg Oral Daily  . atorvastatin  80 mg Oral q1800  . cephALEXin  500 mg Oral Q12H  . clopidogrel  75 mg Oral Daily  . darbepoetin (ARANESP) injection - NON-DIALYSIS  100 mcg Subcutaneous Q Fri-1800  . magnesium oxide  400 mg Oral Daily  . metoprolol succinate  25 mg Oral Daily  . pantoprazole  40 mg Oral Daily  . polyethylene glycol  17 g Oral Daily  . psyllium  1 packet Oral Daily  . sodium chloride flush  3 mL Intravenous Q12H  . sodium chloride flush  3 mL Intravenous Q12H  . terazosin  4 mg Oral QPM  . vitamin B-12  1,000 mcg Oral Daily   Continuous Infusions: . sodium chloride     PRN Meds: sodium chloride, acetaminophen, docusate sodium, magnesium hydroxide, nitroGLYCERIN, ondansetron (ZOFRAN) IV, simethicone, sodium chloride flush, sodium chloride flush   Vital Signs    Vitals:   12/10/17 1928 12/10/17 2000 12/10/17 2100 12/11/17 0305  BP: (!) 136/42   (!) 123/55  Pulse: 66   62  Resp: (!) 24 18 (!) 22 13  Temp: (!) 101.4 F (38.6 C)  99 F (37.2 C) 98.2 F (36.8 C)  TempSrc: Oral  Oral Oral  SpO2: 98%   97%  Weight:    89.9 kg (198 lb 3.1 oz)  Height:        Intake/Output Summary (Last 24 hours) at 12/11/2017 0640 Last data filed at 12/10/2017 2000 Gross per 24 hour  Intake 340 ml  Output 700 ml  Net -360 ml   Filed Weights   12/09/17 0540 12/10/17 0706 12/11/17 0305  Weight: 86.3 kg (190 lb 4.1 oz) 86.4 kg (190 lb 7.6 oz) 89.9 kg (198 lb 3.1 oz)    Physical Exam   General: NAD   Skin: Warm, dry, intact  Head: Normocephalic, atraumatic, clear, moist mucus  membranes. Neck:  JVP is normal    Lungs:Clear to ausculation bilaterally. No wheezes, rales, or rhonchi. Breathing is unlabored. Cardiovascular: RRR with S1 S2. Soft murmur, no rubs or gallops Abdomen: Soft, non-tender, non-distended with normoactive bowel sounds. No obvious abdominal masses. MSK: Moving extremities Extremities:  No edema.   R arm soft with ecchymosis  Improved from yesterday  Neuro: Alert and oriented. No focal deficits. No facial asymmetry. MAE spontaneously. Psych: Responds to questions appropriately with normal affect.    Labs    Chemistry Recent Labs  Lab 12/04/17 [phone]  12/09/17 0822 12/10/17 0256 12/11/17 0303  NA 137   < > 133* 136 134*  K 5.2*   < > 4.5 4.6 4.5  CL 112*   < > 106 109 108  CO2 18*   < > 17* 20* 20*  GLUCOSE 110*   < > 188* 135* 142*  BUN 33*   < > 42* 37* 34*  CREATININE 1.82*   < > 2.16* 2.15* 2.08*  CALCIUM 7.9*   < > 8.3* 8.1* 7.8*  ALBUMIN 2.8*  --   --   --   --  GFRNONAA 32*   < > 26* 26* 27*  GFRAA 37*   < > 30* 31* 32*  ANIONGAP 7   < > 10 7 6    < > = values in this interval not displayed.     Hematology Recent Labs  Lab 12/09/17 0225 12/10/17 0256 12/11/17 0303  WBC 14.5* 17.3* 19.3*  RBC 2.90* 3.13* 2.90*  HGB 9.4* 10.1* 9.3*  HCT 28.3* 30.8* 29.3*  MCV 97.6 98.4 101.0*  MCH 32.4 32.3 32.1  MCHC 33.2 32.8 31.7  RDW 16.7* 16.7* 16.9*  PLT 181 188 158   BNP No results for input(s): BNP, PROBNP in the last 168 hours.  Urinalysis    Component Value Date/Time   COLORURINE STRAW (A) 12/09/2017 1809   APPEARANCEUR HAZY (A) 12/09/2017 1809   LABSPEC 1.005 12/09/2017 1809   PHURINE 5.0 12/09/2017 1809   GLUCOSEU NEGATIVE 12/09/2017 1809   HGBUR MODERATE (A) 12/09/2017 1809   BILIRUBINUR NEGATIVE 12/09/2017 1809   KETONESUR NEGATIVE 12/09/2017 1809   PROTEINUR NEGATIVE 12/09/2017 1809   NITRITE NEGATIVE 12/09/2017 1809   LEUKOCYTESUR LARGE (A) 12/09/2017 1809     Radiology    No results  found.  Telemetry    12/07/17 NSR/SB HR  - Personally Reviewed  ECG    No new tracings as of  12/07/17- Personally Reviewed  Cardiac Studies   11/29/2017 Emergent Cardiac Cath:    There is mild left ventricular systolic dysfunction.  The left ventricular ejection fraction is 45-50% by visual estimate.  Prox RCA to Mid RCA lesion is 80% stenosed. Mid RCA to Dist RCA lesion is 100% stenosed with 95% stenosed side branch in Post Atrio.  Ost LM to Mid LM lesion is 40% stenosed.  Balloon angioplasty was attempted using a BALLOON SAPPHIRE 2.0X12. -- was unable to cross the 100% stenosis & no reduction in 80% lesion.  Mid Cx to Dist Cx lesion is 95% stenosed (thrombotic) with 55% stenosed side branch in Ost 2nd Mrg. -- LIKELY THE CULPRIT LESION  Ost LAD to Prox LAD lesion is 85% stenosed.   Severe three-vessel disease with 100% CTO mid RCA, severe 95% thrombotic lesion trifurcation lesion of the distal Cx-OM 2-LPL 1 and proximal LAD 85% eccentric lesion.  Unable to cross RCA lesion confirming CTO (PDA fills via collaterals from septal perforators)  Mildly reduced LVEF of roughly 45 to 50% with inferoapical hypokinesis  Since the patient was chest pain-free on medications, and the RCA appears to be chronically occluded with complex trifurcation circumflex lesion as a likely culprit, I felt it more prudent to stop at this point to allow time for further clarification of the best course of action going forward. His CKD would also warrant staged PCI if indicated as opposed to continued contrast use.   ------------------------------------------------------------------- 11/30/2017 ECHO:  - Left ventricle: The cavity size was normal. There was mild focal basal hypertrophy of the septum. Systolic function was mildly reduced. The estimated ejection fraction was in the range of 45% to 50%. There is hypokinesis of the inferior myocardium. There was no evidence of elevated  ventricular filling pressure by Doppler parameters. - Aortic valve: Valve mobility was restricted. There was moderate regurgitation. - Mitral valve: There was mild to moderate regurgitation. - Right ventricle: The cavity size was mildly dilated. Wall thickness was normal   Cath/PCI 12/07/17   LESION #1 & 1.5: Mid Cx to Dist Cx lesion is 95% stenosed with 55% stenosed side branch in Ost 2nd Mrg.  Scoring balloon  angioplasty was performed on the side branch using a BALLOON WOLVERINE 2.50X10.  Post intervention, the side branch was reduced to 20% residual stenosis.  A drug-eluting stent was successfully placed in the main Circumflex using a STENT SYNERGY DES 3X16. -Postdilated to 3.3 mm  Post intervention, there is a 0% residual stenosis.  LESION #2: Prox LAD lesion is 85% stenosed.  A drug-eluting stent was successfully placed using a STENT SYNERGY DES 3X16. - Post-dilated to 3.6 mm  Post intervention, there is a 0% residual stenosis.  _______________________________________________  LV end diastolic pressure is moderately elevated.    Successful DES PCI of mid-dist Cxacross2nd Mrg& AVG Cx -Synergy DES 3.0 mmx16 mm (3.3 mm) with Cutting Balloon PTCA of ostial2nd Mrgusing a 2.5 mm Wolverine balloon.  Successful PCI ofOst LAD to Prox LAD lesion is 85% stenosed.-With a Synergy DES 3.0 mmx16 mm postdilated to 3.24mm   Moderately Elevated LVEDP   Transfer to 6 Central post procedure unit for sheath removal with manual pressure. Monitor renal function and hemoglobin/hematocrit Dual antiplatelet therapy for minimum 1 year      Patient Profile     84 y.o. male whopresented with acute onset exertional dyspnea and diaphoresis and chest discomfort. He was initially noted to be in A. fib with subtle EKG changes, however with recurrence of active chest pain he developed1 to 2 mm inferior elevations as well as some lateral ST elevation.He was taken to Cath Lab  because of these elevations and positive troponin.  Assessment & Plan    1. STEMI: -Patient presented 11/29/2017 with concern for STEMI>>taken emergently to the Cath Lab which revealed severe three-vessel disease with CTO of RCA, severe 95% thrombotic lesion trifurcation lesion of the distal Cx-OM 2-LPL 1 and proximal LAD 85% eccentric lesion. Pt underwent intervention Monday as noted above     Plan to Keep on dual antiplt therapy   2.  New onset atrial fibrillation:  Pt had afib on admit  New for him    He was treated with IV amiodarone and converted   He is now on low dose amiodarone 100 mg. Bradycardic at times but no recurrence of Afib.    Would like to avoid anticoag with anemia   CHADSVASC 6   3.  Chronic kidney disease, stage IV:  Renal following.  Cr improved      4  ID  Urine positive for Gr neg rods  Received Keflex and Ceftriaxone yesterday   Sensitivities pending   WIll give Ceftriaxone today    Await further cult results   Since clinic ally improved prob d/c today     4.  History of BPH: -Foley out Had in during time of interventions due to problems urinating  -   5.  Anemia: Hgg is stable  6.  HTN:  BP control is OK    7. HLD: -CHO-137, HDL-38, LDL-80, Trig-93 -Continue high intensity atorvastatin   D/C today   WBC is trending down    Will arrange close oupt follow up    Oral antibiotics to [facility] on [address]   Dorris Carnes, MD 12/11/2017 6:40 AM Beeper [phone]  PT seen and examined   I agree with findings as noted by R Barrett Pt a little nauseated this AM   No SOB On exam  T m 99   Lungs CTA   Cardiac RRR  No S3   Ext R arm with ecchymoses   Nontendr    Tr LE edema  Labs signif for  WBC 17.4   Was 9.9 2 days ago Hgb is stable   Cr 2.15 Urinewith leukocytes but nitrite negative    DIscussed above with renal   Will give IV ceftriaxone for UTI. Continue other meds  Check CBC and BMET in AM    Elevate R arm and legs when not active    Possible d/c  in am    For questions or updates, please contact   Please consult [email or web address] for contact info under Cardiology/STEMI.

## 2017-12-11 NOTE — Progress Notes (Signed)
Admit: 11/29/2017 LOS: 76  25M AoCKD4 related to CIN at time of LHC for STEMI, needs rpt LHC for PCI  Subjective:  Stable renal function  U Cx with E. Coli, SN pending, on ceftriaxone WBC 16.7 stable, afebrile Feels well  06/13 0701 - 06/14 0700 In: 340 [P.O.:340] Out: 700 [Urine:700]  Filed Weights   12/09/17 0540 12/10/17 0706 12/11/17 0305  Weight: 86.3 kg (190 lb 4.1 oz) 86.4 kg (190 lb 7.6 oz) 89.9 kg (198 lb 3.1 oz)    Scheduled Meds: . allopurinol  100 mg Oral BH-q7a  . amiodarone  100 mg Oral Daily  . aspirin  81 mg Oral Daily  . atorvastatin  80 mg Oral q1800  . clopidogrel  75 mg Oral Daily  . darbepoetin (ARANESP) injection - NON-DIALYSIS  100 mcg Subcutaneous Q Fri-1800  . magnesium oxide  400 mg Oral Daily  . metoprolol succinate  25 mg Oral Daily  . pantoprazole  40 mg Oral Daily  . polyethylene glycol  17 g Oral Daily  . psyllium  1 packet Oral Daily  . sodium chloride flush  3 mL Intravenous Q12H  . sodium chloride flush  3 mL Intravenous Q12H  . terazosin  4 mg Oral QPM  . vitamin B-12  1,000 mcg Oral Daily   Continuous Infusions: . sodium chloride     PRN Meds:.sodium chloride, acetaminophen, docusate sodium, magnesium hydroxide, nitroGLYCERIN, ondansetron (ZOFRAN) IV, simethicone, sodium chloride flush, sodium chloride flush  Current Labs: reviewed    Physical Exam:  Blood pressure (!) 154/64, pulse 61, temperature 98.1 F (36.7 C), temperature source Oral, resp. rate 20, height 5\' 5"  (1.651 m), weight 89.9 kg (198 lb 3.1 oz), SpO2 96 %. NAD RRR CTAB No LEE  A 1. AoCKD4, returned to baseline renal function prior to [facility] but post with slight stable increase in SCr at 2.1 2. CAD, admit with STEMI, not CABG candidate for MV disease, s/p LHC and PCI x2 12/07/17 3. Hyperkalemia, mild, resolved 4. Anemia on ESA 5. Leukocysosis and Pyuria, E. Coli on U Cx, Ceftriaxons/ Sn pending  P 1. Stable renal function at this time 2. Already has f/u appt 7/17  at [facility] with Dr. Posey Pronto 3. No inpatient renal issues 4. Will set up labs from mid next week 5. Daily weights, Daily Renal Panel, Strict I/Os, Avoid nephrotoxins (NSAIDs, judicious IV Contrast)   Pearson Grippe MD 12/11/2017, 1:17 PM  Recent Labs  Lab 12/09/17 0822 12/10/17 0256 12/11/17 0303  NA 133* 136 134*  K 4.5 4.6 4.5  CL 106 109 108  CO2 17* 20* 20*  GLUCOSE 188* 135* 142*  BUN 42* 37* 34*  CREATININE 2.16* 2.15* 2.08*  CALCIUM 8.3* 8.1* 7.8*   Recent Labs  Lab 12/05/17 1638  12/10/17 0256 12/11/17 0303 12/11/17 1017  WBC 7.4   < > 17.3* 19.3* 16.7*  NEUTROABS 4.9  --   --   --   --   HGB 8.7*   < > 10.1* 9.3* 9.7*  HCT 26.1*   < > 30.8* 29.3* 29.4*  MCV 102.4*   < > 98.4 101.0* 100.7*  PLT 171   < > 188 158 173   < > = values in this interval not displayed.

## 2017-12-12 ENCOUNTER — Encounter (HOSPITAL_COMMUNITY): Payer: Self-pay | Admitting: Emergency Medicine

## 2017-12-12 ENCOUNTER — Other Ambulatory Visit: Payer: Self-pay

## 2017-12-12 ENCOUNTER — Telehealth: Payer: Self-pay | Admitting: Cardiology

## 2017-12-12 ENCOUNTER — Emergency Department (HOSPITAL_COMMUNITY)
Admission: EM | Admit: 2017-12-12 | Discharge: 2017-12-12 | Disposition: A | Discharge status: Home / Self Care | Payer: Medicare Other | Attending: Emergency Medicine | Admitting: Emergency Medicine

## 2017-12-12 ENCOUNTER — Telehealth: Payer: Self-pay | Admitting: Internal Medicine

## 2017-12-12 DIAGNOSIS — R339 Retention of urine, unspecified: Secondary | ICD-10-CM

## 2017-12-12 DIAGNOSIS — Z7902 Long term (current) use of antithrombotics/antiplatelets: Secondary | ICD-10-CM | POA: Insufficient documentation

## 2017-12-12 DIAGNOSIS — I252 Old myocardial infarction: Secondary | ICD-10-CM | POA: Diagnosis not present

## 2017-12-12 DIAGNOSIS — Z8673 Personal history of transient ischemic attack (TIA), and cerebral infarction without residual deficits: Secondary | ICD-10-CM | POA: Diagnosis not present

## 2017-12-12 DIAGNOSIS — E7800 Pure hypercholesterolemia, unspecified: Secondary | ICD-10-CM | POA: Diagnosis not present

## 2017-12-12 DIAGNOSIS — Z7982 Long term (current) use of aspirin: Secondary | ICD-10-CM | POA: Diagnosis not present

## 2017-12-12 DIAGNOSIS — Z87891 Personal history of nicotine dependence: Secondary | ICD-10-CM | POA: Diagnosis not present

## 2017-12-12 DIAGNOSIS — I10 Essential (primary) hypertension: Secondary | ICD-10-CM | POA: Diagnosis not present

## 2017-12-12 DIAGNOSIS — Z79899 Other long term (current) drug therapy: Secondary | ICD-10-CM | POA: Diagnosis not present

## 2017-12-12 LAB — BASIC METABOLIC PANEL
Anion gap: 8 (ref 5–15)
BUN: 28 mg/dL — AB (ref 6–20)
CO2: 20 mmol/L — ABNORMAL LOW (ref 22–32)
CREATININE: 1.93 mg/dL — AB (ref 0.61–1.24)
Calcium: 8.3 mg/dL — ABNORMAL LOW (ref 8.9–10.3)
Chloride: 107 mmol/L (ref 101–111)
GFR calc Af Amer: 35 mL/min — ABNORMAL LOW (ref >60)
GFR, EST NON AFRICAN AMERICAN: 30 mL/min — AB (ref >60)
Glucose, Bld: 172 mg/dL — ABNORMAL HIGH (ref 65–99)
Potassium: 4.2 mmol/L (ref 3.5–5.1)
Sodium: 135 mmol/L (ref 135–145)

## 2017-12-12 LAB — URINE CULTURE: Culture: >=100000 — AB

## 2017-12-12 LAB — CBC
HCT: 30.4 % — ABNORMAL LOW (ref 39.0–52.0)
Hemoglobin: 9.7 g/dL — ABNORMAL LOW (ref 13.0–17.0)
MCH: 32.7 pg (ref 26.0–34.0)
MCHC: 31.9 g/dL (ref 30.0–36.0)
MCV: 102.4 fL — AB (ref 78.0–100.0)
PLATELETS: 176 10*3/uL (ref 150–400)
RBC: 2.97 MIL/uL — ABNORMAL LOW (ref 4.22–5.81)
RDW: 16.8 % — AB (ref 11.5–15.5)
WBC: 11.6 10*3/uL — ABNORMAL HIGH (ref 4.0–10.5)

## 2017-12-12 LAB — URINALYSIS, ROUTINE W REFLEX MICROSCOPIC
BILIRUBIN URINE: NEGATIVE
Glucose, UA: NEGATIVE mg/dL
HGB URINE DIPSTICK: NEGATIVE
KETONES UR: NEGATIVE mg/dL
NITRITE: NEGATIVE
PROTEIN: NEGATIVE mg/dL
Specific Gravity, Urine: 1.01 (ref 1.005–1.030)
pH: 5 (ref 5.0–8.0)

## 2017-12-12 NOTE — Discharge Instructions (Signed)
Continue taking your antibiotic and prostate medication.

## 2017-12-12 NOTE — ED Provider Notes (Signed)
[facility] EMERGENCY DEPARTMENT Provider Note   CSN: [id number] Arrival date & time: 12/12/17  1010     History   Chief Complaint Chief Complaint  Patient presents with  . Urine Retention    HPI Paul Kramer is a 84 y.o. male.  Patient is an 84 year old male with a history of recent MI status post catheterization and discharged from the hospital yesterday, AAA, chronic kidney disease who is presenting today with complaints of urinary retention.  Patient states that after catheterization one night he started running a fever and he was found to have a urinary tract infection.  He also required a Foley catheter.  He had been is receiving Rocephin his last dose was less than 24 hours ago and the catheter was removed yesterday.  Yesterday morning he was able to void but since being home he is last voided around 3 PM yesterday.  Patient states he feels full and distended and needs to urinate but cannot.  He states he has a burning sensation in his penis and can only get 1 to 2 drops of urine out.  He denies any fever, nausea, vomiting or chills.  He has not noticed any blood in the urine.  He is currently taking Flomax.  And he was picking up his antibiotic today to continue taking for urinary tract infection which was caused by E. coli which was sensitive to Keflex.  The history is provided by the patient.    Past Medical History:  Diagnosis Date  . AAA (abdominal aortic aneurysm) ([place])   . Hypercholesteremia   . Hypertension   . Myocardial infarction [facility])    Per OLD EKG per son  . Narcolepsy    pt may pass out when laughing  . Spinal stenosis   . TIA (transient ischemic attack)     Patient Active Problem List   Diagnosis Date Noted  . Pressure injury of skin 12/06/2017  . AKI (acute kidney injury) ([place])   . Elevated PSA   . Status post coronary artery stent placement   . Macrocytosis   . Acute ST elevation myocardial infarction (STEMI) ([place]) 11/29/2017  .  Essential hypertension 11/29/2017  . Hyperlipidemia with target low density lipoprotein (LDL) cholesterol less than 70 mg/dL 11/29/2017  . New onset a-fib ([place]) - with RVR 11/29/2017  . CKD (chronic kidney disease) stage 3, GFR 30-59 ml/min ([place]) 11/29/2017    Past Surgical History:  Procedure Laterality Date  . CORONARY BALLOON ANGIOPLASTY N/A 11/29/2017   Procedure: CORONARY BALLOON ANGIOPLASTY;  Surgeon: Leonie Man, MD;  Location: [facility] CV LAB;  Service: Cardiovascular;  Laterality: N/A;  . CORONARY STENT INTERVENTION N/A 12/07/2017   Procedure: CORONARY STENT INTERVENTION;  Surgeon: Leonie Man, MD;  Location: [place] CV LAB;  Service: Cardiovascular;  Laterality: N/A;  . LEFT HEART CATH N/A 12/07/2017   Procedure: Left Heart Cath;  Surgeon: Leonie Man, MD;  Location: [facility] CV LAB;  Service: Cardiovascular;  Laterality: N/A;  . LEFT HEART CATH AND CORONARY ANGIOGRAPHY N/A 11/29/2017   Procedure: LEFT HEART CATH AND CORONARY ANGIOGRAPHY;  Surgeon: Leonie Man, MD;  Location: [facility];  Service: Cardiovascular;  Laterality: N/A;  . LIPOMA EXCISION    . SPINAL FIXATION SURGERY          Home Medications    Prior to Admission medications   Medication Sig Start Date End Date Taking? Authorizing Provider  allopurinol (ZYLOPRIM) 100 MG tablet Take 100  mg by mouth every morning. 10/08/17   [provider]  amiodarone (PACERONE) 100 MG tablet Take 1 tablet (100 mg total) by mouth daily. 12/11/17   Leanor Kail, PA  aspirin EC 81 MG tablet Take 81 mg by mouth daily.    [provider]  atorvastatin (LIPITOR) 80 MG tablet Take 1 tablet (80 mg total) by mouth daily at 6 PM. 12/11/17   Bhagat, Bhavinkumar, PA  clopidogrel (PLAVIX) 75 MG tablet Take 1 tablet (75 mg total) by mouth daily. 12/11/17   Bhagat, Crista Luria, PA  cyanocobalamin 1000 MCG tablet Take 1 tablet (1,000 mcg total) by mouth daily. 12/11/17   Bhagat, Crista Luria, PA   magnesium oxide (MAG-OX) 400 (241.3 Mg) MG tablet Take 1 tablet (400 mg total) by mouth daily. 12/11/17   Bhagat, Crista Luria, PA  Multiple Vitamins-Minerals (CENTRUM SILVER PO) Take 1 tablet by mouth daily.    [provider]  nitroGLYCERIN (NITROSTAT) 0.4 MG SL tablet Place 1 tablet (0.4 mg total) under the tongue every 5 (five) minutes x 3 doses as needed for chest pain. 12/11/17   Bhagat, Crista Luria, PA  terazosin (HYTRIN) 2 MG capsule Take 4 mg by mouth every evening. 10/08/17   [provider]    Family History No family history on file.  Social History Social History   Tobacco Use  . Smoking status: Former Research scientist (life sciences)  . Smokeless tobacco: Never Used  Substance Use Topics  . Alcohol use: Yes    Comment: rare  . Drug use: Never     Allergies   Other; Contrast media [iodinated diagnostic agents]; and Morphine and related   Review of Systems Review of Systems  All other systems reviewed and are negative.    Physical Exam Updated Vital Signs BP (!) 115/51 (BP Location: Left Arm)   Pulse 62   Temp 98.6 F (37 C) (Oral)   Resp 18   Ht 5\' 6"  (1.676 m)   Wt 85.7 kg (189 lb)   SpO2 96%   BMI 30.51 kg/m   Physical Exam  Constitutional: He is oriented to person, place, and time. He appears well-developed and well-nourished. No distress.  HENT:  Head: Normocephalic and atraumatic.  Mouth/Throat: Oropharynx is clear and moist.  Eyes: Pupils are equal, round, and reactive to light. Conjunctivae and EOM are normal.  Neck: Normal range of motion. Neck supple.  Cardiovascular: Normal rate, regular rhythm and intact distal pulses.  No murmur heard. Pulmonary/Chest: Effort normal and breath sounds normal. No respiratory distress. He has no wheezes. He has no rales.  Abdominal: Soft. He exhibits no distension. There is tenderness in the suprapubic area. There is no rebound, no guarding and no CVA tenderness.  Musculoskeletal: Normal range of motion. He exhibits  no edema or tenderness.  Neurological: He is alert and oriented to person, place, and time.  Skin: Skin is warm and dry. No rash noted. No erythema.  Psychiatric: He has a normal mood and affect. His behavior is normal.  Nursing note and vitals reviewed.    ED Treatments / Results  Labs (all labs ordered are listed, but only abnormal results are displayed) Labs Reviewed  BASIC METABOLIC PANEL - Abnormal; Notable for the following components:      Result Value   CO2 20 (*)    Glucose, Bld 172 (*)    BUN 28 (*)    Creatinine, Ser 1.93 (*)    Calcium 8.3 (*)    GFR calc non Af Amer 30 (*)  GFR calc Af Amer 35 (*)    All other components within normal limits  CBC - Abnormal; Notable for the following components:   WBC 11.6 (*)    RBC 2.97 (*)    Hemoglobin 9.7 (*)    HCT 30.4 (*)    MCV 102.4 (*)    RDW 16.8 (*)    All other components within normal limits  URINALYSIS, ROUTINE W REFLEX MICROSCOPIC    EKG None  Radiology No results found.  Procedures Procedures (including critical care time)  Medications Ordered in ED Medications - No data to display   Initial Impression / Assessment and Plan / ED Course  I have reviewed the triage vital signs and the nursing notes.  Pertinent labs & imaging results that were available during my care of the patient were reviewed by me and considered in my medical decision making (see chart for details).     Patient presenting today with urinary retention.  He is otherwise well-appearing and has no other complaints.  Patient recently discharged yesterday after having cardiac catheterization as well as being found to have an E. coli Keflex sensitive UTI.  Patient had a Foley catheter and it was removed yesterday.  He initially was able to void and then developed retention around 3 PM yesterday.  We will place a Foley catheter and have patient continue Keflex at home and follow-up with urology for catheter removal.  He does have a  history of BPH and is currently on Flomax.  12:15 PM Patient's symptoms resolved after catheter placement.  Patient had labs drawn prior to being seen which shows improvement of his creatinine and otherwise no acute findings.  Will have patient follow-up with urology in 1 week for catheter removal.  Final Clinical Impressions(s) / ED Diagnoses   Final diagnoses:  Urinary retention    ED Discharge Orders    None       Blanchie Dessert, MD 12/12/17 1215

## 2017-12-12 NOTE — Telephone Encounter (Signed)
Pt's daughter called in reporting her father has not been able to urinate since the time of discharge yesterday afternoon around 3pm. Stated cardiology was planning to follow up regarding urine cultures ans send in antibiotics. I was able to reach Dr. Harrington Challenger regarding this, and antibiotics were sent into his pharmacy. In calling back to speak with the daughter she reports they decided to bring him back to the ED given his urinary retention. Has hx of the same requiring a foley in the past and seeing urology. He had received his terazosin since he was discharged. I agreed with their decision. She thanked me for the follow up call.   Reino Bellis NP

## 2017-12-12 NOTE — ED Notes (Signed)
Pt alert and oriented, stable. Discharge instructions reviewed with pt and cg.

## 2017-12-12 NOTE — Telephone Encounter (Signed)
Called in rx for Keflex 250 q 8 hours   8 days  For UTI Called to [facility] Left VM for family

## 2017-12-12 NOTE — ED Triage Notes (Signed)
Patient to ED c/o urine retention since 3pm yesterday - was recently admitted for cardiac cath on Monday (hx renal insufficiency prior to that) and had Foley placed afterward, treated for subsequent UTI, and then d/c yesterday. Reports abdominal pressure and discomfort. Denies fevers/chills.

## 2017-12-16 ENCOUNTER — Encounter: Payer: Self-pay | Admitting: Cardiology

## 2017-12-16 DIAGNOSIS — D649 Anemia, unspecified: Secondary | ICD-10-CM | POA: Insufficient documentation

## 2017-12-16 DIAGNOSIS — N390 Urinary tract infection, site not specified: Secondary | ICD-10-CM | POA: Insufficient documentation

## 2017-12-16 DIAGNOSIS — I2510 Atherosclerotic heart disease of native coronary artery without angina pectoris: Secondary | ICD-10-CM | POA: Insufficient documentation

## 2017-12-16 DIAGNOSIS — R339 Retention of urine, unspecified: Secondary | ICD-10-CM | POA: Insufficient documentation

## 2017-12-16 DIAGNOSIS — N400 Enlarged prostate without lower urinary tract symptoms: Secondary | ICD-10-CM | POA: Insufficient documentation

## 2017-12-16 DIAGNOSIS — Z9861 Coronary angioplasty status: Secondary | ICD-10-CM

## 2017-12-16 DIAGNOSIS — I255 Ischemic cardiomyopathy: Secondary | ICD-10-CM | POA: Insufficient documentation

## 2017-12-17 ENCOUNTER — Ambulatory Visit: Payer: Medicare Other | Admitting: Physician Assistant

## 2017-12-17 DIAGNOSIS — D631 Anemia in chronic kidney disease: Secondary | ICD-10-CM | POA: Diagnosis not present

## 2017-12-17 DIAGNOSIS — N184 Chronic kidney disease, stage 4 (severe): Secondary | ICD-10-CM | POA: Diagnosis not present

## 2017-12-18 ENCOUNTER — Encounter: Payer: Self-pay | Admitting: Cardiology

## 2017-12-18 ENCOUNTER — Ambulatory Visit (INDEPENDENT_AMBULATORY_CARE_PROVIDER_SITE_OTHER): Payer: Medicare Other | Admitting: Cardiology

## 2017-12-18 VITALS — BP 106/50 | HR 61 | Ht 66.0 in | Wt 185.0 lb

## 2017-12-18 DIAGNOSIS — Z8673 Personal history of transient ischemic attack (TIA), and cerebral infarction without residual deficits: Secondary | ICD-10-CM | POA: Diagnosis not present

## 2017-12-18 DIAGNOSIS — I4891 Unspecified atrial fibrillation: Secondary | ICD-10-CM | POA: Diagnosis not present

## 2017-12-18 DIAGNOSIS — D631 Anemia in chronic kidney disease: Secondary | ICD-10-CM

## 2017-12-18 DIAGNOSIS — Z9861 Coronary angioplasty status: Secondary | ICD-10-CM

## 2017-12-18 DIAGNOSIS — N183 Chronic kidney disease, stage 3 unspecified: Secondary | ICD-10-CM

## 2017-12-18 DIAGNOSIS — I10 Essential (primary) hypertension: Secondary | ICD-10-CM

## 2017-12-18 DIAGNOSIS — R3914 Feeling of incomplete bladder emptying: Secondary | ICD-10-CM | POA: Diagnosis not present

## 2017-12-18 DIAGNOSIS — R350 Frequency of micturition: Secondary | ICD-10-CM | POA: Diagnosis not present

## 2017-12-18 DIAGNOSIS — R351 Nocturia: Secondary | ICD-10-CM | POA: Diagnosis not present

## 2017-12-18 DIAGNOSIS — R339 Retention of urine, unspecified: Secondary | ICD-10-CM

## 2017-12-18 DIAGNOSIS — I2510 Atherosclerotic heart disease of native coronary artery without angina pectoris: Secondary | ICD-10-CM

## 2017-12-18 DIAGNOSIS — I255 Ischemic cardiomyopathy: Secondary | ICD-10-CM | POA: Diagnosis not present

## 2017-12-18 MED ORDER — PANTOPRAZOLE SODIUM 40 MG PO TBEC: 40.0000 mg | DELAYED_RELEASE_TABLET | Freq: Every day | ORAL | 3 refills | Status: DC

## 2017-12-18 NOTE — Assessment & Plan Note (Addendum)
Post PCI Rt radial hematoma and baseline chronic anemia on admission. He was transfused before discharge. Not sent home on NOAC till this could be followed up.

## 2017-12-18 NOTE — Assessment & Plan Note (Signed)
3V CAD at cath 11/29/17. CABG considered but pt declined and CVTS did not feel he was a good candidate. The pt had LAD and CFX PCI with DES 12/07/17.

## 2017-12-18 NOTE — Assessment & Plan Note (Signed)
Hx of HTN- on ACE and Cardura pre MI-now on Cardura 2 mg Q HS only-B/P running 373 systolic

## 2017-12-18 NOTE — Assessment & Plan Note (Signed)
EF 45%-50% by echo 11/30/17

## 2017-12-18 NOTE — Assessment & Plan Note (Signed)
Pt presented 11/29/17 with chest pain and dyspnea- had dynamic EKG changes in the ED and was taken to the cath lab

## 2017-12-18 NOTE — Assessment & Plan Note (Signed)
Back to the ED with urinary retention 12/12/17- foley placed. Removed this morning by urology, pt has voided 3 times today

## 2017-12-18 NOTE — Assessment & Plan Note (Signed)
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

## 2017-12-18 NOTE — Progress Notes (Signed)
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 His son in law Paul Kramer is an orthopedic PA with [employer]. The patient has chronic renal insufficiency and has been followed by Dr Felipa Eth. His baseline creatinine has been running in the range of 1.6 - 1.8. Other medical problems included BPH, remote stroke as an incidental finding on past scan, and chronic anemia.   He presented to [facility] 11/29/17 with complaints of DOE that had been present for a couple of days. In the ED he was noted to be in AF with RVR which was new for him. He was unaware of his AF. He also had chest pain with dynamic EKG changes. He was taken to the cath lab where cath revealed CTO of his RCA with L-R collaterals from the LAD, 40% LM, 80% proximal LAD and a complex distal CFX bifurcation stenosis which we felt was the culprit vessel. PCI of the CFX was attempted but unsuccessful. The pt was considered for CABG but ultimately he declined and the surgeons agreed he was not a good candidate for CABG. His EF was 45-50% by echo (while in AF). His Troponin peaked at 9. The plan was to plan a staged PCI. His AF converted while on PO Amiodarone.   His post MI course was complicated by acute on chronic renal insufficiency and anemia which was multifactorial. He had a Rt arm hematoma and a positive stool for FOB. He was transfused and left on ASA and Plavix but it was decided to not anticoagulate him. He eventually went back to the cath lab 12/07/17 and underwent intervention to his LAD and CFX with a good final result. Post PCI he had some issues with urinary retention but this morning he had his foley removed at the urologist office and he has voided three times so far today. He had labs done at Dr Serita Grit office yesterday and we'll try and get those  results. Overall the patient says he feels "great". He has been up walking around, no chest pain or tachycardia.   Current Outpatient Medications  Medication Sig Dispense Refill  . allopurinol (ZYLOPRIM) 100 MG tablet Take 100 mg by mouth every morning.    Marland Kitchen amiodarone (PACERONE) 100 MG tablet Take 1 tablet (100 mg total) by mouth daily. 90 tablet 3  . aspirin EC 81 MG tablet Take 81 mg by mouth daily.    Marland Kitchen atorvastatin (LIPITOR) 80 MG tablet Take 1 tablet (80 mg total) by mouth daily at 6 PM. 90 tablet 3  . cephALEXin (KEFLEX) 250 MG capsule Take 250 mg by mouth 3 (three) times daily.    . clopidogrel (PLAVIX) 75 MG tablet Take 1 tablet (75 mg total) by mouth daily. 90 tablet 3  . cyanocobalamin 1000 MCG tablet Take 1 tablet (1,000 mcg total) by mouth daily. 90 tablet 3  . magnesium oxide (MAG-OX) 400 (241.3 Mg) MG tablet Take 1 tablet (400 mg total) by mouth daily. 30 tablet 1  . Multiple Vitamins-Minerals (CENTRUM SILVER PO) Take 1 tablet by mouth daily.    . nitroGLYCERIN (NITROSTAT) 0.4 MG SL tablet Place 1 tablet (0.4 mg total) under the tongue every 5 (five) minutes x 3 doses as needed for chest pain. 25 tablet 3  . Probiotic Product (PROBIOTIC DAILY PO) Take 1 tablet by  mouth daily.    . Psyllium (METAMUCIL FIBER PO) Take by mouth.    . terazosin (HYTRIN) 2 MG capsule Take 4 mg by mouth every evening.    . pantoprazole (PROTONIX) 40 MG tablet Take 1 tablet (40 mg total) by mouth daily. 90 tablet 3   No current facility-administered medications for this visit.     Allergies  Allergen Reactions  . Other Shortness Of Breath    Esters  . Contrast Media [Iodinated Diagnostic Agents] Other (See Comments)    Just feel bad all over  . Morphine And Related Other (See Comments)    Patient states he does not like to take Morphine as it gives him nightmares and hallucinations.     Past Medical History:  Diagnosis Date  . AAA (abdominal aortic aneurysm) ([place])   . Hypercholesteremia     . Hypertension   . Myocardial infarction [facility])    Per OLD EKG per son  . Narcolepsy    pt may pass out when laughing  . Spinal stenosis   . TIA (transient ischemic attack)     Social History   Socioeconomic History  . Marital status: Unknown    Spouse name: Not on file  . Number of children: Not on file  . Years of education: Not on file  . Highest education level: Not on file  Occupational History  . Not on file  Social Needs  . Financial resource strain: Not on file  . Food insecurity:    Worry: Not on file    Inability: Not on file  . Transportation needs:    Medical: Not on file    Non-medical: Not on file  Tobacco Use  . Smoking status: Former Research scientist (life sciences)  . Smokeless tobacco: Never Used  Substance and Sexual Activity  . Alcohol use: Yes    Comment: rare  . Drug use: Never  . Sexual activity: Not on file  Lifestyle  . Physical activity:    Days per week: Not on file    Minutes per session: Not on file  . Stress: Not on file  Relationships  . Social connections:    Talks on phone: Not on file    Gets together: Not on file    Attends religious service: Not on file    Active member of club or organization: Not on file    Attends meetings of clubs or organizations: Not on file    Relationship status: Not on file  . Intimate partner violence:    Fear of current or ex partner: Not on file    Emotionally abused: Not on file    Physically abused: Not on file    Forced sexual activity: Not on file  Other Topics Concern  . Not on file  Social History Narrative  . Not on file    Family History-unremarkable for early CAD    Review of Systems: General: negative for chills, fever, night sweats or weight changes.  Cardiovascular: negative for chest pain, dyspnea on exertion, edema, orthopnea, palpitations, paroxysmal nocturnal dyspnea or shortness of breath Dermatological: negative for rash Respiratory: negative for cough or wheezing Urologic: negative for  hematuria Abdominal: negative for nausea, vomiting, diarrhea, bright red blood per rectum, melena, or hematemesis Neurologic: negative for visual changes, syncope, or dizziness All other systems reviewed and are otherwise negative except as noted above.    Blood pressure (!) 106/50, pulse 61, height 5\' 6"  (1.676 m), weight 185 lb (83.9 kg).  General appearance: alert, cooperative,  appears stated age, no distress and pale Neck: no carotid bruit and no JVD Lungs: basilar crackles Lt base Heart: regular rate and rhythm Extremities: resolving ecchymosis Rt forearm Skin: cool, pale, dry Neurologic: Grossly normal  EKG NSR-septal Qs, QTc 525  ASSESSMENT AND PLAN:   Acute ST elevation myocardial infarction (STEMI) ([place]) Pt presented 11/29/17 with chest pain and dyspnea- had dynamic EKG changes in the ED and was taken to the cath lab  CAD S/P percutaneous coronary angioplasty 3V CAD at cath 11/29/17. CABG considered but pt declined and CVTS did not feel he was a good candidate. The pt had LAD and CFX PCI with DES 12/07/17.  CKD (chronic kidney disease) stage 3, GFR 30-59 ml/min (HCC) Pt had acute on chronic renal insufficiency post PCI  Ischemic cardiomyopathy EF 45%-50% by echo 11/30/17  Urinary retention Back to the ED with urinary retention 12/12/17- foley placed. Removed this morning by urology, pt has voided 3 times today  Essential hypertension Hx of HTN- on ACE and Cardura pre MI-now on Cardura 2 mg Q HS only-B/P running 161 systolic  Anemia Post PCI Rt radial hematoma and baseline chronic anemia on admission. He was transfused before discharge. Not sent home on NOAC till this could be followed up.   New onset a-fib [facility]) - with RVR New AF on admission 11/29/17. Converted on Amiodarone, then doses cut back secondary to bradycardia.  CHADS VASC=6, not an anticoagulation candidate secondary to anemia requiring transfusion. Pt di have a stool that was positive FOB- will add  PPI  History of stroke Basilar stroke seen on past scan as an incidental finding   PLAN  I did not change Paul Kramer medications. I'll discuss NOAC Rx with Dr Ellyn Hack, I'd like to see his recent labs first, we have requested them. I did add Protonix to his current medications.  I encouraged him to ambulate, he has Lt basilar atelectasis on exam. I would like Dr Ellyn Hack to see him in a few weeks and this was arranged.   Kerin Ransom PA-C 12/18/2017 4:39 PM

## 2017-12-18 NOTE — Assessment & Plan Note (Signed)
Pt had acute on chronic renal insufficiency post PCI

## 2017-12-18 NOTE — Assessment & Plan Note (Signed)
New AF on admission 11/29/17. Converted on Amiodarone, then doses cut back secondary to bradycardia.  CHADS VASC=6, not an anticoagulation candidate secondary to anemia requiring transfusion. Pt di have a stool that was positive FOB- will add PPI

## 2017-12-18 NOTE — Patient Instructions (Addendum)
Medication Instructions: START Pantoprazole 40 mg daily  If you need a refill on your cardiac medications before your next appointment, please call your pharmacy.    Follow-Up: Your physician wants you to follow-up on July 26th at 8 am with Dr. Ellyn Hack.   Thank you for choosing Heartcare at [facility]!!

## 2017-12-21 ENCOUNTER — Telehealth: Payer: Self-pay | Admitting: Cardiology

## 2017-12-21 ENCOUNTER — Telehealth (HOSPITAL_COMMUNITY): Payer: Self-pay

## 2017-12-21 NOTE — Telephone Encounter (Signed)
Called patient to see if he is interested in the Cardiac Rehab Program. Patient stated he is interested. Explained scheduling process to patient and went over insurance, patient verbalized understanding. Will contact patient for scheduling upon review by the RN Navigator.

## 2017-12-21 NOTE — Telephone Encounter (Signed)
Reviewed pt's labs with Dr Ellyn Hack- Hgb up to 10.3 and SCr down to 1.76. For now no anticoagulation. I discussed this with the patient and at his request his daughter Vinnie Level.  Kerin Ransom PA-C 12/21/2017 4:17 PM

## 2018-01-13 ENCOUNTER — Ambulatory Visit: Payer: Medicare Other | Admitting: Cardiology

## 2018-01-13 DIAGNOSIS — D631 Anemia in chronic kidney disease: Secondary | ICD-10-CM | POA: Diagnosis not present

## 2018-01-13 DIAGNOSIS — N184 Chronic kidney disease, stage 4 (severe): Secondary | ICD-10-CM | POA: Diagnosis not present

## 2018-01-13 DIAGNOSIS — I129 Hypertensive chronic kidney disease with stage 1 through stage 4 chronic kidney disease, or unspecified chronic kidney disease: Secondary | ICD-10-CM | POA: Diagnosis not present

## 2018-01-13 DIAGNOSIS — N2581 Secondary hyperparathyroidism of renal origin: Secondary | ICD-10-CM | POA: Diagnosis not present

## 2018-01-19 ENCOUNTER — Telehealth: Payer: Self-pay | Admitting: Cardiology

## 2018-01-19 NOTE — Telephone Encounter (Signed)
Received records from [facility] on 01/19/18, Appt 01/22/18 @ 8:00AM. [place]

## 2018-01-22 ENCOUNTER — Ambulatory Visit (INDEPENDENT_AMBULATORY_CARE_PROVIDER_SITE_OTHER): Payer: Medicare Other | Admitting: Cardiology

## 2018-01-22 ENCOUNTER — Encounter: Payer: Self-pay | Admitting: Cardiology

## 2018-01-22 VITALS — BP 96/52 | HR 56 | Ht 66.0 in | Wt 180.6 lb

## 2018-01-22 DIAGNOSIS — I255 Ischemic cardiomyopathy: Secondary | ICD-10-CM | POA: Diagnosis not present

## 2018-01-22 DIAGNOSIS — Z955 Presence of coronary angioplasty implant and graft: Secondary | ICD-10-CM | POA: Diagnosis not present

## 2018-01-22 DIAGNOSIS — Z9861 Coronary angioplasty status: Secondary | ICD-10-CM

## 2018-01-22 DIAGNOSIS — I480 Paroxysmal atrial fibrillation: Secondary | ICD-10-CM | POA: Diagnosis not present

## 2018-01-22 DIAGNOSIS — I10 Essential (primary) hypertension: Secondary | ICD-10-CM

## 2018-01-22 DIAGNOSIS — E785 Hyperlipidemia, unspecified: Secondary | ICD-10-CM | POA: Diagnosis not present

## 2018-01-22 DIAGNOSIS — I2510 Atherosclerotic heart disease of native coronary artery without angina pectoris: Secondary | ICD-10-CM | POA: Diagnosis not present

## 2018-01-22 MED ORDER — AMIODARONE HCL 100 MG PO TABS: 100.0000 mg | ORAL_TABLET | Freq: Every day | ORAL | 3 refills | Status: DC

## 2018-01-22 MED ORDER — PANTOPRAZOLE SODIUM 40 MG PO TBEC: 40.0000 mg | DELAYED_RELEASE_TABLET | Freq: Every day | ORAL | 3 refills | Status: DC

## 2018-01-22 MED ORDER — CLOPIDOGREL BISULFATE 75 MG PO TABS: 75.0000 mg | ORAL_TABLET | Freq: Every day | ORAL | 3 refills | Status: DC

## 2018-01-22 MED ORDER — ATORVASTATIN CALCIUM 40 MG PO TABS: 40.0000 mg | ORAL_TABLET | Freq: Every day | ORAL | 3 refills | Status: DC

## 2018-01-22 NOTE — Patient Instructions (Signed)
Medication Instructions:  Decrease atorvastatin (Lipitor) to 40 mg daily Take amiodarone at dinner time  Labwork: At [facility] sure they check a CBC, TSH, CMET, Lipid panel  Follow-Up: Late November/Early December with Dr. Ellyn Hack  Any Other Special Instructions Will Be Listed Below (If Applicable).     If you need a refill on your cardiac medications before your next appointment, please call your pharmacy.

## 2018-01-22 NOTE — Progress Notes (Signed)
PCP: Paul Manes, MD  Clinic Note: Chief Complaint  Patient presents with  . Follow-up    a little dizzy.  . Coronary Artery Disease    MI - s/p 2 V PCI - no further angina  . Cardiomyopathy    (Ischemic) no CHF Sx    HPI: Paul Kramer is a 84 y.o. 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   His son-in-law, Paul Kramer, is an orthopedic PA with [employer].  He does have chronic renal insufficiency followed by Dr. Felipa Eth.  Baseline creatinine is somewhere between 1.6-1.8.  Recent Hospitalizations:   November 29, 2017: Admitted with inferolateral ST elevations in new onset A. fib.    Cardiac cath revealed multivessel CAD however they decided he did not want to undergo CABG.   Converted to NSR on Amio.  He underwent staged PCI on June 10 after allowing time for him to recover from his initial MI, and contrast-induced nephropathy as well as large hematoma.  Paul Kramer was last seen on December 18, 2017 by Kerin Ransom, PA for hospital follow-up. He stated that he - 'FELT GREAT' - up walking around. No CP or tachycardia.   Studies Personally Reviewed - (if available, images/films reviewed: From Epic Chart or Care Everywhere)  Cardiac Cath 11/29/2017: 3V CAD - 100% CTO mRCA (RPDA fills via collaterals - RPAV 95%). LM ~40%. Mid-dCx 95% (55% ost OM2) - Culprit. Ost-pLAD 85%.  2D Echo November 30, 2017: EF 45-50%.  Inferior hypokinesis.  Elevated filling pressures.  Mild to moderate MR.   PCI Cx & LAD - 12/07/2017  m-dCx - Synergy DES 3 x 16 (3.3 mm), PTCA ostOM2 (-20%).  pLAD 95% - Synergy DES 3 x 16 (3.6 mm) Diagnostic Diagram     Post-Intervention Diagram            Interval History: Patient presents today overall doing better.  He has not had to have any further EPO shots for his anemia.  He had one episode where he had some dull discomfort in his  chest while lying down.  This is not associated with exertion.  He is now at this point where he is walking just over half a mile twice a day and doing fairly well with it.  He is not noticing any resting or exertional chest tightness or pressure.  He may get short of breath if he walks fast, but has not had any further angina type symptoms.  He has not had any CHF symptoms of PND, orthopnea but does have some mild edema.  No sensation of recurrent A. fib symptoms rapid irregular heartbeats palpitations. Some orthostatic dizziness, but for the most part no syncope or near syncope, TIA or amaurosis fugax symptoms.  No major bleeding issues, just some minor bruising issues on aspirin and Plavix.  No claudication.  ROS: A comprehensive was performed. Review of Systems  Constitutional: Positive for malaise/fatigue (slowly getting energy back). Negative for weight loss.  HENT: Negative for congestion and nosebleeds.   Respiratory: Negative for cough, shortness of breath and wheezing.   Cardiovascular: Negative for palpitations.  Gastrointestinal: Negative for abdominal pain, blood in stool, constipation, heartburn and melena.  Genitourinary: Positive for frequency (nocturia). Negative for hematuria.       Still having voiding issues - followed by Urology  Musculoskeletal: Negative for falls and joint pain.  A little unsteady gait   Neurological: Positive for dizziness (positional).  Psychiatric/Behavioral: Negative for memory loss. The patient does not have insomnia (nocturia).   All other systems reviewed and are negative.  I have reviewed and (if needed) personally updated the patient's problem list, medications, allergies, past medical and surgical history, social and family history.   Past Medical History:  Diagnosis Date  . AAA (abdominal aortic aneurysm) ([place])   . Hypercholesteremia   . Hypertension   . Myocardial infarction of inferolateral wall ([place]) 11/2017   ? prior Infarct; -  Cath 11/29/17 - 3 V CAD (CTO RCA), 95% m-d Cx (@ OM2 55%)-> PCI Cx-PTCA Om2; 85% p-m LAD => DES PCI   . Narcolepsy    pt may pass out when laughing  . Spinal stenosis   . TIA (transient ischemic attack)     Past Surgical History:  Procedure Laterality Date  . CORONARY BALLOON ANGIOPLASTY N/A 11/29/2017   Procedure: CORONARY BALLOON ANGIOPLASTY;  Surgeon: Leonie Man, MD;  Location: [facility];  Service: Cardiovascular;; PTCA of ostOM2 55% -> 20%. (jailed)  . CORONARY STENT INTERVENTION N/A 12/07/2017   Procedure: CORONARY STENT INTERVENTION;  Surgeon: Leonie Man, MD;  Location: [facility];  Service: Cardiovascular;; m-dCx - Synergy DES 3 x 16 (3.3 mm), PTCA ostOM2 (-20%).  pLAD 95% - Synergy DES 3 x 16 (3.6 mm)  . LEFT HEART CATH N/A 12/07/2017   Procedure: Left Heart Cath;  Surgeon: Leonie Man, MD;  Location: [facility];  Service: Cardiovascular;  Laterality: N/A;  . LEFT HEART CATH AND CORONARY ANGIOGRAPHY N/A 11/29/2017   Procedure: LEFT HEART CATH AND CORONARY ANGIOGRAPHY;  Surgeon: Leonie Man, MD;  Location: [facility];; mRCA CTO, p-m LAD 85%, m-dCx 95% @ OM2 55%. Mod High LVEDP   . LIPOMA EXCISION    . SPINAL FIXATION SURGERY    . TRANSTHORACIC ECHOCARDIOGRAM  11/30/2017   EF 45-50%.  Inferior hypokinesis.  Elevated filling pressures.  Mild to moderate MR.     Current Meds  Medication Sig  . allopurinol (ZYLOPRIM) 100 MG tablet Take 100 mg by mouth every morning.  Marland Kitchen amiodarone (PACERONE) 100 MG tablet Take 1 tablet (100 mg total) by mouth daily.  Marland Kitchen aspirin EC 81 MG tablet Take 81 mg by mouth daily.  Marland Kitchen atorvastatin (LIPITOR) 40 MG tablet Take 1 tablet (40 mg total) by mouth daily at 6 PM.  . clopidogrel (PLAVIX) 75 MG tablet Take 1 tablet (75 mg total) by mouth daily.  . cyanocobalamin 1000 MCG tablet Take 1 tablet (1,000 mcg total) by mouth daily.  . Multiple Vitamins-Minerals (CENTRUM SILVER PO) Take 1 tablet by mouth daily.  .  nitroGLYCERIN (NITROSTAT) 0.4 MG SL tablet Place 1 tablet (0.4 mg total) under the tongue every 5 (five) minutes x 3 doses as needed for chest pain.  . pantoprazole (PROTONIX) 40 MG tablet Take 1 tablet (40 mg total) by mouth daily.  . Psyllium (METAMUCIL FIBER PO) Take by mouth.  . terazosin (HYTRIN) 2 MG capsule Take 4 mg by mouth every evening.  Marland Kitchen VITAMIN D, ERGOCALCIFEROL, PO Take 5,000 Units by mouth daily.     Allergies  Allergen Reactions  . Other Shortness Of Breath    Esters  . Contrast Media [Iodinated Diagnostic Agents] Other (See Comments)    Just feel bad all over  . Morphine And Related Other (See Comments)    Patient states he does not  like to take Morphine as it gives him nightmares and hallucinations.     Social History   Tobacco Use  . Smoking status: Former Research scientist (life sciences)  . Smokeless tobacco: Never Used  Substance Use Topics  . Alcohol use: Yes    Comment: rare  . Drug use: Never   Social History   Social History Narrative  . Not on file    family history is not on file.  Wt Readings from Last 3 Encounters:  01/22/18 180 lb 9.6 oz (81.9 kg)  12/18/17 185 lb (83.9 kg)  12/12/17 189 lb (85.7 kg)    PHYSICAL EXAM BP (!) 96/52   Pulse (!) 56   Ht 5\' 6"  (1.676 m)   Wt 180 lb 9.6 oz (81.9 kg)   BMI 29.15 kg/m  Physical Exam  Constitutional: He is oriented to person, place, and time. He appears well-developed and well-nourished. No distress.  Healthy-appearing.  Well-groomed.  HENT:  Head: Normocephalic and atraumatic.  Eyes: EOM are normal.  Neck: Normal range of motion. Neck supple. No hepatojugular reflux and no JVD present. Carotid bruit is not present. No thyromegaly present.  Cardiovascular: Regular rhythm, intact distal pulses and normal pulses.  No extrasystoles are present. Bradycardia present. PMI is not displaced. Exam reveals no gallop.  Murmur heard.  Harsh crescendo-decrescendo early systolic murmur is present with a grade of 1/6 at the  upper right sternal border radiating to the neck. Pulmonary/Chest: Effort normal and breath sounds normal. No respiratory distress. He has no wheezes. He has no rales.  Abdominal: Soft. Bowel sounds are normal. He exhibits no distension. There is no tenderness. There is no rebound.  No HJR  Musculoskeletal: Normal range of motion. He exhibits no edema.  Lymphadenopathy:    He has no cervical adenopathy.  Neurological: He is alert and oriented to person, place, and time.  Skin:  Mild UE ecchymoses  Psychiatric: He has a normal mood and affect. His behavior is normal. Judgment and thought content normal.  Vitals reviewed.    Adult ECG Report  Rate: 56 ;  Rhythm: sinus bradycardia and 1st Deg AVB.  Otherwise normal axis, intervals & durations;   Narrative Interpretation: '   Other studies Reviewed: Additional studies/ records that were reviewed today include:  Recent Labs:    CBC Latest Ref Rng & Units 12/12/2017 12/11/2017 12/11/2017  WBC 4.0 - 10.5 K/uL 11.6(H) 16.7(H) 19.3(H)  Hemoglobin 13.0 - 17.0 g/dL 9.7(L) 9.7(L) 9.3(L)  Hematocrit 39.0 - 52.0 % 30.4(L) 29.4(L) 29.3(L)  Platelets 150 - 400 K/uL 176 173 158   Lab Results  Component Value Date   CHOL 137 11/30/2017   HDL 38 (L) 11/30/2017   LDLCALC 80 11/30/2017   TRIG 93 11/30/2017   CHOLHDL 3.6 11/30/2017   Lab Results  Component Value Date   CREATININE 1.93 (H) 12/12/2017   BUN 28 (H) 12/12/2017   NA 135 12/12/2017   K 4.2 12/12/2017   CL 107 12/12/2017   CO2 20 (L) 12/12/2017     This patients CHA2DS2-VASc Score and unadjusted Ischemic Stroke Rate (% per year) is equal to 11.2 % stroke rate/year from a score of 7  Above score calculated as 1 point each if present [CHF, HTN, DM, Vascular=MI/PAD/Aortic Plaque, Age if 65-74, or Male]; 2 points each if present [Age > 75, or Stroke/TIA/TE]   ASSESSMENT / PLAN: Problem List Items Addressed This Visit    CAD S/P percutaneous coronary angioplasty (Chronic)    3V CAD  -  with CTO of RCA & PCI of Cx & LAD in setting of MI (~ STEMI)  Nor further angina Sx - is active, walking & soon to start [place].    On ASA & Plavix plus High Dose Atorvastatin --> OK to reduce statin to 40 mg. Not on Beta Blocker - is on Amiodarone & has borderline BP with bradycardia. Not on ACE-I or ARB due to borderline BP/hypotensive today.      Relevant Medications   atorvastatin (LIPITOR) 40 MG tablet   amiodarone (PACERONE) 100 MG tablet   Essential hypertension (Chronic)    Previously on ACE-I & hytrin - but with BPs in 90s-100s post MI --> no longer on ACE-I.      Relevant Medications   atorvastatin (LIPITOR) 40 MG tablet   amiodarone (PACERONE) 100 MG tablet   Hyperlipidemia with target low density lipoprotein (LDL) cholesterol less than 70 mg/dL (Chronic)    On HIGH dose Atorvastatin post MI - OK to reduce to 40 mg to avoid side effects.  Will need lipid check in 4-5 months.      Relevant Medications   atorvastatin (LIPITOR) 40 MG tablet   amiodarone (PACERONE) 100 MG tablet   Ischemic cardiomyopathy (Chronic)    Reduced LVEF by Echo per-MI. Inferior HK is clearly related to prior MI & RCA CTO.  This is probably chronic.  With borderline hypotension, unable to start Beta Blocker or ACE-I. Essentially hypovolemic on exam & by Sx -- no diuretic requirement.      Relevant Medications   atorvastatin (LIPITOR) 40 MG tablet   amiodarone (PACERONE) 100 MG tablet   PAF (paroxysmal atrial fibrillation) (HCC); CHA2DS2-VASc Score -7 (Chronic)    New onset Afib in the setting of STEMI - Currently not on full [place] - in part due to anemia post MI - + FOB & already on DAPT. On Amiodarone & maintaining NSR -- will continue with low dose daily Amiodarone to avoid breakthrough Afib.  Plan for now: contineu DAPT x ~6 months total -- reassess Hgb level & consider d/c ASA & add low dose Xarelto (based upon new guidelines - with CKD, would probably opt for 10 mg dose).   Continue  PPI  -- with Amiodarone - will need TFTs & LFTs followed.        Relevant Medications   atorvastatin (LIPITOR) 40 MG tablet   amiodarone (PACERONE) 100 MG tablet   Status post coronary artery stent placement (Chronic)    Currently on ASA/Plavix --> will reassess in 6 months to determine duration of DAPT vs. Stopping ASA & starting low dose DOAC for AFib.         I spent a total of 40 minutes with the patient and chart review. >  50% of the time was spent in direct patient consultation.   Current medicines are reviewed at length with the patient today.  (+/- concerns) n/a The following changes have been made:  see below.  Patient Instructions  Medication Instructions:  Decrease atorvastatin (Lipitor) to 40 mg daily Take amiodarone at dinner time  Labwork: At [facility] sure they check a CBC, TSH, CMET, Lipid panel  Follow-Up: Late November/Early December with Dr. Ellyn Hack  Any Other Special Instructions Will Be Listed Below (If Applicable).     If you need a refill on your cardiac medications before your next appointment, please call your pharmacy.      Studies Ordered:   No orders of the defined types were placed in this encounter.  Glenetta Hew, M.D., M.S. Interventional Cardiologist   Pager # [phone] Phone # [phone] [address] [place], [place] [address]   Thank you for choosing Heartcare at [facility]!!

## 2018-01-25 ENCOUNTER — Encounter: Payer: Self-pay | Admitting: Cardiology

## 2018-01-25 NOTE — Assessment & Plan Note (Signed)
Previously on ACE-I & hytrin - but with BPs in 90s-100s post MI --> no longer on ACE-I.

## 2018-01-25 NOTE — Assessment & Plan Note (Signed)
On HIGH dose Atorvastatin post MI - OK to reduce to 40 mg to avoid side effects.  Will need lipid check in 4-5 months.

## 2018-01-25 NOTE — Assessment & Plan Note (Signed)
Reduced LVEF by Echo per-MI. Inferior HK is clearly related to prior MI & RCA CTO.  This is probably chronic.  With borderline hypotension, unable to start Beta Blocker or ACE-I. Essentially hypovolemic on exam & by Sx -- no diuretic requirement.

## 2018-01-25 NOTE — Assessment & Plan Note (Signed)
Currently on ASA/Plavix --> will reassess in 6 months to determine duration of DAPT vs. Stopping ASA & starting low dose DOAC for AFib.

## 2018-01-25 NOTE — Assessment & Plan Note (Signed)
3V CAD - with CTO of RCA & PCI of Cx & LAD in setting of MI (~ STEMI)  Nor further angina Sx - is active, walking & soon to start [place].    On ASA & Plavix plus High Dose Atorvastatin --> OK to reduce statin to 40 mg. Not on Beta Blocker - is on Amiodarone & has borderline BP with bradycardia. Not on ACE-I or ARB due to borderline BP/hypotensive today.

## 2018-01-25 NOTE — Assessment & Plan Note (Addendum)
New onset Afib in the setting of STEMI - Currently not on full Francis - in part due to anemia post MI - + FOB & already on DAPT. On Amiodarone & maintaining NSR -- will continue with low dose daily Amiodarone to avoid breakthrough Afib.  Plan for now: contineu DAPT x ~6 months total -- reassess Hgb level & consider d/c ASA & add low dose Xarelto (based upon new guidelines - with CKD, would probably opt for 10 mg dose).   Continue PPI  -- with Amiodarone - will need TFTs & LFTs followed.

## 2018-01-28 ENCOUNTER — Telehealth (HOSPITAL_COMMUNITY): Payer: Self-pay

## 2018-02-01 NOTE — Addendum Note (Signed)
Addended by: Patria Mane A on: 02/01/2018 08:48 AM   Modules accepted: Orders

## 2018-02-02 NOTE — Telephone Encounter (Signed)
Cardiac Rehab Medication Review by a Pharmacist  Does the patient  feel that his/her medications are working for him/her?  yes  Has the patient been experiencing any side effects to the medications prescribed?  Yes, maybe a little more confusion than baseline  Does the patient measure his/her own blood pressure or blood glucose at home?  yes   Does the patient have any problems obtaining medications due to transportation or finances?   no   Understanding of regimen: good Understanding of indications: good Potential of compliance: good    Pharmacist comments: Spoke with his daughter who is his caretaker. She mentioned that she believed he was a little more confused lately than he has been at baseline which she thinks might be related to his medications. She also mentioned that the patient has been having difficulty following a low sodium diet.    Paul Kramer 02/02/2018 4:19 PM

## 2018-02-04 ENCOUNTER — Encounter (HOSPITAL_COMMUNITY)
Admission: RE | Admit: 2018-02-04 | Discharge: 2018-02-04 | Disposition: A | Discharge status: Home / Self Care | Payer: Medicare Other | Source: Ambulatory Visit | Attending: Cardiology | Admitting: Cardiology

## 2018-02-04 ENCOUNTER — Encounter (HOSPITAL_COMMUNITY): Payer: Self-pay

## 2018-02-04 VITALS — Ht 65.0 in | Wt 179.5 lb

## 2018-02-04 DIAGNOSIS — I10 Essential (primary) hypertension: Secondary | ICD-10-CM | POA: Insufficient documentation

## 2018-02-04 DIAGNOSIS — Z8673 Personal history of transient ischemic attack (TIA), and cerebral infarction without residual deficits: Secondary | ICD-10-CM | POA: Insufficient documentation

## 2018-02-04 DIAGNOSIS — I2510 Atherosclerotic heart disease of native coronary artery without angina pectoris: Secondary | ICD-10-CM | POA: Insufficient documentation

## 2018-02-04 DIAGNOSIS — Z87891 Personal history of nicotine dependence: Secondary | ICD-10-CM | POA: Insufficient documentation

## 2018-02-04 DIAGNOSIS — I714 Abdominal aortic aneurysm, without rupture: Secondary | ICD-10-CM | POA: Insufficient documentation

## 2018-02-04 DIAGNOSIS — Z7982 Long term (current) use of aspirin: Secondary | ICD-10-CM | POA: Insufficient documentation

## 2018-02-04 DIAGNOSIS — E7800 Pure hypercholesterolemia, unspecified: Secondary | ICD-10-CM | POA: Insufficient documentation

## 2018-02-04 DIAGNOSIS — I213 ST elevation (STEMI) myocardial infarction of unspecified site: Secondary | ICD-10-CM | POA: Diagnosis present

## 2018-02-04 DIAGNOSIS — Z79899 Other long term (current) drug therapy: Secondary | ICD-10-CM | POA: Diagnosis not present

## 2018-02-04 DIAGNOSIS — G47419 Narcolepsy without cataplexy: Secondary | ICD-10-CM | POA: Insufficient documentation

## 2018-02-04 DIAGNOSIS — Z7902 Long term (current) use of antithrombotics/antiplatelets: Secondary | ICD-10-CM | POA: Diagnosis not present

## 2018-02-04 DIAGNOSIS — Z955 Presence of coronary angioplasty implant and graft: Secondary | ICD-10-CM | POA: Insufficient documentation

## 2018-02-04 DIAGNOSIS — I2102 ST elevation (STEMI) myocardial infarction involving left anterior descending coronary artery: Secondary | ICD-10-CM | POA: Diagnosis not present

## 2018-02-04 HISTORY — DX: Atherosclerotic heart disease of native coronary artery without angina pectoris: I25.10

## 2018-02-04 NOTE — Progress Notes (Signed)
Paul Kramer 84 y.o. male DOB: 11-Jun-1930 MRN: [id number]      Nutrition Note  No diagnosis found. Past Medical History:  Diagnosis Date  . AAA (abdominal aortic aneurysm) ([place])   . Hypercholesteremia   . Hypertension   . Myocardial infarction of inferolateral wall ([place]) 11/2017   ? prior Infarct; - Cath 11/29/17 - 3 V CAD (CTO RCA), 95% m-d Cx (@ OM2 55%)-> PCI Cx-PTCA Om2; 85% p-m LAD => DES PCI   . Narcolepsy    pt may pass out when laughing  . Spinal stenosis   . TIA (transient ischemic attack)    Meds reviewed. Atorvastatin, Vit B 12, MVI, Vit D +calcium noted  HT: Ht Readings from Last 1 Encounters:  02/04/18 5\' 5"  (1.651 m)    WT: Wt Readings from Last 5 Encounters:  02/04/18 179 lb 7.3 oz (81.4 kg)  01/22/18 180 lb 9.6 oz (81.9 kg)  12/18/17 185 lb (83.9 kg)  12/12/17 189 lb (85.7 kg)  12/11/17 198 lb 3.1 oz (89.9 kg)     Body mass index is 29.86 kg/m.   Current tobacco use? No   Labs:  Lipid Panel     Component Value Date/Time   CHOL 137 11/30/2017 0017   TRIG 93 11/30/2017 0017   HDL 38 (L) 11/30/2017 0017   CHOLHDL 3.6 11/30/2017 0017   VLDL 19 11/30/2017 0017   LDLCALC 80 11/30/2017 0017    Lab Results  Component Value Date   HGBA1C 5.7 (H) 11/30/2017   CBG (last 3)  No results for input(s): GLUCAP in the last 72 hours.  Nutrition Note Spoke with pt. Nutrition plan and goals reviewed with pt. Pt is following Step 2 of the Therapeutic Lifestyle Changes diet. Pt wants to lose wt. Pt has been trying to lose wt by not eating out, cooks at home, eliminated fried foods, watches out for sodium, consumes lean protein, complex carbs, and watches his portion sizes. Wt loss tips reviewed. Pt is pre-diabetic. Last A1c 5.7. Recommended consistent carbohydrate heart healthy diet. Per discussion, pt does not use canned/convenience foods often. Pt rarely adds salt to food. Pt eats out infrequently. Pt expressed understanding of the information reviewed. Pt aware of  nutrition education classes offered and plans on attending nutrition classes.  Nutrition Diagnosis ? Food-and nutrition-related knowledge deficit related to lack of exposure to information as related to diagnosis of: ? CVD ? Pre-DM ? Overweight related to excessive energy intake as evidenced by a Body mass index is 29.86 kg/m.  Nutrition Intervention ? Pt's individual nutrition plan and goals reviewed with pt.  Nutrition Goal(s):   ? Pt to identify and limit food sources of saturated fat, trans fat, sodium, refined carbohydrates ? Pt to identify food quantities necessary to achieve weight loss of 6-24 lbs. at graduation from cardiac rehab. Goal wt of 170 lb desired.  ? Pt able to name foods that affect blood glucose.   Plan:  ? Pt to attend nutrition classes ? Nutrition I ? Nutrition II ? Portion Distortion  ? Diabetes Blitz ? Diabetes Q & Ae determined ? Will provide client-centered nutrition education as part of interdisciplinary care ? Monitor and evaluate progress toward nutrition goal with team.   Laurina Bustle, MS, RD, LDN 02/04/2018 10:28 AM

## 2018-02-04 NOTE — Progress Notes (Signed)
, Cardiac Individual Treatment Plan  Patient Details  Name: Antione Obar MRN: [id number] Date of Birth: 1929/09/02 Referring Provider:     [facility] from 02/04/2018 in [place]  Referring Provider  Leonie Man MD       Initial Encounter Date:    CARDIAC REHAB PHASE II ORIENTATION from 02/04/2018 in [place]  Date  02/04/18      Visit Diagnosis: ST elevation myocardial infarction involving left anterior descending (LAD) coronary artery [facility])  Status post coronary artery stent placement  Patient's Home Medications on Admission:  Current Outpatient Medications:  .  acetaminophen (TYLENOL) 325 MG tablet, Take 325 mg by mouth every 6 (six) hours as needed for mild pain., Disp: , Rfl:  .  allopurinol (ZYLOPRIM) 100 MG tablet, Take 100 mg by mouth every morning., Disp: , Rfl:  .  amiodarone (PACERONE) 100 MG tablet, Take 1 tablet (100 mg total) by mouth daily., Disp: 90 tablet, Rfl: 3 .  aspirin EC 81 MG tablet, Take 81 mg by mouth daily., Disp: , Rfl:  .  atorvastatin (LIPITOR) 40 MG tablet, Take 1 tablet (40 mg total) by mouth daily at 6 PM., Disp: 90 tablet, Rfl: 3 .  clopidogrel (PLAVIX) 75 MG tablet, Take 1 tablet (75 mg total) by mouth daily., Disp: 90 tablet, Rfl: 3 .  cyanocobalamin 1000 MCG tablet, Take 1 tablet (1,000 mcg total) by mouth daily., Disp: 90 tablet, Rfl: 3 .  Multiple Vitamins-Minerals (CENTRUM SILVER PO), Take 1 tablet by mouth daily., Disp: , Rfl:  .  nitroGLYCERIN (NITROSTAT) 0.4 MG SL tablet, Place 1 tablet (0.4 mg total) under the tongue every 5 (five) minutes x 3 doses as needed for chest pain., Disp: 25 tablet, Rfl: 3 .  pantoprazole (PROTONIX) 40 MG tablet, Take 1 tablet (40 mg total) by mouth daily., Disp: 90 tablet, Rfl: 3 .  Psyllium (METAMUCIL FIBER PO), Take by mouth., Disp: , Rfl:  .  terazosin (HYTRIN) 2 MG capsule, Take 4 mg by mouth every evening., Disp: ,  Rfl:  .  VITAMIN D, ERGOCALCIFEROL, PO, Take 5,000 Units by mouth daily. , Disp: , Rfl:   Past Medical History: Past Medical History:  Diagnosis Date  . AAA (abdominal aortic aneurysm) ([place])   . Coronary artery disease   . Hypercholesteremia   . Hypertension   . Myocardial infarction of inferolateral wall ([place]) 11/2017   ? prior Infarct; - Cath 11/29/17 - 3 V CAD (CTO RCA), 95% m-d Cx (@ OM2 55%)-> PCI Cx-PTCA Om2; 85% p-m LAD => DES PCI   . Narcolepsy    pt may pass out when laughing  . Spinal stenosis   . TIA (transient ischemic attack)     Tobacco Use: Social History   Tobacco Use  Smoking Status Former Smoker  Smokeless Tobacco Never Used    Labs: Recent [employer] for ITP Cardiac and Pulmonary Rehab Latest Ref Rng & Units 11/30/2017   Cholestrol 0 - 200 mg/dL 137   LDLCALC 0 - 99 mg/dL 80   HDL >40 mg/dL 38(L)   Trlycerides <150 mg/dL 93   Hemoglobin A1c 4.8 - 5.6 % 5.7(H)      Capillary Blood Glucose: Lab Results  Component Value Date   GLUCAP 91 12/03/2017     Exercise Target Goals: Date: 02/04/18  Exercise Program Goal: Individual exercise prescription set using results from initial 6 min walk  test and THRR while considering  patient's activity barriers and safety.   Exercise Prescription Goal: Initial exercise prescription builds to 30-45 minutes a day of aerobic activity, 2-3 days per week.  Home exercise guidelines will be given to patient during program as part of exercise prescription that the participant will acknowledge.  Activity Barriers & Risk Stratification: Activity Barriers & Cardiac Risk Stratification - 02/04/18 1230      Activity Barriers & Cardiac Risk Stratification   Activity Barriers  Back Problems;Other (comment)    Comments  Chronic Right Knee Pain " No Cartilage", [place]: may pass out from laughing     Cardiac Risk Stratification  High       6 Minute Walk: 6 Minute Walk    Row Name 02/04/18 1212          6 Minute Walk   Phase  Initial     Distance  1459 feet     Walk Time  6 minutes     # of Rest Breaks  0     MPH  2.76     METS  1.86     RPE  9     Perceived Dyspnea   0     VO2 Peak  6.49     Symptoms  No     Resting HR  56 bpm     Resting BP  122/60     Resting Oxygen Saturation   97 %     Exercise Oxygen Saturation  during 6 min walk  98 %     Max Ex. HR  62 bpm     Max Ex. BP  138/80     2 Minute Post BP  138/78        Oxygen Initial Assessment:   Oxygen Re-Evaluation:   Oxygen Discharge (Final Oxygen Re-Evaluation):   Initial Exercise Prescription: Initial Exercise Prescription - 02/04/18 1200      Date of Initial Exercise RX and Referring Provider   Date  02/04/18    Referring Provider  Leonie Man MD     Expected Discharge Date  04/30/18      NuStep   Level  2    SPM  75    Minutes  15    METs  1.8      Track   Laps  12    Minutes  10    METs  2.39      Prescription Details   Frequency (times per week)  3x    Duration  Progress to 30 minutes of continuous aerobic without signs/symptoms of physical distress      Intensity   THRR 40-80% of Max Heartrate  54-108    Ratings of Perceived Exertion  11-13    Perceived Dyspnea  0-4      Progression   Progression  Continue progressive overload as per policy without signs/symptoms or physical distress.      Resistance Training   Training Prescription  Yes    Weight  3lbs    Reps  10-15       Perform Capillary Blood Glucose checks as needed.  Exercise Prescription Changes:   Exercise Comments:   Exercise Goals and Review:  Exercise Goals    Row Name 02/04/18 1214             Exercise Goals   Increase Physical Activity  Yes       Intervention  Provide advice, education, support and counseling about physical activity/exercise needs.;Develop  an individualized exercise prescription for aerobic and resistive training based on initial evaluation findings, risk stratification,  comorbidities and participant's personal goals.       Expected Outcomes  Short Term: Attend rehab on a regular basis to increase amount of physical activity.;Long Term: Add in home exercise to make exercise part of routine and to increase amount of physical activity.;Long Term: Exercising regularly at least 3-5 days a week.       Increase Strength and Stamina  Yes       Intervention  Provide advice, education, support and counseling about physical activity/exercise needs.;Develop an individualized exercise prescription for aerobic and resistive training based on initial evaluation findings, risk stratification, comorbidities and participant's personal goals.       Expected Outcomes  Short Term: Perform resistance training exercises routinely during rehab and add in resistance training at home;Short Term: Increase workloads from initial exercise prescription for resistance, speed, and METs.;Long Term: Improve cardiorespiratory fitness, muscular endurance and strength as measured by increased METs and functional capacity (6MWT)       Able to understand and use rate of perceived exertion (RPE) scale  Yes       Intervention  Provide education and explanation on how to use RPE scale       Expected Outcomes  Short Term: Able to use RPE daily in rehab to express subjective intensity level;Long Term:  Able to use RPE to guide intensity level when exercising independently       Knowledge and understanding of Target Heart Rate Range (THRR)  Yes       Intervention  Provide education and explanation of THRR including how the numbers were predicted and where they are located for reference       Expected Outcomes  Short Term: Able to state/look up THRR;Short Term: Able to use daily as guideline for intensity in rehab;Long Term: Able to use THRR to govern intensity when exercising independently       Able to check pulse independently  Yes       Intervention  Provide education and demonstration on how to check pulse in  carotid and radial arteries.;Review the importance of being able to check your own pulse for safety during independent exercise       Expected Outcomes  Short Term: Able to explain why pulse checking is important during independent exercise;Long Term: Able to check pulse independently and accurately       Understanding of Exercise Prescription  Yes       Intervention  Provide education, explanation, and written materials on patient's individual exercise prescription       Expected Outcomes  Short Term: Able to explain program exercise prescription;Long Term: Able to explain home exercise prescription to exercise independently          Exercise Goals Re-Evaluation :    Discharge Exercise Prescription (Final Exercise Prescription Changes):   Nutrition:  Target Goals: Understanding of nutrition guidelines, daily intake of sodium 1500mg , cholesterol 200mg , calories 30% from fat and 7% or less from saturated fats, daily to have 5 or more servings of fruits and vegetables.  Biometrics: Pre Biometrics - 02/04/18 1214      Pre Biometrics   Height  5\' 5"  (1.651 m)    Weight  81.4 kg    Waist Circumference  41 inches    Hip Circumference  42 inches    Waist to Hip Ratio  0.98 %    BMI (Calculated)  29.86    Triceps Skinfold  26 mm    % Body Fat  32.1 %    Grip Strength  33 kg    Flexibility  12.5 in    Single Leg Stand  0 seconds        Nutrition Therapy Plan and Nutrition Goals: Nutrition Therapy & Goals - 02/04/18 1035      Nutrition Therapy   Diet  consistent carbohydrate heart healthy      Personal Nutrition Goals   Nutrition Goal  Pt to identify and limit food sources of saturated fat, trans fat, sodium, refined carbohydrates    Personal Goal #2  Pt to identify food quantities necessary to achieve weight loss of 6-24 lbs. at graduation from cardiac rehab.    Personal Goal #3  Pt able to name foods that affect blood glucose.      Intervention Plan   Intervention   Prescribe, educate and counsel regarding individualized specific dietary modifications aiming towards targeted core components such as weight, hypertension, lipid management, diabetes, heart failure and other comorbidities.    Expected Outcomes  Short Term Goal: Understand basic principles of dietary content, such as calories, fat, sodium, cholesterol and nutrients.       Nutrition Assessments: Nutrition Assessments - 02/04/18 1036      MEDFICTS Scores   Pre Score  12       Nutrition Goals Re-Evaluation: Nutrition Goals Re-Evaluation    [place] Name 02/04/18 1035             Goals   Current Weight  179 lb 7.3 oz (81.4 kg)          Nutrition Goals Re-Evaluation: Nutrition Goals Re-Evaluation    [place] Name 02/04/18 1035             Goals   Current Weight  179 lb 7.3 oz (81.4 kg)          Nutrition Goals Discharge (Final Nutrition Goals Re-Evaluation): Nutrition Goals Re-Evaluation - 02/04/18 1035      Goals   Current Weight  179 lb 7.3 oz (81.4 kg)       Psychosocial: Target Goals: Acknowledge presence or absence of significant depression and/or stress, maximize coping skills, provide positive support system. Participant is able to verbalize types and ability to use techniques and skills needed for reducing stress and depression.  Initial Review & Psychosocial Screening: Initial Psych Review & Screening - 02/04/18 1038      Initial Review   Current issues with  None Identified      Family Dynamics   Good Support System?  Yes      Barriers   Psychosocial barriers to participate in program  There are no identifiable barriers or psychosocial needs.      Screening Interventions   Interventions  Encouraged to exercise       Quality of Life Scores: Quality of Life - 02/04/18 1045      Quality of Life   Select  Quality of Life      Quality of Life Scores   Health/Function Pre  27.63 %    Socioeconomic Pre  30 %    Psych/Spiritual Pre  30 %    Family Pre   30 %    GLOBAL Pre  28.89 %      Scores of 19 and below usually indicate a poorer quality of life in these areas.  A difference of  2-3 points is a clinically meaningful difference.  A difference of 2-3 points in the total score  of the Quality of Life Index has been associated with significant improvement in overall quality of life, self-image, physical symptoms, and general health in studies assessing change in quality of life.  PHQ-9: Recent Review Flowsheet Data    There is no flowsheet data to display.     Interpretation of Total Score  Total Score Depression Severity:  1-4 = Minimal depression, 5-9 = Mild depression, 10-14 = Moderate depression, 15-19 = Moderately severe depression, 20-27 = Severe depression   Psychosocial Evaluation and Intervention:   Psychosocial Re-Evaluation:   Psychosocial Discharge (Final Psychosocial Re-Evaluation):   Vocational Rehabilitation: Provide vocational rehab assistance to qualifying candidates.   Vocational Rehab Evaluation & Intervention: Vocational Rehab - 02/04/18 1038      Initial Vocational Rehab Evaluation & Intervention   Assessment shows need for Vocational Rehabilitation  No       Education: Education Goals: Education classes will be provided on a weekly basis, covering required topics. Participant will state understanding/return demonstration of topics presented.  Learning Barriers/Preferences: Learning Barriers/Preferences - 02/04/18 1227      Learning Barriers/Preferences   Learning Barriers  Sight;Hearing    Learning Preferences  Written Material;Video;Skilled Demonstration       Education Topics: Count Your Pulse:  -Group instruction provided by verbal instruction, demonstration, patient participation and written materials to support subject.  Instructors address importance of being able to find your pulse and how to count your pulse when at home without a heart monitor.  Patients get hands on experience counting  their pulse with staff help and individually.   Heart Attack, Angina, and Risk Factor Modification:  -Group instruction provided by verbal instruction, video, and written materials to support subject.  Instructors address signs and symptoms of angina and heart attacks.    Also discuss risk factors for heart disease and how to make changes to improve heart health risk factors.   Functional Fitness:  -Group instruction provided by verbal instruction, demonstration, patient participation, and written materials to support subject.  Instructors address safety measures for doing things around the house.  Discuss how to get up and down off the floor, how to pick things up properly, how to safely get out of a chair without assistance, and balance training.   Meditation and Mindfulness:  -Group instruction provided by verbal instruction, patient participation, and written materials to support subject.  Instructor addresses importance of mindfulness and meditation practice to help reduce stress and improve awareness.  Instructor also leads participants through a meditation exercise.    Stretching for Flexibility and Mobility:  -Group instruction provided by verbal instruction, patient participation, and written materials to support subject.  Instructors lead participants through series of stretches that are designed to increase flexibility thus improving mobility.  These stretches are additional exercise for major muscle groups that are typically performed during regular warm up and cool down.   Hands Only CPR:  -Group verbal, video, and participation provides a basic overview of AHA guidelines for community CPR. Role-play of emergencies allow participants the opportunity to practice calling for help and chest compression technique with discussion of AED use.   Hypertension: -Group verbal and written instruction that provides a basic overview of hypertension including the most recent diagnostic  guidelines, risk factor reduction with self-care instructions and medication management.    Nutrition I class: Heart Healthy Eating:  -Group instruction provided by PowerPoint slides, verbal discussion, and written materials to support subject matter. The instructor gives an explanation and review of the Therapeutic Lifestyle Changes  diet recommendations, which includes a discussion on lipid goals, dietary fat, sodium, fiber, plant stanol/sterol esters, sugar, and the components of a well-balanced, healthy diet.   Nutrition II class: Lifestyle Skills:  -Group instruction provided by PowerPoint slides, verbal discussion, and written materials to support subject matter. The instructor gives an explanation and review of label reading, grocery shopping for heart health, heart healthy recipe modifications, and ways to make healthier choices when eating out.   Diabetes Question & Answer:  -Group instruction provided by PowerPoint slides, verbal discussion, and written materials to support subject matter. The instructor gives an explanation and review of diabetes co-morbidities, pre- and post-prandial blood glucose goals, pre-exercise blood glucose goals, signs, symptoms, and treatment of hypoglycemia and hyperglycemia, and foot care basics.   Diabetes Blitz:  -Group instruction provided by PowerPoint slides, verbal discussion, and written materials to support subject matter. The instructor gives an explanation and review of the physiology behind type 1 and type 2 diabetes, diabetes medications and rational behind using different medications, pre- and post-prandial blood glucose recommendations and Hemoglobin A1c goals, diabetes diet, and exercise including blood glucose guidelines for exercising safely.    Portion Distortion:  -Group instruction provided by PowerPoint slides, verbal discussion, written materials, and food models to support subject matter. The instructor gives an explanation of serving  size versus portion size, changes in portions sizes over the last 20 years, and what consists of a serving from each food group.   Stress Management:  -Group instruction provided by verbal instruction, video, and written materials to support subject matter.  Instructors review role of stress in heart disease and how to cope with stress positively.     Exercising on Your Own:  -Group instruction provided by verbal instruction, power point, and written materials to support subject.  Instructors discuss benefits of exercise, components of exercise, frequency and intensity of exercise, and end points for exercise.  Also discuss use of nitroglycerin and activating EMS.  Review options of places to exercise outside of rehab.  Review guidelines for sex with heart disease.   Cardiac Drugs I:  -Group instruction provided by verbal instruction and written materials to support subject.  Instructor reviews cardiac drug classes: antiplatelets, anticoagulants, beta blockers, and statins.  Instructor discusses reasons, side effects, and lifestyle considerations for each drug class.   Cardiac Drugs II:  -Group instruction provided by verbal instruction and written materials to support subject.  Instructor reviews cardiac drug classes: angiotensin converting enzyme inhibitors (ACE-I), angiotensin II receptor blockers (ARBs), nitrates, and calcium channel blockers.  Instructor discusses reasons, side effects, and lifestyle considerations for each drug class.   Anatomy and Physiology of the Circulatory System:  Group verbal and written instruction and models provide basic cardiac anatomy and physiology, with the coronary electrical and arterial systems. Review of: AMI, Angina, Valve disease, Heart Failure, Peripheral Artery Disease, Cardiac Arrhythmia, Pacemakers, and the ICD.   Other Education:  -Group or individual verbal, written, or video instructions that support the educational goals of the cardiac rehab  program.   Holiday Eating Survival Tips:  -Group instruction provided by PowerPoint slides, verbal discussion, and written materials to support subject matter. The instructor gives patients tips, tricks, and techniques to help them not only survive but enjoy the holidays despite the onslaught of food that accompanies the holidays.   Knowledge Questionnaire Score: Knowledge Questionnaire Score - 02/04/18 1038      Knowledge Questionnaire Score   Pre Score  20/24  Core Components/Risk Factors/Patient Goals at Admission: Personal Goals and Risk Factors at Admission - 02/04/18 1228      Core Components/Risk Factors/Patient Goals on Admission    Weight Management  Yes    Intervention  Weight Management: Develop a combined nutrition and exercise program designed to reach desired caloric intake, while maintaining appropriate intake of nutrient and fiber, sodium and fats, and appropriate energy expenditure required for the weight goal.;Weight Management: Provide education and appropriate resources to help participant work on and attain dietary goals.;Weight Management/Obesity: Establish reasonable short term and long term weight goals.    Admit Weight  179 lb 7.3 oz (81.4 kg)    Goal Weight: Short Term  175 lb (79.4 kg)    Goal Weight: Long Term  169 lb (76.7 kg)    Expected Outcomes  Short Term: Continue to assess and modify interventions until short term weight is achieved;Long Term: Adherence to nutrition and physical activity/exercise program aimed toward attainment of established weight goal;Weight Loss: Understanding of general recommendations for a balanced deficit meal plan, which promotes 1-2 lb weight loss per week and includes a negative energy balance of [phone] kcal/d;Understanding recommendations for meals to include 15-35% energy as protein, 25-35% energy from fat, 35-60% energy from carbohydrates, less than 200mg  of dietary cholesterol, 20-35 gm of total fiber  daily;Understanding of distribution of calorie intake throughout the day with the consumption of 4-5 meals/snacks    Hypertension  Yes    Intervention  Provide education on lifestyle modifcations including regular physical activity/exercise, weight management, moderate sodium restriction and increased consumption of fresh fruit, vegetables, and low fat dairy, alcohol moderation, and smoking cessation.;Monitor prescription use compliance.    Expected Outcomes  Short Term: Continued assessment and intervention until BP is < 140/49mm HG in hypertensive participants. < 130/71mm HG in hypertensive participants with diabetes, heart failure or chronic kidney disease.;Long Term: Maintenance of blood pressure at goal levels.    Lipids  Yes    Intervention  Provide education and support for participant on nutrition & aerobic/resistive exercise along with prescribed medications to achieve LDL 70mg , HDL >40mg .    Expected Outcomes  Short Term: Participant states understanding of desired cholesterol values and is compliant with medications prescribed. Participant is following exercise prescription and nutrition guidelines.;Long Term: Cholesterol controlled with medications as prescribed, with individualized exercise RX and with personalized nutrition plan. Value goals: LDL < 70mg , HDL > 40 mg.       Core Components/Risk Factors/Patient Goals Review:    Core Components/Risk Factors/Patient Goals at Discharge (Final Review):    ITP Comments: ITP Comments    Row Name 02/04/18 1210           ITP Comments  Dr. Fransico Him, Medical Director           Comments: [name] attended orientation from [place] to [address] to review rules and guidelines for program. Completed 6 minute walk test, Intitial ITP, and exercise prescription.  VSS. Telemetry-Sinus Rhythm, first degree heart block, this has been previously documented.  Asymptomatic. [name] used a rollator for stability and had cut an opening in his shoe due to a stubbed  big toe. Barnet Pall, RN,BSN 02/04/2018 12:53 PM

## 2018-02-08 ENCOUNTER — Encounter (HOSPITAL_COMMUNITY): Payer: Medicare Other

## 2018-02-08 ENCOUNTER — Encounter (HOSPITAL_COMMUNITY)
Admission: RE | Admit: 2018-02-08 | Discharge: 2018-02-08 | Disposition: A | Discharge status: Home / Self Care | Payer: Medicare Other | Source: Ambulatory Visit | Attending: Cardiology | Admitting: Cardiology

## 2018-02-08 DIAGNOSIS — Z955 Presence of coronary angioplasty implant and graft: Secondary | ICD-10-CM | POA: Diagnosis not present

## 2018-02-08 DIAGNOSIS — E7800 Pure hypercholesterolemia, unspecified: Secondary | ICD-10-CM | POA: Diagnosis not present

## 2018-02-08 DIAGNOSIS — I10 Essential (primary) hypertension: Secondary | ICD-10-CM | POA: Diagnosis not present

## 2018-02-08 DIAGNOSIS — I2102 ST elevation (STEMI) myocardial infarction involving left anterior descending coronary artery: Secondary | ICD-10-CM | POA: Diagnosis not present

## 2018-02-08 DIAGNOSIS — I2510 Atherosclerotic heart disease of native coronary artery without angina pectoris: Secondary | ICD-10-CM | POA: Diagnosis not present

## 2018-02-08 DIAGNOSIS — Z8673 Personal history of transient ischemic attack (TIA), and cerebral infarction without residual deficits: Secondary | ICD-10-CM | POA: Diagnosis not present

## 2018-02-08 NOTE — Progress Notes (Addendum)
Daily Session Note  Patient Details  Name: Paul Kramer MRN: [id number] Date of Birth: February 07, 1930 Referring Provider:     [place] from 02/04/2018 in [place]  Referring Provider  Leonie Man MD       Encounter Date: 02/08/2018  Check In: Session Check In - 02/08/18 1049      Check-In   Supervising physician immediately available to respond to emergencies  [facility] immediately available    Physician(s)  Dr. Florene Glen    Location  [facility]    Staff Present  Dorna Bloom, MS, ACSM RCEP, Exercise Physiologist;Tyara Carol Ada, MS,ACSM CEP, Exercise Physiologist;Joann Rion, RN, Marga Melnick, RN, Deland Pretty, MS, ACSM CEP, Exercise Physiologist    Medication changes reported      No    Fall or balance concerns reported     No    Tobacco Cessation  No Change    Warm-up and Cool-down  Performed as group-led instruction    Resistance Training Performed  Yes    VAD Patient?  No    PAD/SET Patient?  No      Pain Assessment   Currently in Pain?  No/denies    Multiple Pain Sites  No       Capillary Blood Glucose: No results found for this or any previous visit (from the past 24 hour(s)).  Exercise Prescription Changes - 02/08/18 0958      Response to Exercise   Blood Pressure (Admit)  118/60    Blood Pressure (Exercise)  138/72    Blood Pressure (Exit)  130/70    Heart Rate (Admit)  62 bpm    Heart Rate (Exercise)  99 bpm    Heart Rate (Exit)  55 bpm    Rating of Perceived Exertion (Exercise)  11    Symptoms  none    Duration  Progress to 30 minutes of  aerobic without signs/symptoms of physical distress    Intensity  THRR unchanged      Progression   Progression  Continue to progress workloads to maintain intensity without signs/symptoms of physical distress.    Average METs  2.4      Resistance Training   Training Prescription  Yes    Weight  3lbs    Reps  10-15    Time   10 Minutes      Interval Training   Interval Training  No      NuStep   Level  2    SPM  85    Minutes  15    METs  2.6      Track   Laps  10    Minutes  15    METs  2.16       Social History   Tobacco Use  Smoking Status Former Smoker  Smokeless Tobacco Never Used    Goals Met:  No report of cardiac concerns or symptoms  Goals Unmet:  Not Applicable  Comments: Pt started cardiac rehab today.  Pt tolerated light exercise without difficulty. VSS, telemetry-Sinus Rhythm with a first degree heart blcok, asymptomatic.  Medication list reconciled. Pt denies barriers to medicaiton compliance.  PSYCHOSOCIAL ASSESSMENT:  PHQ-0. Pt exhibits positive coping skills, hopeful outlook with supportive family. No psychosocial needs identified at this time, no psychosocial interventions necessary.    Pt enjoys antique guns.   Pt oriented to exercise equipment and routine. Paul Kramer uses a rollator with exercise for stability.   Understanding  verbalized.Barnet Pall, RN,BSN 02/08/2018 3:53 PM   Dr. Fransico Him is Medical Director for Cardiac Rehab at [facility].

## 2018-02-10 ENCOUNTER — Encounter (HOSPITAL_COMMUNITY)
Admission: RE | Admit: 2018-02-10 | Discharge: 2018-02-10 | Disposition: A | Discharge status: Home / Self Care | Payer: Medicare Other | Source: Ambulatory Visit | Attending: Cardiology | Admitting: Cardiology

## 2018-02-10 ENCOUNTER — Encounter (HOSPITAL_COMMUNITY): Payer: Medicare Other

## 2018-02-10 DIAGNOSIS — Z8673 Personal history of transient ischemic attack (TIA), and cerebral infarction without residual deficits: Secondary | ICD-10-CM | POA: Diagnosis not present

## 2018-02-10 DIAGNOSIS — I2510 Atherosclerotic heart disease of native coronary artery without angina pectoris: Secondary | ICD-10-CM | POA: Diagnosis not present

## 2018-02-10 DIAGNOSIS — Z955 Presence of coronary angioplasty implant and graft: Secondary | ICD-10-CM

## 2018-02-10 DIAGNOSIS — I2102 ST elevation (STEMI) myocardial infarction involving left anterior descending coronary artery: Secondary | ICD-10-CM | POA: Diagnosis not present

## 2018-02-10 DIAGNOSIS — E7800 Pure hypercholesterolemia, unspecified: Secondary | ICD-10-CM | POA: Diagnosis not present

## 2018-02-10 DIAGNOSIS — I10 Essential (primary) hypertension: Secondary | ICD-10-CM | POA: Diagnosis not present

## 2018-02-12 ENCOUNTER — Encounter (HOSPITAL_COMMUNITY)
Admission: RE | Admit: 2018-02-12 | Discharge: 2018-02-12 | Disposition: A | Discharge status: Home / Self Care | Payer: Medicare Other | Source: Ambulatory Visit | Attending: Cardiology | Admitting: Cardiology

## 2018-02-12 ENCOUNTER — Encounter (HOSPITAL_COMMUNITY): Payer: Medicare Other

## 2018-02-12 DIAGNOSIS — Z8673 Personal history of transient ischemic attack (TIA), and cerebral infarction without residual deficits: Secondary | ICD-10-CM | POA: Diagnosis not present

## 2018-02-12 DIAGNOSIS — Z955 Presence of coronary angioplasty implant and graft: Secondary | ICD-10-CM

## 2018-02-12 DIAGNOSIS — I2510 Atherosclerotic heart disease of native coronary artery without angina pectoris: Secondary | ICD-10-CM | POA: Diagnosis not present

## 2018-02-12 DIAGNOSIS — I2102 ST elevation (STEMI) myocardial infarction involving left anterior descending coronary artery: Secondary | ICD-10-CM

## 2018-02-12 DIAGNOSIS — E7800 Pure hypercholesterolemia, unspecified: Secondary | ICD-10-CM | POA: Diagnosis not present

## 2018-02-12 DIAGNOSIS — I10 Essential (primary) hypertension: Secondary | ICD-10-CM | POA: Diagnosis not present

## 2018-02-15 ENCOUNTER — Encounter (HOSPITAL_COMMUNITY)
Admission: RE | Admit: 2018-02-15 | Discharge: 2018-02-15 | Disposition: A | Discharge status: Home / Self Care | Payer: Medicare Other | Source: Ambulatory Visit | Attending: Cardiology | Admitting: Cardiology

## 2018-02-15 ENCOUNTER — Encounter (HOSPITAL_COMMUNITY): Payer: Medicare Other

## 2018-02-15 DIAGNOSIS — I2102 ST elevation (STEMI) myocardial infarction involving left anterior descending coronary artery: Secondary | ICD-10-CM

## 2018-02-15 DIAGNOSIS — I10 Essential (primary) hypertension: Secondary | ICD-10-CM | POA: Diagnosis not present

## 2018-02-15 DIAGNOSIS — Z955 Presence of coronary angioplasty implant and graft: Secondary | ICD-10-CM | POA: Diagnosis not present

## 2018-02-15 DIAGNOSIS — I2510 Atherosclerotic heart disease of native coronary artery without angina pectoris: Secondary | ICD-10-CM | POA: Diagnosis not present

## 2018-02-15 DIAGNOSIS — Z8673 Personal history of transient ischemic attack (TIA), and cerebral infarction without residual deficits: Secondary | ICD-10-CM | POA: Diagnosis not present

## 2018-02-15 DIAGNOSIS — E7800 Pure hypercholesterolemia, unspecified: Secondary | ICD-10-CM | POA: Diagnosis not present

## 2018-02-17 ENCOUNTER — Encounter (HOSPITAL_COMMUNITY)
Admission: RE | Admit: 2018-02-17 | Discharge: 2018-02-17 | Disposition: A | Discharge status: Home / Self Care | Payer: Medicare Other | Source: Ambulatory Visit | Attending: Cardiology | Admitting: Cardiology

## 2018-02-17 ENCOUNTER — Encounter (HOSPITAL_COMMUNITY): Payer: Medicare Other

## 2018-02-17 DIAGNOSIS — Z955 Presence of coronary angioplasty implant and graft: Secondary | ICD-10-CM

## 2018-02-17 DIAGNOSIS — E7800 Pure hypercholesterolemia, unspecified: Secondary | ICD-10-CM | POA: Diagnosis not present

## 2018-02-17 DIAGNOSIS — Z8673 Personal history of transient ischemic attack (TIA), and cerebral infarction without residual deficits: Secondary | ICD-10-CM | POA: Diagnosis not present

## 2018-02-17 DIAGNOSIS — I10 Essential (primary) hypertension: Secondary | ICD-10-CM | POA: Diagnosis not present

## 2018-02-17 DIAGNOSIS — I2102 ST elevation (STEMI) myocardial infarction involving left anterior descending coronary artery: Secondary | ICD-10-CM | POA: Diagnosis not present

## 2018-02-17 DIAGNOSIS — I2510 Atherosclerotic heart disease of native coronary artery without angina pectoris: Secondary | ICD-10-CM | POA: Diagnosis not present

## 2018-02-19 ENCOUNTER — Encounter (HOSPITAL_COMMUNITY): Payer: Medicare Other

## 2018-02-22 ENCOUNTER — Encounter (HOSPITAL_COMMUNITY): Payer: Medicare Other

## 2018-02-22 ENCOUNTER — Encounter (HOSPITAL_COMMUNITY)
Admission: RE | Admit: 2018-02-22 | Discharge: 2018-02-22 | Disposition: A | Discharge status: Home / Self Care | Payer: Medicare Other | Source: Ambulatory Visit | Attending: Cardiology | Admitting: Cardiology

## 2018-02-22 DIAGNOSIS — I2102 ST elevation (STEMI) myocardial infarction involving left anterior descending coronary artery: Secondary | ICD-10-CM

## 2018-02-22 DIAGNOSIS — Z955 Presence of coronary angioplasty implant and graft: Secondary | ICD-10-CM | POA: Diagnosis not present

## 2018-02-22 DIAGNOSIS — I10 Essential (primary) hypertension: Secondary | ICD-10-CM | POA: Diagnosis not present

## 2018-02-22 DIAGNOSIS — I2510 Atherosclerotic heart disease of native coronary artery without angina pectoris: Secondary | ICD-10-CM | POA: Diagnosis not present

## 2018-02-22 DIAGNOSIS — E7800 Pure hypercholesterolemia, unspecified: Secondary | ICD-10-CM | POA: Diagnosis not present

## 2018-02-22 DIAGNOSIS — Z8673 Personal history of transient ischemic attack (TIA), and cerebral infarction without residual deficits: Secondary | ICD-10-CM | POA: Diagnosis not present

## 2018-02-22 DIAGNOSIS — M1711 Unilateral primary osteoarthritis, right knee: Secondary | ICD-10-CM | POA: Diagnosis not present

## 2018-02-24 ENCOUNTER — Encounter (HOSPITAL_COMMUNITY)
Admission: RE | Admit: 2018-02-24 | Discharge: 2018-02-24 | Disposition: A | Discharge status: Home / Self Care | Payer: Medicare Other | Source: Ambulatory Visit | Attending: Cardiology | Admitting: Cardiology

## 2018-02-24 ENCOUNTER — Encounter (HOSPITAL_COMMUNITY): Payer: Medicare Other

## 2018-02-24 DIAGNOSIS — Z8673 Personal history of transient ischemic attack (TIA), and cerebral infarction without residual deficits: Secondary | ICD-10-CM | POA: Diagnosis not present

## 2018-02-24 DIAGNOSIS — E7800 Pure hypercholesterolemia, unspecified: Secondary | ICD-10-CM | POA: Diagnosis not present

## 2018-02-24 DIAGNOSIS — I2510 Atherosclerotic heart disease of native coronary artery without angina pectoris: Secondary | ICD-10-CM | POA: Diagnosis not present

## 2018-02-24 DIAGNOSIS — Z955 Presence of coronary angioplasty implant and graft: Secondary | ICD-10-CM

## 2018-02-24 DIAGNOSIS — I10 Essential (primary) hypertension: Secondary | ICD-10-CM | POA: Diagnosis not present

## 2018-02-24 DIAGNOSIS — I2102 ST elevation (STEMI) myocardial infarction involving left anterior descending coronary artery: Secondary | ICD-10-CM | POA: Diagnosis not present

## 2018-02-24 NOTE — Progress Notes (Signed)
Boen Sterbenz 84 y.o. male Nutrition Note Spoke with pt. Nutrition plan and goals reviewed with pt. Pt is following a heart healthy diet. Pt wants to lose wt. Pt continues to avoid eating out, cooks at home, eliminated fried foods, watches out for sodium, consumes lean protein, eating some complex carbs, and watches his portion sizes. Additional weight loss tips reviewed. Pt is pre-diabetic. Last A1c 5.7. Recommended consistent carbohydrate heart healthy diet. Educated patient on complex carbs vs refined carbohydrates and discussed how to replace commonly eaten items. Per discussion, pt does not use canned/convenience foods often. Pt rarely adds salt to food. Pt eats out infrequently. Pt expressed understanding of the information reviewed. Pt aware of nutrition education classes offered and plans on attending nutrition classes.  Lab Results  Component Value Date   HGBA1C 5.7 (H) 11/30/2017    Wt Readings from Last 3 Encounters:  02/04/18 179 lb 7.3 oz (81.4 kg)  01/22/18 180 lb 9.6 oz (81.9 kg)  12/18/17 185 lb (83.9 kg)    Nutrition Diagnosis  ? Overweight related to excessive energy intake as evidenced by a BMI of 29.86 kg/m2  Nutrition Intervention ? Pt's individual nutrition plan reviewed with pt. ? Benefits of adopting Heart Healthy diet discussed when Medficts reviewed.  ? Continue client-centered nutrition education by RD, as part of interdisciplinary care.   Goal(s)  Pt to identify and limit food sources of saturated fat, trans fat, sodium, refined carbohydrates  Pt to identify food quantities necessary to achieve weight loss of 6-24 lbs. at graduation from cardiac rehab. Goal wt of 170 lb desired.   Pt able to name foods that affect blood glucose.  Plan:   Pt to attend nutrition classes ? Nutrition I ? Nutrition II ? Portion Distortion   Will provide client-centered nutrition education as part of interdisciplinary care  Monitor and evaluate progress toward nutrition  goal with team.    Laurina Bustle, MS, RD, LDN 02/24/2018 10:55 AM

## 2018-02-25 DIAGNOSIS — I480 Paroxysmal atrial fibrillation: Secondary | ICD-10-CM | POA: Diagnosis not present

## 2018-02-25 DIAGNOSIS — Z0000 Encounter for general adult medical examination without abnormal findings: Secondary | ICD-10-CM | POA: Diagnosis not present

## 2018-02-25 DIAGNOSIS — I700 Atherosclerosis of aorta: Secondary | ICD-10-CM | POA: Diagnosis not present

## 2018-02-25 DIAGNOSIS — I129 Hypertensive chronic kidney disease with stage 1 through stage 4 chronic kidney disease, or unspecified chronic kidney disease: Secondary | ICD-10-CM | POA: Diagnosis not present

## 2018-02-25 DIAGNOSIS — D692 Other nonthrombocytopenic purpura: Secondary | ICD-10-CM | POA: Diagnosis not present

## 2018-02-25 DIAGNOSIS — R195 Other fecal abnormalities: Secondary | ICD-10-CM | POA: Diagnosis not present

## 2018-02-25 DIAGNOSIS — E7800 Pure hypercholesterolemia, unspecified: Secondary | ICD-10-CM | POA: Diagnosis not present

## 2018-02-25 DIAGNOSIS — N183 Chronic kidney disease, stage 3 (moderate): Secondary | ICD-10-CM | POA: Diagnosis not present

## 2018-02-25 DIAGNOSIS — Z79899 Other long term (current) drug therapy: Secondary | ICD-10-CM | POA: Diagnosis not present

## 2018-02-26 ENCOUNTER — Encounter (HOSPITAL_COMMUNITY)
Admission: RE | Admit: 2018-02-26 | Discharge: 2018-02-26 | Disposition: A | Discharge status: Home / Self Care | Payer: Medicare Other | Source: Ambulatory Visit | Attending: Cardiology | Admitting: Cardiology

## 2018-02-26 ENCOUNTER — Encounter (HOSPITAL_COMMUNITY): Payer: Medicare Other

## 2018-02-26 DIAGNOSIS — I2510 Atherosclerotic heart disease of native coronary artery without angina pectoris: Secondary | ICD-10-CM | POA: Diagnosis not present

## 2018-02-26 DIAGNOSIS — I2102 ST elevation (STEMI) myocardial infarction involving left anterior descending coronary artery: Secondary | ICD-10-CM | POA: Diagnosis not present

## 2018-02-26 DIAGNOSIS — Z955 Presence of coronary angioplasty implant and graft: Secondary | ICD-10-CM

## 2018-02-26 DIAGNOSIS — I10 Essential (primary) hypertension: Secondary | ICD-10-CM | POA: Diagnosis not present

## 2018-02-26 DIAGNOSIS — Z8673 Personal history of transient ischemic attack (TIA), and cerebral infarction without residual deficits: Secondary | ICD-10-CM | POA: Diagnosis not present

## 2018-02-26 DIAGNOSIS — E7800 Pure hypercholesterolemia, unspecified: Secondary | ICD-10-CM | POA: Diagnosis not present

## 2018-02-26 NOTE — Progress Notes (Signed)
Reviewed home exercise with pt today.  Pt plans to walk at home around neighborhood 15 minutes twice a day for exercise.  Reviewed THR, pulse, RPE, sign and symptoms, NTG use, and when to call 911 or MD.  Also discussed weather considerations and indoor options.  Pt voiced understanding.

## 2018-03-02 DIAGNOSIS — M1711 Unilateral primary osteoarthritis, right knee: Secondary | ICD-10-CM | POA: Diagnosis not present

## 2018-03-03 ENCOUNTER — Encounter (HOSPITAL_COMMUNITY): Payer: Medicare Other

## 2018-03-03 ENCOUNTER — Encounter (HOSPITAL_COMMUNITY)
Admission: RE | Admit: 2018-03-03 | Discharge: 2018-03-03 | Disposition: A | Discharge status: Home / Self Care | Payer: Medicare Other | Source: Ambulatory Visit | Attending: Cardiology | Admitting: Cardiology

## 2018-03-03 DIAGNOSIS — Z8673 Personal history of transient ischemic attack (TIA), and cerebral infarction without residual deficits: Secondary | ICD-10-CM | POA: Diagnosis not present

## 2018-03-03 DIAGNOSIS — E7800 Pure hypercholesterolemia, unspecified: Secondary | ICD-10-CM | POA: Insufficient documentation

## 2018-03-03 DIAGNOSIS — I2102 ST elevation (STEMI) myocardial infarction involving left anterior descending coronary artery: Secondary | ICD-10-CM | POA: Diagnosis not present

## 2018-03-03 DIAGNOSIS — Z87891 Personal history of nicotine dependence: Secondary | ICD-10-CM | POA: Diagnosis not present

## 2018-03-03 DIAGNOSIS — Z7982 Long term (current) use of aspirin: Secondary | ICD-10-CM | POA: Insufficient documentation

## 2018-03-03 DIAGNOSIS — I213 ST elevation (STEMI) myocardial infarction of unspecified site: Secondary | ICD-10-CM | POA: Diagnosis present

## 2018-03-03 DIAGNOSIS — Z955 Presence of coronary angioplasty implant and graft: Secondary | ICD-10-CM | POA: Diagnosis not present

## 2018-03-03 DIAGNOSIS — I714 Abdominal aortic aneurysm, without rupture: Secondary | ICD-10-CM | POA: Diagnosis not present

## 2018-03-03 DIAGNOSIS — G47419 Narcolepsy without cataplexy: Secondary | ICD-10-CM | POA: Insufficient documentation

## 2018-03-03 DIAGNOSIS — Z7902 Long term (current) use of antithrombotics/antiplatelets: Secondary | ICD-10-CM | POA: Insufficient documentation

## 2018-03-03 DIAGNOSIS — I10 Essential (primary) hypertension: Secondary | ICD-10-CM | POA: Insufficient documentation

## 2018-03-03 DIAGNOSIS — I2510 Atherosclerotic heart disease of native coronary artery without angina pectoris: Secondary | ICD-10-CM | POA: Diagnosis not present

## 2018-03-03 DIAGNOSIS — Z79899 Other long term (current) drug therapy: Secondary | ICD-10-CM | POA: Diagnosis not present

## 2018-03-04 NOTE — Progress Notes (Signed)
Cardiac Individual Treatment Plan  Patient Details  Name: Paul Kramer MRN: [id number] Date of Birth: 12/25/1929 Referring Provider:     [facility] from 02/04/2018 in [place]  Referring Provider  Leonie Man MD       Initial Encounter Date:    CARDIAC REHAB PHASE II ORIENTATION from 02/04/2018 in [place]  Date  02/04/18      Visit Diagnosis: Status post coronary artery stent placement  ST elevation myocardial infarction involving left anterior descending (LAD) coronary artery (HCC)  Patient's Home Medications on Admission:  Current Outpatient Medications:  .  acetaminophen (TYLENOL) 325 MG tablet, Take 325 mg by mouth every 6 (six) hours as needed for mild pain., Disp: , Rfl:  .  allopurinol (ZYLOPRIM) 100 MG tablet, Take 100 mg by mouth every morning., Disp: , Rfl:  .  amiodarone (PACERONE) 100 MG tablet, Take 1 tablet (100 mg total) by mouth daily., Disp: 90 tablet, Rfl: 3 .  aspirin EC 81 MG tablet, Take 81 mg by mouth daily., Disp: , Rfl:  .  atorvastatin (LIPITOR) 40 MG tablet, Take 1 tablet (40 mg total) by mouth daily at 6 PM., Disp: 90 tablet, Rfl: 3 .  clopidogrel (PLAVIX) 75 MG tablet, Take 1 tablet (75 mg total) by mouth daily., Disp: 90 tablet, Rfl: 3 .  cyanocobalamin 1000 MCG tablet, Take 1 tablet (1,000 mcg total) by mouth daily., Disp: 90 tablet, Rfl: 3 .  Multiple Vitamins-Minerals (CENTRUM SILVER PO), Take 1 tablet by mouth daily., Disp: , Rfl:  .  nitroGLYCERIN (NITROSTAT) 0.4 MG SL tablet, Place 1 tablet (0.4 mg total) under the tongue every 5 (five) minutes x 3 doses as needed for chest pain., Disp: 25 tablet, Rfl: 3 .  pantoprazole (PROTONIX) 40 MG tablet, Take 1 tablet (40 mg total) by mouth daily., Disp: 90 tablet, Rfl: 3 .  Psyllium (METAMUCIL FIBER PO), Take by mouth., Disp: , Rfl:  .  terazosin (HYTRIN) 2 MG capsule, Take 4 mg by mouth every evening., Disp: ,  Rfl:  .  VITAMIN D, ERGOCALCIFEROL, PO, Take 5,000 Units by mouth daily. , Disp: , Rfl:   Past Medical History: Past Medical History:  Diagnosis Date  . AAA (abdominal aortic aneurysm) ([place])   . Coronary artery disease   . Hypercholesteremia   . Hypertension   . Myocardial infarction of inferolateral wall ([place]) 11/2017   ? prior Infarct; - Cath 11/29/17 - 3 V CAD (CTO RCA), 95% m-d Cx (@ OM2 55%)-> PCI Cx-PTCA Om2; 85% p-m LAD => DES PCI   . Narcolepsy    pt may pass out when laughing  . Spinal stenosis   . TIA (transient ischemic attack)     Tobacco Use: Social History   Tobacco Use  Smoking Status Former Smoker  Smokeless Tobacco Never Used    Labs: Recent [employer] for ITP Cardiac and Pulmonary Rehab Latest Ref Rng & Units 11/30/2017   Cholestrol 0 - 200 mg/dL 137   LDLCALC 0 - 99 mg/dL 80   HDL >40 mg/dL 38(L)   Trlycerides <150 mg/dL 93   Hemoglobin A1c 4.8 - 5.6 % 5.7(H)      Capillary Blood Glucose: Lab Results  Component Value Date   GLUCAP 91 12/03/2017     Exercise Target Goals: Exercise Program Goal: Individual exercise prescription set using results from initial 6 min walk test and THRR while  considering  patient's activity barriers and safety.   Exercise Prescription Goal: Initial exercise prescription builds to 30-45 minutes a day of aerobic activity, 2-3 days per week.  Home exercise guidelines will be given to patient during program as part of exercise prescription that the participant will acknowledge.  Activity Barriers & Risk Stratification: Activity Barriers & Cardiac Risk Stratification - 02/04/18 1230      Activity Barriers & Cardiac Risk Stratification   Activity Barriers  Back Problems;Other (comment)    Comments  Chronic Right Knee Pain " No Cartilage", [place]: may pass out from laughing     Cardiac Risk Stratification  High       6 Minute Walk: 6 Minute Walk    Row Name 02/04/18 1212         6 Minute Walk    Phase  Initial     Distance  1459 feet     Walk Time  6 minutes     # of Rest Breaks  0     MPH  2.76     METS  1.86     RPE  9     Perceived Dyspnea   0     VO2 Peak  6.49     Symptoms  No     Resting HR  56 bpm     Resting BP  122/60     Resting Oxygen Saturation   97 %     Exercise Oxygen Saturation  during 6 min walk  98 %     Max Ex. HR  62 bpm     Max Ex. BP  138/80     2 Minute Post BP  138/78        Oxygen Initial Assessment:   Oxygen Re-Evaluation:   Oxygen Discharge (Final Oxygen Re-Evaluation):   Initial Exercise Prescription: Initial Exercise Prescription - 02/04/18 1200      Date of Initial Exercise RX and Referring Provider   Date  02/04/18    Referring Provider  Leonie Man MD     Expected Discharge Date  04/30/18      NuStep   Level  2    SPM  75    Minutes  15    METs  1.8      Track   Laps  12    Minutes  10    METs  2.39      Prescription Details   Frequency (times per week)  3x    Duration  Progress to 30 minutes of continuous aerobic without signs/symptoms of physical distress      Intensity   THRR 40-80% of Max Heartrate  54-108    Ratings of Perceived Exertion  11-13    Perceived Dyspnea  0-4      Progression   Progression  Continue progressive overload as per policy without signs/symptoms or physical distress.      Resistance Training   Training Prescription  Yes    Weight  3lbs    Reps  10-15       Perform Capillary Blood Glucose checks as needed.  Exercise Prescription Changes: Exercise Prescription Changes    Row Name 02/08/18 0958 02/22/18 0954           Response to Exercise   Blood Pressure (Admit)  118/60  116/70      Blood Pressure (Exercise)  138/72  132/62      Blood Pressure (Exit)  130/70  122/60  Heart Rate (Admit)  62 bpm  72 bpm      Heart Rate (Exercise)  99 bpm  81 bpm      Heart Rate (Exit)  55 bpm  62 bpm      Rating of Perceived Exertion (Exercise)  11  12      Symptoms  none   none      Duration  Progress to 30 minutes of  aerobic without signs/symptoms of physical distress  Progress to 30 minutes of  aerobic without signs/symptoms of physical distress      Intensity  THRR unchanged  THRR unchanged        Progression   Progression  Continue to progress workloads to maintain intensity without signs/symptoms of physical distress.  Continue to progress workloads to maintain intensity without signs/symptoms of physical distress.      Average METs  2.4  2        Resistance Training   Training Prescription  Yes  Yes      Weight  3lbs  3lbs      Reps  10-15  10-15      Time  10 Minutes  10 Minutes        Interval Training   Interval Training  No  No        NuStep   Level  2  2      SPM  85  85      Minutes  15  15      METs  2.6  2.6        Arm Ergometer   Level  -  1      Minutes  -  15        Track   Laps  10  -      Minutes  15  -      METs  2.16  -         Exercise Comments: Exercise Comments    Row Name 02/08/18 1047 02/22/18 1030 02/26/18 1017       Exercise Comments  Patient tolerated 1st session of exercie well without c/o.  Reviewed METs and goals with patient.  Home exercise completed        Exercise Goals and Review: Exercise Goals    Row Name 02/04/18 1214             Exercise Goals   Increase Physical Activity  Yes       Intervention  Provide advice, education, support and counseling about physical activity/exercise needs.;Develop an individualized exercise prescription for aerobic and resistive training based on initial evaluation findings, risk stratification, comorbidities and participant's personal goals.       Expected Outcomes  Short Term: Attend rehab on a regular basis to increase amount of physical activity.;Long Term: Add in home exercise to make exercise part of routine and to increase amount of physical activity.;Long Term: Exercising regularly at least 3-5 days a week.       Increase Strength and Stamina  Yes        Intervention  Provide advice, education, support and counseling about physical activity/exercise needs.;Develop an individualized exercise prescription for aerobic and resistive training based on initial evaluation findings, risk stratification, comorbidities and participant's personal goals.       Expected Outcomes  Short Term: Perform resistance training exercises routinely during rehab and add in resistance training at home;Short Term: Increase workloads from initial exercise prescription for resistance, speed, and METs.;Long Term: Improve cardiorespiratory  fitness, muscular endurance and strength as measured by increased METs and functional capacity (6MWT)       Able to understand and use rate of perceived exertion (RPE) scale  Yes       Intervention  Provide education and explanation on how to use RPE scale       Expected Outcomes  Short Term: Able to use RPE daily in rehab to express subjective intensity level;Long Term:  Able to use RPE to guide intensity level when exercising independently       Knowledge and understanding of Target Heart Rate Range (THRR)  Yes       Intervention  Provide education and explanation of THRR including how the numbers were predicted and where they are located for reference       Expected Outcomes  Short Term: Able to state/look up THRR;Short Term: Able to use daily as guideline for intensity in rehab;Long Term: Able to use THRR to govern intensity when exercising independently       Able to check pulse independently  Yes       Intervention  Provide education and demonstration on how to check pulse in carotid and radial arteries.;Review the importance of being able to check your own pulse for safety during independent exercise       Expected Outcomes  Short Term: Able to explain why pulse checking is important during independent exercise;Long Term: Able to check pulse independently and accurately       Understanding of Exercise Prescription  Yes       Intervention   Provide education, explanation, and written materials on patient's individual exercise prescription       Expected Outcomes  Short Term: Able to explain program exercise prescription;Long Term: Able to explain home exercise prescription to exercise independently          Exercise Goals Re-Evaluation : Exercise Goals Re-Evaluation    Row Name 02/08/18 1047 02/22/18 1030           Exercise Goal Re-Evaluation   Exercise Goals Review  Able to understand and use rate of perceived exertion (RPE) scale  Increase Physical Activity      Comments  Patient able to understand and use RPE scale sppropriately.  Patient states that he's walking 0.6 miles twice/day, 4 days/ week in addition to exercise at cardiac rehab.       Expected Outcomes  Increase workloads as tolerated.  Continue to progress workloads to help improve strength and stamina.         Discharge Exercise Prescription (Final Exercise Prescription Changes): Exercise Prescription Changes - 02/22/18 0954      Response to Exercise   Blood Pressure (Admit)  116/70    Blood Pressure (Exercise)  132/62    Blood Pressure (Exit)  122/60    Heart Rate (Admit)  72 bpm    Heart Rate (Exercise)  81 bpm    Heart Rate (Exit)  62 bpm    Rating of Perceived Exertion (Exercise)  12    Symptoms  none    Duration  Progress to 30 minutes of  aerobic without signs/symptoms of physical distress    Intensity  THRR unchanged      Progression   Progression  Continue to progress workloads to maintain intensity without signs/symptoms of physical distress.    Average METs  2      Resistance Training   Training Prescription  Yes    Weight  3lbs    Reps  10-15  Time  10 Minutes      Interval Training   Interval Training  No      NuStep   Level  2    SPM  85    Minutes  15    METs  2.6      Arm Ergometer   Level  1    Minutes  15      Track   Laps  --    Minutes  --    METs  --       Nutrition:  Target Goals: Understanding of  nutrition guidelines, daily intake of sodium 1500mg , cholesterol 200mg , calories 30% from fat and 7% or less from saturated fats, daily to have 5 or more servings of fruits and vegetables.  Biometrics: Pre Biometrics - 02/04/18 1214      Pre Biometrics   Height  5\' 5"  (1.651 m)    Weight  81.4 kg    Waist Circumference  41 inches    Hip Circumference  42 inches    Waist to Hip Ratio  0.98 %    BMI (Calculated)  29.86    Triceps Skinfold  26 mm    % Body Fat  32.1 %    Grip Strength  33 kg    Flexibility  12.5 in    Single Leg Stand  0 seconds        Nutrition Therapy Plan and Nutrition Goals: Nutrition Therapy & Goals - 02/04/18 1035      Nutrition Therapy   Diet  consistent carbohydrate heart healthy      Personal Nutrition Goals   Nutrition Goal  Pt to identify and limit food sources of saturated fat, trans fat, sodium, refined carbohydrates    Personal Goal #2  Pt to identify food quantities necessary to achieve weight loss of 6-24 lbs. at graduation from cardiac rehab.    Personal Goal #3  Pt able to name foods that affect blood glucose.      Intervention Plan   Intervention  Prescribe, educate and counsel regarding individualized specific dietary modifications aiming towards targeted core components such as weight, hypertension, lipid management, diabetes, heart failure and other comorbidities.    Expected Outcomes  Short Term Goal: Understand basic principles of dietary content, such as calories, fat, sodium, cholesterol and nutrients.       Nutrition Assessments: Nutrition Assessments - 02/04/18 1036      MEDFICTS Scores   Pre Score  12       Nutrition Goals Re-Evaluation: Nutrition Goals Re-Evaluation    [place] Name 02/04/18 1035             Goals   Current Weight  179 lb 7.3 oz (81.4 kg)          Nutrition Goals Re-Evaluation: Nutrition Goals Re-Evaluation    [place] Name 02/04/18 1035             Goals   Current Weight  179 lb 7.3 oz (81.4 kg)           Nutrition Goals Discharge (Final Nutrition Goals Re-Evaluation): Nutrition Goals Re-Evaluation - 02/04/18 1035      Goals   Current Weight  179 lb 7.3 oz (81.4 kg)       Psychosocial: Target Goals: Acknowledge presence or absence of significant depression and/or stress, maximize coping skills, provide positive support system. Participant is able to verbalize types and ability to use techniques and skills needed for reducing stress and depression.  Initial Review &  Psychosocial Screening: Initial Psych Review & Screening - 02/04/18 1038      Initial Review   Current issues with  None Identified      Family Dynamics   Good Support System?  Yes   Paul Kramer lists his daughter and son-in-law as sources of support.  His daughter was present for orientation.      Barriers   Psychosocial barriers to participate in program  There are no identifiable barriers or psychosocial needs.      Screening Interventions   Interventions  Encouraged to exercise       Quality of Life Scores: Quality of Life - 02/04/18 1045      Quality of Life   Select  Quality of Life      Quality of Life Scores   Health/Function Pre  27.63 %    Socioeconomic Pre  30 %    Psych/Spiritual Pre  30 %    Family Pre  30 %    GLOBAL Pre  28.89 %      Scores of 19 and below usually indicate a poorer quality of life in these areas.  A difference of  2-3 points is a clinically meaningful difference.  A difference of 2-3 points in the total score of the Quality of Life Index has been associated with significant improvement in overall quality of life, self-image, physical symptoms, and general health in studies assessing change in quality of life.  PHQ-9: Recent Review Flowsheet Data    Depression screen [facility] 2/9 02/08/2018   Decreased Interest 0   Down, Depressed, Hopeless 0   PHQ - 2 Score 0     Interpretation of Total Score  Total Score Depression Severity:  1-4 = Minimal depression, 5-9 = Mild  depression, 10-14 = Moderate depression, 15-19 = Moderately severe depression, 20-27 = Severe depression   Psychosocial Evaluation and Intervention:   Psychosocial Re-Evaluation: Psychosocial Re-Evaluation    [place] Name 03/04/18 1417             Psychosocial Re-Evaluation   Current issues with  None Identified       Interventions  Encouraged to attend Cardiac Rehabilitation for the exercise       Continue Psychosocial Services   No Follow up required          Psychosocial Discharge (Final Psychosocial Re-Evaluation): Psychosocial Re-Evaluation - 03/04/18 1417      Psychosocial Re-Evaluation   Current issues with  None Identified    Interventions  Encouraged to attend Cardiac Rehabilitation for the exercise    Continue Psychosocial Services   No Follow up required       Vocational Rehabilitation: Provide vocational rehab assistance to qualifying candidates.   Vocational Rehab Evaluation & Intervention: Vocational Rehab - 02/04/18 1038      Initial Vocational Rehab Evaluation & Intervention   Assessment shows need for Vocational Rehabilitation  No       Education: Education Goals: Education classes will be provided on a weekly basis, covering required topics. Participant will state understanding/return demonstration of topics presented.  Learning Barriers/Preferences: Learning Barriers/Preferences - 02/04/18 1227      Learning Barriers/Preferences   Learning Barriers  Sight;Hearing    Learning Preferences  Written Material;Video;Skilled Demonstration       Education Topics: Count Your Pulse:  -Group instruction provided by verbal instruction, demonstration, patient participation and written materials to support subject.  Instructors address importance of being able to find your pulse and how to count your pulse when at home  without a heart monitor.  Patients get hands on experience counting their pulse with staff help and individually.   Heart Attack, Angina, and  Risk Factor Modification:  -Group instruction provided by verbal instruction, video, and written materials to support subject.  Instructors address signs and symptoms of angina and heart attacks.    Also discuss risk factors for heart disease and how to make changes to improve heart health risk factors.   Functional Fitness:  -Group instruction provided by verbal instruction, demonstration, patient participation, and written materials to support subject.  Instructors address safety measures for doing things around the house.  Discuss how to get up and down off the floor, how to pick things up properly, how to safely get out of a chair without assistance, and balance training.   Meditation and Mindfulness:  -Group instruction provided by verbal instruction, patient participation, and written materials to support subject.  Instructor addresses importance of mindfulness and meditation practice to help reduce stress and improve awareness.  Instructor also leads participants through a meditation exercise.    Stretching for Flexibility and Mobility:  -Group instruction provided by verbal instruction, patient participation, and written materials to support subject.  Instructors lead participants through series of stretches that are designed to increase flexibility thus improving mobility.  These stretches are additional exercise for major muscle groups that are typically performed during regular warm up and cool down.   Hands Only CPR:  -Group verbal, video, and participation provides a basic overview of AHA guidelines for community CPR. Role-play of emergencies allow participants the opportunity to practice calling for help and chest compression technique with discussion of AED use.   Hypertension: -Group verbal and written instruction that provides a basic overview of hypertension including the most recent diagnostic guidelines, risk factor reduction with self-care instructions and medication  management.    Nutrition I class: Heart Healthy Eating:  -Group instruction provided by PowerPoint slides, verbal discussion, and written materials to support subject matter. The instructor gives an explanation and review of the Therapeutic Lifestyle Changes diet recommendations, which includes a discussion on lipid goals, dietary fat, sodium, fiber, plant stanol/sterol esters, sugar, and the components of a well-balanced, healthy diet.   Nutrition II class: Lifestyle Skills:  -Group instruction provided by PowerPoint slides, verbal discussion, and written materials to support subject matter. The instructor gives an explanation and review of label reading, grocery shopping for heart health, heart healthy recipe modifications, and ways to make healthier choices when eating out.   Diabetes Question & Answer:  -Group instruction provided by PowerPoint slides, verbal discussion, and written materials to support subject matter. The instructor gives an explanation and review of diabetes co-morbidities, pre- and post-prandial blood glucose goals, pre-exercise blood glucose goals, signs, symptoms, and treatment of hypoglycemia and hyperglycemia, and foot care basics.   Diabetes Blitz:  -Group instruction provided by PowerPoint slides, verbal discussion, and written materials to support subject matter. The instructor gives an explanation and review of the physiology behind type 1 and type 2 diabetes, diabetes medications and rational behind using different medications, pre- and post-prandial blood glucose recommendations and Hemoglobin A1c goals, diabetes diet, and exercise including blood glucose guidelines for exercising safely.    Portion Distortion:  -Group instruction provided by PowerPoint slides, verbal discussion, written materials, and food models to support subject matter. The instructor gives an explanation of serving size versus portion size, changes in portions sizes over the last 20 years,  and what consists of a serving from each food  group.   Stress Management:  -Group instruction provided by verbal instruction, video, and written materials to support subject matter.  Instructors review role of stress in heart disease and how to cope with stress positively.     Exercising on Your Own:  -Group instruction provided by verbal instruction, power point, and written materials to support subject.  Instructors discuss benefits of exercise, components of exercise, frequency and intensity of exercise, and end points for exercise.  Also discuss use of nitroglycerin and activating EMS.  Review options of places to exercise outside of rehab.  Review guidelines for sex with heart disease.   Cardiac Drugs I:  -Group instruction provided by verbal instruction and written materials to support subject.  Instructor reviews cardiac drug classes: antiplatelets, anticoagulants, beta blockers, and statins.  Instructor discusses reasons, side effects, and lifestyle considerations for each drug class.   Cardiac Drugs II:  -Group instruction provided by verbal instruction and written materials to support subject.  Instructor reviews cardiac drug classes: angiotensin converting enzyme inhibitors (ACE-I), angiotensin II receptor blockers (ARBs), nitrates, and calcium channel blockers.  Instructor discusses reasons, side effects, and lifestyle considerations for each drug class.   Anatomy and Physiology of the Circulatory System:  Group verbal and written instruction and models provide basic cardiac anatomy and physiology, with the coronary electrical and arterial systems. Review of: AMI, Angina, Valve disease, Heart Failure, Peripheral Artery Disease, Cardiac Arrhythmia, Pacemakers, and the ICD.   Other Education:  -Group or individual verbal, written, or video instructions that support the educational goals of the cardiac rehab program.   Holiday Eating Survival Tips:  -Group instruction provided by  PowerPoint slides, verbal discussion, and written materials to support subject matter. The instructor gives patients tips, tricks, and techniques to help them not only survive but enjoy the holidays despite the onslaught of food that accompanies the holidays.   Knowledge Questionnaire Score: Knowledge Questionnaire Score - 02/04/18 1038      Knowledge Questionnaire Score   Pre Score  20/24       Core Components/Risk Factors/Patient Goals at Admission: Personal Goals and Risk Factors at Admission - 02/04/18 1228      Core Components/Risk Factors/Patient Goals on Admission    Weight Management  Yes    Intervention  Weight Management: Develop a combined nutrition and exercise program designed to reach desired caloric intake, while maintaining appropriate intake of nutrient and fiber, sodium and fats, and appropriate energy expenditure required for the weight goal.;Weight Management: Provide education and appropriate resources to help participant work on and attain dietary goals.;Weight Management/Obesity: Establish reasonable short term and long term weight goals.    Admit Weight  179 lb 7.3 oz (81.4 kg)    Goal Weight: Short Term  175 lb (79.4 kg)    Goal Weight: Long Term  169 lb (76.7 kg)    Expected Outcomes  Short Term: Continue to assess and modify interventions until short term weight is achieved;Long Term: Adherence to nutrition and physical activity/exercise program aimed toward attainment of established weight goal;Weight Loss: Understanding of general recommendations for a balanced deficit meal plan, which promotes 1-2 lb weight loss per week and includes a negative energy balance of [id number] kcal/d;Understanding recommendations for meals to include 15-35% energy as protein, 25-35% energy from fat, 35-60% energy from carbohydrates, less than 200mg  of dietary cholesterol, 20-35 gm of total fiber daily;Understanding of distribution of calorie intake throughout the day with the consumption  of 4-5 meals/snacks    Hypertension  Yes  Intervention  Provide education on lifestyle modifcations including regular physical activity/exercise, weight management, moderate sodium restriction and increased consumption of fresh fruit, vegetables, and low fat dairy, alcohol moderation, and smoking cessation.;Monitor prescription use compliance.    Expected Outcomes  Short Term: Continued assessment and intervention until BP is < 140/35mm HG in hypertensive participants. < 130/64mm HG in hypertensive participants with diabetes, heart failure or chronic kidney disease.;Long Term: Maintenance of blood pressure at goal levels.    Lipids  Yes    Intervention  Provide education and support for participant on nutrition & aerobic/resistive exercise along with prescribed medications to achieve LDL 70mg , HDL >40mg .    Expected Outcomes  Short Term: Participant states understanding of desired cholesterol values and is compliant with medications prescribed. Participant is following exercise prescription and nutrition guidelines.;Long Term: Cholesterol controlled with medications as prescribed, with individualized exercise RX and with personalized nutrition plan. Value goals: LDL < 70mg , HDL > 40 mg.       Core Components/Risk Factors/Patient Goals Review:  Goals and Risk Factor Review    Row Name 03/04/18 1418             Core Components/Risk Factors/Patient Goals Review   Personal Goals Review  Hypertension;Lipids       Review  Paul Kramer has been doing well with exercise at cardiac rehab. Paul Kramer's vital signs have been stable. Paul Kramer has maintained his current weight.       Expected Outcomes  Patient will continue to participate in exercise, nutrition and lifestyle modification opportunities          Core Components/Risk Factors/Patient Goals at Discharge (Final Review):  Goals and Risk Factor Review - 03/04/18 1418      Core Components/Risk Factors/Patient Goals Review   Personal Goals Review   Hypertension;Lipids    Review  Paul Kramer has been doing well with exercise at cardiac rehab. Paul Kramer's vital signs have been stable. Paul Kramer has maintained his current weight.    Expected Outcomes  Patient will continue to participate in exercise, nutrition and lifestyle modification opportunities       ITP Comments: ITP Comments    Row Name 02/04/18 1210 03/04/18 1416         ITP Comments  Dr. Fransico Him, Medical Director   30 Day ITP Review. Patient with good participation and attendance in phase 2 cardiac rehab         Comments: See ITP comments.Barnet Pall, RN,BSN 03/04/2018 2:23 PM

## 2018-03-05 ENCOUNTER — Encounter (HOSPITAL_COMMUNITY): Payer: Medicare Other

## 2018-03-05 ENCOUNTER — Encounter (HOSPITAL_COMMUNITY)
Admission: RE | Admit: 2018-03-05 | Discharge: 2018-03-05 | Disposition: A | Discharge status: Home / Self Care | Payer: Medicare Other | Source: Ambulatory Visit | Attending: Cardiology | Admitting: Cardiology

## 2018-03-05 DIAGNOSIS — I2510 Atherosclerotic heart disease of native coronary artery without angina pectoris: Secondary | ICD-10-CM | POA: Diagnosis not present

## 2018-03-05 DIAGNOSIS — I10 Essential (primary) hypertension: Secondary | ICD-10-CM | POA: Diagnosis not present

## 2018-03-05 DIAGNOSIS — E7800 Pure hypercholesterolemia, unspecified: Secondary | ICD-10-CM | POA: Diagnosis not present

## 2018-03-05 DIAGNOSIS — Z955 Presence of coronary angioplasty implant and graft: Secondary | ICD-10-CM | POA: Diagnosis not present

## 2018-03-05 DIAGNOSIS — I2102 ST elevation (STEMI) myocardial infarction involving left anterior descending coronary artery: Secondary | ICD-10-CM | POA: Diagnosis not present

## 2018-03-05 DIAGNOSIS — Z8673 Personal history of transient ischemic attack (TIA), and cerebral infarction without residual deficits: Secondary | ICD-10-CM | POA: Diagnosis not present

## 2018-03-08 ENCOUNTER — Encounter (HOSPITAL_COMMUNITY)
Admission: RE | Admit: 2018-03-08 | Discharge: 2018-03-08 | Disposition: A | Discharge status: Home / Self Care | Payer: Medicare Other | Source: Ambulatory Visit | Attending: Cardiology | Admitting: Cardiology

## 2018-03-08 ENCOUNTER — Encounter (HOSPITAL_COMMUNITY): Payer: Medicare Other

## 2018-03-08 DIAGNOSIS — E7800 Pure hypercholesterolemia, unspecified: Secondary | ICD-10-CM | POA: Diagnosis not present

## 2018-03-08 DIAGNOSIS — Z8673 Personal history of transient ischemic attack (TIA), and cerebral infarction without residual deficits: Secondary | ICD-10-CM | POA: Diagnosis not present

## 2018-03-08 DIAGNOSIS — I2510 Atherosclerotic heart disease of native coronary artery without angina pectoris: Secondary | ICD-10-CM | POA: Diagnosis not present

## 2018-03-08 DIAGNOSIS — I10 Essential (primary) hypertension: Secondary | ICD-10-CM | POA: Diagnosis not present

## 2018-03-08 DIAGNOSIS — Z955 Presence of coronary angioplasty implant and graft: Secondary | ICD-10-CM

## 2018-03-08 DIAGNOSIS — I2102 ST elevation (STEMI) myocardial infarction involving left anterior descending coronary artery: Secondary | ICD-10-CM | POA: Diagnosis not present

## 2018-03-08 DIAGNOSIS — M1711 Unilateral primary osteoarthritis, right knee: Secondary | ICD-10-CM | POA: Diagnosis not present

## 2018-03-10 ENCOUNTER — Encounter (HOSPITAL_COMMUNITY)
Admission: RE | Admit: 2018-03-10 | Discharge: 2018-03-10 | Disposition: A | Discharge status: Home / Self Care | Payer: Medicare Other | Source: Ambulatory Visit | Attending: Cardiology | Admitting: Cardiology

## 2018-03-10 ENCOUNTER — Encounter (HOSPITAL_COMMUNITY): Payer: Medicare Other

## 2018-03-10 DIAGNOSIS — I10 Essential (primary) hypertension: Secondary | ICD-10-CM | POA: Diagnosis not present

## 2018-03-10 DIAGNOSIS — I2102 ST elevation (STEMI) myocardial infarction involving left anterior descending coronary artery: Secondary | ICD-10-CM | POA: Diagnosis not present

## 2018-03-10 DIAGNOSIS — I2510 Atherosclerotic heart disease of native coronary artery without angina pectoris: Secondary | ICD-10-CM | POA: Diagnosis not present

## 2018-03-10 DIAGNOSIS — E7800 Pure hypercholesterolemia, unspecified: Secondary | ICD-10-CM | POA: Diagnosis not present

## 2018-03-10 DIAGNOSIS — Z955 Presence of coronary angioplasty implant and graft: Secondary | ICD-10-CM | POA: Diagnosis not present

## 2018-03-10 DIAGNOSIS — Z8673 Personal history of transient ischemic attack (TIA), and cerebral infarction without residual deficits: Secondary | ICD-10-CM | POA: Diagnosis not present

## 2018-03-10 NOTE — Progress Notes (Signed)
Paul Kramer wants to know when he can start swimming again will send message to Dr Ellyn Hack to inquire. Paul Kramer continues to do well with exercise. Vital signs have been stable.Barnet Pall, RN,BSN 03/10/2018 11:16 AM

## 2018-03-12 ENCOUNTER — Encounter (HOSPITAL_COMMUNITY): Payer: Medicare Other

## 2018-03-12 ENCOUNTER — Encounter (HOSPITAL_COMMUNITY)
Admission: RE | Admit: 2018-03-12 | Discharge: 2018-03-12 | Disposition: A | Discharge status: Home / Self Care | Payer: Medicare Other | Source: Ambulatory Visit | Attending: Cardiology | Admitting: Cardiology

## 2018-03-12 DIAGNOSIS — Z8673 Personal history of transient ischemic attack (TIA), and cerebral infarction without residual deficits: Secondary | ICD-10-CM | POA: Diagnosis not present

## 2018-03-12 DIAGNOSIS — E7800 Pure hypercholesterolemia, unspecified: Secondary | ICD-10-CM | POA: Diagnosis not present

## 2018-03-12 DIAGNOSIS — I2102 ST elevation (STEMI) myocardial infarction involving left anterior descending coronary artery: Secondary | ICD-10-CM

## 2018-03-12 DIAGNOSIS — Z955 Presence of coronary angioplasty implant and graft: Secondary | ICD-10-CM | POA: Diagnosis not present

## 2018-03-12 DIAGNOSIS — I2510 Atherosclerotic heart disease of native coronary artery without angina pectoris: Secondary | ICD-10-CM | POA: Diagnosis not present

## 2018-03-12 DIAGNOSIS — I10 Essential (primary) hypertension: Secondary | ICD-10-CM | POA: Diagnosis not present

## 2018-03-15 ENCOUNTER — Encounter (HOSPITAL_COMMUNITY): Payer: Medicare Other

## 2018-03-15 ENCOUNTER — Encounter (HOSPITAL_COMMUNITY)
Admission: RE | Admit: 2018-03-15 | Discharge: 2018-03-15 | Disposition: A | Discharge status: Home / Self Care | Payer: Medicare Other | Source: Ambulatory Visit | Attending: Cardiology | Admitting: Cardiology

## 2018-03-15 DIAGNOSIS — I2510 Atherosclerotic heart disease of native coronary artery without angina pectoris: Secondary | ICD-10-CM | POA: Diagnosis not present

## 2018-03-15 DIAGNOSIS — I10 Essential (primary) hypertension: Secondary | ICD-10-CM | POA: Diagnosis not present

## 2018-03-15 DIAGNOSIS — Z8673 Personal history of transient ischemic attack (TIA), and cerebral infarction without residual deficits: Secondary | ICD-10-CM | POA: Diagnosis not present

## 2018-03-15 DIAGNOSIS — Z955 Presence of coronary angioplasty implant and graft: Secondary | ICD-10-CM | POA: Diagnosis not present

## 2018-03-15 DIAGNOSIS — I2102 ST elevation (STEMI) myocardial infarction involving left anterior descending coronary artery: Secondary | ICD-10-CM

## 2018-03-15 DIAGNOSIS — E7800 Pure hypercholesterolemia, unspecified: Secondary | ICD-10-CM | POA: Diagnosis not present

## 2018-03-17 ENCOUNTER — Encounter (HOSPITAL_COMMUNITY): Payer: Medicare Other

## 2018-03-17 ENCOUNTER — Encounter (HOSPITAL_COMMUNITY)
Admission: RE | Admit: 2018-03-17 | Discharge: 2018-03-17 | Disposition: A | Discharge status: Home / Self Care | Payer: Medicare Other | Source: Ambulatory Visit | Attending: Cardiology | Admitting: Cardiology

## 2018-03-17 DIAGNOSIS — Z955 Presence of coronary angioplasty implant and graft: Secondary | ICD-10-CM

## 2018-03-17 DIAGNOSIS — I2510 Atherosclerotic heart disease of native coronary artery without angina pectoris: Secondary | ICD-10-CM | POA: Diagnosis not present

## 2018-03-17 DIAGNOSIS — E7800 Pure hypercholesterolemia, unspecified: Secondary | ICD-10-CM | POA: Diagnosis not present

## 2018-03-17 DIAGNOSIS — Z8673 Personal history of transient ischemic attack (TIA), and cerebral infarction without residual deficits: Secondary | ICD-10-CM | POA: Diagnosis not present

## 2018-03-17 DIAGNOSIS — I2102 ST elevation (STEMI) myocardial infarction involving left anterior descending coronary artery: Secondary | ICD-10-CM

## 2018-03-17 DIAGNOSIS — I10 Essential (primary) hypertension: Secondary | ICD-10-CM | POA: Diagnosis not present

## 2018-03-19 ENCOUNTER — Encounter (HOSPITAL_COMMUNITY): Payer: Medicare Other

## 2018-03-19 ENCOUNTER — Encounter (HOSPITAL_COMMUNITY)
Admission: RE | Admit: 2018-03-19 | Discharge: 2018-03-19 | Disposition: A | Discharge status: Home / Self Care | Payer: Medicare Other | Source: Ambulatory Visit | Attending: Cardiology | Admitting: Cardiology

## 2018-03-19 DIAGNOSIS — I10 Essential (primary) hypertension: Secondary | ICD-10-CM | POA: Diagnosis not present

## 2018-03-19 DIAGNOSIS — Z955 Presence of coronary angioplasty implant and graft: Secondary | ICD-10-CM | POA: Diagnosis not present

## 2018-03-19 DIAGNOSIS — E7800 Pure hypercholesterolemia, unspecified: Secondary | ICD-10-CM | POA: Diagnosis not present

## 2018-03-19 DIAGNOSIS — I2102 ST elevation (STEMI) myocardial infarction involving left anterior descending coronary artery: Secondary | ICD-10-CM

## 2018-03-19 DIAGNOSIS — Z8673 Personal history of transient ischemic attack (TIA), and cerebral infarction without residual deficits: Secondary | ICD-10-CM | POA: Diagnosis not present

## 2018-03-19 DIAGNOSIS — I2510 Atherosclerotic heart disease of native coronary artery without angina pectoris: Secondary | ICD-10-CM | POA: Diagnosis not present

## 2018-03-22 ENCOUNTER — Encounter (HOSPITAL_COMMUNITY): Payer: Medicare Other

## 2018-03-22 ENCOUNTER — Encounter (HOSPITAL_COMMUNITY)
Admission: RE | Admit: 2018-03-22 | Discharge: 2018-03-22 | Disposition: A | Discharge status: Home / Self Care | Payer: Medicare Other | Source: Ambulatory Visit | Attending: Cardiology | Admitting: Cardiology

## 2018-03-22 DIAGNOSIS — I2510 Atherosclerotic heart disease of native coronary artery without angina pectoris: Secondary | ICD-10-CM | POA: Diagnosis not present

## 2018-03-22 DIAGNOSIS — Z955 Presence of coronary angioplasty implant and graft: Secondary | ICD-10-CM | POA: Diagnosis not present

## 2018-03-22 DIAGNOSIS — I2102 ST elevation (STEMI) myocardial infarction involving left anterior descending coronary artery: Secondary | ICD-10-CM | POA: Diagnosis not present

## 2018-03-22 DIAGNOSIS — E7800 Pure hypercholesterolemia, unspecified: Secondary | ICD-10-CM | POA: Diagnosis not present

## 2018-03-22 DIAGNOSIS — Z8673 Personal history of transient ischemic attack (TIA), and cerebral infarction without residual deficits: Secondary | ICD-10-CM | POA: Diagnosis not present

## 2018-03-22 DIAGNOSIS — I10 Essential (primary) hypertension: Secondary | ICD-10-CM | POA: Diagnosis not present

## 2018-03-24 ENCOUNTER — Encounter (HOSPITAL_COMMUNITY): Payer: Medicare Other

## 2018-03-24 ENCOUNTER — Encounter (HOSPITAL_COMMUNITY)
Admission: RE | Admit: 2018-03-24 | Discharge: 2018-03-24 | Disposition: A | Discharge status: Home / Self Care | Payer: Medicare Other | Source: Ambulatory Visit | Attending: Cardiology | Admitting: Cardiology

## 2018-03-24 DIAGNOSIS — I2102 ST elevation (STEMI) myocardial infarction involving left anterior descending coronary artery: Secondary | ICD-10-CM | POA: Diagnosis not present

## 2018-03-24 DIAGNOSIS — I2510 Atherosclerotic heart disease of native coronary artery without angina pectoris: Secondary | ICD-10-CM | POA: Diagnosis not present

## 2018-03-24 DIAGNOSIS — Z8673 Personal history of transient ischemic attack (TIA), and cerebral infarction without residual deficits: Secondary | ICD-10-CM | POA: Diagnosis not present

## 2018-03-24 DIAGNOSIS — E7800 Pure hypercholesterolemia, unspecified: Secondary | ICD-10-CM | POA: Diagnosis not present

## 2018-03-24 DIAGNOSIS — I10 Essential (primary) hypertension: Secondary | ICD-10-CM | POA: Diagnosis not present

## 2018-03-24 DIAGNOSIS — Z955 Presence of coronary angioplasty implant and graft: Secondary | ICD-10-CM | POA: Diagnosis not present

## 2018-03-26 ENCOUNTER — Encounter (HOSPITAL_COMMUNITY): Payer: Medicare Other

## 2018-03-26 ENCOUNTER — Encounter (HOSPITAL_COMMUNITY)
Admission: RE | Admit: 2018-03-26 | Discharge: 2018-03-26 | Disposition: A | Discharge status: Home / Self Care | Payer: Medicare Other | Source: Ambulatory Visit | Attending: Cardiology | Admitting: Cardiology

## 2018-03-26 DIAGNOSIS — Z955 Presence of coronary angioplasty implant and graft: Secondary | ICD-10-CM | POA: Diagnosis not present

## 2018-03-26 DIAGNOSIS — I2510 Atherosclerotic heart disease of native coronary artery without angina pectoris: Secondary | ICD-10-CM | POA: Diagnosis not present

## 2018-03-26 DIAGNOSIS — I2102 ST elevation (STEMI) myocardial infarction involving left anterior descending coronary artery: Secondary | ICD-10-CM | POA: Diagnosis not present

## 2018-03-26 DIAGNOSIS — I10 Essential (primary) hypertension: Secondary | ICD-10-CM | POA: Diagnosis not present

## 2018-03-26 DIAGNOSIS — E7800 Pure hypercholesterolemia, unspecified: Secondary | ICD-10-CM | POA: Diagnosis not present

## 2018-03-26 DIAGNOSIS — Z8673 Personal history of transient ischemic attack (TIA), and cerebral infarction without residual deficits: Secondary | ICD-10-CM | POA: Diagnosis not present

## 2018-03-29 ENCOUNTER — Encounter (HOSPITAL_COMMUNITY): Payer: Medicare Other

## 2018-03-29 ENCOUNTER — Encounter (HOSPITAL_COMMUNITY)
Admission: RE | Admit: 2018-03-29 | Discharge: 2018-03-29 | Disposition: A | Discharge status: Home / Self Care | Payer: Medicare Other | Source: Ambulatory Visit | Attending: Cardiology | Admitting: Cardiology

## 2018-03-29 DIAGNOSIS — Z955 Presence of coronary angioplasty implant and graft: Secondary | ICD-10-CM | POA: Diagnosis not present

## 2018-03-29 DIAGNOSIS — I10 Essential (primary) hypertension: Secondary | ICD-10-CM | POA: Diagnosis not present

## 2018-03-29 DIAGNOSIS — I2102 ST elevation (STEMI) myocardial infarction involving left anterior descending coronary artery: Secondary | ICD-10-CM

## 2018-03-29 DIAGNOSIS — E7800 Pure hypercholesterolemia, unspecified: Secondary | ICD-10-CM | POA: Diagnosis not present

## 2018-03-29 DIAGNOSIS — I2510 Atherosclerotic heart disease of native coronary artery without angina pectoris: Secondary | ICD-10-CM | POA: Diagnosis not present

## 2018-03-29 DIAGNOSIS — Z8673 Personal history of transient ischemic attack (TIA), and cerebral infarction without residual deficits: Secondary | ICD-10-CM | POA: Diagnosis not present

## 2018-03-31 ENCOUNTER — Encounter (HOSPITAL_COMMUNITY): Payer: Medicare Other

## 2018-03-31 ENCOUNTER — Encounter (HOSPITAL_COMMUNITY)
Admission: RE | Admit: 2018-03-31 | Discharge: 2018-03-31 | Disposition: A | Discharge status: Home / Self Care | Payer: Medicare Other | Source: Ambulatory Visit | Attending: Cardiology | Admitting: Cardiology

## 2018-03-31 DIAGNOSIS — G47419 Narcolepsy without cataplexy: Secondary | ICD-10-CM | POA: Insufficient documentation

## 2018-03-31 DIAGNOSIS — Z87891 Personal history of nicotine dependence: Secondary | ICD-10-CM | POA: Insufficient documentation

## 2018-03-31 DIAGNOSIS — Z7982 Long term (current) use of aspirin: Secondary | ICD-10-CM | POA: Diagnosis not present

## 2018-03-31 DIAGNOSIS — I2510 Atherosclerotic heart disease of native coronary artery without angina pectoris: Secondary | ICD-10-CM | POA: Diagnosis not present

## 2018-03-31 DIAGNOSIS — I2102 ST elevation (STEMI) myocardial infarction involving left anterior descending coronary artery: Secondary | ICD-10-CM | POA: Insufficient documentation

## 2018-03-31 DIAGNOSIS — E7800 Pure hypercholesterolemia, unspecified: Secondary | ICD-10-CM | POA: Diagnosis not present

## 2018-03-31 DIAGNOSIS — Z79899 Other long term (current) drug therapy: Secondary | ICD-10-CM | POA: Diagnosis not present

## 2018-03-31 DIAGNOSIS — Z8673 Personal history of transient ischemic attack (TIA), and cerebral infarction without residual deficits: Secondary | ICD-10-CM | POA: Diagnosis not present

## 2018-03-31 DIAGNOSIS — I10 Essential (primary) hypertension: Secondary | ICD-10-CM | POA: Insufficient documentation

## 2018-03-31 DIAGNOSIS — I714 Abdominal aortic aneurysm, without rupture: Secondary | ICD-10-CM | POA: Insufficient documentation

## 2018-03-31 DIAGNOSIS — Z955 Presence of coronary angioplasty implant and graft: Secondary | ICD-10-CM | POA: Insufficient documentation

## 2018-03-31 DIAGNOSIS — I213 ST elevation (STEMI) myocardial infarction of unspecified site: Secondary | ICD-10-CM | POA: Diagnosis present

## 2018-03-31 DIAGNOSIS — Z7902 Long term (current) use of antithrombotics/antiplatelets: Secondary | ICD-10-CM | POA: Diagnosis not present

## 2018-04-01 DIAGNOSIS — I129 Hypertensive chronic kidney disease with stage 1 through stage 4 chronic kidney disease, or unspecified chronic kidney disease: Secondary | ICD-10-CM | POA: Diagnosis not present

## 2018-04-01 DIAGNOSIS — K922 Gastrointestinal hemorrhage, unspecified: Secondary | ICD-10-CM | POA: Diagnosis not present

## 2018-04-01 DIAGNOSIS — Z23 Encounter for immunization: Secondary | ICD-10-CM | POA: Diagnosis not present

## 2018-04-01 DIAGNOSIS — N183 Chronic kidney disease, stage 3 (moderate): Secondary | ICD-10-CM | POA: Diagnosis not present

## 2018-04-01 DIAGNOSIS — I480 Paroxysmal atrial fibrillation: Secondary | ICD-10-CM | POA: Diagnosis not present

## 2018-04-01 DIAGNOSIS — I270 Primary pulmonary hypertension: Secondary | ICD-10-CM | POA: Diagnosis not present

## 2018-04-01 NOTE — Progress Notes (Signed)
Cardiac Individual Treatment Plan  Patient Details  Name: Paul Kramer MRN: [id number] Date of Birth: [birth year] Referring Provider:     [facility] from 02/04/2018 in [place]  Referring Provider  Leonie Man MD       Initial Encounter Date:    CARDIAC REHAB PHASE II ORIENTATION from 02/04/2018 in [place]  Date  02/04/18      Visit Diagnosis: ST elevation myocardial infarction involving left anterior descending (LAD) coronary artery [facility])  Status post coronary artery stent placement  Patient's Home Medications on Admission:  Current Outpatient Medications:  .  acetaminophen (TYLENOL) 325 MG tablet, Take 325 mg by mouth every 6 (six) hours as needed for mild pain., Disp: , Rfl:  .  allopurinol (ZYLOPRIM) 100 MG tablet, Take 100 mg by mouth every morning., Disp: , Rfl:  .  amiodarone (PACERONE) 100 MG tablet, Take 1 tablet (100 mg total) by mouth daily., Disp: 90 tablet, Rfl: 3 .  aspirin EC 81 MG tablet, Take 81 mg by mouth daily., Disp: , Rfl:  .  atorvastatin (LIPITOR) 40 MG tablet, Take 1 tablet (40 mg total) by mouth daily at 6 PM., Disp: 90 tablet, Rfl: 3 .  clopidogrel (PLAVIX) 75 MG tablet, Take 1 tablet (75 mg total) by mouth daily., Disp: 90 tablet, Rfl: 3 .  cyanocobalamin 1000 MCG tablet, Take 1 tablet (1,000 mcg total) by mouth daily., Disp: 90 tablet, Rfl: 3 .  Multiple Vitamins-Minerals (CENTRUM SILVER PO), Take 1 tablet by mouth daily., Disp: , Rfl:  .  nitroGLYCERIN (NITROSTAT) 0.4 MG SL tablet, Place 1 tablet (0.4 mg total) under the tongue every 5 (five) minutes x 3 doses as needed for chest pain., Disp: 25 tablet, Rfl: 3 .  pantoprazole (PROTONIX) 40 MG tablet, Take 1 tablet (40 mg total) by mouth daily., Disp: 90 tablet, Rfl: 3 .  Psyllium (METAMUCIL FIBER PO), Take by mouth., Disp: , Rfl:  .  terazosin (HYTRIN) 2 MG capsule, Take 4 mg by mouth every evening., Disp: ,  Rfl:  .  VITAMIN D, ERGOCALCIFEROL, PO, Take 5,000 Units by mouth daily. , Disp: , Rfl:   Past Medical History: Past Medical History:  Diagnosis Date  . AAA (abdominal aortic aneurysm) ([place])   . Coronary artery disease   . Hypercholesteremia   . Hypertension   . Myocardial infarction of inferolateral wall ([place]) 11/2017   ? prior Infarct; - Cath 11/29/17 - 3 V CAD (CTO RCA), 95% m-d Cx (@ OM2 55%)-> PCI Cx-PTCA Om2; 85% p-m LAD => DES PCI   . Narcolepsy    pt may pass out when laughing  . Spinal stenosis   . TIA (transient ischemic attack)     Tobacco Use: Social History   Tobacco Use  Smoking Status Former Smoker  Smokeless Tobacco Never Used    Labs: Recent [employer] for ITP Cardiac and Pulmonary Rehab Latest Ref Rng & Units 11/30/2017   Cholestrol 0 - 200 mg/dL 137   LDLCALC 0 - 99 mg/dL 80   HDL >40 mg/dL 38(L)   Trlycerides <150 mg/dL 93   Hemoglobin A1c 4.8 - 5.6 % 5.7(H)      Capillary Blood Glucose: Lab Results  Component Value Date   GLUCAP 91 12/03/2017     Exercise Target Goals: Exercise Program Goal: Individual exercise prescription set using results from initial 6 min walk test and THRR while  considering  patient's activity barriers and safety.   Exercise Prescription Goal: Initial exercise prescription builds to 30-45 minutes a day of aerobic activity, 2-3 days per week.  Home exercise guidelines will be given to patient during program as part of exercise prescription that the participant will acknowledge.  Activity Barriers & Risk Stratification: Activity Barriers & Cardiac Risk Stratification - 02/04/18 1230      Activity Barriers & Cardiac Risk Stratification   Activity Barriers  Back Problems;Other (comment)    Comments  Chronic Right Knee Pain " No Cartilage", [place]: may pass out from laughing     Cardiac Risk Stratification  High       6 Minute Walk: 6 Minute Walk    Row Name 02/04/18 1212         6 Minute Walk    Phase  Initial     Distance  1459 feet     Walk Time  6 minutes     # of Rest Breaks  0     MPH  2.76     METS  1.86     RPE  9     Perceived Dyspnea   0     VO2 Peak  6.49     Symptoms  No     Resting HR  56 bpm     Resting BP  122/60     Resting Oxygen Saturation   97 %     Exercise Oxygen Saturation  during 6 min walk  98 %     Max Ex. HR  62 bpm     Max Ex. BP  138/80     2 Minute Post BP  138/78        Oxygen Initial Assessment:   Oxygen Re-Evaluation:   Oxygen Discharge (Final Oxygen Re-Evaluation):   Initial Exercise Prescription: Initial Exercise Prescription - 02/04/18 1200      Date of Initial Exercise RX and Referring Provider   Date  02/04/18    Referring Provider  Leonie Man MD     Expected Discharge Date  04/30/18      NuStep   Level  2    SPM  75    Minutes  15    METs  1.8      Track   Laps  12    Minutes  10    METs  2.39      Prescription Details   Frequency (times per week)  3x    Duration  Progress to 30 minutes of continuous aerobic without signs/symptoms of physical distress      Intensity   THRR 40-80% of Max Heartrate  54-108    Ratings of Perceived Exertion  11-13    Perceived Dyspnea  0-4      Progression   Progression  Continue progressive overload as per policy without signs/symptoms or physical distress.      Resistance Training   Training Prescription  Yes    Weight  3lbs    Reps  10-15       Perform Capillary Blood Glucose checks as needed.  Exercise Prescription Changes: Exercise Prescription Changes    Row Name 02/08/18 0958 02/22/18 0954 03/08/18 0946 03/22/18 0947       Response to Exercise   Blood Pressure (Admit)  118/60  116/70  104/58  122/52    Blood Pressure (Exercise)  138/72  132/62  140/50  122/60    Blood Pressure (Exit)  130/70  122/60  130/60  103/52    Heart Rate (Admit)  62 bpm  72 bpm  84 bpm  72 bpm    Heart Rate (Exercise)  99 bpm  81 bpm  93 bpm  94 bpm    Heart Rate (Exit)   55 bpm  62 bpm  65 bpm  54 bpm    Rating of Perceived Exertion (Exercise)  11  12  11  11     Symptoms  none  none  none  none    Duration  Progress to 30 minutes of  aerobic without signs/symptoms of physical distress  Progress to 30 minutes of  aerobic without signs/symptoms of physical distress  Progress to 30 minutes of  aerobic without signs/symptoms of physical distress  Progress to 30 minutes of  aerobic without signs/symptoms of physical distress    Intensity  THRR unchanged  THRR unchanged  THRR unchanged  THRR unchanged      Progression   Progression  Continue to progress workloads to maintain intensity without signs/symptoms of physical distress.  Continue to progress workloads to maintain intensity without signs/symptoms of physical distress.  Continue to progress workloads to maintain intensity without signs/symptoms of physical distress.  Continue to progress workloads to maintain intensity without signs/symptoms of physical distress.    Average METs  2.4  2  2.9  2.5      Resistance Training   Training Prescription  Yes  Yes  Yes  Yes    Weight  3lbs  3lbs  3lbs  3lbs    Reps  10-15  10-15  10-15  10-15    Time  10 Minutes  10 Minutes  10 Minutes  10 Minutes      Interval Training   Interval Training  No  No  No  No      NuStep   Level  2  2  3  4     SPM  85  85  85  85    Minutes  15  15  15  15     METs  2.6  2.6  2.9  2.5      Arm Ergometer   Level  -  1  1  1     Minutes  -  15  15  15       Track   Laps  10  -  -  -    Minutes  15  -  -  -    METs  2.16  -  -  -      Home Exercise Plan   Plans to continue exercise at  -  -  Home (comment)  Home (comment)    Frequency  -  -  Add 4 additional days to program exercise sessions.  Add 4 additional days to program exercise sessions.    Initial Home Exercises Provided  -  -  02/26/18  02/26/18       Exercise Comments: Exercise Comments    Row Name 02/08/18 1047 02/22/18 1030 02/26/18 1017 03/10/18 1023 03/17/18  1050   Exercise Comments  Patient tolerated 1st session of exercie well without c/o.  Reviewed METs and goals with patient.  Home exercise completed  Reviewed METs with patient.  Patient states that he's been cleared to resume swimming, and he was able to swim 8 laps in 30 minutes yesterday without any issues. Pt plans to go swimming again tomorrow. Pt states he was previously swimming 2-3 days/week. Pt seems quite please  to return to swimming.   [place] Name 03/24/18 1000           Exercise Comments  Reviewed METs and goals with patient.          Exercise Goals and Review: Exercise Goals    Row Name 02/04/18 1214             Exercise Goals   Increase Physical Activity  Yes       Intervention  Provide advice, education, support and counseling about physical activity/exercise needs.;Develop an individualized exercise prescription for aerobic and resistive training based on initial evaluation findings, risk stratification, comorbidities and participant's personal goals.       Expected Outcomes  Short Term: Attend rehab on a regular basis to increase amount of physical activity.;Long Term: Add in home exercise to make exercise part of routine and to increase amount of physical activity.;Long Term: Exercising regularly at least 3-5 days a week.       Increase Strength and Stamina  Yes       Intervention  Provide advice, education, support and counseling about physical activity/exercise needs.;Develop an individualized exercise prescription for aerobic and resistive training based on initial evaluation findings, risk stratification, comorbidities and participant's personal goals.       Expected Outcomes  Short Term: Perform resistance training exercises routinely during rehab and add in resistance training at home;Short Term: Increase workloads from initial exercise prescription for resistance, speed, and METs.;Long Term: Improve cardiorespiratory fitness, muscular endurance and strength as measured  by increased METs and functional capacity (6MWT)       Able to understand and use rate of perceived exertion (RPE) scale  Yes       Intervention  Provide education and explanation on how to use RPE scale       Expected Outcomes  Short Term: Able to use RPE daily in rehab to express subjective intensity level;Long Term:  Able to use RPE to guide intensity level when exercising independently       Knowledge and understanding of Target Heart Rate Range (THRR)  Yes       Intervention  Provide education and explanation of THRR including how the numbers were predicted and where they are located for reference       Expected Outcomes  Short Term: Able to state/look up THRR;Short Term: Able to use daily as guideline for intensity in rehab;Long Term: Able to use THRR to govern intensity when exercising independently       Able to check pulse independently  Yes       Intervention  Provide education and demonstration on how to check pulse in carotid and radial arteries.;Review the importance of being able to check your own pulse for safety during independent exercise       Expected Outcomes  Short Term: Able to explain why pulse checking is important during independent exercise;Long Term: Able to check pulse independently and accurately       Understanding of Exercise Prescription  Yes       Intervention  Provide education, explanation, and written materials on patient's individual exercise prescription       Expected Outcomes  Short Term: Able to explain program exercise prescription;Long Term: Able to explain home exercise prescription to exercise independently          Exercise Goals Re-Evaluation : Exercise Goals Re-Evaluation    Row Name 02/08/18 1047 02/22/18 1030 03/24/18 1000         Exercise Goal Re-Evaluation  Exercise Goals Review  Able to understand and use rate of perceived exertion (RPE) scale  Increase Physical Activity  Increase Physical Activity     Comments  Patient able to understand  and use RPE scale sppropriately.  Patient states that he's walking 0.6 miles twice/day, 4 days/ week in addition to exercise at cardiac rehab.   In addition to walking 0.6 of a mile BID, pt is now swimming 10 laps on Tuesdays, Wednesdays, and Thursdays.     Expected Outcomes  Increase workloads as tolerated.  Continue to progress workloads to help improve strength and stamina.  Patient will continue walking and swimming routine to help improve cardiorespiratory fitness.        Discharge Exercise Prescription (Final Exercise Prescription Changes): Exercise Prescription Changes - 03/22/18 0947      Response to Exercise   Blood Pressure (Admit)  122/52    Blood Pressure (Exercise)  122/60    Blood Pressure (Exit)  103/52    Heart Rate (Admit)  72 bpm    Heart Rate (Exercise)  94 bpm    Heart Rate (Exit)  54 bpm    Rating of Perceived Exertion (Exercise)  11    Symptoms  none    Duration  Progress to 30 minutes of  aerobic without signs/symptoms of physical distress    Intensity  THRR unchanged      Progression   Progression  Continue to progress workloads to maintain intensity without signs/symptoms of physical distress.    Average METs  2.5      Resistance Training   Training Prescription  Yes    Weight  3lbs    Reps  10-15    Time  10 Minutes      Interval Training   Interval Training  No      NuStep   Level  4    SPM  85    Minutes  15    METs  2.5      Arm Ergometer   Level  1    Minutes  15      Home Exercise Plan   Plans to continue exercise at  Home (comment)    Frequency  Add 4 additional days to program exercise sessions.    Initial Home Exercises Provided  02/26/18       Nutrition:  Target Goals: Understanding of nutrition guidelines, daily intake of sodium 1500mg , cholesterol 200mg , calories 30% from fat and 7% or less from saturated fats, daily to have 5 or more servings of fruits and vegetables.  Biometrics: Pre Biometrics - 02/04/18 1214      Pre  Biometrics   Height  5\' 5"  (1.651 m)    Weight  81.4 kg    Waist Circumference  41 inches    Hip Circumference  42 inches    Waist to Hip Ratio  0.98 %    BMI (Calculated)  29.86    Triceps Skinfold  26 mm    % Body Fat  32.1 %    Grip Strength  33 kg    Flexibility  12.5 in    Single Leg Stand  0 seconds        Nutrition Therapy Plan and Nutrition Goals: Nutrition Therapy & Goals - 02/04/18 1035      Nutrition Therapy   Diet  consistent carbohydrate heart healthy      Personal Nutrition Goals   Nutrition Goal  Pt to identify and limit food sources of saturated fat, trans  fat, sodium, refined carbohydrates    Personal Goal #2  Pt to identify food quantities necessary to achieve weight loss of 6-24 lbs. at graduation from cardiac rehab.    Personal Goal #3  Pt able to name foods that affect blood glucose.      Intervention Plan   Intervention  Prescribe, educate and counsel regarding individualized specific dietary modifications aiming towards targeted core components such as weight, hypertension, lipid management, diabetes, heart failure and other comorbidities.    Expected Outcomes  Short Term Goal: Understand basic principles of dietary content, such as calories, fat, sodium, cholesterol and nutrients.       Nutrition Assessments: Nutrition Assessments - 02/04/18 1036      MEDFICTS Scores   Pre Score  12       Nutrition Goals Re-Evaluation: Nutrition Goals Re-Evaluation    [place] Name 02/04/18 1035             Goals   Current Weight  179 lb 7.3 oz (81.4 kg)          Nutrition Goals Re-Evaluation: Nutrition Goals Re-Evaluation    [place] Name 02/04/18 1035             Goals   Current Weight  179 lb 7.3 oz (81.4 kg)          Nutrition Goals Discharge (Final Nutrition Goals Re-Evaluation): Nutrition Goals Re-Evaluation - 02/04/18 1035      Goals   Current Weight  179 lb 7.3 oz (81.4 kg)       Psychosocial: Target Goals: Acknowledge presence or  absence of significant depression and/or stress, maximize coping skills, provide positive support system. Participant is able to verbalize types and ability to use techniques and skills needed for reducing stress and depression.  Initial Review & Psychosocial Screening: Initial Psych Review & Screening - 02/04/18 1038      Initial Review   Current issues with  None Identified      Family Dynamics   Good Support System?  Yes   Sparsh lists his daughter and son-in-law as sources of support.  His daughter was present for orientation.      Barriers   Psychosocial barriers to participate in program  There are no identifiable barriers or psychosocial needs.      Screening Interventions   Interventions  Encouraged to exercise       Quality of Life Scores: Quality of Life - 02/04/18 1045      Quality of Life   Select  Quality of Life      Quality of Life Scores   Health/Function Pre  27.63 %    Socioeconomic Pre  30 %    Psych/Spiritual Pre  30 %    Family Pre  30 %    GLOBAL Pre  28.89 %      Scores of 19 and below usually indicate a poorer quality of life in these areas.  A difference of  2-3 points is a clinically meaningful difference.  A difference of 2-3 points in the total score of the Quality of Life Index has been associated with significant improvement in overall quality of life, self-image, physical symptoms, and general health in studies assessing change in quality of life.  PHQ-9: Recent Review Flowsheet Data    Depression screen [facility] 2/9 02/08/2018   Decreased Interest 0   Down, Depressed, Hopeless 0   PHQ - 2 Score 0     Interpretation of Total Score  Total Score Depression Severity:  1-4 = Minimal depression, 5-9 = Mild depression, 10-14 = Moderate depression, 15-19 = Moderately severe depression, 20-27 = Severe depression   Psychosocial Evaluation and Intervention:   Psychosocial Re-Evaluation: Psychosocial Re-Evaluation    [place] Name 03/04/18 1417 04/01/18  1137           Psychosocial Re-Evaluation   Current issues with  None Identified  None Identified      Interventions  Encouraged to attend Cardiac Rehabilitation for the exercise  Encouraged to attend Cardiac Rehabilitation for the exercise      Continue Psychosocial Services   No Follow up required  No Follow up required         Psychosocial Discharge (Final Psychosocial Re-Evaluation): Psychosocial Re-Evaluation - 04/01/18 1137      Psychosocial Re-Evaluation   Current issues with  None Identified    Interventions  Encouraged to attend Cardiac Rehabilitation for the exercise    Continue Psychosocial Services   No Follow up required       Vocational Rehabilitation: Provide vocational rehab assistance to qualifying candidates.   Vocational Rehab Evaluation & Intervention: Vocational Rehab - 02/04/18 1038      Initial Vocational Rehab Evaluation & Intervention   Assessment shows need for Vocational Rehabilitation  No       Education: Education Goals: Education classes will be provided on a weekly basis, covering required topics. Participant will state understanding/return demonstration of topics presented.  Learning Barriers/Preferences: Learning Barriers/Preferences - 02/04/18 1227      Learning Barriers/Preferences   Learning Barriers  Sight;Hearing    Learning Preferences  Written Material;Video;Skilled Demonstration       Education Topics: Count Your Pulse:  -Group instruction provided by verbal instruction, demonstration, patient participation and written materials to support subject.  Instructors address importance of being able to find your pulse and how to count your pulse when at home without a heart monitor.  Patients get hands on experience counting their pulse with staff help and individually.   Heart Attack, Angina, and Risk Factor Modification:  -Group instruction provided by verbal instruction, video, and written materials to support subject.   Instructors address signs and symptoms of angina and heart attacks.    Also discuss risk factors for heart disease and how to make changes to improve heart health risk factors.   Functional Fitness:  -Group instruction provided by verbal instruction, demonstration, patient participation, and written materials to support subject.  Instructors address safety measures for doing things around the house.  Discuss how to get up and down off the floor, how to pick things up properly, how to safely get out of a chair without assistance, and balance training.   Meditation and Mindfulness:  -Group instruction provided by verbal instruction, patient participation, and written materials to support subject.  Instructor addresses importance of mindfulness and meditation practice to help reduce stress and improve awareness.  Instructor also leads participants through a meditation exercise.    Stretching for Flexibility and Mobility:  -Group instruction provided by verbal instruction, patient participation, and written materials to support subject.  Instructors lead participants through series of stretches that are designed to increase flexibility thus improving mobility.  These stretches are additional exercise for major muscle groups that are typically performed during regular warm up and cool down.   Hands Only CPR:  -Group verbal, video, and participation provides a basic overview of AHA guidelines for community CPR. Role-play of emergencies allow participants the opportunity to practice calling for help and chest compression technique with  discussion of AED use.   Hypertension: -Group verbal and written instruction that provides a basic overview of hypertension including the most recent diagnostic guidelines, risk factor reduction with self-care instructions and medication management.    Nutrition I class: Heart Healthy Eating:  -Group instruction provided by PowerPoint slides, verbal discussion, and  written materials to support subject matter. The instructor gives an explanation and review of the Therapeutic Lifestyle Changes diet recommendations, which includes a discussion on lipid goals, dietary fat, sodium, fiber, plant stanol/sterol esters, sugar, and the components of a well-balanced, healthy diet.   Nutrition II class: Lifestyle Skills:  -Group instruction provided by PowerPoint slides, verbal discussion, and written materials to support subject matter. The instructor gives an explanation and review of label reading, grocery shopping for heart health, heart healthy recipe modifications, and ways to make healthier choices when eating out.   Diabetes Question & Answer:  -Group instruction provided by PowerPoint slides, verbal discussion, and written materials to support subject matter. The instructor gives an explanation and review of diabetes co-morbidities, pre- and post-prandial blood glucose goals, pre-exercise blood glucose goals, signs, symptoms, and treatment of hypoglycemia and hyperglycemia, and foot care basics.   Diabetes Blitz:  -Group instruction provided by PowerPoint slides, verbal discussion, and written materials to support subject matter. The instructor gives an explanation and review of the physiology behind type 1 and type 2 diabetes, diabetes medications and rational behind using different medications, pre- and post-prandial blood glucose recommendations and Hemoglobin A1c goals, diabetes diet, and exercise including blood glucose guidelines for exercising safely.    Portion Distortion:  -Group instruction provided by PowerPoint slides, verbal discussion, written materials, and food models to support subject matter. The instructor gives an explanation of serving size versus portion size, changes in portions sizes over the last 20 years, and what consists of a serving from each food group.   Stress Management:  -Group instruction provided by verbal instruction,  video, and written materials to support subject matter.  Instructors review role of stress in heart disease and how to cope with stress positively.     Exercising on Your Own:  -Group instruction provided by verbal instruction, power point, and written materials to support subject.  Instructors discuss benefits of exercise, components of exercise, frequency and intensity of exercise, and end points for exercise.  Also discuss use of nitroglycerin and activating EMS.  Review options of places to exercise outside of rehab.  Review guidelines for sex with heart disease.   Cardiac Drugs I:  -Group instruction provided by verbal instruction and written materials to support subject.  Instructor reviews cardiac drug classes: antiplatelets, anticoagulants, beta blockers, and statins.  Instructor discusses reasons, side effects, and lifestyle considerations for each drug class.   Cardiac Drugs II:  -Group instruction provided by verbal instruction and written materials to support subject.  Instructor reviews cardiac drug classes: angiotensin converting enzyme inhibitors (ACE-I), angiotensin II receptor blockers (ARBs), nitrates, and calcium channel blockers.  Instructor discusses reasons, side effects, and lifestyle considerations for each drug class.   Anatomy and Physiology of the Circulatory System:  Group verbal and written instruction and models provide basic cardiac anatomy and physiology, with the coronary electrical and arterial systems. Review of: AMI, Angina, Valve disease, Heart Failure, Peripheral Artery Disease, Cardiac Arrhythmia, Pacemakers, and the ICD.   Other Education:  -Group or individual verbal, written, or video instructions that support the educational goals of the cardiac rehab program.   Holiday Eating Survival Tips:  -Group instruction  provided by PowerPoint slides, verbal discussion, and written materials to support subject matter. The instructor gives patients tips,  tricks, and techniques to help them not only survive but enjoy the holidays despite the onslaught of food that accompanies the holidays.   Knowledge Questionnaire Score: Knowledge Questionnaire Score - 02/04/18 1038      Knowledge Questionnaire Score   Pre Score  20/24       Core Components/Risk Factors/Patient Goals at Admission: Personal Goals and Risk Factors at Admission - 02/04/18 1228      Core Components/Risk Factors/Patient Goals on Admission    Weight Management  Yes    Intervention  Weight Management: Develop a combined nutrition and exercise program designed to reach desired caloric intake, while maintaining appropriate intake of nutrient and fiber, sodium and fats, and appropriate energy expenditure required for the weight goal.;Weight Management: Provide education and appropriate resources to help participant work on and attain dietary goals.;Weight Management/Obesity: Establish reasonable short term and long term weight goals.    Admit Weight  179 lb 7.3 oz (81.4 kg)    Goal Weight: Short Term  175 lb (79.4 kg)    Goal Weight: Long Term  169 lb (76.7 kg)    Expected Outcomes  Short Term: Continue to assess and modify interventions until short term weight is achieved;Long Term: Adherence to nutrition and physical activity/exercise program aimed toward attainment of established weight goal;Weight Loss: Understanding of general recommendations for a balanced deficit meal plan, which promotes 1-2 lb weight loss per week and includes a negative energy balance of [phone] kcal/d;Understanding recommendations for meals to include 15-35% energy as protein, 25-35% energy from fat, 35-60% energy from carbohydrates, less than 200mg  of dietary cholesterol, 20-35 gm of total fiber daily;Understanding of distribution of calorie intake throughout the day with the consumption of 4-5 meals/snacks    Hypertension  Yes    Intervention  Provide education on lifestyle modifcations including regular  physical activity/exercise, weight management, moderate sodium restriction and increased consumption of fresh fruit, vegetables, and low fat dairy, alcohol moderation, and smoking cessation.;Monitor prescription use compliance.    Expected Outcomes  Short Term: Continued assessment and intervention until BP is < 140/67mm HG in hypertensive participants. < 130/45mm HG in hypertensive participants with diabetes, heart failure or chronic kidney disease.;Long Term: Maintenance of blood pressure at goal levels.    Lipids  Yes    Intervention  Provide education and support for participant on nutrition & aerobic/resistive exercise along with prescribed medications to achieve LDL 70mg , HDL >40mg .    Expected Outcomes  Short Term: Participant states understanding of desired cholesterol values and is compliant with medications prescribed. Participant is following exercise prescription and nutrition guidelines.;Long Term: Cholesterol controlled with medications as prescribed, with individualized exercise RX and with personalized nutrition plan. Value goals: LDL < 70mg , HDL > 40 mg.       Core Components/Risk Factors/Patient Goals Review:  Goals and Risk Factor Review    Row Name 03/04/18 1418             Core Components/Risk Factors/Patient Goals Review   Personal Goals Review  Hypertension;Lipids       Review  [name] has been doing well with exercise at cardiac rehab. Bob's vital signs have been stable. [name] has maintained his current weight.       Expected Outcomes  Patient will continue to participate in exercise, nutrition and lifestyle modification opportunities          Core Components/Risk Factors/Patient Goals at  Discharge (Final Review):  Goals and Risk Factor Review - 03/04/18 1418      Core Components/Risk Factors/Patient Goals Review   Personal Goals Review  Hypertension;Lipids    Review  [name] has been doing well with exercise at cardiac rehab. Bob's vital signs have been stable. [name] has  maintained his current weight.    Expected Outcomes  Patient will continue to participate in exercise, nutrition and lifestyle modification opportunities       ITP Comments: ITP Comments    Row Name 02/04/18 1210 03/04/18 1416 04/01/18 1136       ITP Comments  Dr. Fransico Him, Medical Director   30 Day ITP Review. Patient with good participation and attendance in phase 2 cardiac rehab  30 Day ITP Review. Patient with good participation and attendance in phase 2 cardiac rehab        Comments: See ITP comments.Barnet Pall, RN,BSN 04/01/2018 11:40 AM

## 2018-04-02 ENCOUNTER — Encounter (HOSPITAL_COMMUNITY): Payer: Medicare Other

## 2018-04-02 ENCOUNTER — Encounter (HOSPITAL_COMMUNITY)
Admission: RE | Admit: 2018-04-02 | Discharge: 2018-04-02 | Disposition: A | Discharge status: Home / Self Care | Payer: Medicare Other | Source: Ambulatory Visit | Attending: Cardiology | Admitting: Cardiology

## 2018-04-02 DIAGNOSIS — Z955 Presence of coronary angioplasty implant and graft: Secondary | ICD-10-CM | POA: Diagnosis not present

## 2018-04-02 DIAGNOSIS — E7800 Pure hypercholesterolemia, unspecified: Secondary | ICD-10-CM | POA: Diagnosis not present

## 2018-04-02 DIAGNOSIS — I2102 ST elevation (STEMI) myocardial infarction involving left anterior descending coronary artery: Secondary | ICD-10-CM

## 2018-04-02 DIAGNOSIS — I10 Essential (primary) hypertension: Secondary | ICD-10-CM | POA: Diagnosis not present

## 2018-04-02 DIAGNOSIS — I2510 Atherosclerotic heart disease of native coronary artery without angina pectoris: Secondary | ICD-10-CM | POA: Diagnosis not present

## 2018-04-02 DIAGNOSIS — Z8673 Personal history of transient ischemic attack (TIA), and cerebral infarction without residual deficits: Secondary | ICD-10-CM | POA: Diagnosis not present

## 2018-04-05 ENCOUNTER — Encounter (HOSPITAL_COMMUNITY)
Admission: RE | Admit: 2018-04-05 | Discharge: 2018-04-05 | Disposition: A | Discharge status: Home / Self Care | Payer: Medicare Other | Source: Ambulatory Visit | Attending: Cardiology | Admitting: Cardiology

## 2018-04-05 ENCOUNTER — Encounter (HOSPITAL_COMMUNITY): Payer: Medicare Other

## 2018-04-05 DIAGNOSIS — I10 Essential (primary) hypertension: Secondary | ICD-10-CM | POA: Diagnosis not present

## 2018-04-05 DIAGNOSIS — Z955 Presence of coronary angioplasty implant and graft: Secondary | ICD-10-CM

## 2018-04-05 DIAGNOSIS — I2510 Atherosclerotic heart disease of native coronary artery without angina pectoris: Secondary | ICD-10-CM | POA: Diagnosis not present

## 2018-04-05 DIAGNOSIS — N184 Chronic kidney disease, stage 4 (severe): Secondary | ICD-10-CM | POA: Diagnosis not present

## 2018-04-05 DIAGNOSIS — E7800 Pure hypercholesterolemia, unspecified: Secondary | ICD-10-CM | POA: Diagnosis not present

## 2018-04-05 DIAGNOSIS — Z8673 Personal history of transient ischemic attack (TIA), and cerebral infarction without residual deficits: Secondary | ICD-10-CM | POA: Diagnosis not present

## 2018-04-05 DIAGNOSIS — I2102 ST elevation (STEMI) myocardial infarction involving left anterior descending coronary artery: Secondary | ICD-10-CM | POA: Diagnosis not present

## 2018-04-07 ENCOUNTER — Encounter (HOSPITAL_COMMUNITY): Payer: Medicare Other

## 2018-04-07 ENCOUNTER — Encounter (HOSPITAL_COMMUNITY)
Admission: RE | Admit: 2018-04-07 | Discharge: 2018-04-07 | Disposition: A | Discharge status: Home / Self Care | Payer: Medicare Other | Source: Ambulatory Visit | Attending: Cardiology | Admitting: Cardiology

## 2018-04-07 DIAGNOSIS — Z955 Presence of coronary angioplasty implant and graft: Secondary | ICD-10-CM

## 2018-04-07 DIAGNOSIS — Z8673 Personal history of transient ischemic attack (TIA), and cerebral infarction without residual deficits: Secondary | ICD-10-CM | POA: Diagnosis not present

## 2018-04-07 DIAGNOSIS — I10 Essential (primary) hypertension: Secondary | ICD-10-CM | POA: Diagnosis not present

## 2018-04-07 DIAGNOSIS — I2102 ST elevation (STEMI) myocardial infarction involving left anterior descending coronary artery: Secondary | ICD-10-CM

## 2018-04-07 DIAGNOSIS — I2510 Atherosclerotic heart disease of native coronary artery without angina pectoris: Secondary | ICD-10-CM | POA: Diagnosis not present

## 2018-04-07 DIAGNOSIS — E7800 Pure hypercholesterolemia, unspecified: Secondary | ICD-10-CM | POA: Diagnosis not present

## 2018-04-09 ENCOUNTER — Encounter (HOSPITAL_COMMUNITY)
Admission: RE | Admit: 2018-04-09 | Discharge: 2018-04-09 | Disposition: A | Discharge status: Home / Self Care | Payer: Medicare Other | Source: Ambulatory Visit | Attending: Cardiology | Admitting: Cardiology

## 2018-04-09 ENCOUNTER — Encounter (HOSPITAL_COMMUNITY): Payer: Medicare Other

## 2018-04-09 DIAGNOSIS — Z955 Presence of coronary angioplasty implant and graft: Secondary | ICD-10-CM | POA: Diagnosis not present

## 2018-04-09 DIAGNOSIS — I10 Essential (primary) hypertension: Secondary | ICD-10-CM | POA: Diagnosis not present

## 2018-04-09 DIAGNOSIS — I2102 ST elevation (STEMI) myocardial infarction involving left anterior descending coronary artery: Secondary | ICD-10-CM | POA: Diagnosis not present

## 2018-04-09 DIAGNOSIS — E7800 Pure hypercholesterolemia, unspecified: Secondary | ICD-10-CM | POA: Diagnosis not present

## 2018-04-09 DIAGNOSIS — I2510 Atherosclerotic heart disease of native coronary artery without angina pectoris: Secondary | ICD-10-CM | POA: Diagnosis not present

## 2018-04-09 DIAGNOSIS — Z8673 Personal history of transient ischemic attack (TIA), and cerebral infarction without residual deficits: Secondary | ICD-10-CM | POA: Diagnosis not present

## 2018-04-12 ENCOUNTER — Encounter (HOSPITAL_COMMUNITY)
Admission: RE | Admit: 2018-04-12 | Discharge: 2018-04-12 | Disposition: A | Discharge status: Home / Self Care | Payer: Medicare Other | Source: Ambulatory Visit | Attending: Cardiology | Admitting: Cardiology

## 2018-04-12 ENCOUNTER — Encounter (HOSPITAL_COMMUNITY): Payer: Medicare Other

## 2018-04-12 DIAGNOSIS — I2102 ST elevation (STEMI) myocardial infarction involving left anterior descending coronary artery: Secondary | ICD-10-CM | POA: Diagnosis not present

## 2018-04-12 DIAGNOSIS — E7800 Pure hypercholesterolemia, unspecified: Secondary | ICD-10-CM | POA: Diagnosis not present

## 2018-04-12 DIAGNOSIS — Z8673 Personal history of transient ischemic attack (TIA), and cerebral infarction without residual deficits: Secondary | ICD-10-CM | POA: Diagnosis not present

## 2018-04-12 DIAGNOSIS — Z955 Presence of coronary angioplasty implant and graft: Secondary | ICD-10-CM | POA: Diagnosis not present

## 2018-04-12 DIAGNOSIS — I10 Essential (primary) hypertension: Secondary | ICD-10-CM | POA: Diagnosis not present

## 2018-04-12 DIAGNOSIS — I2510 Atherosclerotic heart disease of native coronary artery without angina pectoris: Secondary | ICD-10-CM | POA: Diagnosis not present

## 2018-04-14 ENCOUNTER — Encounter (HOSPITAL_COMMUNITY): Payer: Medicare Other

## 2018-04-14 ENCOUNTER — Encounter (HOSPITAL_COMMUNITY)
Admission: RE | Admit: 2018-04-14 | Discharge: 2018-04-14 | Disposition: A | Discharge status: Home / Self Care | Payer: Medicare Other | Source: Ambulatory Visit | Attending: Cardiology | Admitting: Cardiology

## 2018-04-14 DIAGNOSIS — E7800 Pure hypercholesterolemia, unspecified: Secondary | ICD-10-CM | POA: Diagnosis not present

## 2018-04-14 DIAGNOSIS — Z955 Presence of coronary angioplasty implant and graft: Secondary | ICD-10-CM | POA: Diagnosis not present

## 2018-04-14 DIAGNOSIS — I2102 ST elevation (STEMI) myocardial infarction involving left anterior descending coronary artery: Secondary | ICD-10-CM | POA: Diagnosis not present

## 2018-04-14 DIAGNOSIS — I10 Essential (primary) hypertension: Secondary | ICD-10-CM | POA: Diagnosis not present

## 2018-04-14 DIAGNOSIS — I2510 Atherosclerotic heart disease of native coronary artery without angina pectoris: Secondary | ICD-10-CM | POA: Diagnosis not present

## 2018-04-14 DIAGNOSIS — Z8673 Personal history of transient ischemic attack (TIA), and cerebral infarction without residual deficits: Secondary | ICD-10-CM | POA: Diagnosis not present

## 2018-04-15 DIAGNOSIS — N189 Chronic kidney disease, unspecified: Secondary | ICD-10-CM | POA: Diagnosis not present

## 2018-04-15 DIAGNOSIS — D631 Anemia in chronic kidney disease: Secondary | ICD-10-CM | POA: Diagnosis not present

## 2018-04-15 DIAGNOSIS — N2581 Secondary hyperparathyroidism of renal origin: Secondary | ICD-10-CM | POA: Diagnosis not present

## 2018-04-15 DIAGNOSIS — N183 Chronic kidney disease, stage 3 (moderate): Secondary | ICD-10-CM | POA: Diagnosis not present

## 2018-04-15 DIAGNOSIS — I129 Hypertensive chronic kidney disease with stage 1 through stage 4 chronic kidney disease, or unspecified chronic kidney disease: Secondary | ICD-10-CM | POA: Diagnosis not present

## 2018-04-16 ENCOUNTER — Encounter (HOSPITAL_COMMUNITY): Payer: Medicare Other

## 2018-04-16 ENCOUNTER — Encounter (HOSPITAL_COMMUNITY)
Admission: RE | Admit: 2018-04-16 | Discharge: 2018-04-16 | Disposition: A | Discharge status: Home / Self Care | Payer: Medicare Other | Source: Ambulatory Visit | Attending: Cardiology | Admitting: Cardiology

## 2018-04-16 DIAGNOSIS — I10 Essential (primary) hypertension: Secondary | ICD-10-CM | POA: Diagnosis not present

## 2018-04-16 DIAGNOSIS — I2102 ST elevation (STEMI) myocardial infarction involving left anterior descending coronary artery: Secondary | ICD-10-CM | POA: Diagnosis not present

## 2018-04-16 DIAGNOSIS — Z8673 Personal history of transient ischemic attack (TIA), and cerebral infarction without residual deficits: Secondary | ICD-10-CM | POA: Diagnosis not present

## 2018-04-16 DIAGNOSIS — E7800 Pure hypercholesterolemia, unspecified: Secondary | ICD-10-CM | POA: Diagnosis not present

## 2018-04-16 DIAGNOSIS — I2510 Atherosclerotic heart disease of native coronary artery without angina pectoris: Secondary | ICD-10-CM | POA: Diagnosis not present

## 2018-04-16 DIAGNOSIS — Z955 Presence of coronary angioplasty implant and graft: Secondary | ICD-10-CM | POA: Diagnosis not present

## 2018-04-19 ENCOUNTER — Encounter (HOSPITAL_COMMUNITY): Payer: Medicare Other

## 2018-04-19 ENCOUNTER — Encounter (HOSPITAL_COMMUNITY)
Admission: RE | Admit: 2018-04-19 | Discharge: 2018-04-19 | Disposition: A | Discharge status: Home / Self Care | Payer: Medicare Other | Source: Ambulatory Visit | Attending: Cardiology | Admitting: Cardiology

## 2018-04-19 DIAGNOSIS — I2102 ST elevation (STEMI) myocardial infarction involving left anterior descending coronary artery: Secondary | ICD-10-CM

## 2018-04-19 DIAGNOSIS — Z8673 Personal history of transient ischemic attack (TIA), and cerebral infarction without residual deficits: Secondary | ICD-10-CM | POA: Diagnosis not present

## 2018-04-19 DIAGNOSIS — I10 Essential (primary) hypertension: Secondary | ICD-10-CM | POA: Diagnosis not present

## 2018-04-19 DIAGNOSIS — I2510 Atherosclerotic heart disease of native coronary artery without angina pectoris: Secondary | ICD-10-CM | POA: Diagnosis not present

## 2018-04-19 DIAGNOSIS — Z955 Presence of coronary angioplasty implant and graft: Secondary | ICD-10-CM | POA: Diagnosis not present

## 2018-04-19 DIAGNOSIS — E7800 Pure hypercholesterolemia, unspecified: Secondary | ICD-10-CM | POA: Diagnosis not present

## 2018-04-21 ENCOUNTER — Encounter (HOSPITAL_COMMUNITY): Payer: Medicare Other

## 2018-04-21 ENCOUNTER — Encounter (HOSPITAL_COMMUNITY)
Admission: RE | Admit: 2018-04-21 | Discharge: 2018-04-21 | Disposition: A | Discharge status: Home / Self Care | Payer: Medicare Other | Source: Ambulatory Visit | Attending: Cardiology | Admitting: Cardiology

## 2018-04-21 DIAGNOSIS — I10 Essential (primary) hypertension: Secondary | ICD-10-CM | POA: Diagnosis not present

## 2018-04-21 DIAGNOSIS — I2102 ST elevation (STEMI) myocardial infarction involving left anterior descending coronary artery: Secondary | ICD-10-CM | POA: Diagnosis not present

## 2018-04-21 DIAGNOSIS — Z8673 Personal history of transient ischemic attack (TIA), and cerebral infarction without residual deficits: Secondary | ICD-10-CM | POA: Diagnosis not present

## 2018-04-21 DIAGNOSIS — E7800 Pure hypercholesterolemia, unspecified: Secondary | ICD-10-CM | POA: Diagnosis not present

## 2018-04-21 DIAGNOSIS — Z955 Presence of coronary angioplasty implant and graft: Secondary | ICD-10-CM

## 2018-04-21 DIAGNOSIS — I2510 Atherosclerotic heart disease of native coronary artery without angina pectoris: Secondary | ICD-10-CM | POA: Diagnosis not present

## 2018-04-23 ENCOUNTER — Encounter (HOSPITAL_COMMUNITY): Payer: Medicare Other

## 2018-04-23 ENCOUNTER — Encounter (HOSPITAL_COMMUNITY)
Admission: RE | Admit: 2018-04-23 | Discharge: 2018-04-23 | Disposition: A | Discharge status: Home / Self Care | Payer: Medicare Other | Source: Ambulatory Visit | Attending: Cardiology | Admitting: Cardiology

## 2018-04-23 DIAGNOSIS — Z955 Presence of coronary angioplasty implant and graft: Secondary | ICD-10-CM | POA: Diagnosis not present

## 2018-04-23 DIAGNOSIS — E7800 Pure hypercholesterolemia, unspecified: Secondary | ICD-10-CM | POA: Diagnosis not present

## 2018-04-23 DIAGNOSIS — I10 Essential (primary) hypertension: Secondary | ICD-10-CM | POA: Diagnosis not present

## 2018-04-23 DIAGNOSIS — I2102 ST elevation (STEMI) myocardial infarction involving left anterior descending coronary artery: Secondary | ICD-10-CM

## 2018-04-23 DIAGNOSIS — Z8673 Personal history of transient ischemic attack (TIA), and cerebral infarction without residual deficits: Secondary | ICD-10-CM | POA: Diagnosis not present

## 2018-04-23 DIAGNOSIS — I2510 Atherosclerotic heart disease of native coronary artery without angina pectoris: Secondary | ICD-10-CM | POA: Diagnosis not present

## 2018-04-26 ENCOUNTER — Encounter (HOSPITAL_COMMUNITY): Payer: Medicare Other

## 2018-04-28 ENCOUNTER — Encounter (HOSPITAL_COMMUNITY)
Admission: RE | Admit: 2018-04-28 | Discharge: 2018-04-28 | Disposition: A | Discharge status: Home / Self Care | Payer: Medicare Other | Source: Ambulatory Visit | Attending: Cardiology | Admitting: Cardiology

## 2018-04-28 ENCOUNTER — Encounter (HOSPITAL_COMMUNITY): Payer: Medicare Other

## 2018-04-28 DIAGNOSIS — I2510 Atherosclerotic heart disease of native coronary artery without angina pectoris: Secondary | ICD-10-CM | POA: Diagnosis not present

## 2018-04-28 DIAGNOSIS — E7800 Pure hypercholesterolemia, unspecified: Secondary | ICD-10-CM | POA: Diagnosis not present

## 2018-04-28 DIAGNOSIS — I10 Essential (primary) hypertension: Secondary | ICD-10-CM | POA: Diagnosis not present

## 2018-04-28 DIAGNOSIS — I2102 ST elevation (STEMI) myocardial infarction involving left anterior descending coronary artery: Secondary | ICD-10-CM

## 2018-04-28 DIAGNOSIS — Z955 Presence of coronary angioplasty implant and graft: Secondary | ICD-10-CM | POA: Diagnosis not present

## 2018-04-28 DIAGNOSIS — Z8673 Personal history of transient ischemic attack (TIA), and cerebral infarction without residual deficits: Secondary | ICD-10-CM | POA: Diagnosis not present

## 2018-04-29 NOTE — Progress Notes (Signed)
Cardiac Individual Treatment Plan  Patient Details  Name: Paul Kramer MRN: [id number] Date of Birth: 01/18/1930 Referring Provider:     [facility] from 02/04/2018 in [place]  Referring Provider  Leonie Man MD       Initial Encounter Date:    CARDIAC REHAB PHASE II ORIENTATION from 02/04/2018 in [place]  Date  02/04/18      Visit Diagnosis: ST elevation myocardial infarction involving left anterior descending (LAD) coronary artery [facility])  Status post coronary artery stent placement  Patient's Home Medications on Admission:  Current Outpatient Medications:  .  acetaminophen (TYLENOL) 325 MG tablet, Take 325 mg by mouth every 6 (six) hours as needed for mild pain., Disp: , Rfl:  .  allopurinol (ZYLOPRIM) 100 MG tablet, Take 100 mg by mouth every morning., Disp: , Rfl:  .  amiodarone (PACERONE) 100 MG tablet, Take 1 tablet (100 mg total) by mouth daily., Disp: 90 tablet, Rfl: 3 .  aspirin EC 81 MG tablet, Take 81 mg by mouth daily., Disp: , Rfl:  .  atorvastatin (LIPITOR) 40 MG tablet, Take 1 tablet (40 mg total) by mouth daily at 6 PM., Disp: 90 tablet, Rfl: 3 .  clopidogrel (PLAVIX) 75 MG tablet, Take 1 tablet (75 mg total) by mouth daily., Disp: 90 tablet, Rfl: 3 .  cyanocobalamin 1000 MCG tablet, Take 1 tablet (1,000 mcg total) by mouth daily., Disp: 90 tablet, Rfl: 3 .  Multiple Vitamins-Minerals (CENTRUM SILVER PO), Take 1 tablet by mouth daily., Disp: , Rfl:  .  nitroGLYCERIN (NITROSTAT) 0.4 MG SL tablet, Place 1 tablet (0.4 mg total) under the tongue every 5 (five) minutes x 3 doses as needed for chest pain., Disp: 25 tablet, Rfl: 3 .  pantoprazole (PROTONIX) 40 MG tablet, Take 1 tablet (40 mg total) by mouth daily., Disp: 90 tablet, Rfl: 3 .  Psyllium (METAMUCIL FIBER PO), Take by mouth., Disp: , Rfl:  .  terazosin (HYTRIN) 2 MG capsule, Take 4 mg by mouth every evening., Disp: ,  Rfl:  .  VITAMIN D, ERGOCALCIFEROL, PO, Take 5,000 Units by mouth daily. , Disp: , Rfl:   Past Medical History: Past Medical History:  Diagnosis Date  . AAA (abdominal aortic aneurysm) ([place])   . Coronary artery disease   . Hypercholesteremia   . Hypertension   . Myocardial infarction of inferolateral wall ([place]) 11/2017   ? prior Infarct; - Cath 11/29/17 - 3 V CAD (CTO RCA), 95% m-d Cx (@ OM2 55%)-> PCI Cx-PTCA Om2; 85% p-m LAD => DES PCI   . Narcolepsy    pt may pass out when laughing  . Spinal stenosis   . TIA (transient ischemic attack)     Tobacco Use: Social History   Tobacco Use  Smoking Status Former Smoker  Smokeless Tobacco Never Used    Labs: Recent [employer] for ITP Cardiac and Pulmonary Rehab Latest Ref Rng & Units 11/30/2017   Cholestrol 0 - 200 mg/dL 137   LDLCALC 0 - 99 mg/dL 80   HDL >40 mg/dL 38(L)   Trlycerides <150 mg/dL 93   Hemoglobin A1c 4.8 - 5.6 % 5.7(H)      Capillary Blood Glucose: Lab Results  Component Value Date   GLUCAP 91 12/03/2017     Exercise Target Goals: Exercise Program Goal: Individual exercise prescription set using results from initial 6 min walk test and THRR while  considering  patient's activity barriers and safety.   Exercise Prescription Goal: Initial exercise prescription builds to 30-45 minutes a day of aerobic activity, 2-3 days per week.  Home exercise guidelines will be given to patient during program as part of exercise prescription that the participant will acknowledge.  Activity Barriers & Risk Stratification: Activity Barriers & Cardiac Risk Stratification - 02/04/18 1230      Activity Barriers & Cardiac Risk Stratification   Activity Barriers  Back Problems;Other (comment)    Comments  Chronic Right Knee Pain " No Cartilage", [place]: may pass out from laughing     Cardiac Risk Stratification  High       6 Minute Walk: 6 Minute Walk    Row Name 02/04/18 1212         6 Minute Walk    Phase  Initial     Distance  1459 feet     Walk Time  6 minutes     # of Rest Breaks  0     MPH  2.76     METS  1.86     RPE  9     Perceived Dyspnea   0     VO2 Peak  6.49     Symptoms  No     Resting HR  56 bpm     Resting BP  122/60     Resting Oxygen Saturation   97 %     Exercise Oxygen Saturation  during 6 min walk  98 %     Max Ex. HR  62 bpm     Max Ex. BP  138/80     2 Minute Post BP  138/78        Oxygen Initial Assessment:   Oxygen Re-Evaluation:   Oxygen Discharge (Final Oxygen Re-Evaluation):   Initial Exercise Prescription: Initial Exercise Prescription - 02/04/18 1200      Date of Initial Exercise RX and Referring Provider   Date  02/04/18    Referring Provider  Leonie Man MD     Expected Discharge Date  04/30/18      NuStep   Level  2    SPM  75    Minutes  15    METs  1.8      Track   Laps  12    Minutes  10    METs  2.39      Prescription Details   Frequency (times per week)  3x    Duration  Progress to 30 minutes of continuous aerobic without signs/symptoms of physical distress      Intensity   THRR 40-80% of Max Heartrate  54-108    Ratings of Perceived Exertion  11-13    Perceived Dyspnea  0-4      Progression   Progression  Continue progressive overload as per policy without signs/symptoms or physical distress.      Resistance Training   Training Prescription  Yes    Weight  3lbs    Reps  10-15       Perform Capillary Blood Glucose checks as needed.  Exercise Prescription Changes: Exercise Prescription Changes    Row Name 02/08/18 0958 02/22/18 0954 03/08/18 0946 03/22/18 0947 04/05/18 0951     Response to Exercise   Blood Pressure (Admit)  118/60  116/70  104/58  122/52  106/50   Blood Pressure (Exercise)  138/72  132/62  140/50  122/60  140/60   Blood Pressure (Exit)  130/70  122/60  130/60  103/52  118/70   Heart Rate (Admit)  62 bpm  72 bpm  84 bpm  72 bpm  61 bpm   Heart Rate (Exercise)  99 bpm  81 bpm   93 bpm  94 bpm  81 bpm   Heart Rate (Exit)  55 bpm  62 bpm  65 bpm  54 bpm  56 bpm   Rating of Perceived Exertion (Exercise)  11  12  11  11  12    Symptoms  none  none  none  none  none   Duration  Progress to 30 minutes of  aerobic without signs/symptoms of physical distress  Progress to 30 minutes of  aerobic without signs/symptoms of physical distress  Progress to 30 minutes of  aerobic without signs/symptoms of physical distress  Progress to 30 minutes of  aerobic without signs/symptoms of physical distress  Progress to 30 minutes of  aerobic without signs/symptoms of physical distress   Intensity  THRR unchanged  THRR unchanged  THRR unchanged  THRR unchanged  THRR unchanged     Progression   Progression  Continue to progress workloads to maintain intensity without signs/symptoms of physical distress.  Continue to progress workloads to maintain intensity without signs/symptoms of physical distress.  Continue to progress workloads to maintain intensity without signs/symptoms of physical distress.  Continue to progress workloads to maintain intensity without signs/symptoms of physical distress.  Continue to progress workloads to maintain intensity without signs/symptoms of physical distress.   Average METs  2.4  2  2.9  2.5  2.7     Resistance Training   Training Prescription  Yes  Yes  Yes  Yes  Yes   Weight  3lbs  3lbs  3lbs  3lbs  3lbs   Reps  10-15  10-15  10-15  10-15  10-15   Time  10 Minutes  10 Minutes  10 Minutes  10 Minutes  10 Minutes     Interval Training   Interval Training  No  No  No  No  No     NuStep   Level  2  2  3  4  4    SPM  85  85  85  85  85   Minutes  15  15  15  15  15    METs  2.6  2.6  2.9  2.5  2.7     Arm Ergometer   Level  -  1  1  1  1    Minutes  -  15  15  15  15      Track   Laps  10  -  -  -  -   Minutes  15  -  -  -  -   METs  2.16  -  -  -  -     Home Exercise Plan   Plans to continue exercise at  -  -  Home (comment)  Home (comment)  Home  (comment)   Frequency  -  -  Add 4 additional days to program exercise sessions.  Add 4 additional days to program exercise sessions.  Add 4 additional days to program exercise sessions.   Initial Home Exercises Provided  -  -  02/26/18  02/26/18  02/26/18   Row Name 04/19/18 0953             Response to Exercise   Blood Pressure (Admit)  108/52  Blood Pressure (Exercise)  124/60       Blood Pressure (Exit)  100/62       Heart Rate (Admit)  65 bpm       Heart Rate (Exercise)  93 bpm       Heart Rate (Exit)  65 bpm       Rating of Perceived Exertion (Exercise)  11       Symptoms  none       Duration  Progress to 30 minutes of  aerobic without signs/symptoms of physical distress       Intensity  THRR unchanged         Progression   Progression  Continue to progress workloads to maintain intensity without signs/symptoms of physical distress.       Average METs  3.3         Resistance Training   Training Prescription  Yes       Weight  3lbs       Reps  10-15       Time  10 Minutes         Interval Training   Interval Training  No         NuStep   Level  4       SPM  85       Minutes  15       METs  3.3         Arm Ergometer   Level  1       Minutes  15         Home Exercise Plan   Plans to continue exercise at  Home (comment)       Frequency  Add 4 additional days to program exercise sessions.       Initial Home Exercises Provided  02/26/18          Exercise Comments: Exercise Comments    Row Name 02/08/18 1047 02/22/18 1030 02/26/18 1017 03/10/18 1023 03/17/18 1050   Exercise Comments  Patient tolerated 1st session of exercie well without c/o.  Reviewed METs and goals with patient.  Home exercise completed  Reviewed METs with patient.  Patient states that he's been cleared to resume swimming, and he was able to swim 8 laps in 30 minutes yesterday without any issues. Pt plans to go swimming again tomorrow. Pt states he was previously swimming 2-3 days/week. Pt  seems quite please to return to swimming.   [place] Name 03/24/18 1000 04/07/18 1027 04/21/18 1021       Exercise Comments  Reviewed METs and goals with patient.  Reviewed METs with patient.  Reviewed METs and goals with patient.        Exercise Goals and Review: Exercise Goals    Row Name 02/04/18 1214             Exercise Goals   Increase Physical Activity  Yes       Intervention  Provide advice, education, support and counseling about physical activity/exercise needs.;Develop an individualized exercise prescription for aerobic and resistive training based on initial evaluation findings, risk stratification, comorbidities and participant's personal goals.       Expected Outcomes  Short Term: Attend rehab on a regular basis to increase amount of physical activity.;Long Term: Add in home exercise to make exercise part of routine and to increase amount of physical activity.;Long Term: Exercising regularly at least 3-5 days a week.       Increase Strength and Stamina  Yes  Intervention  Provide advice, education, support and counseling about physical activity/exercise needs.;Develop an individualized exercise prescription for aerobic and resistive training based on initial evaluation findings, risk stratification, comorbidities and participant's personal goals.       Expected Outcomes  Short Term: Perform resistance training exercises routinely during rehab and add in resistance training at home;Short Term: Increase workloads from initial exercise prescription for resistance, speed, and METs.;Long Term: Improve cardiorespiratory fitness, muscular endurance and strength as measured by increased METs and functional capacity (6MWT)       Able to understand and use rate of perceived exertion (RPE) scale  Yes       Intervention  Provide education and explanation on how to use RPE scale       Expected Outcomes  Short Term: Able to use RPE daily in rehab to express subjective intensity level;Long  Term:  Able to use RPE to guide intensity level when exercising independently       Knowledge and understanding of Target Heart Rate Range (THRR)  Yes       Intervention  Provide education and explanation of THRR including how the numbers were predicted and where they are located for reference       Expected Outcomes  Short Term: Able to state/look up THRR;Short Term: Able to use daily as guideline for intensity in rehab;Long Term: Able to use THRR to govern intensity when exercising independently       Able to check pulse independently  Yes       Intervention  Provide education and demonstration on how to check pulse in carotid and radial arteries.;Review the importance of being able to check your own pulse for safety during independent exercise       Expected Outcomes  Short Term: Able to explain why pulse checking is important during independent exercise;Long Term: Able to check pulse independently and accurately       Understanding of Exercise Prescription  Yes       Intervention  Provide education, explanation, and written materials on patient's individual exercise prescription       Expected Outcomes  Short Term: Able to explain program exercise prescription;Long Term: Able to explain home exercise prescription to exercise independently          Exercise Goals Re-Evaluation : Exercise Goals Re-Evaluation    Row Name 02/08/18 1047 02/22/18 1030 03/24/18 1000 04/21/18 1021       Exercise Goal Re-Evaluation   Exercise Goals Review  Able to understand and use rate of perceived exertion (RPE) scale  Increase Physical Activity  Increase Physical Activity  Increase Physical Activity;Able to understand and use rate of perceived exertion (RPE) scale;Understanding of Exercise Prescription;Increase Strength and Stamina    Comments  Patient able to understand and use RPE scale sppropriately.  Patient states that he's walking 0.6 miles twice/day, 4 days/ week in addition to exercise at cardiac rehab.    In addition to walking 0.6 of a mile BID, pt is now swimming 10 laps on Tuesdays, Wednesdays, and Thursdays.  Patient continues to walk daily and swim 12 laps in 20 minutes 3 days/week.    Expected Outcomes  Increase workloads as tolerated.  Continue to progress workloads to help improve strength and stamina.  Patient will continue walking and swimming routine to help improve cardiorespiratory fitness.  Patient will continue current exercise routine to help achieve personal health and fitness goals.       Discharge Exercise Prescription (Final Exercise Prescription Changes): Exercise Prescription Changes -  04/19/18 0953      Response to Exercise   Blood Pressure (Admit)  108/52    Blood Pressure (Exercise)  124/60    Blood Pressure (Exit)  100/62    Heart Rate (Admit)  65 bpm    Heart Rate (Exercise)  93 bpm    Heart Rate (Exit)  65 bpm    Rating of Perceived Exertion (Exercise)  11    Symptoms  none    Duration  Progress to 30 minutes of  aerobic without signs/symptoms of physical distress    Intensity  THRR unchanged      Progression   Progression  Continue to progress workloads to maintain intensity without signs/symptoms of physical distress.    Average METs  3.3      Resistance Training   Training Prescription  Yes    Weight  3lbs    Reps  10-15    Time  10 Minutes      Interval Training   Interval Training  No      NuStep   Level  4    SPM  85    Minutes  15    METs  3.3      Arm Ergometer   Level  1    Minutes  15      Home Exercise Plan   Plans to continue exercise at  Home (comment)    Frequency  Add 4 additional days to program exercise sessions.    Initial Home Exercises Provided  02/26/18       Nutrition:  Target Goals: Understanding of nutrition guidelines, daily intake of sodium 1500mg , cholesterol 200mg , calories 30% from fat and 7% or less from saturated fats, daily to have 5 or more servings of fruits and vegetables.  Biometrics: Pre Biometrics  - 02/04/18 1214      Pre Biometrics   Height  5\' 5"  (1.651 m)    Weight  81.4 kg    Waist Circumference  41 inches    Hip Circumference  42 inches    Waist to Hip Ratio  0.98 %    BMI (Calculated)  29.86    Triceps Skinfold  26 mm    % Body Fat  32.1 %    Grip Strength  33 kg    Flexibility  12.5 in    Single Leg Stand  0 seconds        Nutrition Therapy Plan and Nutrition Goals: Nutrition Therapy & Goals - 02/04/18 1035      Nutrition Therapy   Diet  consistent carbohydrate heart healthy      Personal Nutrition Goals   Nutrition Goal  Pt to identify and limit food sources of saturated fat, trans fat, sodium, refined carbohydrates    Personal Goal #2  Pt to identify food quantities necessary to achieve weight loss of 6-24 lbs. at graduation from cardiac rehab.    Personal Goal #3  Pt able to name foods that affect blood glucose.      Intervention Plan   Intervention  Prescribe, educate and counsel regarding individualized specific dietary modifications aiming towards targeted core components such as weight, hypertension, lipid management, diabetes, heart failure and other comorbidities.    Expected Outcomes  Short Term Goal: Understand basic principles of dietary content, such as calories, fat, sodium, cholesterol and nutrients.       Nutrition Assessments: Nutrition Assessments - 02/04/18 1036      MEDFICTS Scores   Pre Score  12  Nutrition Goals Re-Evaluation: Nutrition Goals Re-Evaluation    [place] Name 02/04/18 1035             Goals   Current Weight  179 lb 7.3 oz (81.4 kg)          Nutrition Goals Re-Evaluation: Nutrition Goals Re-Evaluation    [place] Name 02/04/18 1035             Goals   Current Weight  179 lb 7.3 oz (81.4 kg)          Nutrition Goals Discharge (Final Nutrition Goals Re-Evaluation): Nutrition Goals Re-Evaluation - 02/04/18 1035      Goals   Current Weight  179 lb 7.3 oz (81.4 kg)       Psychosocial: Target Goals:  Acknowledge presence or absence of significant depression and/or stress, maximize coping skills, provide positive support system. Participant is able to verbalize types and ability to use techniques and skills needed for reducing stress and depression.  Initial Review & Psychosocial Screening: Initial Psych Review & Screening - 02/04/18 1038      Initial Review   Current issues with  None Identified      Family Dynamics   Good Support System?  Yes   [place] lists his daughter and son-in-law as sources of support.  His daughter was present for orientation.      Barriers   Psychosocial barriers to participate in program  There are no identifiable barriers or psychosocial needs.      Screening Interventions   Interventions  Encouraged to exercise       Quality of Life Scores: Quality of Life - 02/04/18 1045      Quality of Life   Select  Quality of Life      Quality of Life Scores   Health/Function Pre  27.63 %    Socioeconomic Pre  30 %    Psych/Spiritual Pre  30 %    Family Pre  30 %    GLOBAL Pre  28.89 %      Scores of 19 and below usually indicate a poorer quality of life in these areas.  A difference of  2-3 points is a clinically meaningful difference.  A difference of 2-3 points in the total score of the Quality of Life Index has been associated with significant improvement in overall quality of life, self-image, physical symptoms, and general health in studies assessing change in quality of life.  PHQ-9: Recent Review Flowsheet Data    Depression screen [facility] 2/9 02/08/2018   Decreased Interest 0   Down, Depressed, Hopeless 0   PHQ - 2 Score 0     Interpretation of Total Score  Total Score Depression Severity:  1-4 = Minimal depression, 5-9 = Mild depression, 10-14 = Moderate depression, 15-19 = Moderately severe depression, 20-27 = Severe depression   Psychosocial Evaluation and Intervention:   Psychosocial Re-Evaluation: Psychosocial Re-Evaluation    [place] Name  03/04/18 1417 04/01/18 1137 04/29/18 1130         Psychosocial Re-Evaluation   Current issues with  None Identified  None Identified  None Identified     Interventions  Encouraged to attend Cardiac Rehabilitation for the exercise  Encouraged to attend Cardiac Rehabilitation for the exercise  Encouraged to attend Cardiac Rehabilitation for the exercise     Continue Psychosocial Services   No Follow up required  No Follow up required  No Follow up required        Psychosocial Discharge (Final Psychosocial Re-Evaluation):  Psychosocial Re-Evaluation - 04/29/18 1130      Psychosocial Re-Evaluation   Current issues with  None Identified    Interventions  Encouraged to attend Cardiac Rehabilitation for the exercise    Continue Psychosocial Services   No Follow up required       Vocational Rehabilitation: Provide vocational rehab assistance to qualifying candidates.   Vocational Rehab Evaluation & Intervention: Vocational Rehab - 02/04/18 1038      Initial Vocational Rehab Evaluation & Intervention   Assessment shows need for Vocational Rehabilitation  No       Education: Education Goals: Education classes will be provided on a weekly basis, covering required topics. Participant will state understanding/return demonstration of topics presented.  Learning Barriers/Preferences: Learning Barriers/Preferences - 02/04/18 1227      Learning Barriers/Preferences   Learning Barriers  Sight;Hearing    Learning Preferences  Written Material;Video;Skilled Demonstration       Education Topics: Count Your Pulse:  -Group instruction provided by verbal instruction, demonstration, patient participation and written materials to support subject.  Instructors address importance of being able to find your pulse and how to count your pulse when at home without a heart monitor.  Patients get hands on experience counting their pulse with staff help and individually.   Heart Attack, Angina, and Risk  Factor Modification:  -Group instruction provided by verbal instruction, video, and written materials to support subject.  Instructors address signs and symptoms of angina and heart attacks.    Also discuss risk factors for heart disease and how to make changes to improve heart health risk factors.   Functional Fitness:  -Group instruction provided by verbal instruction, demonstration, patient participation, and written materials to support subject.  Instructors address safety measures for doing things around the house.  Discuss how to get up and down off the floor, how to pick things up properly, how to safely get out of a chair without assistance, and balance training.   Meditation and Mindfulness:  -Group instruction provided by verbal instruction, patient participation, and written materials to support subject.  Instructor addresses importance of mindfulness and meditation practice to help reduce stress and improve awareness.  Instructor also leads participants through a meditation exercise.    Stretching for Flexibility and Mobility:  -Group instruction provided by verbal instruction, patient participation, and written materials to support subject.  Instructors lead participants through series of stretches that are designed to increase flexibility thus improving mobility.  These stretches are additional exercise for major muscle groups that are typically performed during regular warm up and cool down.   Hands Only CPR:  -Group verbal, video, and participation provides a basic overview of AHA guidelines for community CPR. Role-play of emergencies allow participants the opportunity to practice calling for help and chest compression technique with discussion of AED use.   Hypertension: -Group verbal and written instruction that provides a basic overview of hypertension including the most recent diagnostic guidelines, risk factor reduction with self-care instructions and medication  management.    Nutrition I class: Heart Healthy Eating:  -Group instruction provided by PowerPoint slides, verbal discussion, and written materials to support subject matter. The instructor gives an explanation and review of the Therapeutic Lifestyle Changes diet recommendations, which includes a discussion on lipid goals, dietary fat, sodium, fiber, plant stanol/sterol esters, sugar, and the components of a well-balanced, healthy diet.   Nutrition II class: Lifestyle Skills:  -Group instruction provided by PowerPoint slides, verbal discussion, and written materials to support subject matter. The instructor  gives an explanation and review of label reading, grocery shopping for heart health, heart healthy recipe modifications, and ways to make healthier choices when eating out.   Diabetes Question & Answer:  -Group instruction provided by PowerPoint slides, verbal discussion, and written materials to support subject matter. The instructor gives an explanation and review of diabetes co-morbidities, pre- and post-prandial blood glucose goals, pre-exercise blood glucose goals, signs, symptoms, and treatment of hypoglycemia and hyperglycemia, and foot care basics.   Diabetes Blitz:  -Group instruction provided by PowerPoint slides, verbal discussion, and written materials to support subject matter. The instructor gives an explanation and review of the physiology behind type 1 and type 2 diabetes, diabetes medications and rational behind using different medications, pre- and post-prandial blood glucose recommendations and Hemoglobin A1c goals, diabetes diet, and exercise including blood glucose guidelines for exercising safely.    Portion Distortion:  -Group instruction provided by PowerPoint slides, verbal discussion, written materials, and food models to support subject matter. The instructor gives an explanation of serving size versus portion size, changes in portions sizes over the last 20 years,  and what consists of a serving from each food group.   Stress Management:  -Group instruction provided by verbal instruction, video, and written materials to support subject matter.  Instructors review role of stress in heart disease and how to cope with stress positively.     Exercising on Your Own:  -Group instruction provided by verbal instruction, power point, and written materials to support subject.  Instructors discuss benefits of exercise, components of exercise, frequency and intensity of exercise, and end points for exercise.  Also discuss use of nitroglycerin and activating EMS.  Review options of places to exercise outside of rehab.  Review guidelines for sex with heart disease.   Cardiac Drugs I:  -Group instruction provided by verbal instruction and written materials to support subject.  Instructor reviews cardiac drug classes: antiplatelets, anticoagulants, beta blockers, and statins.  Instructor discusses reasons, side effects, and lifestyle considerations for each drug class.   Cardiac Drugs II:  -Group instruction provided by verbal instruction and written materials to support subject.  Instructor reviews cardiac drug classes: angiotensin converting enzyme inhibitors (ACE-I), angiotensin II receptor blockers (ARBs), nitrates, and calcium channel blockers.  Instructor discusses reasons, side effects, and lifestyle considerations for each drug class.   Anatomy and Physiology of the Circulatory System:  Group verbal and written instruction and models provide basic cardiac anatomy and physiology, with the coronary electrical and arterial systems. Review of: AMI, Angina, Valve disease, Heart Failure, Peripheral Artery Disease, Cardiac Arrhythmia, Pacemakers, and the ICD.   Other Education:  -Group or individual verbal, written, or video instructions that support the educational goals of the cardiac rehab program.   Holiday Eating Survival Tips:  -Group instruction provided by  PowerPoint slides, verbal discussion, and written materials to support subject matter. The instructor gives patients tips, tricks, and techniques to help them not only survive but enjoy the holidays despite the onslaught of food that accompanies the holidays.   Knowledge Questionnaire Score: Knowledge Questionnaire Score - 02/04/18 1038      Knowledge Questionnaire Score   Pre Score  20/24       Core Components/Risk Factors/Patient Goals at Admission: Personal Goals and Risk Factors at Admission - 02/04/18 1228      Core Components/Risk Factors/Patient Goals on Admission    Weight Management  Yes    Intervention  Weight Management: Develop a combined nutrition and exercise program designed to  reach desired caloric intake, while maintaining appropriate intake of nutrient and fiber, sodium and fats, and appropriate energy expenditure required for the weight goal.;Weight Management: Provide education and appropriate resources to help participant work on and attain dietary goals.;Weight Management/Obesity: Establish reasonable short term and long term weight goals.    Admit Weight  179 lb 7.3 oz (81.4 kg)    Goal Weight: Short Term  175 lb (79.4 kg)    Goal Weight: Long Term  169 lb (76.7 kg)    Expected Outcomes  Short Term: Continue to assess and modify interventions until short term weight is achieved;Long Term: Adherence to nutrition and physical activity/exercise program aimed toward attainment of established weight goal;Weight Loss: Understanding of general recommendations for a balanced deficit meal plan, which promotes 1-2 lb weight loss per week and includes a negative energy balance of [phone] kcal/d;Understanding recommendations for meals to include 15-35% energy as protein, 25-35% energy from fat, 35-60% energy from carbohydrates, less than 200mg  of dietary cholesterol, 20-35 gm of total fiber daily;Understanding of distribution of calorie intake throughout the day with the consumption  of 4-5 meals/snacks    Hypertension  Yes    Intervention  Provide education on lifestyle modifcations including regular physical activity/exercise, weight management, moderate sodium restriction and increased consumption of fresh fruit, vegetables, and low fat dairy, alcohol moderation, and smoking cessation.;Monitor prescription use compliance.    Expected Outcomes  Short Term: Continued assessment and intervention until BP is < 140/53mm HG in hypertensive participants. < 130/4mm HG in hypertensive participants with diabetes, heart failure or chronic kidney disease.;Long Term: Maintenance of blood pressure at goal levels.    Lipids  Yes    Intervention  Provide education and support for participant on nutrition & aerobic/resistive exercise along with prescribed medications to achieve LDL 70mg , HDL >40mg .    Expected Outcomes  Short Term: Participant states understanding of desired cholesterol values and is compliant with medications prescribed. Participant is following exercise prescription and nutrition guidelines.;Long Term: Cholesterol controlled with medications as prescribed, with individualized exercise RX and with personalized nutrition plan. Value goals: LDL < 70mg , HDL > 40 mg.       Core Components/Risk Factors/Patient Goals Review:  Goals and Risk Factor Review    Row Name 03/04/18 1418 04/01/18 1137 04/29/18 1130         Core Components/Risk Factors/Patient Goals Review   Personal Goals Review  Hypertension;Lipids  Hypertension;Lipids  Hypertension;Lipids     Review  Paul Kramer has been doing well with exercise at cardiac rehab. Paul Kramer's vital signs have been stable. Paul Kramer has maintained his current weight.  Paul Kramer has been doing well with exercise at cardiac rehab. Paul Kramer's vital signs have been stable. Paul Kramer has maintained his current weight.  Paul Kramer has been doing well with exercise at cardiac rehab. Paul Kramer's vital signs have been stable. Paul Kramer has maintained his current weight.     Expected Outcomes   Patient will continue to participate in exercise, nutrition and lifestyle modification opportunities  Patient will continue to participate in exercise, nutrition and lifestyle modification opportunities  Patient will continue to participate in exercise, nutrition and lifestyle modification opportunities        Core Components/Risk Factors/Patient Goals at Discharge (Final Review):  Goals and Risk Factor Review - 04/29/18 1130      Core Components/Risk Factors/Patient Goals Review   Personal Goals Review  Hypertension;Lipids    Review  Paul Kramer has been doing well with exercise at cardiac rehab. Paul Kramer's vital signs have been stable. Paul Kramer  has maintained his current weight.    Expected Outcomes  Patient will continue to participate in exercise, nutrition and lifestyle modification opportunities       ITP Comments: ITP Comments    Row Name 02/04/18 1210 03/04/18 1416 04/01/18 1136 04/29/18 1129     ITP Comments  Dr. Fransico Him, Medical Director   30 Day ITP Review. Patient with good participation and attendance in phase 2 cardiac rehab  30 Day ITP Review. Patient with good participation and attendance in phase 2 cardiac rehab  30 Day ITP Review. Patient with good participation and attendance in phase 2 cardiac rehab       Comments: See ITP comments. Paul Kramer is doing well with exercise and will be completing the program in the next few weeks.Barnet Pall, RN,BSN 04/29/2018 11:33 AM

## 2018-04-30 ENCOUNTER — Encounter (HOSPITAL_COMMUNITY)
Admission: RE | Admit: 2018-04-30 | Discharge: 2018-04-30 | Disposition: A | Discharge status: Home / Self Care | Payer: Medicare Other | Source: Ambulatory Visit | Attending: Cardiology | Admitting: Cardiology

## 2018-04-30 ENCOUNTER — Encounter (HOSPITAL_COMMUNITY): Payer: Medicare Other

## 2018-04-30 DIAGNOSIS — Z955 Presence of coronary angioplasty implant and graft: Secondary | ICD-10-CM | POA: Insufficient documentation

## 2018-04-30 DIAGNOSIS — I2510 Atherosclerotic heart disease of native coronary artery without angina pectoris: Secondary | ICD-10-CM | POA: Diagnosis not present

## 2018-04-30 DIAGNOSIS — Z8673 Personal history of transient ischemic attack (TIA), and cerebral infarction without residual deficits: Secondary | ICD-10-CM | POA: Insufficient documentation

## 2018-04-30 DIAGNOSIS — I714 Abdominal aortic aneurysm, without rupture: Secondary | ICD-10-CM | POA: Insufficient documentation

## 2018-04-30 DIAGNOSIS — Z7902 Long term (current) use of antithrombotics/antiplatelets: Secondary | ICD-10-CM | POA: Insufficient documentation

## 2018-04-30 DIAGNOSIS — E7800 Pure hypercholesterolemia, unspecified: Secondary | ICD-10-CM | POA: Insufficient documentation

## 2018-04-30 DIAGNOSIS — Z7982 Long term (current) use of aspirin: Secondary | ICD-10-CM | POA: Insufficient documentation

## 2018-04-30 DIAGNOSIS — G47419 Narcolepsy without cataplexy: Secondary | ICD-10-CM | POA: Diagnosis not present

## 2018-04-30 DIAGNOSIS — I213 ST elevation (STEMI) myocardial infarction of unspecified site: Secondary | ICD-10-CM | POA: Diagnosis present

## 2018-04-30 DIAGNOSIS — Z87891 Personal history of nicotine dependence: Secondary | ICD-10-CM | POA: Insufficient documentation

## 2018-04-30 DIAGNOSIS — Z79899 Other long term (current) drug therapy: Secondary | ICD-10-CM | POA: Insufficient documentation

## 2018-04-30 DIAGNOSIS — I2102 ST elevation (STEMI) myocardial infarction involving left anterior descending coronary artery: Secondary | ICD-10-CM | POA: Insufficient documentation

## 2018-04-30 DIAGNOSIS — I10 Essential (primary) hypertension: Secondary | ICD-10-CM | POA: Diagnosis not present

## 2018-05-03 ENCOUNTER — Encounter (HOSPITAL_COMMUNITY): Payer: Medicare Other

## 2018-05-03 ENCOUNTER — Encounter (HOSPITAL_COMMUNITY)
Admission: RE | Admit: 2018-05-03 | Discharge: 2018-05-03 | Disposition: A | Discharge status: Home / Self Care | Payer: Medicare Other | Source: Ambulatory Visit | Attending: Cardiology | Admitting: Cardiology

## 2018-05-03 DIAGNOSIS — Z8673 Personal history of transient ischemic attack (TIA), and cerebral infarction without residual deficits: Secondary | ICD-10-CM | POA: Diagnosis not present

## 2018-05-03 DIAGNOSIS — Z955 Presence of coronary angioplasty implant and graft: Secondary | ICD-10-CM | POA: Diagnosis not present

## 2018-05-03 DIAGNOSIS — I2510 Atherosclerotic heart disease of native coronary artery without angina pectoris: Secondary | ICD-10-CM | POA: Diagnosis not present

## 2018-05-03 DIAGNOSIS — E7800 Pure hypercholesterolemia, unspecified: Secondary | ICD-10-CM | POA: Diagnosis not present

## 2018-05-03 DIAGNOSIS — I10 Essential (primary) hypertension: Secondary | ICD-10-CM | POA: Diagnosis not present

## 2018-05-03 DIAGNOSIS — I2102 ST elevation (STEMI) myocardial infarction involving left anterior descending coronary artery: Secondary | ICD-10-CM | POA: Diagnosis not present

## 2018-05-05 ENCOUNTER — Encounter (HOSPITAL_COMMUNITY): Payer: Self-pay

## 2018-05-05 ENCOUNTER — Encounter (HOSPITAL_COMMUNITY): Payer: Medicare Other

## 2018-05-05 ENCOUNTER — Encounter (HOSPITAL_COMMUNITY)
Admission: RE | Admit: 2018-05-05 | Discharge: 2018-05-05 | Disposition: A | Discharge status: Home / Self Care | Payer: Medicare Other | Source: Ambulatory Visit | Attending: Cardiology | Admitting: Cardiology

## 2018-05-05 VITALS — BP 132/78 | HR 64 | Ht 65.0 in | Wt 180.1 lb

## 2018-05-05 DIAGNOSIS — Z8673 Personal history of transient ischemic attack (TIA), and cerebral infarction without residual deficits: Secondary | ICD-10-CM | POA: Diagnosis not present

## 2018-05-05 DIAGNOSIS — I2510 Atherosclerotic heart disease of native coronary artery without angina pectoris: Secondary | ICD-10-CM | POA: Diagnosis not present

## 2018-05-05 DIAGNOSIS — E7800 Pure hypercholesterolemia, unspecified: Secondary | ICD-10-CM | POA: Diagnosis not present

## 2018-05-05 DIAGNOSIS — I10 Essential (primary) hypertension: Secondary | ICD-10-CM | POA: Diagnosis not present

## 2018-05-05 DIAGNOSIS — I2102 ST elevation (STEMI) myocardial infarction involving left anterior descending coronary artery: Secondary | ICD-10-CM

## 2018-05-05 DIAGNOSIS — Z955 Presence of coronary angioplasty implant and graft: Secondary | ICD-10-CM | POA: Diagnosis not present

## 2018-05-07 ENCOUNTER — Encounter (HOSPITAL_COMMUNITY): Payer: Medicare Other

## 2018-05-10 ENCOUNTER — Encounter (HOSPITAL_COMMUNITY): Payer: Medicare Other

## 2018-05-12 ENCOUNTER — Encounter (HOSPITAL_COMMUNITY): Payer: Medicare Other

## 2018-05-25 NOTE — Addendum Note (Signed)
Encounter addended by: Sol Passer on: 05/25/2018 2:56 PM  Actions taken: Flowsheet data copied forward, Visit Navigator Flowsheet section accepted

## 2018-06-01 ENCOUNTER — Encounter: Payer: Self-pay | Admitting: Cardiology

## 2018-06-01 ENCOUNTER — Ambulatory Visit (INDEPENDENT_AMBULATORY_CARE_PROVIDER_SITE_OTHER): Payer: Medicare Other | Admitting: Cardiology

## 2018-06-01 VITALS — BP 151/61 | HR 58 | Ht 66.0 in | Wt 181.8 lb

## 2018-06-01 DIAGNOSIS — I10 Essential (primary) hypertension: Secondary | ICD-10-CM | POA: Diagnosis not present

## 2018-06-01 DIAGNOSIS — I480 Paroxysmal atrial fibrillation: Secondary | ICD-10-CM

## 2018-06-01 DIAGNOSIS — Z955 Presence of coronary angioplasty implant and graft: Secondary | ICD-10-CM | POA: Diagnosis not present

## 2018-06-01 DIAGNOSIS — Z01810 Encounter for preprocedural cardiovascular examination: Secondary | ICD-10-CM | POA: Insufficient documentation

## 2018-06-01 DIAGNOSIS — E785 Hyperlipidemia, unspecified: Secondary | ICD-10-CM

## 2018-06-01 DIAGNOSIS — I25119 Atherosclerotic heart disease of native coronary artery with unspecified angina pectoris: Secondary | ICD-10-CM | POA: Insufficient documentation

## 2018-06-01 DIAGNOSIS — I2510 Atherosclerotic heart disease of native coronary artery without angina pectoris: Secondary | ICD-10-CM | POA: Insufficient documentation

## 2018-06-01 DIAGNOSIS — I255 Ischemic cardiomyopathy: Secondary | ICD-10-CM | POA: Diagnosis not present

## 2018-06-01 NOTE — Assessment & Plan Note (Signed)
He had had some hypotension peri-MI.  Was not restarted on ACE inhibitor.  I think we could probably we can consider starting ACE inhibitor or ARB, but would defer to his nephrologist because of CKD. Not on beta-blocker because of bradycardia

## 2018-06-01 NOTE — Assessment & Plan Note (Signed)
Remains on moderate dose atorvastatin.   Labs are well controlled as of August.  For now continue current dose of atorvastatin as he is not having any myalgias.

## 2018-06-01 NOTE — Assessment & Plan Note (Signed)
Not having any active angina's symptoms.  He has a chronically occluded RCA filling via collaterals from the LAD that has now had a stent along with a stented circumflex.  Diffuse moderate disease elsewhere.  Plan: Continue statin as well as aspirin Plavix per discussion.  Unable to start beta-blocker because of bradycardia (preferably using amiodarone for rhythm control).  If his blood pressures continue to be elevated, would defer to nephrologist, but may consider ARB versus amlodipine.

## 2018-06-01 NOTE — Assessment & Plan Note (Signed)
Mildly reduced EF by echo with inferior hypokinesis probably related to the occluded RCA.  With no active heart failure symptoms, I would not treat aggressively.  He is not having any heart failure symptoms.  Is euvolemic on exam.  Prefer to avoid diuretic.  (Would prefer using his support stockings for her is mild edema).  If pressures become higher, would potentially consider ACE inhibitor-ARB.

## 2018-06-01 NOTE — Assessment & Plan Note (Signed)
Preop evaluation for dental implant surgery which is low risk.  With no active anginal symptoms and doing well following his MI, would not do any further evaluation at this time.  EF is reduced, but not significantly reduced and no heart failure symptoms.  Would preferably not stop Plavix until 1 year out, but at 6 months out now would be okay to temporarily hold 5 days preop.  Per the patient, he would be okay to continue aspirin, and if that is the case then I would prefer to do that.  Restart Plavix 1 day postop with 4 tabs and then once daily.

## 2018-06-01 NOTE — Assessment & Plan Note (Addendum)
As far I can tell, he has not had any further breakthrough episodes of A. fib since his MI which would suggest that it is possibly peri-MI related.   He is still on amiodarone for rhythm control -> not able to use beta-blocker as well because of bradycardia.  There is been a question of anticoagulation.  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  I would like to continue aspirin plus Plavix for 12 months and then would probably convert to aspirin plus Xarelto.

## 2018-06-01 NOTE — Patient Instructions (Addendum)
Medication Instructions:  SEE CLEARANCE LETTER FOR DETAILS ON MEDICATION TO STOP FOR  DENTAL MPLANT If you need a refill on your cardiac medications before your next appointment, please call your pharmacy.   Lab work: NOT NEEDED If you have labs (blood work) drawn today and your tests are completely normal, you will receive your results only by: Marland Kitchen MyChart Message (if you have MyChart) OR . A paper copy in the mail If you have any lab test that is abnormal or we need to change your treatment, we will call you to review the results.  Testing/Procedures: NOT NEEDED  Follow-Up: At [facility], you and your health needs are our priority.  As part of our continuing mission to provide you with exceptional heart care, we have created designated Provider Care Teams.  These Care Teams include your primary Cardiologist (physician) and Advanced Practice Providers (APPs -  Physician Assistants and Nurse Practitioners) who all work together to provide you with the care you need, when you need it. You will need a follow up appointment in 7 months May or June 2020.  Please call our office 2 months in advance to schedule this appointment.  You may see Glenetta Hew, MD or one of the following Advanced Practice Providers on your designated Care Team:   Rosaria Ferries, PA-C . Jory Sims, DNP, ANP  Any Other Special Instructions Will Be Listed Below (If Applicable).

## 2018-06-01 NOTE — Progress Notes (Signed)
PCP: Lajean Manes, MD  OMFS: Dr. Keane Police -[facility] Note: Chief Complaint  Patient presents with  . Follow-up    66-month  . Coronary Artery Disease    No angina  . Pre-op Exam    Clearance to hold Plavix for dental procedure    HPI: Paul Kramer is a 84 y.o. male with a PMH notable for recent inferolateral STEMI with ischemic cardiomyopathy complicated by A. fib (peri-MI) who presents today for 63-month follow-up. -->  Notably to discuss upcoming dental implant procedure and need to hold antiplatelet agent.  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   His son-in-law, [name], is an orthopedic PA with [employer].  He does have chronic renal insufficiency followed by Dr. Felipa Eth.  Baseline creatinine is somewhere between 1.6-1.8.  Cardiac history:   November 29, 2017: Inferolateral ST elevations in new onset A. fib.  --> Converted to NSR on [place].  Cardiac cath: MV CAD however they decided he did not want to undergo CABG. (RCA was CTO)  --> complicated by large forearm hematoma post Cath & CIN.  June 10 - staged PCI LAD & Cx.  Recent Hospitalizations:    None since June STEMI  Studies Personally Reviewed - (if available, images/films reviewed: From Epic Chart or Care Everywhere)  None  Paul Kramer was last seen on January 22, 2018 --overall doing well.  Occasionally episodes of dull chest discomfort but not necessarily exertional.  Was doing well with activity and walking 0.6 miles 2 times a day.  No heart failure symptoms of PND orthopnea or edema.  No irregular heartbeats to suggest recurrence of A. fib.  No bleeding issues.  Discussed potential timing of converting to DOAC versus continuing aspirin Plavix and also potential timing of possible holding anticoagulation/antiplatelet agents for dental procedure.  Interval History: Paul Kramer presents here today overall doing very well.  He has not been getting them  out of exercise that he used to get because is getting cold and he has not been wanting to walk outside.  But he does now is go to [place] or the grocery store to walk.  He still is not noticing any exertional chest tightness or pressure.  He still says off and on he will get this pressure sensation on both sides of his chest that, comes from his back --not like his anginal symptoms however.  He denies any PND, or orthopnea but does have mild edema that is pretty stable. No sensation of rapid irregular heartbeats or palpitations.  No lightheadedness or dizziness or wooziness.  No syncope/near syncope or TIA/amaurosis fugax.  Besides some bruising with aspirin and Plavix, no bleeding issues.  No melena, hematochezia, hematuria or epistaxis.  No claudication.  Blood pressure at home usually runs in the 130s-140s /60s to 70s.  ROS: A comprehensive was performed. Review of Systems  Constitutional: Negative for malaise/fatigue (slowly getting energy back) and weight loss.  HENT: Negative for congestion and nosebleeds.   Respiratory: Negative for cough, shortness of breath and wheezing.   Cardiovascular: Negative for palpitations.  Gastrointestinal: Negative for abdominal pain, blood in stool, constipation, heartburn and melena.  Genitourinary: Positive for frequency (nocturia). Negative for hematuria.       Still having voiding issues - followed by Urology  Musculoskeletal: Positive for back pain. Negative for falls and joint pain.       A little unsteady gait   Neurological: Positive for dizziness (  positional).  Psychiatric/Behavioral: Negative for memory loss. The patient does not have insomnia (nocturia).   All other systems reviewed and are negative.  I have reviewed and (if needed) personally updated the patient's problem list, medications, allergies, past medical and surgical history, social and family history.   Past Medical History:  Diagnosis Date  . AAA (abdominal aortic aneurysm)  ([place])   . Coronary artery disease   . Hypercholesteremia   . Hypertension   . Myocardial infarction of inferolateral wall ([place]) 11/2017   ? prior Infarct; - Cath 11/29/17 - 3 V CAD (CTO RCA), 95% m-d Cx (@ OM2 55%)-> PCI Cx-PTCA Om2; 85% p-m LAD => DES PCI   . Narcolepsy    pt may pass out when laughing  . Spinal stenosis   . TIA (transient ischemic attack)     Past Surgical History:  Procedure Laterality Date  . CARDIAC CATHETERIZATION    . CORONARY BALLOON ANGIOPLASTY N/A 11/29/2017   Procedure: CORONARY BALLOON ANGIOPLASTY;  Surgeon: Leonie Man, MD;  Location: [facility];  Service: Cardiovascular;; PTCA of ostOM2 55% -> 20%. (jailed)  . CORONARY STENT INTERVENTION N/A 12/07/2017   Procedure: CORONARY STENT INTERVENTION;  Surgeon: Leonie Man, MD;  Location: [facility];  Service: Cardiovascular;; m-dCx - Synergy DES 3 x 16 (3.3 mm), PTCA ostOM2 (-20%).  pLAD 95% - Synergy DES 3 x 16 (3.6 mm)  . LEFT HEART CATH N/A 12/07/2017   Procedure: Left Heart Cath;  Surgeon: Leonie Man, MD;  Location: [facility] [facility];  Service: Cardiovascular;  Laterality: N/A;  . LEFT HEART CATH AND CORONARY ANGIOGRAPHY N/A 11/29/2017   Procedure: LEFT HEART CATH AND CORONARY ANGIOGRAPHY;  Surgeon: Leonie Man, MD;  Location: [facility];; mRCA CTO, p-m LAD 85%, m-dCx 95% @ OM2 55%. Mod High LVEDP   . LIPOMA EXCISION    . SPINAL FIXATION SURGERY    . TRANSTHORACIC ECHOCARDIOGRAM  11/30/2017   EF 45-50%.  Inferior hypokinesis.  Elevated filling pressures.  Mild to moderate MR.     Cardiac Cath 11/29/2017: 3V CAD - 100% CTO mRCA (RPDA fills via collaterals - RPAV 95%). LM ~40%. Mid-dCx 95% (55% ost OM2) - Culprit. Ost-pLAD 85%.  PCI Cx & LAD - 12/07/2017  m-dCx - Synergy DES 3 x 16 (3.3 mm), PTCA ostOM2 (-20%).    pLAD 95% - Synergy DES 3 x 16 (3.6 mm) Diagnostic Diagram     Post-Intervention Diagram             Current Meds  Medication Sig  . acetaminophen  (TYLENOL) 325 MG tablet Take 325 mg by mouth every 6 (six) hours as needed for mild pain.  Marland Kitchen allopurinol (ZYLOPRIM) 100 MG tablet Take 100 mg by mouth every morning.  Marland Kitchen amiodarone (PACERONE) 100 MG tablet Take 1 tablet (100 mg total) by mouth daily.  Marland Kitchen aspirin EC 81 MG tablet Take 81 mg by mouth daily.  Marland Kitchen atorvastatin (LIPITOR) 40 MG tablet Take 1 tablet (40 mg total) by mouth daily at 6 PM.  . clopidogrel (PLAVIX) 75 MG tablet Take 1 tablet (75 mg total) by mouth daily.  . cyanocobalamin 1000 MCG tablet Take 1 tablet (1,000 mcg total) by mouth daily.  . famotidine (PEPCID) 20 MG tablet Take 20 mg by mouth 2 (two) times daily.  . Multiple Vitamins-Minerals (CENTRUM SILVER PO) Take 1 tablet by mouth daily.  . nitroGLYCERIN (NITROSTAT) 0.4 MG SL tablet Place 1 tablet (0.4 mg  total) under the tongue every 5 (five) minutes x 3 doses as needed for chest pain.  Marland Kitchen Psyllium (METAMUCIL FIBER PO) Take by mouth.  . terazosin (HYTRIN) 2 MG capsule Take 4 mg by mouth every evening.  Marland Kitchen VITAMIN D, ERGOCALCIFEROL, PO Take 5,000 Units by mouth daily.     Allergies  Allergen Reactions  . Other Shortness Of Breath    Esters  . Contrast Media [Iodinated Diagnostic Agents] Other (See Comments)    Just feel bad all over  . Morphine And Related Other (See Comments)    Patient states he does not like to take Morphine as it gives him nightmares and hallucinations.     Social History   Tobacco Use  . Smoking status: Former Research scientist (life sciences)  . Smokeless tobacco: Never Used  Substance Use Topics  . Alcohol use: Yes    Comment: rare  . Drug use: Never   Social History   Social History Narrative  . Not on file    Family history is unknown by patient.  Wt Readings from Last 3 Encounters:  06/01/18 181 lb 12.8 oz (82.5 kg)  05/05/18 180 lb 1.9 oz (81.7 kg)  02/04/18 179 lb 7.3 oz (81.4 kg)    PHYSICAL EXAM BP (!) 151/61   Pulse (!) 58   Ht 5\' 6"  (1.676 m)   Wt 181 lb 12.8 oz (82.5 kg)   SpO2 99%   BMI  29.34 kg/m  Physical Exam  Constitutional: He is oriented to person, place, and time. He appears well-developed and well-nourished. No distress.  Healthy-appearing.  Well-groomed.  HENT:  Head: Normocephalic and atraumatic.  Mouth/Throat: Oropharynx is clear and moist.  Eyes: EOM are normal.  Neck: Normal range of motion. Neck supple. No hepatojugular reflux and no JVD present. Carotid bruit is not present. No thyromegaly present.  Cardiovascular: Regular rhythm, intact distal pulses and normal pulses.  Occasional extrasystoles are present. Bradycardia present. PMI is not displaced. Exam reveals no gallop.  Murmur heard.  Harsh crescendo-decrescendo early systolic murmur is present with a grade of 1/6 at the upper right sternal border radiating to the neck. Pulmonary/Chest: Effort normal and breath sounds normal. No respiratory distress. He has no wheezes. He has no rales.  Abdominal: Soft. Bowel sounds are normal. He exhibits no distension. There is no tenderness. There is no rebound.  No HJR  Musculoskeletal: Normal range of motion. He exhibits no edema.  Neurological: He is alert and oriented to person, place, and time.  Skin:  Diffuse mild upper extremity ecchymosis/bruising  Psychiatric: He has a normal mood and affect. His behavior is normal. Judgment and thought content normal.  Vitals reviewed.    Adult ECG Report  Rate: 56 ;  Rhythm: sinus bradycardia and 1st Deg AVB.  Otherwise normal axis, intervals & durations;   Narrative Interpretation: '   Other studies Reviewed: Additional studies/ records that were reviewed today include:  Recent Labs:     From [facility] February 25, 2017-TC 105, TG 102, HDL 34, LDL 50.  A1c 5.7.  BUN/ Cr  28/1.88 --> labs checked by Dr. Posey Pronto in October showed Cr 1.7.  Lab Results  Component Value Date   CHOL 137 11/30/2017   HDL 38 (L) 11/30/2017   LDLCALC 80 11/30/2017   TRIG 93 11/30/2017   CHOLHDL 3.6 11/30/2017   Lab Results  Component Value  Date   CREATININE 1.93 (H) 12/12/2017   BUN 28 (H) 12/12/2017   NA 135 12/12/2017   K 4.2  12/12/2017   CL 107 12/12/2017   CO2 20 (L) 12/12/2017     This patients CHA2DS2-VASc Score and unadjusted Ischemic Stroke Rate (% per year) is equal to 11.2 % stroke rate/year from a score of 7  Above score calculated as 1 point each if present [CHF, HTN, DM, Vascular=MI/PAD/Aortic Plaque, Age if 65-74, or Male]; 2 points each if present [Age > 75, or Stroke/TIA/TE]   ASSESSMENT / PLAN: Problem List Items Addressed This Visit    Coronary artery disease involving native coronary artery of native heart with angina pectoris ([place]) - Primary (Chronic)    Not having any active angina's symptoms.  He has a chronically occluded RCA filling via collaterals from the LAD that has now had a stent along with a stented circumflex.  Diffuse moderate disease elsewhere.  Plan: Continue statin as well as aspirin Plavix per discussion.  Unable to start beta-blocker because of bradycardia (preferably using amiodarone for rhythm control).  If his blood pressures continue to be elevated, would defer to nephrologist, but may consider ARB versus amlodipine.      Essential hypertension (Chronic)    He had had some hypotension peri-MI.  Was not restarted on ACE inhibitor.  I think we could probably we can consider starting ACE inhibitor or ARB, but would defer to his nephrologist because of CKD. Not on beta-blocker because of bradycardia      Hyperlipidemia with target low density lipoprotein (LDL) cholesterol less than 70 mg/dL (Chronic)    Remains on moderate dose atorvastatin.   Labs are well controlled as of August.  For now continue current dose of atorvastatin as he is not having any myalgias.       Ischemic cardiomyopathy (Chronic)    Mildly reduced EF by echo with inferior hypokinesis probably related to the occluded RCA.  With no active heart failure symptoms, I would not treat aggressively.  He is not  having any heart failure symptoms.  Is euvolemic on exam.  Prefer to avoid diuretic.  (Would prefer using his support stockings for her is mild edema).  If pressures become higher, would potentially consider ACE inhibitor-ARB.      PAF (paroxysmal atrial fibrillation) ([facility]); CHA2DS2-VASc Score -7 (Chronic)    As far I can tell, he has not had any further breakthrough episodes of A. fib since his MI which would suggest that it is possibly peri-MI related.   He is still on amiodarone for rhythm control -> not able to use beta-blocker as well because of bradycardia.  There is been a question of anticoagulation.  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  I would like to continue aspirin plus Plavix for 12 months and then would probably convert to aspirin plus Xarelto.      Preoperative cardiovascular examination    Preop evaluation for dental implant surgery which is low risk.  With no active anginal symptoms and doing well following his MI, would not do any further evaluation at this time.  EF is reduced, but not significantly reduced and no heart failure symptoms.  Would preferably not stop Plavix until 1 year out, but at 6 months out now would be okay to temporarily hold 5 days preop.  Per the patient, he would be okay to continue aspirin, and if that is the case then I would prefer to do that.  Restart Plavix 1 day postop with 4 tabs and then once daily.  Status post coronary artery stent placement (Chronic)      I spent a total of 40 minutes with the patient and chart review. >  50% of the time was spent in direct patient consultation.  Multiple issues to discuss including preoperative evaluation and decision making a reference antiplatelet agent.  Current medicines are reviewed at length with the patient today.  (+/- concerns) n/a The following changes have been made:  see below.  Patient Instructions  Medication  Instructions:  SEE CLEARANCE LETTER FOR DETAILS ON MEDICATION TO STOP FOR  DENTAL MPLANT If you need a refill on your cardiac medications before your next appointment, please call your pharmacy.   Lab work: NOT NEEDED If you have labs (blood work) drawn today and your tests are completely normal, you will receive your results only by: Marland Kitchen MyChart Message (if you have MyChart) OR . A paper copy in the mail If you have any lab test that is abnormal or we need to change your treatment, we will call you to review the results.  Testing/Procedures: NOT NEEDED  Follow-Up: At [facility], you and your health needs are our priority.  As part of our continuing mission to provide you with exceptional heart care, we have created designated Provider Care Teams.  These Care Teams include your primary Cardiologist (physician) and Advanced Practice Providers (APPs -  Physician Assistants and Nurse Practitioners) who all work together to provide you with the care you need, when you need it. You will need a follow up appointment in 7 months May or June 2020.  Please call our office 2 months in advance to schedule this appointment.  You may see Glenetta Hew, MD or one of the following Advanced Practice Providers on your designated Care Team:   Rosaria Ferries, PA-C . Jory Sims, DNP, ANP  Any Other Special Instructions Will Be Listed Below (If Applicable).     Studies Ordered:   No orders of the defined types were placed in this encounter.     Glenetta Hew, M.D., M.S. Interventional Cardiologist   Pager # [phone] Phone # [phone] [address]. [place], [place] [address]   Thank you for choosing [facility] at [facility]!!

## 2018-06-03 NOTE — Addendum Note (Signed)
Encounter addended by: Magda Kiel, RN on: 06/03/2018 3:32 PM  Actions taken: Visit Navigator Flowsheet section accepted, Sign clinical note

## 2018-06-03 NOTE — Progress Notes (Signed)
Discharge Progress Report  Patient Details  Name: Paul Kramer MRN: [id number] Date of Birth: 12-03-1929 Referring Provider:     [facility] from 02/04/2018 in [place]  Referring Provider  Leonie Man MD        Number of Visits: 36  Reason for Discharge:  Patient reached a stable level of exercise. Patient independent in their exercise. Patient has met program and personal goals.  Smoking History:  Social History   Tobacco Use  Smoking Status Former Smoker  Smokeless Tobacco Never Used    Diagnosis:  ST elevation myocardial infarction involving left anterior descending (LAD) coronary artery (HCC)  Status post coronary artery stent placement  ADL [facility]:   Initial Exercise Prescription: Initial Exercise Prescription - 02/04/18 1200      Date of Initial Exercise RX and Referring Provider   Date  02/04/18    Referring Provider  Leonie Man MD     Expected Discharge Date  04/30/18      NuStep   Level  2    SPM  75    Minutes  15    METs  1.8      Track   Laps  12    Minutes  10    METs  2.39      Prescription Details   Frequency (times per week)  3x    Duration  Progress to 30 minutes of continuous aerobic without signs/symptoms of physical distress      Intensity   THRR 40-80% of Max Heartrate  54-108    Ratings of Perceived Exertion  11-13    Perceived Dyspnea  0-4      Progression   Progression  Continue progressive overload as per policy without signs/symptoms or physical distress.      Resistance Training   Training Prescription  Yes    Weight  3lbs    Reps  10-15       Discharge Exercise Prescription (Final Exercise Prescription Changes): Exercise Prescription Changes - 05/05/18 0956      Response to Exercise   Blood Pressure (Admit)  132/78    Blood Pressure (Exercise)  114/60    Blood Pressure (Exit)  112/60    Heart Rate (Admit)  64 bpm    Heart Rate (Exercise)  74  bpm    Heart Rate (Exit)  54 bpm    Rating of Perceived Exertion (Exercise)  11    Symptoms  none    Duration  Progress to 30 minutes of  aerobic without signs/symptoms of physical distress    Intensity  THRR unchanged      Progression   Progression  Continue to progress workloads to maintain intensity without signs/symptoms of physical distress.    Average METs  3.5      Resistance Training   Training Prescription  No   Relaxation day, no weights.   Weight  --    Reps  --    Time  --      Interval Training   Interval Training  No      NuStep   Level  5    SPM  85    Minutes  15    METs  3.5      Arm Ergometer   Level  1    Minutes  15      Home Exercise Plan   Plans to continue exercise at  Home (comment)    Frequency  Add 4 additional days to program exercise sessions.    Initial Home Exercises Provided  02/26/18       Functional Capacity: 6 Minute Walk    Row Name 02/04/18 1212 04/28/18 0957       6 Minute Walk   Phase  Initial  Discharge    Distance  1459 feet  1600 feet    Distance % Change  -  9.66 %    Distance Feet Change  -  141 ft    Walk Time  6 minutes  6 minutes    # of Rest Breaks  0  0    MPH  2.76  -    METS  1.86  -    RPE  9  9    Perceived Dyspnea   0  0    VO2 Peak  6.49  -    Symptoms  No  No    Resting HR  56 bpm  69 bpm    Resting BP  122/60  136/76    Resting Oxygen Saturation   97 %  -    Exercise Oxygen Saturation  during 6 min walk  98 %  -    Max Ex. HR  62 bpm  98 bpm    Max Ex. BP  138/80  148/58    2 Minute Post BP  138/78  122/74       Psychological, QOL, Others - Outcomes: PHQ 2/9: Depression screen [facility] 2/9 05/05/2018 02/08/2018  Decreased Interest 0 0  Down, Depressed, Hopeless 0 0  PHQ - 2 Score 0 0    Quality of Life: Quality of Life - 05/25/18 1430      Quality of Life   Select  Quality of Life      Quality of Life Scores   Health/Function Pre  27.63 %    Health/Function Post  30 %    Health/Function  % Change  8.58 %    Socioeconomic Pre  30 %    Socioeconomic Post  30 %    Socioeconomic % Change   0 %    Psych/Spiritual Pre  30 %    Psych/Spiritual Post  30 %    Psych/Spiritual % Change  0 %    Family Pre  30 %    Family Post  30 %    Family % Change  0 %    GLOBAL Pre  28.89 %    GLOBAL Post  30 %    GLOBAL % Change  3.84 %       Personal Goals: Goals established at orientation with interventions provided to work toward goal. Personal Goals and Risk Factors at Admission - 02/04/18 1228      Core Components/Risk Factors/Patient Goals on Admission    Weight Management  Yes    Intervention  Weight Management: Develop a combined nutrition and exercise program designed to reach desired caloric intake, while maintaining appropriate intake of nutrient and fiber, sodium and fats, and appropriate energy expenditure required for the weight goal.;Weight Management: Provide education and appropriate resources to help participant work on and attain dietary goals.;Weight Management/Obesity: Establish reasonable short term and long term weight goals.    Admit Weight  179 lb 7.3 oz (81.4 kg)    Goal Weight: Short Term  175 lb (79.4 kg)    Goal Weight: Long Term  169 lb (76.7 kg)    Expected Outcomes  Short Term: Continue to assess and modify interventions  until short term weight is achieved;Long Term: Adherence to nutrition and physical activity/exercise program aimed toward attainment of established weight goal;Weight Loss: Understanding of general recommendations for a balanced deficit meal plan, which promotes 1-2 lb weight loss per week and includes a negative energy balance of [phone] kcal/d;Understanding recommendations for meals to include 15-35% energy as protein, 25-35% energy from fat, 35-60% energy from carbohydrates, less than 258m of dietary cholesterol, 20-35 gm of total fiber daily;Understanding of distribution of calorie intake throughout the day with the consumption of 4-5  meals/snacks    Hypertension  Yes    Intervention  Provide education on lifestyle modifcations including regular physical activity/exercise, weight management, moderate sodium restriction and increased consumption of fresh fruit, vegetables, and low fat dairy, alcohol moderation, and smoking cessation.;Monitor prescription use compliance.    Expected Outcomes  Short Term: Continued assessment and intervention until BP is < 140/99mHG in hypertensive participants. < 130/8024mG in hypertensive participants with diabetes, heart failure or chronic kidney disease.;Long Term: Maintenance of blood pressure at goal levels.    Lipids  Yes    Intervention  Provide education and support for participant on nutrition & aerobic/resistive exercise along with prescribed medications to achieve LDL <50m33mDL >40mg72m Expected Outcomes  Short Term: Participant states understanding of desired cholesterol values and is compliant with medications prescribed. Participant is following exercise prescription and nutrition guidelines.;Long Term: Cholesterol controlled with medications as prescribed, with individualized exercise RX and with personalized nutrition plan. Value goals: LDL < 50mg,21m > 40 mg.        Personal Goals Discharge: Goals and Risk Factor Review    Row Name 03/04/18 1418 04/01/18 1137 04/29/18 1130 05/05/18 0959       Core Components/Risk Factors/Patient Goals Review   Personal Goals Review  Hypertension;Lipids  Hypertension;Lipids  Hypertension;Lipids  Hypertension;Lipids    Review  Paul Kramer haMikki Santeeeen doing well with exercise at cardiac rehab. Paul Kramer's vital signs have been stable. Paul Kramer haMikki Santeeaintained his current weight.  Paul Kramer haMikki Santeeeen doing well with exercise at cardiac rehab. Paul Kramer's vital signs have been stable. Paul Kramer haMikki Santeeaintained his current weight.  Paul Kramer haMikki Santeeeen doing well with exercise at cardiac rehab. Paul Kramer's vital signs have been stable. Paul Kramer haMikki Santeeaintained his current weight.  Paul Kramer haMikki Santeeompleted the CR  Program with 36 completed sessions.  He feels that his heart is stronger.     Expected Outcomes  Patient will continue to participate in exercise, nutrition and lifestyle modification opportunities  Patient will continue to participate in exercise, nutrition and lifestyle modification opportunities  Patient will continue to participate in exercise, nutrition and lifestyle modification opportunities  Paul Kramer wiMikki Santeecontinue to participate in exercise, nutrition and lifestyle modification opportunities.  Paul Kramer plMikki Kramer to continue swimming at a local [employer]   Exercise Goals and Review: Exercise Goals    Row Name 02/04/18 1214             Exercise Goals   Increase Physical Activity  Yes       Intervention  Provide advice, education, support and counseling about physical activity/exercise needs.;Develop an individualized exercise prescription for aerobic and resistive training based on initial evaluation findings, risk stratification, comorbidities and participant's personal goals.       Expected Outcomes  Short Term: Attend rehab on a regular basis to increase amount of physical activity.;Long Term: Add in home exercise to make exercise part of routine and to increase amount of physical activity.;Long  Term: Exercising regularly at least 3-5 days a week.       Increase Strength and Stamina  Yes       Intervention  Provide advice, education, support and counseling about physical activity/exercise needs.;Develop an individualized exercise prescription for aerobic and resistive training based on initial evaluation findings, risk stratification, comorbidities and participant's personal goals.       Expected Outcomes  Short Term: Perform resistance training exercises routinely during rehab and add in resistance training at home;Short Term: Increase workloads from initial exercise prescription for resistance, speed, and METs.;Long Term: Improve cardiorespiratory fitness, muscular endurance and strength as measured  by increased METs and functional capacity (6MWT)       Able to understand and use rate of perceived exertion (RPE) scale  Yes       Intervention  Provide education and explanation on how to use RPE scale       Expected Outcomes  Short Term: Able to use RPE daily in rehab to express subjective intensity level;Long Term:  Able to use RPE to guide intensity level when exercising independently       Knowledge and understanding of Target Heart Rate Range (THRR)  Yes       Intervention  Provide education and explanation of THRR including how the numbers were predicted and where they are located for reference       Expected Outcomes  Short Term: Able to state/look up THRR;Short Term: Able to use daily as guideline for intensity in rehab;Long Term: Able to use THRR to govern intensity when exercising independently       Able to check pulse independently  Yes       Intervention  Provide education and demonstration on how to check pulse in carotid and radial arteries.;Review the importance of being able to check your own pulse for safety during independent exercise       Expected Outcomes  Short Term: Able to explain why pulse checking is important during independent exercise;Long Term: Able to check pulse independently and accurately       Understanding of Exercise Prescription  Yes       Intervention  Provide education, explanation, and written materials on patient's individual exercise prescription       Expected Outcomes  Short Term: Able to explain program exercise prescription;Long Term: Able to explain home exercise prescription to exercise independently          Exercise Goals Re-Evaluation: Exercise Goals Re-Evaluation    Row Name 02/08/18 1047 02/22/18 1030 03/24/18 1000 04/21/18 1021 05/25/18 1433     Exercise Goal Re-Evaluation   Exercise Goals Review  Able to understand and use rate of perceived exertion (RPE) scale  Increase Physical Activity  Increase Physical Activity  Increase Physical  Activity;Able to understand and use rate of perceived exertion (RPE) scale;Understanding of Exercise Prescription;Increase Strength and Stamina  Increase Physical Activity;Able to understand and use rate of perceived exertion (RPE) scale;Understanding of Exercise Prescription;Increase Strength and Stamina   Comments  Patient able to understand and use RPE scale sppropriately.  Patient states that he's walking 0.6 miles twice/day, 4 days/ week in addition to exercise at cardiac rehab.   In addition to walking 0.6 of a mile BID, pt is now swimming 10 laps on Tuesdays, Wednesdays, and Thursdays.  Patient continues to walk daily and swim 12 laps in 20 minutes 3 days/week.  Patient completed the phase 2 cardiac rehab program and did well increasing functional capacity 10% as measured by the  6MWT. Pt will continue exercise independently walking and/or swimming 3 days/week.   Expected Outcomes  Increase workloads as tolerated.  Continue to progress workloads to help improve strength and stamina.  Patient will continue walking and swimming routine to help improve cardiorespiratory fitness.  Patient will continue current exercise routine to help achieve personal health and fitness goals.  Patient will exercise 20-30 minutes, 3 days/week to help maintain health and fitness gains.      Nutrition & Weight - Outcomes: Pre Biometrics - 02/04/18 1214      Pre Biometrics   Height  _0  (1.651 m)    Weight  81.4 kg    Waist Circumference  41 inches    Hip Circumference  42 inches    Waist to Hip Ratio  0.98 %    BMI (Calculated)  29.86    Triceps Skinfold  26 mm    % Body Fat  32.1 %    Grip Strength  33 kg    Flexibility  12.5 in    Single Leg Stand  0 seconds      Post Biometrics - 05/05/18 1047       Post  Biometrics   Height  _1  (1.651 m)    Weight  81.7 kg    Waist Circumference  39.5 inches    Hip Circumference  41.75 inches    Waist to Hip Ratio  0.95 %    BMI (Calculated)  29.97    Triceps  Skinfold  14 mm    % Body Fat  28.8 %    Grip Strength  33 kg    Flexibility  12.5 in    Single Leg Stand  0 seconds       Nutrition: Nutrition Therapy & Goals - 02/04/18 1035      Nutrition Therapy   Diet  consistent carbohydrate heart healthy      Personal Nutrition Goals   Nutrition Goal  Pt to identify and limit food sources of saturated fat, trans fat, sodium, refined carbohydrates    Personal Goal #2  Pt to identify food quantities necessary to achieve weight loss of 6-24 lbs. at graduation from cardiac rehab.    Personal Goal #3  Pt able to name foods that affect blood glucose.      Intervention Plan   Intervention  Prescribe, educate and counsel regarding individualized specific dietary modifications aiming towards targeted core components such as weight, hypertension, lipid management, diabetes, heart failure and other comorbidities.    Expected Outcomes  Short Term Goal: Understand basic principles of dietary content, such as calories, fat, sodium, cholesterol and nutrients.       Nutrition Discharge: Nutrition Assessments - 05/10/18 0820      MEDFICTS Scores   Pre Score  12    Post Score  9    Score Difference  -3       Education Questionnaire Score: Knowledge Questionnaire Score - 05/25/18 1429      Knowledge Questionnaire Score   Pre Score  20/24    Post Score  23/24       Goals reviewed with patient; copy given to patient.Paul Kramer graduated from cardiac rehab program on 05/05/18 with completion of 36 exercise sessions in Phase II. Pt maintained good attendance and progressed nicely during his participation in rehab as evidenced by increased MET level.   Medication list reconciled. Repeat  PHQ score-  0.  Pt has made significant lifestyle changes and should be commended for his  success. Pt feels he has achieved his goals during cardiac rehab.   Pt plans to continue exercise by swimming at the [facility] and walking in his neighborhood. Paul Kramer increased his distance on his  post exercise walk test and maintained his current weight. We are proud of Paul Kramer's progress and enjoyed him participating in the program .Barnet Pall, RN,BSN 06/03/2018 3:29 PM

## 2018-07-06 DIAGNOSIS — Z0000 Encounter for general adult medical examination without abnormal findings: Secondary | ICD-10-CM | POA: Diagnosis not present

## 2018-07-06 DIAGNOSIS — I480 Paroxysmal atrial fibrillation: Secondary | ICD-10-CM | POA: Diagnosis not present

## 2018-07-06 DIAGNOSIS — M546 Pain in thoracic spine: Secondary | ICD-10-CM | POA: Diagnosis not present

## 2018-07-06 DIAGNOSIS — I129 Hypertensive chronic kidney disease with stage 1 through stage 4 chronic kidney disease, or unspecified chronic kidney disease: Secondary | ICD-10-CM | POA: Diagnosis not present

## 2018-07-06 DIAGNOSIS — N183 Chronic kidney disease, stage 3 (moderate): Secondary | ICD-10-CM | POA: Diagnosis not present

## 2018-07-06 DIAGNOSIS — I270 Primary pulmonary hypertension: Secondary | ICD-10-CM | POA: Diagnosis not present

## 2018-07-06 DIAGNOSIS — G4700 Insomnia, unspecified: Secondary | ICD-10-CM | POA: Diagnosis not present

## 2018-07-06 DIAGNOSIS — I700 Atherosclerosis of aorta: Secondary | ICD-10-CM | POA: Diagnosis not present

## 2018-08-16 DIAGNOSIS — N183 Chronic kidney disease, stage 3 (moderate): Secondary | ICD-10-CM | POA: Diagnosis not present

## 2018-08-24 DIAGNOSIS — D631 Anemia in chronic kidney disease: Secondary | ICD-10-CM | POA: Diagnosis not present

## 2018-08-24 DIAGNOSIS — I129 Hypertensive chronic kidney disease with stage 1 through stage 4 chronic kidney disease, or unspecified chronic kidney disease: Secondary | ICD-10-CM | POA: Diagnosis not present

## 2018-08-24 DIAGNOSIS — N183 Chronic kidney disease, stage 3 (moderate): Secondary | ICD-10-CM | POA: Diagnosis not present

## 2018-08-24 DIAGNOSIS — N2581 Secondary hyperparathyroidism of renal origin: Secondary | ICD-10-CM | POA: Diagnosis not present

## 2018-08-25 ENCOUNTER — Telehealth: Payer: Self-pay | Admitting: Cardiology

## 2018-08-25 NOTE — Telephone Encounter (Signed)
Records received from [facility] on 08/25/18, Appt 11/29/18 @ 1:20PM. [place]

## 2018-09-06 DIAGNOSIS — I700 Atherosclerosis of aorta: Secondary | ICD-10-CM | POA: Diagnosis not present

## 2018-09-06 DIAGNOSIS — I129 Hypertensive chronic kidney disease with stage 1 through stage 4 chronic kidney disease, or unspecified chronic kidney disease: Secondary | ICD-10-CM | POA: Diagnosis not present

## 2018-09-06 DIAGNOSIS — N183 Chronic kidney disease, stage 3 (moderate): Secondary | ICD-10-CM | POA: Diagnosis not present

## 2018-09-06 DIAGNOSIS — E7800 Pure hypercholesterolemia, unspecified: Secondary | ICD-10-CM | POA: Diagnosis not present

## 2018-09-06 DIAGNOSIS — R600 Localized edema: Secondary | ICD-10-CM | POA: Diagnosis not present

## 2018-09-06 DIAGNOSIS — I480 Paroxysmal atrial fibrillation: Secondary | ICD-10-CM | POA: Diagnosis not present

## 2018-11-24 ENCOUNTER — Telehealth: Payer: Self-pay | Admitting: Cardiology

## 2018-11-24 NOTE — Telephone Encounter (Signed)
Phone visit, call daughter Vinnie Level @ [phone]/SH chart/pre reg complete/consent obtained -- ttf

## 2018-11-24 NOTE — Telephone Encounter (Signed)
   TELEPHONE CALL NOTE Spoke to patient 's daughter This patient has been deemed a candidate for follow-up tele-health visit to limit community exposure during the Covid-19 pandemic. I spoke with the patient via phone to discuss instructions. The patient was advised to review the section on consent for treatment as well. The patient will receive a phone call 2-3 days prior to their E-Visit at which time consent will be verbally confirmed.   A Virtual Office Visit appointment type has been scheduled for 11/29/18 with DR Ellyn Hack, with "VIDEO/TEXT   I have  confirmed the patient is active in [place].    Raiford Simmonds, RN 11/24/2018 3:15 PM

## 2018-11-26 DIAGNOSIS — N183 Chronic kidney disease, stage 3 (moderate): Secondary | ICD-10-CM | POA: Diagnosis not present

## 2018-11-26 DIAGNOSIS — I129 Hypertensive chronic kidney disease with stage 1 through stage 4 chronic kidney disease, or unspecified chronic kidney disease: Secondary | ICD-10-CM | POA: Diagnosis not present

## 2018-11-29 ENCOUNTER — Telehealth (INDEPENDENT_AMBULATORY_CARE_PROVIDER_SITE_OTHER): Payer: Medicare Other | Admitting: Cardiology

## 2018-11-29 ENCOUNTER — Ambulatory Visit: Payer: Medicare Other | Admitting: Cardiology

## 2018-11-29 ENCOUNTER — Encounter (HOSPITAL_COMMUNITY): Payer: Self-pay | Admitting: Cardiology

## 2018-11-29 ENCOUNTER — Telehealth: Payer: Self-pay | Admitting: *Deleted

## 2018-11-29 VITALS — BP 130/58 | HR 48 | Ht 66.0 in | Wt 182.3 lb

## 2018-11-29 DIAGNOSIS — I2119 ST elevation (STEMI) myocardial infarction involving other coronary artery of inferior wall: Secondary | ICD-10-CM

## 2018-11-29 DIAGNOSIS — E785 Hyperlipidemia, unspecified: Secondary | ICD-10-CM

## 2018-11-29 DIAGNOSIS — I10 Essential (primary) hypertension: Secondary | ICD-10-CM

## 2018-11-29 DIAGNOSIS — I2510 Atherosclerotic heart disease of native coronary artery without angina pectoris: Secondary | ICD-10-CM

## 2018-11-29 DIAGNOSIS — I480 Paroxysmal atrial fibrillation: Secondary | ICD-10-CM

## 2018-11-29 DIAGNOSIS — Z9861 Coronary angioplasty status: Secondary | ICD-10-CM

## 2018-11-29 DIAGNOSIS — I25119 Atherosclerotic heart disease of native coronary artery with unspecified angina pectoris: Secondary | ICD-10-CM

## 2018-11-29 NOTE — Assessment & Plan Note (Signed)
His infarct-related artery is probably the circumflex with occluded RCA.  Hence the fixed inferior hypokinesis, but EF is somewhat preserved at roughly 45 to 50% by echo at the time of his MI.  1 would expect that would probably improved somewhat.  No further debilitating issues from this event.  No heart failure or angina symptoms. He did have A. fib at the time of this event and has not had any further breakthrough episodes.

## 2018-11-29 NOTE — Assessment & Plan Note (Addendum)
Three-vessel CAD with RCA CTO and PCI of the LAD and circumflex in a trifurcation location. No further angina symptoms.  Since he is 1 year out, I think we are okay stopping aspirin at the end of this month, continuing Plavix for long-term.  Okay to hold Plavix for procedures as of this month.Marland Kitchen

## 2018-11-29 NOTE — Assessment & Plan Note (Signed)
He was not placed back on ACE inhibitor/ARB post MI because of renal insufficiency and borderline hypotension.  At this point he is only on low-dose amlodipine having initially been titrated already to 10 mg and now back down to 2.5 mg.

## 2018-11-29 NOTE — Assessment & Plan Note (Signed)
He is on a stable dose of statin.  He should have labs checked by PCP which we can see if are available.  I would imagine that they would not be worse than the LDL of 80 there was noted prior to starting a statin.  He seems to be tolerating current dose without any issues.  No change.

## 2018-11-29 NOTE — Patient Instructions (Addendum)
Medication Instructions:   --Stop amiodarone at the end of current bottle. (By end of June) --Stop taking aspirin at the end of June  If you need a refill on your cardiac medications before your next appointment, please call your pharmacy.   Lab work: Not needed  If you have labs (blood work) drawn today and your tests are completely normal, you will receive your results only by: Marland Kitchen MyChart Message (if you have MyChart) OR . A paper copy in the mail If you have any lab test that is abnormal or we need to change your treatment, we will call you to review the results.  Testing/Procedures: Not needed  Follow-Up: At [facility], you and your health needs are our priority.  As part of our continuing mission to provide you with exceptional heart care, we have created designated Provider Care Teams.  These Care Teams include your primary Cardiologist (physician) and Advanced Practice Providers (APPs -  Physician Assistants and Nurse Practitioners) who all work together to provide you with the care you need, when you need it. . You will need a follow up appointment in 5-6 months Oct /Nov 2020.  Please call our office 2 months in advance to schedule this appointment.  You may see Glenetta Hew, MD or one of the following Advanced Practice Providers on your designated Care Team:   . Rosaria Ferries, PA-C . Jory Sims, DNP, ANP  Any Other Special Instructions Will Be Listed Below (If Applicable).

## 2018-11-29 NOTE — Progress Notes (Signed)
Virtual Visit via Video Note   This visit type was conducted due to national recommendations for restrictions regarding the COVID-19 Pandemic (e.g. social distancing) in an effort to limit this patient's exposure and mitigate transmission in our community.  Due to his co-morbid illnesses, this patient is at least at moderate risk for complications without adequate follow up.  This format is felt to be most appropriate for this patient at this time.  All issues noted in this document were discussed and addressed.  A limited physical exam was performed with this format.  Please refer to the patient's chart for his consent to telehealth for [facility].   Initial 5 minutes spent via video conference, however due to connectivity issues, we switched telephone.  Patient has given verbal permission to conduct this visit via virtual appointment and to bill insurance 11/29/2018 4:35 PM     Evaluation Performed:  Follow-up visit  Date:  11/29/2018   ID:  Paul Kramer, DOB 1929-10-30, MRN [id number]  Patient Location: Home - driving  Provider Location: Home  PCP:  Lajean Manes, MD  Cardiologist:  Glenetta Hew, MD  Electrophysiologist:  None  Nephrologist: Dr. Graylon Gunning  Chief Complaint: 21-month follow-up, CAD  History of Present Illness:    Paul Kramer is a 85 y.o. male with PMH notable for CAD (Inferolateral STEMI June 2019: CTO RCA - Staged PCI LAD & Cx b/c he declined CABG) -  who presents via audio/video conferencing for a telehealth visit today. MI occurred in setting of Afib RVR - has not had recurrence.  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   His son-in-law, Paul Kramer, is an orthopedic PA with [employer].  He does have chronic renal insufficiency followed by Dr. Felipa Eth.  Baseline creatinine is somewhere between 1.6-1.8. - Cr from Nephrologist 03/2018- 1.7; Hgb 10.5 (07/2018)  Paul Kramer was last seen in Dec 2019 (after completing [place]) - was doing well, just  not exercising like he used to b/c cold weather. Off & on pressure sensation on both sides of his chest - not c/w MI related angina.  No Afib.   --> PCP started Amlodpine 2.5 mg --> 5mg  & nephrology increased to 10 mg --> subsequently reduced back to 2.5 mg (BP was a bit low & had swelling)  S/p Dental Implant   Interval History:  Paul Kramer is doing well - but has been scared to outside because of COVID. Is on the last stages of completing Dental Implants -- doing the crowns now.   Cardiovascular ROS: positive for - dyspnea on exertion, edema and Noted that he was short of breath after walking about 3/4 the way around the block, but does not notice it at all when riding his stationary bicycle.; swelling is much better since reducing the dose of amlodipine.  He is now wearing support stockings and elevating his feet at night. negative for - irregular heartbeat, orthopnea, palpitations, paroxysmal nocturnal dyspnea, rapid heart rate, shortness of breath or Syncope/near syncope, TIA shows amaurosis fugax.  Claudication.  Melena, hematochezia, hematuria or epistaxis.  He is noticing easy bleeding or bruising with shaving etc.  The patient does not have symptoms concerning for COVID-19 infection (fever, chills, cough, or new shortness of breath).  The patient is practicing social distancing.  Scared to go outside.  ROS:  Please see the history of present illness.    Review of Systems  Constitutional: Negative for malaise/fatigue and weight loss.  Respiratory: Negative for cough and shortness  of breath (Only when bending over to tie his shoes).   Cardiovascular:       Per HPI  Musculoskeletal: Positive for back pain (DJD - radiates to buttock).  Neurological: Negative for dizziness, tingling, focal weakness and headaches.  Endo/Heme/Allergies: Bruises/bleeds easily.  Psychiatric/Behavioral: The patient has insomnia (Waking up several times a night for nocturia.  Takes naps during the day.).      Past Medical History:  Diagnosis Date  . Hypercholesteremia   . Hypertension   . Multiple vessel coronary artery disease     Cath 11/29/17 - 3 V CAD (CTO RCA), 95% m-d Cx (@ OM2 55%)-> PCI Cx-PTCA Om2; 85% p-m LAD => DES PCI   . Myocardial infarction of inferolateral wall ([place]) 11/2017   ? prior Infarct; - Cath 11/29/17 - 3 V CAD (CTO RCA), 95% m-d Cx (@ OM2 55%)-> PCI Cx-PTCA Om2; 85% p-m LAD => DES PCI   . Narcolepsy    pt may pass out when laughing  . Spinal stenosis   . TIA (transient ischemic attack)     Past Surgical History:  Procedure Laterality Date  . CARDIAC CATHETERIZATION    . CORONARY BALLOON ANGIOPLASTY N/A 11/29/2017   Procedure: CORONARY BALLOON ANGIOPLASTY;  Surgeon: Leonie Man, MD;  Location: [place] [facility];  Service: Cardiovascular;; PTCA of ostOM2 55% -> 20%. (jailed)  . CORONARY STENT INTERVENTION N/A 12/07/2017   Procedure: CORONARY STENT INTERVENTION;  Surgeon: Leonie Man, MD;  Location: [place] [facility];  Service: Cardiovascular;  Laterality: N/A;  . LEFT HEART CATH N/A 12/07/2017   Procedure: Left Heart Cath;  Surgeon: Leonie Man, MD;  Location: [place] [facility];  Service: Cardiovascular;  Laterality: N/A;  . LEFT HEART CATH AND CORONARY ANGIOGRAPHY N/A 11/29/2017   Procedure: LEFT HEART CATH AND CORONARY ANGIOGRAPHY;  Surgeon: Leonie Man, MD;  Location: [facility];; mRCA CTO, p-m LAD 85%, m-dCx 95% @ OM2 55%. Mod High LVEDP   . LIPOMA EXCISION    . SPINAL FIXATION SURGERY    . TRANSTHORACIC ECHOCARDIOGRAM  11/30/2017   EF 45-50%.  Inferior hypokinesis.  Elevated filling pressures.  Mild to moderate MR.      Post PCI   Current Meds  Medication Sig  . acetaminophen (TYLENOL) 325 MG tablet Take 325 mg by mouth every 6 (six) hours as needed for mild pain.  Marland Kitchen allopurinol (ZYLOPRIM) 100 MG tablet Take 100 mg by mouth every morning.  Marland Kitchen amLODipine (NORVASC) 2.5 MG tablet Take 1 tablet by mouth at bedtime.  Marland Kitchen atorvastatin (LIPITOR)  40 MG tablet Take 1 tablet (40 mg total) by mouth daily at 6 PM.  . clopidogrel (PLAVIX) 75 MG tablet Take 1 tablet (75 mg total) by mouth daily.  . cyanocobalamin 1000 MCG tablet Take 1 tablet (1,000 mcg total) by mouth daily.  . famotidine (PEPCID) 20 MG tablet Take 20 mg by mouth 2 (two) times daily.  . Multiple Vitamins-Minerals (CENTRUM SILVER PO) Take 1 tablet by mouth daily.  . nitroGLYCERIN (NITROSTAT) 0.4 MG SL tablet Place 1 tablet (0.4 mg total) under the tongue every 5 (five) minutes x 3 doses as needed for chest pain.  Marland Kitchen Psyllium (METAMUCIL FIBER PO) Take by mouth.  . terazosin (HYTRIN) 2 MG capsule Take 4 mg by mouth every evening.  Marland Kitchen VITAMIN D, ERGOCALCIFEROL, PO Take 5,000 Units by mouth daily.   . [DISCONTINUED] amiodarone (PACERONE) 100 MG tablet Take 1 tablet (100 mg total) by mouth  daily.  . [DISCONTINUED] aspirin EC 81 MG tablet Take 81 mg by mouth daily.     Allergies:   Other; Contrast media [iodinated diagnostic agents]; and Morphine and related   Social History   Tobacco Use  . Smoking status: Former Research scientist (life sciences)  . Smokeless tobacco: Never Used  Substance Use Topics  . Alcohol use: Yes    Comment: rare  . Drug use: Never     Family Hx: The patient's Family history is unknown by patient.  His parents did not go to the doctor much.   Prior CV studies:   The following studies were reviewed today: . No recent studies  Labs/Other Tests and Data Reviewed:    EKG:  No ECG reviewed.  Recent Labs: 12/03/2017: B Natriuretic Peptide 201.8 12/04/2017: TSH 5.308 12/11/2017: Magnesium 1.7 12/12/2017: BUN 28; Creatinine, Ser 1.93; Hemoglobin 9.7; Platelets 176; Potassium 4.2; Sodium 135   Recent Lipid Panel Lab Results  Component Value Date/Time   CHOL 137 11/30/2017 12:17 AM   TRIG 93 11/30/2017 12:17 AM   HDL 38 (L) 11/30/2017 12:17 AM   CHOLHDL 3.6 11/30/2017 12:17 AM   LDLCALC 80 11/30/2017 12:17 AM   PCP checked in ~ January.   Wt Readings from Last 3  Encounters:  11/29/18 182 lb 4.8 oz (82.7 kg)  06/01/18 181 lb 12.8 oz (82.5 kg)  05/05/18 180 lb 1.9 oz (81.7 kg)     Objective:    Vital Signs:  BP (!) 130/58   Pulse (!) 48   Ht 5\' 6"  (1.676 m)   Wt 182 lb 4.8 oz (82.7 kg)   BMI 29.42 kg/m   VITAL SIGNS:  reviewed GEN:  Well nourished, well developed male in no acute distress. RESPIRATORY:  normal respiratory effort, symmetric expansion CARDIOVASCULAR:  no peripheral edema NEURO:  alert and oriented x 3, no obvious focal deficit PSYCH:  normal affect   ASSESSMENT & PLAN:    Problem List Items Addressed This Visit    Acute ST elevation myocardial infarction (STEMI) ([place]) - Primary    His infarct-related artery is probably the circumflex with occluded RCA.  Hence the fixed inferior hypokinesis, but EF is somewhat preserved at roughly 45 to 50% by echo at the time of his MI.  1 would expect that would probably improved somewhat.  No further debilitating issues from this event.  No heart failure or angina symptoms. He did have A. fib at the time of this event and has not had any further breakthrough episodes.      Relevant Medications   amLODipine (NORVASC) 2.5 MG tablet   CAD S/P percutaneous coronary angioplasty (Chronic)    Three-vessel CAD with RCA CTO and PCI of the LAD and circumflex in a trifurcation location. No further angina symptoms.  Since he is 1 year out, I think we are okay stopping aspirin at the end of this month, continuing Plavix for long-term.  Okay to hold Plavix for procedures as of this month..        Relevant Medications   amLODipine (NORVASC) 2.5 MG tablet   Coronary artery disease involving native coronary artery of native heart with angina pectoris (HCC) (Chronic)    As noted, no active angina or heart failure symptoms.  EF relatively preserved by echocardiogram but not unexpectedly inferior hypokinesis from the occluded RCA.  Circumflex did appear to be at least codominant based on the size.   Plan:  Stopping aspirin at the end of this month and continue Plavix for  long-term coverage (okay to hold Plavix as of now for any procedures)  On stable dose of statin  On amlodipine at reduced dose now.  Not currently on beta-blocker because of bradycardia issues and fatigue. -->  We will be weaning off amiodarone and will be to reassess in the future.  Not on ACE inhibitor/ARB because of renal insufficiency issues and prior hypotension.         Relevant Medications   amLODipine (NORVASC) 2.5 MG tablet   Essential hypertension (Chronic)    He was not placed back on ACE inhibitor/ARB post MI because of renal insufficiency and borderline hypotension.  At this point he is only on low-dose amlodipine having initially been titrated already to 10 mg and now back down to 2.5 mg.      Relevant Medications   amLODipine (NORVASC) 2.5 MG tablet   Hyperlipidemia with target low density lipoprotein (LDL) cholesterol less than 70 mg/dL (Chronic)    He is on a stable dose of statin.  He should have labs checked by PCP which we can see if are available.  I would imagine that they would not be worse than the LDL of 80 there was noted prior to starting a statin.  He seems to be tolerating current dose without any issues.  No change.      Relevant Medications   amLODipine (NORVASC) 2.5 MG tablet   PAF (paroxysmal atrial fibrillation) (HCC); CHA2DS2-VASc Score -7 (Chronic)    He had A. fib at the time of his inferior MI, but has not had any further breakthrough episodes while on amiodarone. Because of bradycardia we cut him back to to 100 mg daily and I think at the end of this month we can stop altogether.  At that point we can reassess for any breakthrough episodes..  With him not having had any breakthrough episodes I think were fine simply staying on Plavix alone, but if there are breakthroughs we would probably consider DOAC and aspirin as opposed to Plavix.  Unless he shows signs of  tachycardia, with hold off on beta-blocker after stopping amiodarone.      Relevant Medications   amLODipine (NORVASC) 2.5 MG tablet      COVID-19 Education: The signs and symptoms of COVID-19 were discussed with the patient and how to seek care for testing (follow up with PCP or arrange E-visit).   The importance of social distancing was discussed today.  Time:   Today, I have spent 22 minutes with the patient with telehealth technology discussing the above problems.    Medication Adjustments/Labs and Tests Ordered: Current medicines are reviewed at length with the patient today.  Concerns regarding medicines are outlined above.  Medication Instructions:   --Stop amiodarone at the end of current bottle. (By end of June)) --Stop taking aspirin at the end of June  Tests Ordered: No orders of the defined types were placed in this encounter.   Medication Changes: No orders of the defined types were placed in this encounter. See above no new prescriptions.  Disposition:  Follow up in 5 month(s)    Signed, Glenetta Hew, MD  11/29/2018 4:35 PM    [place]

## 2018-11-29 NOTE — Telephone Encounter (Signed)
spoke daughter instruction given from tele visit - 11/29/18 -- avs summary will sent via mychart. Daughter verbalized understanding

## 2018-11-29 NOTE — Assessment & Plan Note (Addendum)
As noted, no active angina or heart failure symptoms.  EF relatively preserved by echocardiogram but not unexpectedly inferior hypokinesis from the occluded RCA.  Circumflex did appear to be at least codominant based on the size.  Plan:  Stopping aspirin at the end of this month and continue Plavix for long-term coverage (okay to hold Plavix as of now for any procedures)  On stable dose of statin  On amlodipine at reduced dose now.  Not currently on beta-blocker because of bradycardia issues and fatigue. -->  We will be weaning off amiodarone and will be to reassess in the future.  Not on ACE inhibitor/ARB because of renal insufficiency issues and prior hypotension.

## 2018-11-29 NOTE — Assessment & Plan Note (Signed)
He had A. fib at the time of his inferior MI, but has not had any further breakthrough episodes while on amiodarone. Because of bradycardia we cut him back to to 100 mg daily and I think at the end of this month we can stop altogether.  At that point we can reassess for any breakthrough episodes..  With him not having had any breakthrough episodes I think were fine simply staying on Plavix alone, but if there are breakthroughs we would probably consider DOAC and aspirin as opposed to Plavix.  Unless he shows signs of tachycardia, with hold off on beta-blocker after stopping amiodarone.

## 2018-12-02 DIAGNOSIS — H2513 Age-related nuclear cataract, bilateral: Secondary | ICD-10-CM | POA: Diagnosis not present

## 2018-12-30 ENCOUNTER — Other Ambulatory Visit: Payer: Self-pay | Admitting: Cardiology

## 2019-01-13 IMAGING — DX DG CHEST 2V
2 series · 2 of 2 positions shown · non-contrast
Comparison: None.

CLINICAL DATA: Worsening shortness of breath with chest tightness.

EXAM:
CHEST - 2 VIEW

[w chest pa]
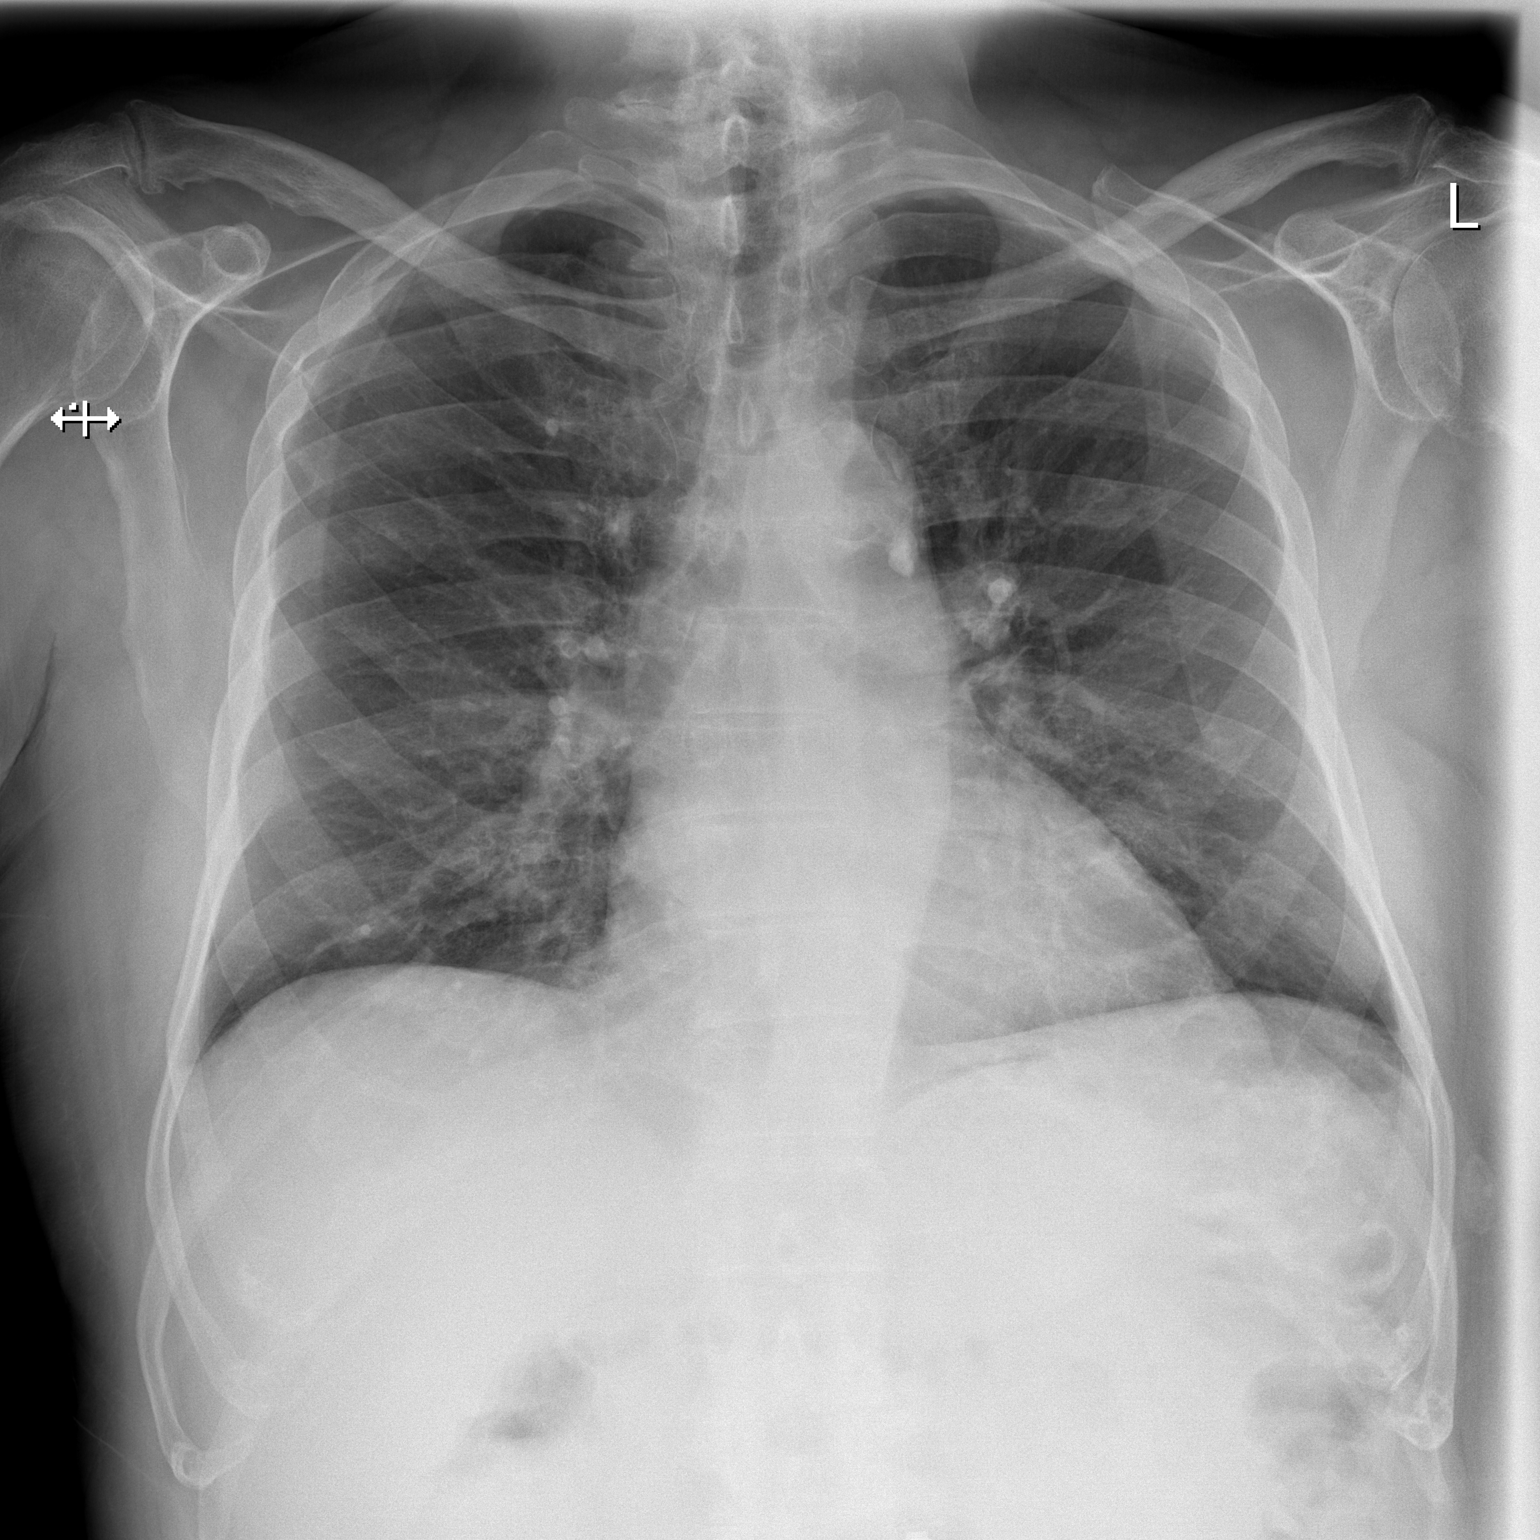

[w chest lat]
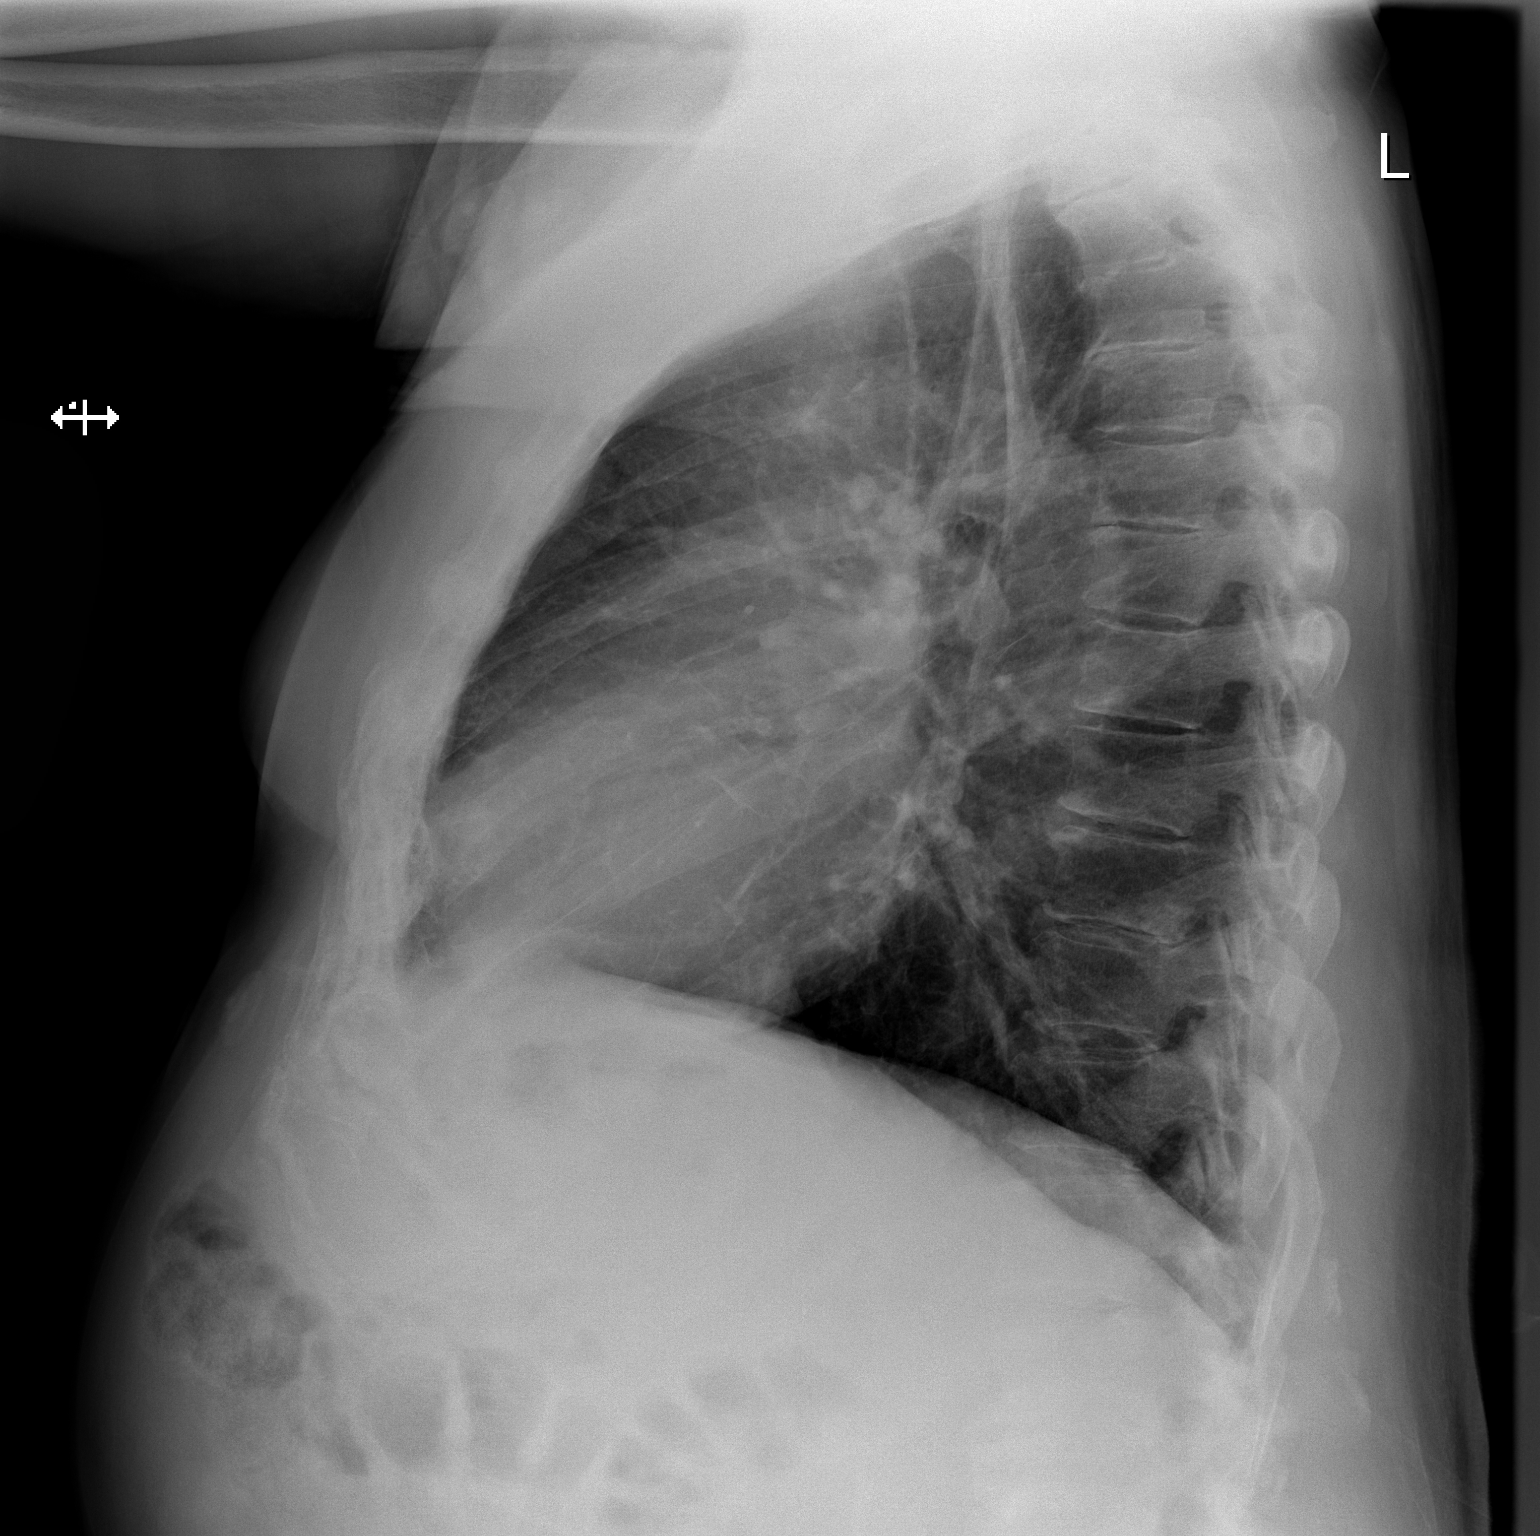

[2 of 2 positions shown; findings below may reference images not displayed]

FINDINGS: The cardiac silhouette is normal in size. Aortic atherosclerosis is
noted. There are small calcified mediastinal and hilar lymph nodes
suggesting prior granulomatous infection. The lungs are normally to
mildly hyperinflated without evidence of airspace consolidation,
edema, pleural effusion, pneumothorax. No acute osseous abnormality
is seen.
IMPRESSION: No active cardiopulmonary disease.

## 2019-01-17 IMAGING — DX DG CHEST 1V PORT
1 series · 1 of 1 positions shown · non-contrast
Comparison: 11/29/2017

CLINICAL DATA: Productive cough, CHF

EXAM:
PORTABLE CHEST 1 VIEW

[chest]
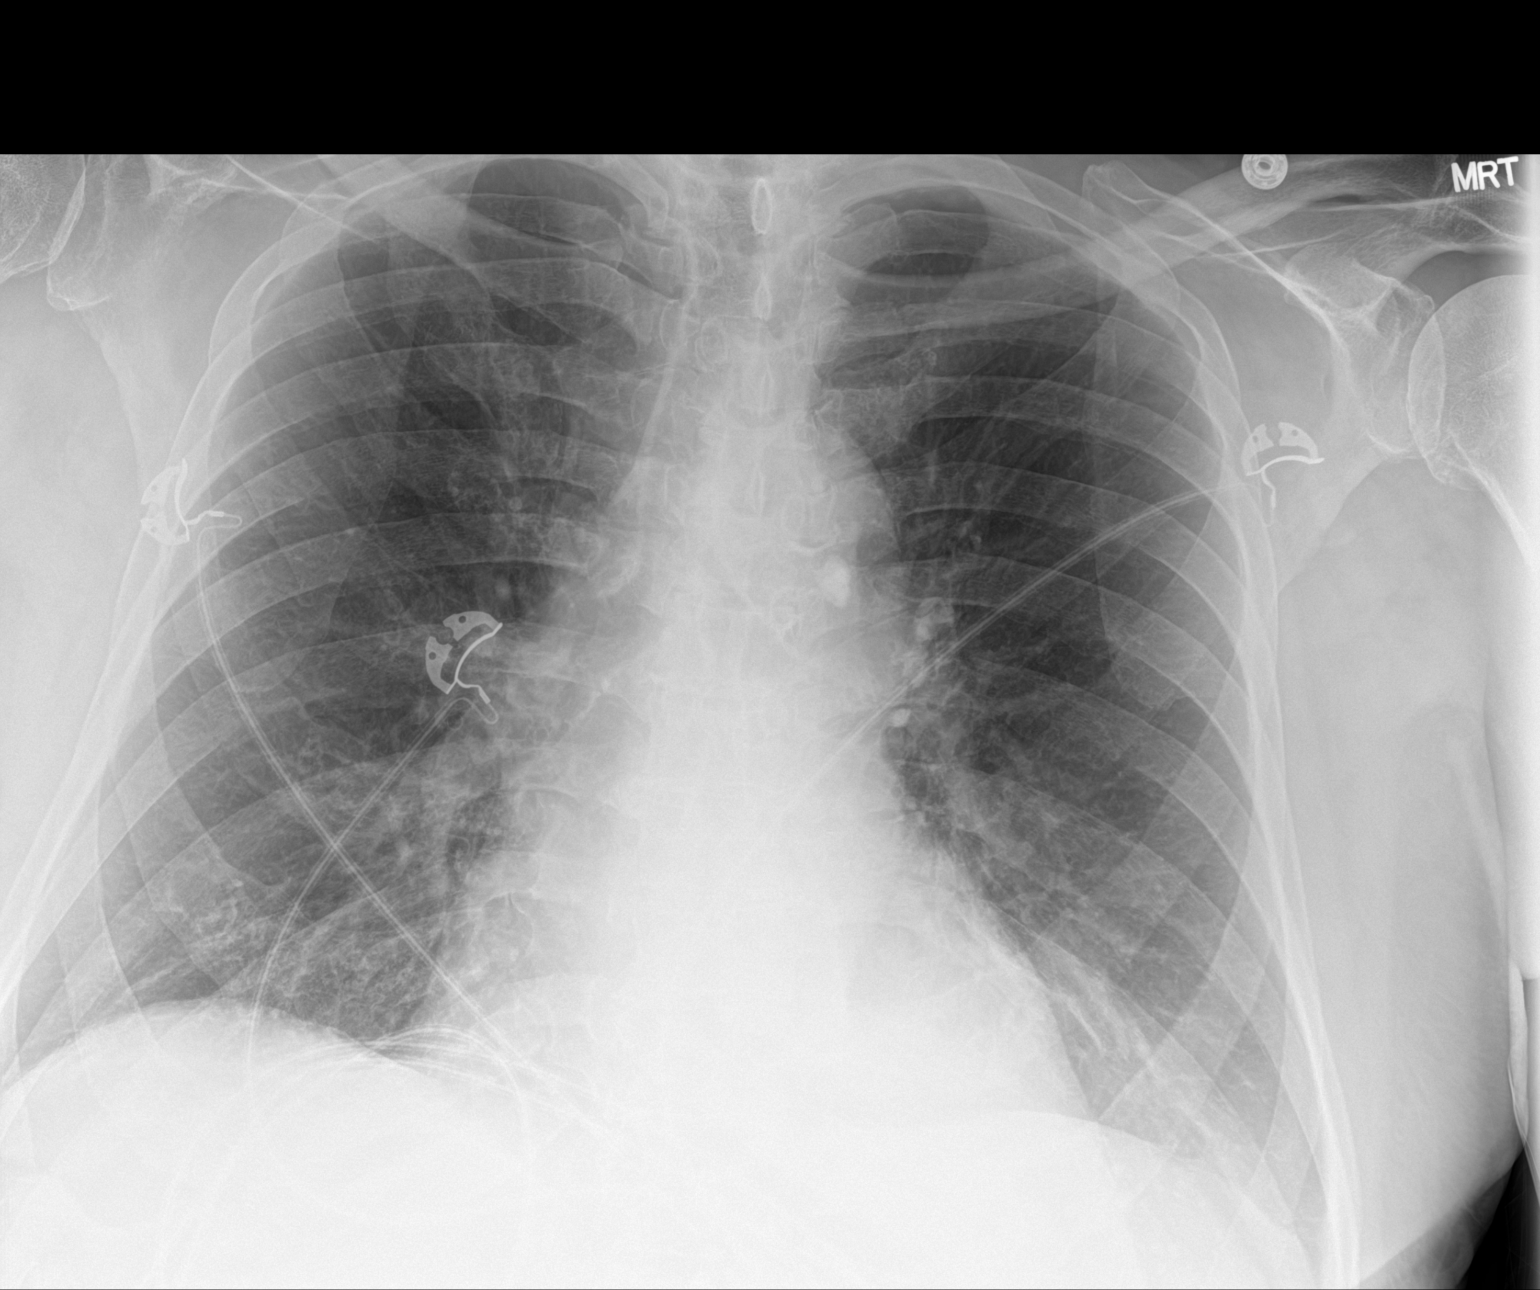

[1 of 1 positions shown; findings below may reference images not displayed]

FINDINGS: Calcified lymph nodes in the left hilum and mediastinum. Heart is
normal size. Increasing linear densities in the lung bases, likely
atelectasis. No effusions or acute bony abnormality.
IMPRESSION: Bibasilar atelectasis.

## 2019-01-24 ENCOUNTER — Other Ambulatory Visit: Payer: Self-pay | Admitting: Cardiology

## 2019-01-27 ENCOUNTER — Other Ambulatory Visit: Payer: Self-pay | Admitting: Cardiology

## 2019-01-31 ENCOUNTER — Telehealth: Payer: Self-pay

## 2019-01-31 NOTE — Telephone Encounter (Signed)
Not needed

## 2019-03-02 DIAGNOSIS — N183 Chronic kidney disease, stage 3 (moderate): Secondary | ICD-10-CM | POA: Diagnosis not present

## 2019-03-02 DIAGNOSIS — Z23 Encounter for immunization: Secondary | ICD-10-CM | POA: Diagnosis not present

## 2019-03-02 DIAGNOSIS — Z79899 Other long term (current) drug therapy: Secondary | ICD-10-CM | POA: Diagnosis not present

## 2019-03-02 DIAGNOSIS — I700 Atherosclerosis of aorta: Secondary | ICD-10-CM | POA: Diagnosis not present

## 2019-03-02 DIAGNOSIS — E7800 Pure hypercholesterolemia, unspecified: Secondary | ICD-10-CM | POA: Diagnosis not present

## 2019-03-02 DIAGNOSIS — Z0000 Encounter for general adult medical examination without abnormal findings: Secondary | ICD-10-CM | POA: Diagnosis not present

## 2019-03-02 DIAGNOSIS — I129 Hypertensive chronic kidney disease with stage 1 through stage 4 chronic kidney disease, or unspecified chronic kidney disease: Secondary | ICD-10-CM | POA: Diagnosis not present

## 2019-03-02 DIAGNOSIS — F5101 Primary insomnia: Secondary | ICD-10-CM | POA: Diagnosis not present

## 2019-03-02 DIAGNOSIS — Z1389 Encounter for screening for other disorder: Secondary | ICD-10-CM | POA: Diagnosis not present

## 2019-04-27 DIAGNOSIS — N2581 Secondary hyperparathyroidism of renal origin: Secondary | ICD-10-CM | POA: Diagnosis not present

## 2019-04-27 DIAGNOSIS — N183 Chronic kidney disease, stage 3 unspecified: Secondary | ICD-10-CM | POA: Diagnosis not present

## 2019-04-27 DIAGNOSIS — D631 Anemia in chronic kidney disease: Secondary | ICD-10-CM | POA: Diagnosis not present

## 2019-04-27 DIAGNOSIS — I129 Hypertensive chronic kidney disease with stage 1 through stage 4 chronic kidney disease, or unspecified chronic kidney disease: Secondary | ICD-10-CM | POA: Diagnosis not present

## 2019-05-02 ENCOUNTER — Encounter: Payer: Self-pay | Admitting: Cardiology

## 2019-05-02 ENCOUNTER — Ambulatory Visit (INDEPENDENT_AMBULATORY_CARE_PROVIDER_SITE_OTHER): Payer: Medicare Other | Admitting: Cardiology

## 2019-05-02 ENCOUNTER — Other Ambulatory Visit: Payer: Self-pay

## 2019-05-02 VITALS — BP 144/70 | HR 53 | Ht 66.0 in | Wt 191.0 lb

## 2019-05-02 DIAGNOSIS — I10 Essential (primary) hypertension: Secondary | ICD-10-CM | POA: Diagnosis not present

## 2019-05-02 DIAGNOSIS — E785 Hyperlipidemia, unspecified: Secondary | ICD-10-CM | POA: Diagnosis not present

## 2019-05-02 DIAGNOSIS — I480 Paroxysmal atrial fibrillation: Secondary | ICD-10-CM

## 2019-05-02 DIAGNOSIS — I25119 Atherosclerotic heart disease of native coronary artery with unspecified angina pectoris: Secondary | ICD-10-CM

## 2019-05-02 DIAGNOSIS — I2510 Atherosclerotic heart disease of native coronary artery without angina pectoris: Secondary | ICD-10-CM | POA: Diagnosis not present

## 2019-05-02 DIAGNOSIS — Z9861 Coronary angioplasty status: Secondary | ICD-10-CM

## 2019-05-02 DIAGNOSIS — I255 Ischemic cardiomyopathy: Secondary | ICD-10-CM

## 2019-05-02 NOTE — Progress Notes (Signed)
PCP: Lajean Manes, MD  Clinic Note: Chief Complaint  Patient presents with  . Follow-up    No major issues  . Coronary Artery Disease    No angina   HPI:     Paul Kramer is a 85 y.o. male with PMH notable for Inf STEMI (June 2019 - CTO RCA, DES PCI pLAD & LCx@ OM2), HTN, HLD, CKD III (Cr 1.6-1.8, followed by Dr. Felipa Eth.) & prior TIA who presents today for 4-5 month f/u.  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 His son-in-law, [name], is an orthopedic PA with [employer].   Cardiac History:   November 29, 2017: Inferolateral ST elevations in new onset A. fib.  --> Converted to NSR on amiodarone. ? Cardiac cath: MV CAD however they decided he did not want to undergo CABG. (RCA was CTO)  --> complicated by large forearm hematoma post Cath & CIN. ? June 10 - staged PCI LAD & Cx.  Jule Destefano was last seen on June 1st via TeleHealth --> stop ASA in July. No Beta Blocker 2/2 fatigue & bradycardia. No ACE-I/ARB 2/2 hypotension & CKD.    Recent Hospitalizations: n/a  Reviewed  CV studies:    The following studies were reviewed today: (if available, images/films reviewed: From Epic Chart or Care Everywhere) . none:   Interval History:    Paul Kramer presents for in person follow-up fully protected with mask and face shield.  He is here by himself which is somewhat problematic because he is a relatively poor historian.  Unfortunately, because of the COVID-19 pandemic scare, he has pretty much coordinate himself up in the house.  He barely even goes out outside.  He is no longer going to the gym to do his exercise.  He is trying to stay active around the house, but he used to be much more active doing at least 30 to 50 minutes a day with exercise.  He lost his balance about a week ago and hurt his right knee.  He is due to see orthopedics for this soon.  He denied any syncope or near syncope.  It was more of a mechanical fall where he lost his balance.   Unfortunately, of late, he has not been doing the routine exercise and is unable to really tell me if he is having real exertional dyspnea.  No chest pain or pressure.  During the summer months, he did do some gardening, but now that the weather is changed, he is not doing that anymore.  Swelling is pretty well controlled.  He does wear support stockings and elevates his feet at night.  Cardiovascular review of symptoms (Summary: positive for - dyspnea on exertion and Still has some easy bruising but no significant bleeding negative for - edema, irregular heartbeat, orthopnea, palpitations, paroxysmal nocturnal dyspnea, rapid heart rate, shortness of breath or Syncope/near syncope, TIA/amaurosis fugax, claudication  The patient does not have symptoms concerning for COVID-19 infection (fever, chills, cough, or new shortness of breath).  The patient is practicing social distancing. +++ Masking and uses face shield.  He does not go out or away from the house for groceries/shopping or other activities.  Try to stay active around the house.   REVIEWED OF SYSTEMS   ROS: A comprehensive was performed. Review of Systems  Constitutional: Positive for malaise/fatigue (Just does not have energy). Negative for weight loss (Has actually gained weight because of COVID-19).  HENT: Negative for congestion and nosebleeds.   Respiratory: Negative for  cough, shortness of breath (Only if he exerts significantly) and wheezing.   Cardiovascular: Positive for leg swelling (Minimal).  Gastrointestinal: Negative for abdominal pain, blood in stool, heartburn and melena.  Genitourinary: Positive for frequency (Nocturia). Negative for hematuria.  Musculoskeletal: Positive for back pain, falls (Per HPI) and joint pain. Negative for myalgias and neck pain.  Neurological: Negative for dizziness, focal weakness, weakness and headaches.       Somewhat slow unsteady gait  Psychiatric/Behavioral: Positive for memory loss.  Negative for depression. The patient is nervous/anxious (Very anxious with COVID-19). The patient does not have insomnia.        Does not, it also does not seem to be all that engaged oriented to doing activities since COVID-19 hit  All other systems reviewed and are negative.   I have reviewed and (if needed) personally updated the patient's problem list, medications, allergies, past medical and surgical history, social and family history.   PAST MEDICAL HISTORY   Past Medical History:  Diagnosis Date  . Hypercholesteremia   . Hypertension   . Multiple vessel coronary artery disease     Cath 11/29/17 - 3 V CAD (CTO RCA), 95% m-d Cx (@ OM2 55%)-> PCI Cx-PTCA Om2; 85% p-m LAD => DES PCI   . Myocardial infarction of inferolateral wall ([place]) 11/2017   ? prior Infarct; - Cath 11/29/17 - 3 V CAD (CTO RCA), 95% m-d Cx (@ OM2 55%)-> PCI Cx-PTCA Om2; 85% p-m LAD => DES PCI   . Narcolepsy    pt may pass out when laughing  . Spinal stenosis   . TIA (transient ischemic attack)    PAST SURGICAL HISTORY   Past Surgical History:  Procedure Laterality Date  . CORONARY BALLOON ANGIOPLASTY N/A 11/29/2017   Procedure: CORONARY BALLOON ANGIOPLASTY;  Surgeon: Leonie Man, MD;  Location: [facility];  Service: Cardiovascular;; PTCA of ostOM2 55% -> 20%. (jailed)  . CORONARY STENT INTERVENTION N/A 12/07/2017   Procedure: CORONARY STENT INTERVENTION;  Surgeon: Leonie Man, MD;  Location: [facility];  Service: Cardiovascular;  Laterality: N/A;  . LEFT HEART CATH N/A 12/07/2017   Procedure: Left Heart Cath;  Surgeon: Leonie Man, MD;  Location: [facility];  Service: Cardiovascular;  Laterality: N/A;  . LEFT HEART CATH AND CORONARY ANGIOGRAPHY N/A 11/29/2017   Procedure: LEFT HEART CATH AND CORONARY ANGIOGRAPHY;  Surgeon: Leonie Man, MD;  Location: [facility];; mRCA CTO, p-m LAD 85%, m-dCx 95% @ OM2 55%. Mod High LVEDP   . LIPOMA EXCISION    . SPINAL FIXATION  SURGERY    . TRANSTHORACIC ECHOCARDIOGRAM  11/30/2017   EF 45-50%.  Inferior hypokinesis.  Elevated filling pressures.  Mild to moderate MR.       Post PCI   MEDICATIONS/ALLERGIES   Current Meds  Medication Sig  . acetaminophen (TYLENOL) 325 MG tablet Take 325 mg by mouth every 6 (six) hours as needed for mild pain.  Marland Kitchen allopurinol (ZYLOPRIM) 100 MG tablet Take 100 mg by mouth every morning.  Marland Kitchen amLODipine (NORVASC) 2.5 MG tablet Take 1 tablet by mouth at bedtime.  Marland Kitchen atorvastatin (LIPITOR) 40 MG tablet TAKE 1 TABLET DAILY AT 6 P.M.  . clopidogrel (PLAVIX) 75 MG tablet TAKE 1 TABLET DAILY  . cyanocobalamin 1000 MCG tablet Take 1 tablet (1,000 mcg total) by mouth daily.  . famotidine (PEPCID) 20 MG tablet Take 20 mg by mouth 2 (two) times daily.  . Multiple Vitamins-Minerals (CENTRUM  SILVER PO) Take 1 tablet by mouth daily.  . nitroGLYCERIN (NITROSTAT) 0.4 MG SL tablet Place 1 tablet (0.4 mg total) under the tongue every 5 (five) minutes x 3 doses as needed for chest pain.  . pantoprazole (PROTONIX) 40 MG tablet TAKE 1 TABLET DAILY  . Psyllium (METAMUCIL FIBER PO) Take by mouth.  . terazosin (HYTRIN) 2 MG capsule Take 4 mg by mouth every evening.  Marland Kitchen VITAMIN D, ERGOCALCIFEROL, PO Take 5,000 Units by mouth daily.   . [DISCONTINUED] amiodarone (PACERONE) 100 MG tablet TAKE 1 TABLET DAILY    Allergies  Allergen Reactions  . Other Shortness Of Breath    Esters  . Contrast Media [Iodinated Diagnostic Agents] Other (See Comments)    Just feel bad all over  . Morphine And Related Other (See Comments)    Patient states he does not like to take Morphine as it gives him nightmares and hallucinations.      SOCIAL HISTORY/FAMILY HISTORY   Social History   Tobacco Use  . Smoking status: Former Research scientist (life sciences)  . Smokeless tobacco: Never Used  Substance Use Topics  . Alcohol use: Yes    Comment: rare  . Drug use: Never   Social History   Social History Narrative  . Not on file    Family  history is unknown by patient.   OBJCTIVE -PE, EKG, labs   Wt Readings from Last 3 Encounters:  05/02/19 191 lb (86.6 kg)  11/29/18 182 lb 4.8 oz (82.7 kg)  06/01/18 181 lb 12.8 oz (82.5 kg)    Physical Exam: BP (!) 144/70   Pulse (!) 53   Ht 5\' 6"  (1.676 m)   Wt 191 lb (86.6 kg)   SpO2 98%   BMI 30.83 kg/m  Physical Exam  Constitutional: He is oriented to person, place, and time. He appears well-developed and well-nourished. No distress.  Pleasant elderly gentleman.  Well-groomed.  Wearing mask plus face shield  HENT:  Head: Normocephalic and atraumatic.  Neck: Normal range of motion. Neck supple.  Cardiovascular: Normal rate, regular rhythm and intact distal pulses. Exam reveals no gallop and no friction rub.  Murmur (Soft 1/6 SEM at RUSB) heard. Pulmonary/Chest: Effort normal and breath sounds normal. No respiratory distress. He has no wheezes. He has no rales.  Abdominal: Soft. Bowel sounds are normal. He exhibits no distension. There is no abdominal tenderness. There is no rebound.  Musculoskeletal: Normal range of motion.        General: Edema (1+) present.  Lymphadenopathy:    He has no cervical adenopathy.  Neurological: He is alert and oriented to person, place, and time.  Skin: Skin is warm and dry.  Psychiatric: He has a normal mood and affect. His behavior is normal. Judgment and thought content normal.  Relatively poor historian    Adult ECG Report  Rate: 53 ;  Rhythm: sinus bradycardia and Other than abnormally placement between V2 and V3 being reversed, relatively stable EKG.  Normal axis, intervals and durations;   Narrative Interpretation: Relatively stable EKG  Recent Labs: March 02, 2019  TC 153, TG 107, HDL 45, LDL 87.  A1c 5.7.  Hgb 12.0.  CR 1.72.  K+ 4.4.   ASSESSMENT/PLAN    Problem List Items Addressed This Visit    PAF (paroxysmal atrial fibrillation) ([place]); CHA2DS2-VASc Score -7 (Chronic)    As far as I can tell, no follow-up  breakthrough episodes of A. fib.  Is no longer on beta-blocker or amiodarone.  At present  remains on Plavix alone.  Provided he has no further breakthrough A. fib spells that are documented, would be leery of anticoagulation. Okay to hold Plavix for procedures      Relevant Orders   EKG 12-Lead (Completed)   Coronary artery disease involving native coronary artery of native heart with angina pectoris (HCC) (Chronic)    No active angina or real heart failure symptoms.  Preserved EF by LV gram and echocardiogram.  Known occluded RCA.  Likely codominant as the circumflex had a pretty significant distribution as well.  He is on Plavix, statin and amlodipine in lieu of beta-blocker and to avoid fatigue and bradycardia.  On amlodipine as opposed to ARB because of renal insufficiency.      Relevant Orders   EKG 12-Lead (Completed)   Essential hypertension (Chronic)   Hyperlipidemia with target low density lipoprotein (LDL) cholesterol less than 70 mg/dL (Chronic)    Remains on 4 mg of Lipitor.  Labs followed by PCP.   LDL is 87.  At this point is much 7 like to back off or change medications, would continue with atorvastatin at 40 mg.  Would potentially consider switching to Crestor.  (Rosuvastatin)      CAD S/P percutaneous coronary angioplasty (Chronic)    Remains on maintenance dose Plavix.  Okay to hold for procedures. Aspirin stopped in June.      Relevant Orders   EKG 12-Lead (Completed)   Ischemic cardiomyopathy - Primary (Chronic)    Minimally reduced EF with inferior hypokinesis related to occluded RCA.  No active heart failure or angina symptoms.      Relevant Orders   EKG 12-Lead (Completed)      COVID-19 Education: The signs and symptoms of COVID-19 were discussed with the patient and how to seek care for testing (follow up with PCP or arrange E-visit).   The importance of social distancing was discussed today.  I spent a total of 4minutes with the patient and chart  review. >  50% of the time was spent in direct patient consultation.  Additional time spent with chart review (studies, outside notes, etc): 6 Total Time: 28 min   Current medicines are reviewed at length with the patient today.  (+/- concerns) nonoe   Patient Instructions / Medication Changes & Studies & Tests Ordered   Patient Instructions  Medication Instructions:  NO CHANGES  *If you need a refill on your cardiac medications before your next appointment, please call your pharmacy*  Lab Work: NOT NEEDED   Testing/Procedures: NOT NEEDED  Follow-Up: At [facility], you and your health needs are our priority.  As part of our continuing mission to provide you with exceptional heart care, we have created designated Provider Care Teams.  These Care Teams include your primary Cardiologist (physician) and Advanced Practice Providers (APPs -  Physician Assistants and Nurse Practitioners) who all work together to provide you with the care you need, when you need it.  Your next appointment:   7 months  The format for your next appointment:   In Person  Provider:   Glenetta Hew, MD  Other Instructions    Studies Ordered:   Orders Placed This Encounter  Procedures  . EKG 12-Lead     Glenetta Hew, M.D., M.S. Interventional Cardiologist   Pager # [phone] Phone # [phone] [address] [place], [place] [address]   Thank you for choosing Heartcare at [facility]!!

## 2019-05-02 NOTE — Patient Instructions (Addendum)
Medication Instructions:  NO CHANGES  *If you need a refill on your cardiac medications before your next appointment, please call your pharmacy*  Lab Work: NOT NEEDED   Testing/Procedures: NOT NEEDED  Follow-Up: At [facility], you and your health needs are our priority.  As part of our continuing mission to provide you with exceptional heart care, we have created designated Provider Care Teams.  These Care Teams include your primary Cardiologist (physician) and Advanced Practice Providers (APPs -  Physician Assistants and Nurse Practitioners) who all work together to provide you with the care you need, when you need it.  Your next appointment:   7 months  The format for your next appointment:   In Person  Provider:   Glenetta Hew, MD  Other Instructions

## 2019-05-04 ENCOUNTER — Encounter: Payer: Self-pay | Admitting: Cardiology

## 2019-05-04 NOTE — Assessment & Plan Note (Signed)
Remains on 4 mg of Lipitor.  Labs followed by PCP.   LDL is 87.  At this point is much 7 like to back off or change medications, would continue with atorvastatin at 40 mg.  Would potentially consider switching to Crestor.  (Rosuvastatin)

## 2019-05-04 NOTE — Assessment & Plan Note (Signed)
As far as I can tell, no follow-up breakthrough episodes of A. fib.  Is no longer on beta-blocker or amiodarone.  At present remains on Plavix alone.  Provided he has no further breakthrough A. fib spells that are documented, would be leery of anticoagulation. Okay to hold Plavix for procedures

## 2019-05-04 NOTE — Assessment & Plan Note (Signed)
Remains on maintenance dose Plavix.  Okay to hold for procedures. Aspirin stopped in June.

## 2019-05-04 NOTE — Assessment & Plan Note (Signed)
No active angina or real heart failure symptoms.  Preserved EF by LV gram and echocardiogram.  Known occluded RCA.  Likely codominant as the circumflex had a pretty significant distribution as well.  He is on Plavix, statin and amlodipine in lieu of beta-blocker and to avoid fatigue and bradycardia.  On amlodipine as opposed to ARB because of renal insufficiency.

## 2019-05-04 NOTE — Assessment & Plan Note (Signed)
Minimally reduced EF with inferior hypokinesis related to occluded RCA.  No active heart failure or angina symptoms.

## 2019-05-08 IMAGING — US US RENAL
1 series · 14 of 25 positions shown · non-contrast
Comparison: Ultrasound March 30, 2016.

CLINICAL DATA: Acute kidney injury.

EXAM:
RENAL / URINARY TRACT ULTRASOUND COMPLETE

[Series 1: us renal · 0.24mm/px · 14 of 34 slices shown]
[im 1/34]
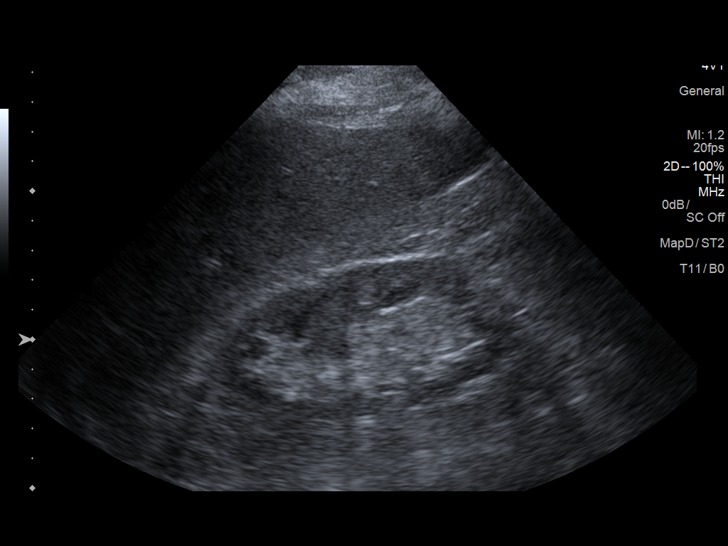
[im 3/34]
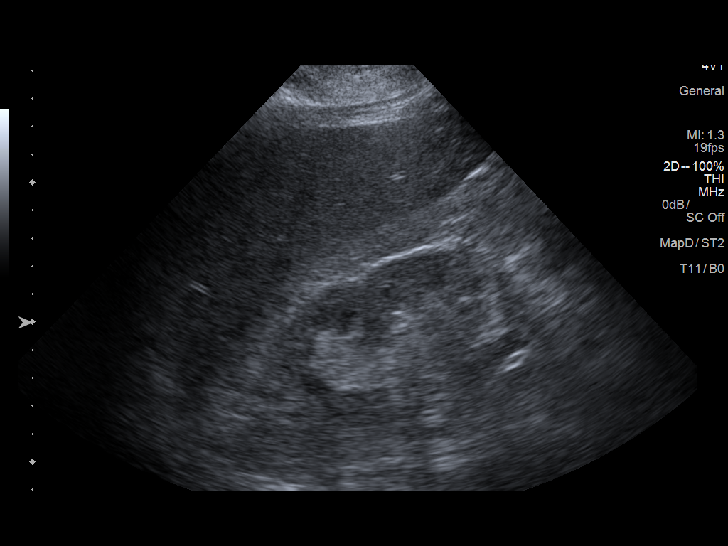
[im 6/34]
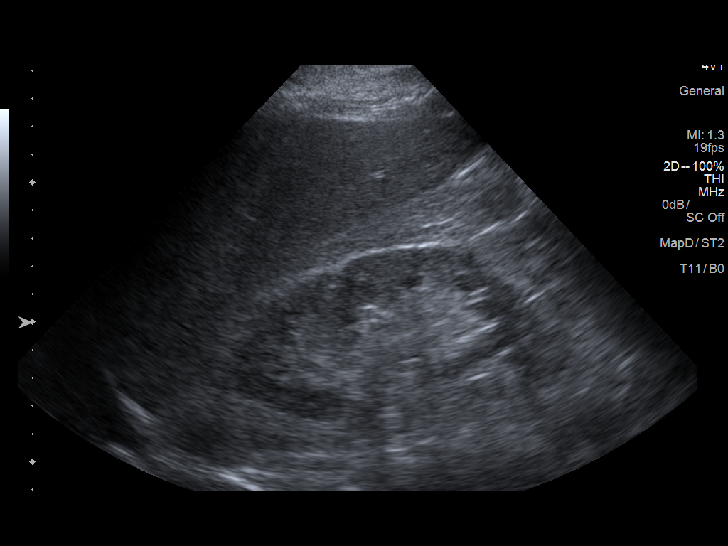
[im 9/34]
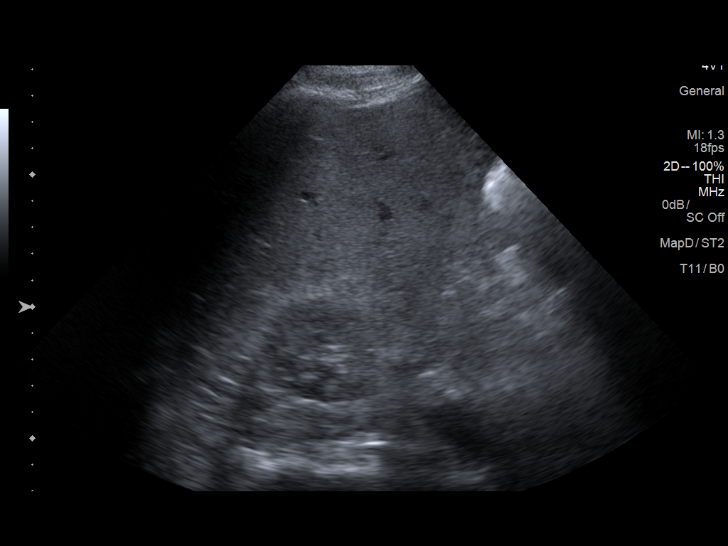
[im 12/34]
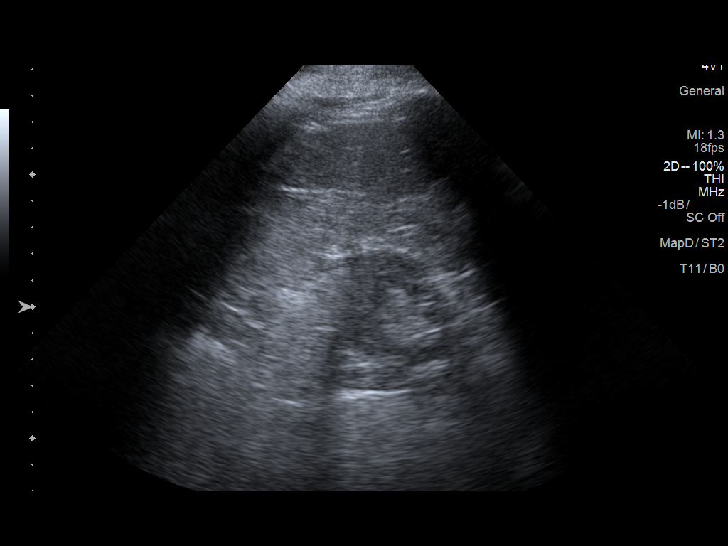
[im 13/34]
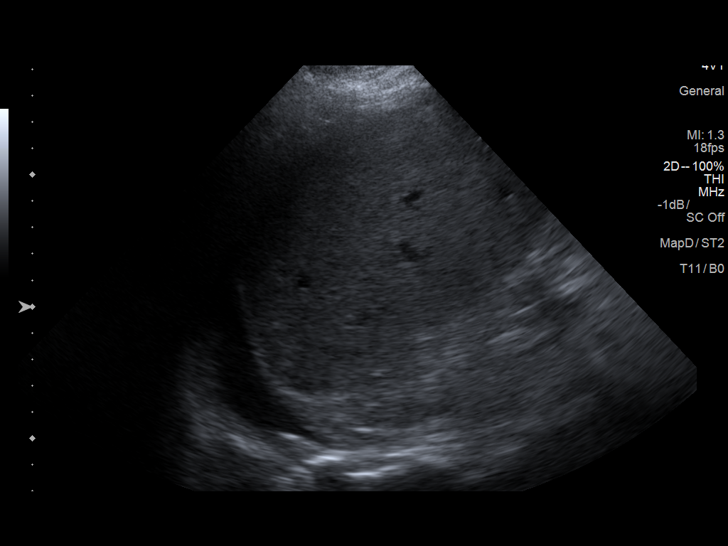
[im 16/34]
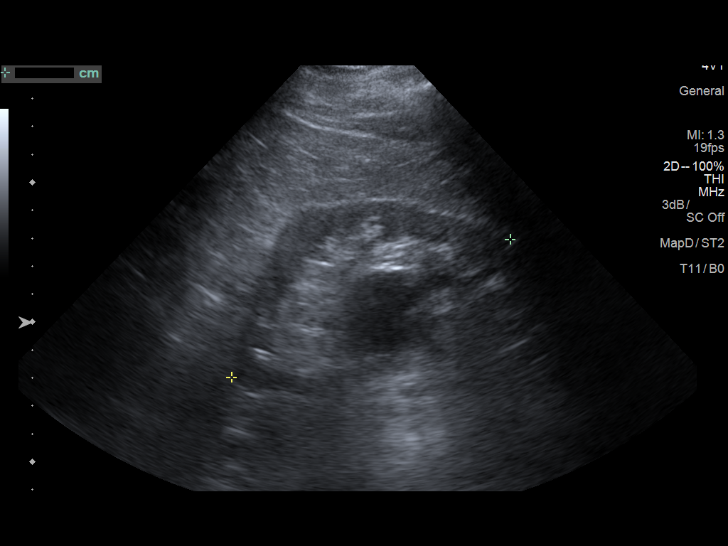
[im 18/34]
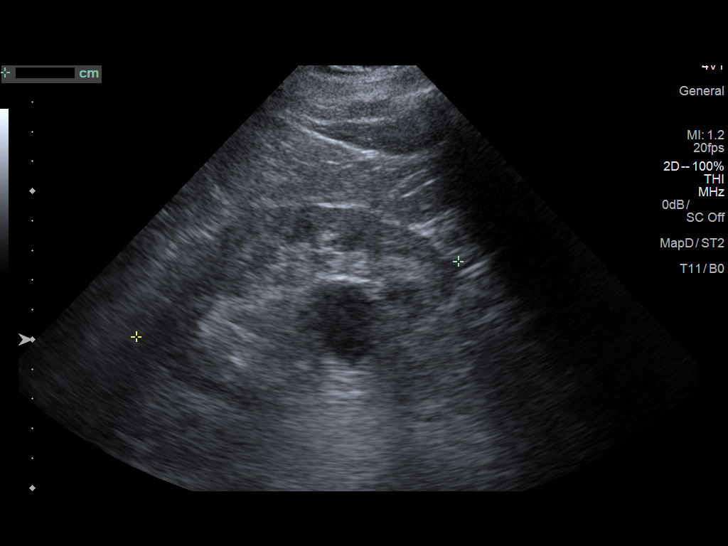
[im 21/34]
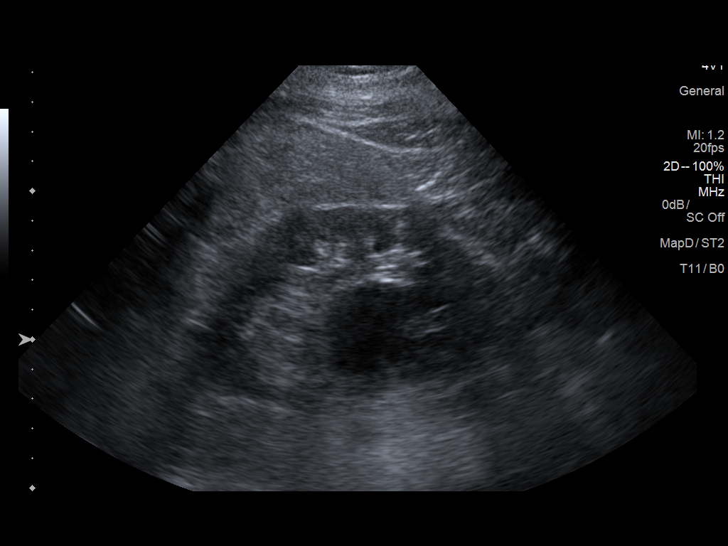
[im 23/34]
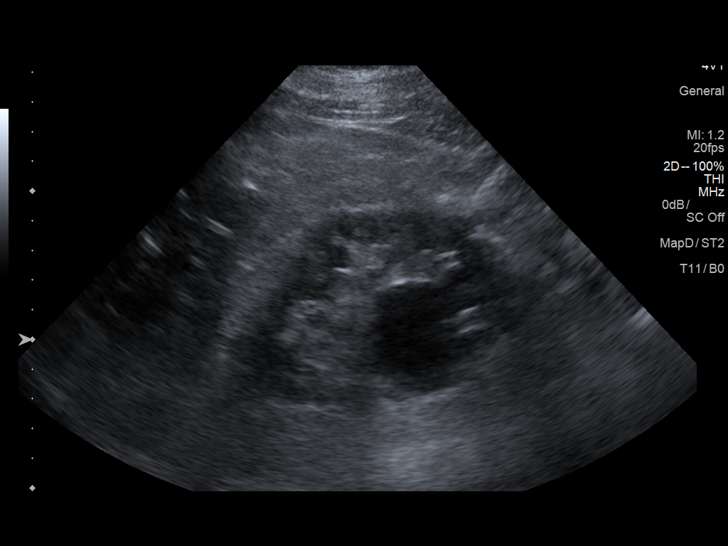
[im 25/34]
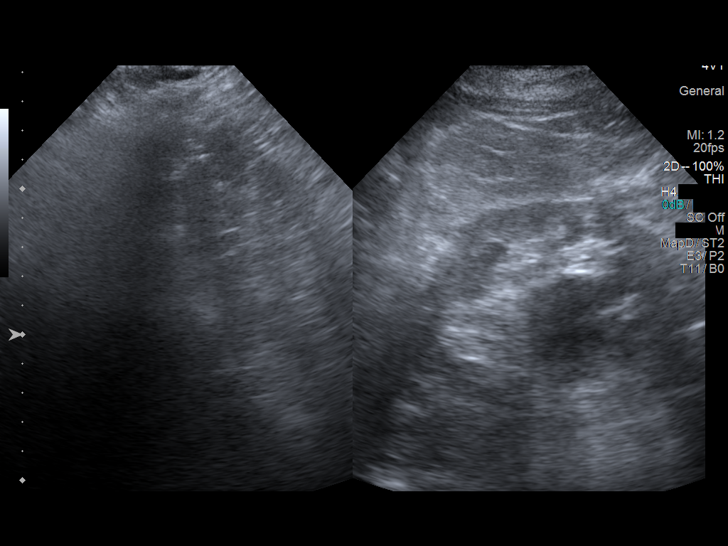
[im 28/34]
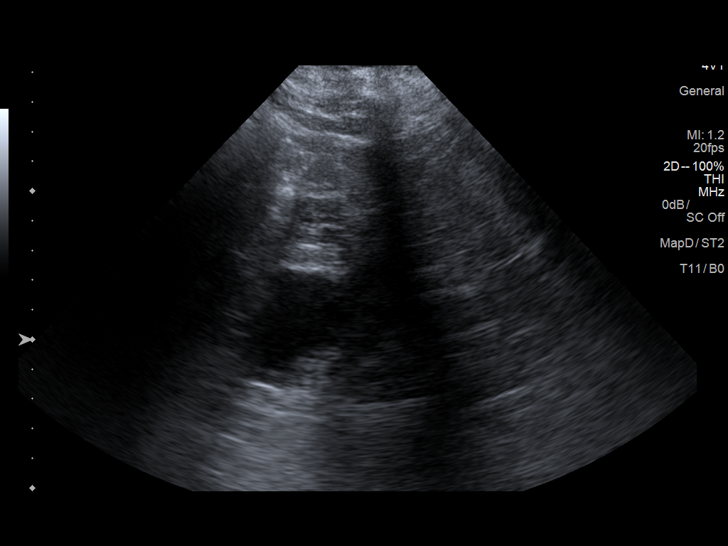
[im 31/34]
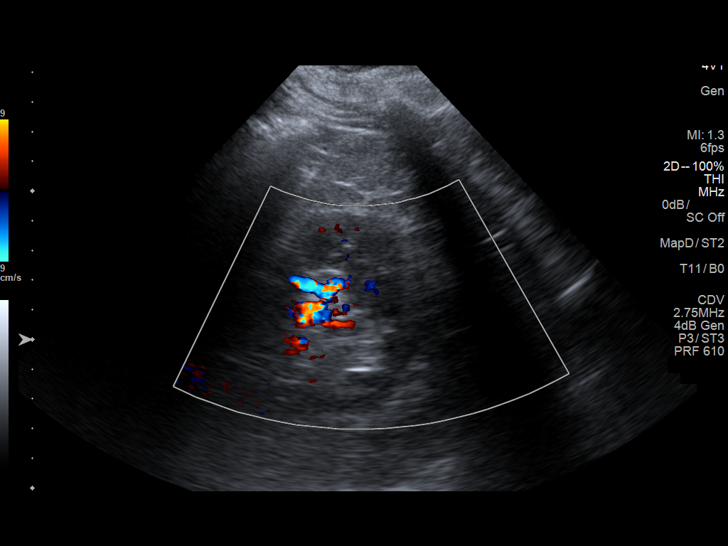
[im 34/34]
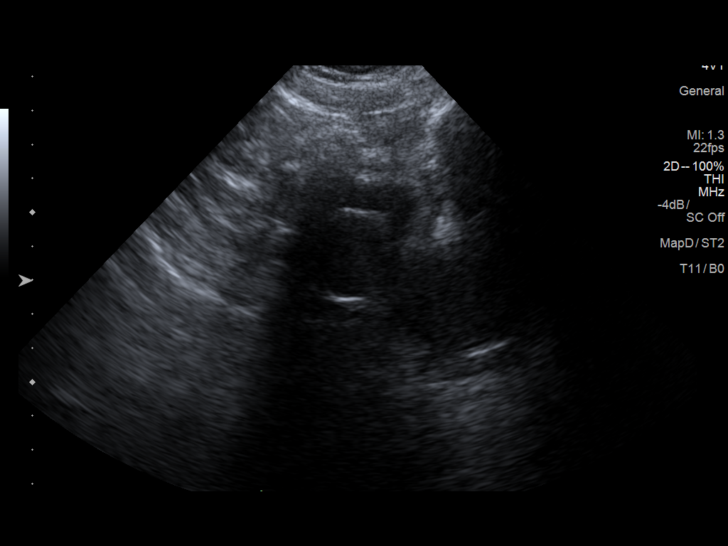

[14 of 25 positions shown; findings below may reference images not displayed]

FINDINGS: Right Kidney:

Length: 10.8 cm. Echogenicity within normal limits. No mass or
hydronephrosis visualized.

Left Kidney:

Length: 11.1 cm. 3.8 cm cyst is noted in the renal pelvis.
Echogenicity within normal limits. No mass or hydronephrosis
visualized.

Bladder:

Decompressed secondary to Foley catheter.
IMPRESSION: 3.8 cm left renal cyst. No other significant renal abnormality seen.

## 2019-07-18 DIAGNOSIS — M170 Bilateral primary osteoarthritis of knee: Secondary | ICD-10-CM | POA: Diagnosis not present

## 2019-07-18 DIAGNOSIS — M1712 Unilateral primary osteoarthritis, left knee: Secondary | ICD-10-CM | POA: Diagnosis not present

## 2019-07-18 DIAGNOSIS — M1711 Unilateral primary osteoarthritis, right knee: Secondary | ICD-10-CM | POA: Diagnosis not present

## 2019-07-18 DIAGNOSIS — M25561 Pain in right knee: Secondary | ICD-10-CM | POA: Diagnosis not present

## 2019-08-07 ENCOUNTER — Other Ambulatory Visit: Payer: Self-pay | Admitting: Cardiology

## 2019-08-08 ENCOUNTER — Other Ambulatory Visit: Payer: Self-pay | Admitting: Physician Assistant

## 2019-08-25 DIAGNOSIS — M1711 Unilateral primary osteoarthritis, right knee: Secondary | ICD-10-CM | POA: Diagnosis not present

## 2019-08-25 DIAGNOSIS — M25561 Pain in right knee: Secondary | ICD-10-CM | POA: Diagnosis not present

## 2019-09-01 DIAGNOSIS — M1711 Unilateral primary osteoarthritis, right knee: Secondary | ICD-10-CM | POA: Diagnosis not present

## 2019-09-08 DIAGNOSIS — M1711 Unilateral primary osteoarthritis, right knee: Secondary | ICD-10-CM | POA: Diagnosis not present

## 2019-09-14 DIAGNOSIS — E7800 Pure hypercholesterolemia, unspecified: Secondary | ICD-10-CM | POA: Diagnosis not present

## 2019-09-14 DIAGNOSIS — I270 Primary pulmonary hypertension: Secondary | ICD-10-CM | POA: Diagnosis not present

## 2019-09-14 DIAGNOSIS — I480 Paroxysmal atrial fibrillation: Secondary | ICD-10-CM | POA: Diagnosis not present

## 2019-09-14 DIAGNOSIS — I129 Hypertensive chronic kidney disease with stage 1 through stage 4 chronic kidney disease, or unspecified chronic kidney disease: Secondary | ICD-10-CM | POA: Diagnosis not present

## 2019-09-14 DIAGNOSIS — I700 Atherosclerosis of aorta: Secondary | ICD-10-CM | POA: Diagnosis not present

## 2019-09-14 DIAGNOSIS — D692 Other nonthrombocytopenic purpura: Secondary | ICD-10-CM | POA: Diagnosis not present

## 2019-09-14 DIAGNOSIS — N1832 Chronic kidney disease, stage 3b: Secondary | ICD-10-CM | POA: Diagnosis not present

## 2019-11-23 DIAGNOSIS — I129 Hypertensive chronic kidney disease with stage 1 through stage 4 chronic kidney disease, or unspecified chronic kidney disease: Secondary | ICD-10-CM | POA: Diagnosis not present

## 2019-11-23 DIAGNOSIS — D631 Anemia in chronic kidney disease: Secondary | ICD-10-CM | POA: Diagnosis not present

## 2019-11-23 DIAGNOSIS — N183 Chronic kidney disease, stage 3 unspecified: Secondary | ICD-10-CM | POA: Diagnosis not present

## 2019-11-23 DIAGNOSIS — N2581 Secondary hyperparathyroidism of renal origin: Secondary | ICD-10-CM | POA: Diagnosis not present

## 2019-11-23 DIAGNOSIS — N189 Chronic kidney disease, unspecified: Secondary | ICD-10-CM | POA: Diagnosis not present

## 2019-12-09 ENCOUNTER — Other Ambulatory Visit: Payer: Self-pay | Admitting: Cardiology

## 2020-01-11 ENCOUNTER — Other Ambulatory Visit: Payer: Self-pay

## 2020-01-11 ENCOUNTER — Ambulatory Visit (INDEPENDENT_AMBULATORY_CARE_PROVIDER_SITE_OTHER): Payer: Medicare Other | Admitting: Cardiology

## 2020-01-11 VITALS — BP 126/62 | HR 55 | Ht 60.0 in | Wt 192.6 lb

## 2020-01-11 DIAGNOSIS — Z9861 Coronary angioplasty status: Secondary | ICD-10-CM

## 2020-01-11 DIAGNOSIS — E785 Hyperlipidemia, unspecified: Secondary | ICD-10-CM | POA: Diagnosis not present

## 2020-01-11 DIAGNOSIS — I10 Essential (primary) hypertension: Secondary | ICD-10-CM

## 2020-01-11 DIAGNOSIS — M25561 Pain in right knee: Secondary | ICD-10-CM | POA: Diagnosis not present

## 2020-01-11 DIAGNOSIS — I2119 ST elevation (STEMI) myocardial infarction involving other coronary artery of inferior wall: Secondary | ICD-10-CM | POA: Diagnosis not present

## 2020-01-11 DIAGNOSIS — I2510 Atherosclerotic heart disease of native coronary artery without angina pectoris: Secondary | ICD-10-CM | POA: Diagnosis not present

## 2020-01-11 DIAGNOSIS — I25119 Atherosclerotic heart disease of native coronary artery with unspecified angina pectoris: Secondary | ICD-10-CM | POA: Diagnosis not present

## 2020-01-11 DIAGNOSIS — I255 Ischemic cardiomyopathy: Secondary | ICD-10-CM | POA: Diagnosis not present

## 2020-01-11 DIAGNOSIS — I480 Paroxysmal atrial fibrillation: Secondary | ICD-10-CM

## 2020-01-11 NOTE — Patient Instructions (Signed)
Medication Instructions:   No changes   *If you need a refill on your cardiac medications before your next appointment, please call your pharmacy*   Lab Work: Not needed   Testing/Procedures: Not needed   Follow-Up: At [facility], you and your health needs are our priority.  As part of our continuing mission to provide you with exceptional heart care, we have created designated Provider Care Teams.  These Care Teams include your primary Cardiologist (physician) and Advanced Practice Providers (APPs -  Physician Assistants and Nurse Practitioners) who all work together to provide you with the care you need, when you need it.  We recommend signing up for the patient portal called "MyChart".  Sign up information is provided on this After Visit Summary.  MyChart is used to connect with patients for Virtual Visits (Telemedicine).  Patients are able to view lab/test results, encounter notes, upcoming appointments, etc.  Non-urgent messages can be sent to your provider as well.   To learn more about what you can do with MyChart, go to [email or web address].    Your next appointment:   12 month(s)  The format for your next appointment:   In Person  Provider:   David Harding, MD   Other Instructions  

## 2020-01-11 NOTE — Progress Notes (Addendum)
Primary Care Provider: Lajean Manes, MD Cardiologist: Glenetta Hew, MD Electrophysiologist: None  Clinic Note: Chief Complaint  Patient presents with  . Follow-up    Doing fairly well  . Coronary Artery Disease    No angina   HPI:    Paul Kramer is a 86 y.o. male with a PMH notable for inferior STEMI in June 2019 (found to have CTO of the RCA and underwent PCI of the LAD and LCx-OM2), CKD III (baseline creatinine 1.6-1.8) and prior TIA who presents today for ~58-month follow-up.  Cardiac History:  November 29, 2017:Inferolateral ST elevations in new onset A. fib.-->Converted to NSR on amiodarone. ? Cardiac cath: [facility] however they decided he did not want to undergo CABG.(RCA was CTO) -->complicated by large forearm hematoma post Cath &CIN. ? June 10 - staged PCI LAD & Cx. ? CAD PAF-no notable recurrence.  Per patient choice, only on Plavix ? Mildly reduced EF  Paul Kramer was last seen on May 02, 2019 with an inpatient interview.  He was fully protected wearing mask and face shield.  Noted to be a relatively poor historian, but no major issues.  Has been quite isolated because of pandemic scare.  Stopped going to the gym to exercise because of concerns.  Staying active around the house and doing at least 30 to 50 minutes of exercise.  Loss of balance led to a fall with knee injury.  No real anginal symptoms.  Well-controlled swelling, occasional use of support stockings.  Recent Hospitalizations: None  Reviewed  CV studies:    The following studies were reviewed today: (if available, images/films reviewed: From Epic Chart or Care Everywhere) . None:   Interval History:   Paul Kramer returns here today doing remarkably well.  He had gout in his foot a few weeks ago when had to take a break from walking and has had his knee in a brace with pending injection, but is still trying to walk with a walker with his friend at least three-quarter mile a day.  He tries to  swim laps, but there is been issues with this meaningful.  While he may get occasional exertional dyspnea if he overdoes it, it goes away once he sits down to relax.  Not with routine activity.  No chest pain or pressure.  Right now because of arthritis issues he is using a walker to walk. He has occasional skipped beats but no prolonged rapid irregular beats.  He has swelling that has gone down and seems to be controlled as long as he is careful about eating salt.  He does not like to wear support socks, especially in the summertime because his hot and the itch.  But he does wear them part of the time.  Cardiovascular Review of Symptoms (Summary): positive for - Exertional dyspnea if he does too much.  Stable edema. negative for - chest pain, irregular heartbeat, orthopnea, palpitations, paroxysmal nocturnal dyspnea, rapid heart rate, shortness of breath or Lightheadedness, dizziness, wooziness, syncope/near syncope, TIA/amaurosis fugax.  Claudication.  The patient does not have symptoms concerning for COVID-19 infection (fever, chills, cough, or new shortness of breath).  The patient is practicing social distancing & Masking.   Completed Moderna COVID-19 vaccine injections in January and February 2021  REVIEWED OF SYSTEMS   Review of Systems  Constitutional: Negative for malaise/fatigue and weight loss.  HENT: Positive for hearing loss. Negative for congestion and nosebleeds.   Respiratory: Negative for shortness of breath.   Gastrointestinal: Negative for blood  in stool and melena.  Genitourinary: Positive for frequency (Nocturia). Negative for hematuria.  Musculoskeletal: Positive for back pain and joint pain (Recent episode of gout; has foot and knee pain otherwise.). Negative for myalgias.  Neurological: Negative for dizziness and focal weakness.       He does have a somewhat unsteady gait and uses a walker or cane for most walking.  Psychiatric/Behavioral: Positive for memory loss.        Not a great historian   I have reviewed and (if needed) personally updated the patient's problem list, medications, allergies, past medical and surgical history, social and family history.   PAST MEDICAL HISTORY   Past Medical History:  Diagnosis Date  . Hypercholesteremia   . Hypertension   . Multiple vessel coronary artery disease     Cath 11/29/17 - 3 V CAD (CTO RCA), 95% m-d Cx (@ OM2 55%)-> PCI Cx-PTCA Om2; 85% p-m LAD => DES PCI   . Myocardial infarction of inferolateral wall ([place]) 11/2017   ? prior Infarct; - Cath 11/29/17 - 3 V CAD (CTO RCA), 95% m-d Cx (@ OM2 55%)-> PCI Cx-PTCA Om2; 85% p-m LAD => DES PCI   . Narcolepsy    pt may pass out when laughing  . Spinal stenosis   . TIA (transient ischemic attack)     PAST SURGICAL HISTORY   Past Surgical History:  Procedure Laterality Date  . CORONARY BALLOON ANGIOPLASTY N/A 11/29/2017   Procedure: CORONARY BALLOON ANGIOPLASTY;  Surgeon: Leonie Man, MD;  Location: [facility];  Service: Cardiovascular;; PTCA of ostOM2 55% -> 20%. (jailed)  . CORONARY STENT INTERVENTION N/A 12/07/2017   Procedure: CORONARY STENT INTERVENTION;  Surgeon: Leonie Man, MD;  Location: [place] [facility];  Service: Cardiovascular;  Laterality: N/A;  . LEFT HEART CATH N/A 12/07/2017   Procedure: Left Heart Cath;  Surgeon: Leonie Man, MD;  Location: [place] [facility];  Service: Cardiovascular;  Laterality: N/A;  . LEFT HEART CATH AND CORONARY ANGIOGRAPHY N/A 11/29/2017   Procedure: LEFT HEART CATH AND CORONARY ANGIOGRAPHY;  Surgeon: Leonie Man, MD;  Location: [facility];; mRCA CTO, p-m LAD 85%, m-dCx 95% @ OM2 55%. Mod High LVEDP   . LIPOMA EXCISION    . SPINAL FIXATION SURGERY    . TRANSTHORACIC ECHOCARDIOGRAM  11/30/2017   EF 45-50%.  Inferior hypokinesis.  Elevated filling pressures.  Mild to moderate MR.    Post PCI  g  MEDICATIONS/ALLERGIES   Current Meds  Medication Sig  . acetaminophen (TYLENOL) 325 MG  tablet Take 325 mg by mouth every 6 (six) hours as needed for mild pain.  Marland Kitchen allopurinol (ZYLOPRIM) 100 MG tablet Take 100 mg by mouth every morning.  Marland Kitchen amLODipine (NORVASC) 2.5 MG tablet Take 1 tablet by mouth at bedtime.  Marland Kitchen atorvastatin (LIPITOR) 40 MG tablet TAKE 1 TABLET DAILY AT 6 P.M.  . clopidogrel (PLAVIX) 75 MG tablet TAKE 1 TABLET DAILY  . cyanocobalamin 1000 MCG tablet Take 1 tablet (1,000 mcg total) by mouth daily.  . famotidine (PEPCID) 20 MG tablet Take 20 mg by mouth 2 (two) times daily.  . Multiple Vitamins-Minerals (CENTRUM SILVER PO) Take 1 tablet by mouth daily.  . nitroGLYCERIN (NITROSTAT) 0.4 MG SL tablet DISSOLVE ONE TABLET UNDER THE TONGUE EVERY 5 MINUTES AS NEEDED FOR CHEST PAIN.  DO NOT EXCEED A TOTAL OF 3 DOSES IN 15 MINUTES  . Psyllium (METAMUCIL FIBER PO) Take by mouth.  . terazosin (HYTRIN)  2 MG capsule Take 4 mg by mouth every evening.  Marland Kitchen VITAMIN D, ERGOCALCIFEROL, PO Take 5,000 Units by mouth daily.     Allergies  Allergen Reactions  . Other Shortness Of Breath    Esters  . Contrast Media [Iodinated Diagnostic Agents] Other (See Comments)    Just feel bad all over  . Morphine And Related Other (See Comments)    Patient states he does not like to take Morphine as it gives him nightmares and hallucinations.     SOCIAL HISTORY/FAMILY HISTORY   Reviewed in Epic:  Pertinent findings: Currently swimming laps usually 3 days a week up until the point that there was an issue with the pool and now he has only been able to walk with a walker.  He walks with a friend of his and they walk about three-quarter mile to 1 mile.  He is looking forward to going to [place] in the fall to go deep sea fishing with his son.  OBJCTIVE -PE, EKG, labs   Wt Readings from Last 3 Encounters:  01/11/20 192 lb 9.6 oz (87.4 kg)  05/02/19 191 lb (86.6 kg)  11/29/18 182 lb 4.8 oz (82.7 kg)    Physical Exam: BP 126/62   Pulse (!) 55   Ht 5' (1.524 m)   Wt 192 lb 9.6 oz (87.4  kg)   SpO2 96%   BMI 37.61 kg/m  Physical Exam Vitals reviewed.  Constitutional:      General: He is not in acute distress.    Appearance: Normal appearance. He is obese. He is not ill-appearing.  HENT:     Head: Normocephalic and atraumatic.     Comments: Still wearing mask and face shield Neck:     Vascular: No carotid bruit or JVD.  Cardiovascular:     Rate and Rhythm: Normal rate and regular rhythm.  No extrasystoles are present.    Chest Wall: PMI is not displaced.     Pulses: Normal pulses.     Heart sounds: S1 normal and S2 normal. Heart sounds are distant. Murmur (1/6 SEM at RUSB.) heard.  No friction rub. No gallop.   Pulmonary:     Effort: Pulmonary effort is normal. No respiratory distress.     Breath sounds: Normal breath sounds.     Comments: No W/R/R Chest:     Chest wall: No tenderness.  Musculoskeletal:        General: Swelling (1+ bilateral lower extremity edema) present.     Cervical back: Normal range of motion and neck supple.  Neurological:     General: No focal deficit present.     Mental Status: He is alert and oriented to person, place, and time.  Psychiatric:        Mood and Affect: Mood normal.        Behavior: Behavior normal.        Thought Content: Thought content normal.        Judgment: Judgment normal.     Adult ECG Report  Rate: 55 ;  Rhythm: sinus bradycardia, premature atrial contractions (PAC) and Normal axis, intervals and durations.;   Narrative Interpretation: Stable EKG  Recent Labs: Should be due to get labs drawn soon.  Most recent documented labs   03/02/2019: TC 153, TG 107, HDL 45, LDL 87.  K+ 4.4.  BUN/Cr 27/1.72, K+ 4.4  11/23/2019: Hgb 11.8; BUN/Cr 29/1.55; K+ 4.9 Lab Results  Component Value Date   CHOL 137 11/30/2017   HDL 38 (  L) 11/30/2017   LDLCALC 80 11/30/2017   TRIG 93 11/30/2017   CHOLHDL 3.6 11/30/2017   Lab Results  Component Value Date   CREATININE 1.93 (H) 12/12/2017   BUN 28 (H) 12/12/2017   NA 135  12/12/2017   K 4.2 12/12/2017   CL 107 12/12/2017   CO2 20 (L) 12/12/2017   Lab Results  Component Value Date   TSH 5.308 (H) 12/04/2017    ASSESSMENT/PLAN    Problem List Items Addressed This Visit    Acute ST elevation myocardial infarction (STEMI) (HCC) (Chronic)    I suspect that with CTO of the RCA, culprit lesion with probably his circumflex lesion.  Is now status post circumflex and LAD PCI.  Last EF evaluation was shortly after his MI and was mildly reduced, but no active angina or heart failure symptoms now.      PAF (paroxysmal atrial fibrillation) (HCC); CHA2DS2-VASc Score -7 (Chronic)    No further breakthrough episodes since his MI. He has had a TIA, but has voiced a desire to avoid anticoagulation beyond Plavix.  For now we will continue with Plavix as maintenance for both CAD and mild stroke dimension.  He understands that it is not as protective as NOAC.      Relevant Orders   EKG 12-Lead (Completed)   Coronary artery disease involving native coronary artery of native heart with angina pectoris ([place]) - Primary (Chronic)    No active anginal symptoms.  He does get little short of breath if he overdoes it, but no chest pain.  He has known occluded RCA and therefore likely had codominant circumflex.  Plan: Continue Plavix statin and amlodipine. -> No ACE-I/ARB because of renal insufficiency, and no beta-blocker because of bradycardia/fatigue      Essential hypertension (Chronic)    Stable blood pressure.  It usually is in this range at home.  He is only on very low dose of amlodipine. Currently on 2.5 mg.      Hyperlipidemia with target low density lipoprotein (LDL) cholesterol less than 70 mg/dL (Chronic)    Labs last checked in September of last year.  LDL was 87.  If still elevated on repeat check, would consider switching to rosuvastatin 40 mg.      CAD S/P percutaneous coronary angioplasty (Chronic)    PCI to both the LAD and LCx with known occlusion of  the RCA.  Could not tolerate in-stent thrombosis. No longer on aspirin. Continue maintenance Plavix--if there is recurrence of A. fib, would likely consider converting to Eliquis and stopping Plavix  --Okay to hold Plavix 5 to 7 days preop for surgeries/procedures      Relevant Orders   EKG 12-Lead (Completed)   Ischemic cardiomyopathy (Chronic)    Minimally reduced EF, I suspect that it probably a little better than that now.  He does have an occluded RCA.  No active angina or heart failure symptoms. Not on a beta-blocker because of baseline bradycardia.  Not on ACE inhibitor/ARB because of renal insufficiency.  Maintained blood pressure control on amlodipine.  Euvolemic, therefore no diuretic requirement.       Relevant Orders   EKG 12-Lead (Completed)      --> Overall, stable.  No medication changes warranted.  Continue to monitor.  If LDL not at goal with follow-up labs with PCP, would consider converting from atorvastatin to rosuvastatin 40 mg.  If he were to have any recurrence of A. fib, recommend converting to Eliquis and stopping Plavix.  COVID-19 Education: The signs and symptoms of COVID-19 were discussed with the patient and how to seek care for testing (follow up with PCP or arrange E-visit).   The importance of social distancing and COVID-19 vaccination was discussed today.  I spent a total of 23 minutes with the patient. >  50% of the time was spent in direct patient consultation.  Additional time spent with chart review  / charting (studies, outside notes, etc): 8 Total Time: 31 min   Current medicines are reviewed at length with the patient today.  (+/- concerns) none  Notice: This dictation was prepared with Dragon dictation along with smaller phrase technology. Any transcriptional errors that result from this process are unintentional and may not be corrected upon review.  Patient Instructions / Medication Changes & Studies & Tests Ordered   Patient  Instructions  Medication Instructions:  No changes  *If you need a refill on your cardiac medications before your next appointment, please call your pharmacy*   Lab Work: Not needed   Testing/Procedures: Not needed   Follow-Up: At [facility], you and your health needs are our priority.  As part of our continuing mission to provide you with exceptional heart care, we have created designated Provider Care Teams.  These Care Teams include your primary Cardiologist (physician) and Advanced Practice Providers (APPs -  Physician Assistants and Nurse Practitioners) who all work together to provide you with the care you need, when you need it.  We recommend signing up for the patient portal called "MyChart".  Sign up information is provided on this After Visit Summary.  MyChart is used to connect with patients for Virtual Visits (Telemedicine).  Patients are able to view lab/test results, encounter notes, upcoming appointments, etc.  Non-urgent messages can be sent to your provider as well.   To learn more about what you can do with MyChart, go to [email or web address].    Your next appointment:   12 month(s)  The format for your next appointment:   In Person  Provider:   Glenetta Hew, MD   Other Instructions     Studies Ordered:   Orders Placed This Encounter  Procedures  . EKG 12-Lead    Addendum: Labs from nephrology sent on the following day:  From [facility]-11/23/2019:   Glenetta Hew, M.D., M.S. Interventional Cardiologist   Pager # [phone] Phone # [phone] [address] [place], [place] [address]   Thank you for choosing Heartcare at [facility]!!

## 2020-01-14 ENCOUNTER — Encounter: Payer: Self-pay | Admitting: Cardiology

## 2020-01-14 NOTE — Assessment & Plan Note (Signed)
Stable blood pressure.  It usually is in this range at home.  He is only on very low dose of amlodipine. Currently on 2.5 mg.

## 2020-01-14 NOTE — Assessment & Plan Note (Signed)
No further breakthrough episodes since his MI. He has had a TIA, but has voiced a desire to avoid anticoagulation beyond Plavix.  For now we will continue with Plavix as maintenance for both CAD and mild stroke dimension.  He understands that it is not as protective as NOAC.

## 2020-01-14 NOTE — Assessment & Plan Note (Signed)
PCI to both the LAD and LCx with known occlusion of the RCA.  Could not tolerate in-stent thrombosis. No longer on aspirin. Continue maintenance Plavix--if there is recurrence of A. fib, would likely consider converting to Eliquis and stopping Plavix  --Okay to hold Plavix 5 to 7 days preop for surgeries/procedures

## 2020-01-14 NOTE — Assessment & Plan Note (Addendum)
Minimally reduced EF, I suspect that it probably a little better than that now.  He does have an occluded RCA.  No active angina or heart failure symptoms. Not on a beta-blocker because of baseline bradycardia.  Not on ACE inhibitor/ARB because of renal insufficiency.  Maintained blood pressure control on amlodipine.  Euvolemic, therefore no diuretic requirement.

## 2020-01-14 NOTE — Assessment & Plan Note (Signed)
I suspect that with CTO of the RCA, culprit lesion with probably his circumflex lesion.  Is now status post circumflex and LAD PCI.  Last EF evaluation was shortly after his MI and was mildly reduced, but no active angina or heart failure symptoms now.

## 2020-01-14 NOTE — Assessment & Plan Note (Signed)
Labs last checked in September of last year.  LDL was 87.  If still elevated on repeat check, would consider switching to rosuvastatin 40 mg.

## 2020-01-14 NOTE — Assessment & Plan Note (Signed)
No active anginal symptoms.  He does get little short of breath if he overdoes it, but no chest pain.  He has known occluded RCA and therefore likely had codominant circumflex.  Plan: Continue Plavix statin and amlodipine. -> No ACE-I/ARB because of renal insufficiency, and no beta-blocker because of bradycardia/fatigue

## 2020-02-27 DIAGNOSIS — N183 Chronic kidney disease, stage 3 unspecified: Secondary | ICD-10-CM | POA: Diagnosis not present

## 2020-03-07 DIAGNOSIS — N1832 Chronic kidney disease, stage 3b: Secondary | ICD-10-CM | POA: Diagnosis not present

## 2020-03-07 DIAGNOSIS — E7800 Pure hypercholesterolemia, unspecified: Secondary | ICD-10-CM | POA: Diagnosis not present

## 2020-03-07 DIAGNOSIS — I480 Paroxysmal atrial fibrillation: Secondary | ICD-10-CM | POA: Diagnosis not present

## 2020-03-07 DIAGNOSIS — I700 Atherosclerosis of aorta: Secondary | ICD-10-CM | POA: Diagnosis not present

## 2020-03-07 DIAGNOSIS — I270 Primary pulmonary hypertension: Secondary | ICD-10-CM | POA: Diagnosis not present

## 2020-03-07 DIAGNOSIS — Z0000 Encounter for general adult medical examination without abnormal findings: Secondary | ICD-10-CM | POA: Diagnosis not present

## 2020-03-07 DIAGNOSIS — I129 Hypertensive chronic kidney disease with stage 1 through stage 4 chronic kidney disease, or unspecified chronic kidney disease: Secondary | ICD-10-CM | POA: Diagnosis not present

## 2020-03-07 DIAGNOSIS — Z23 Encounter for immunization: Secondary | ICD-10-CM | POA: Diagnosis not present

## 2020-03-07 DIAGNOSIS — R739 Hyperglycemia, unspecified: Secondary | ICD-10-CM | POA: Diagnosis not present

## 2020-03-07 DIAGNOSIS — Z1389 Encounter for screening for other disorder: Secondary | ICD-10-CM | POA: Diagnosis not present

## 2020-03-07 DIAGNOSIS — Z79899 Other long term (current) drug therapy: Secondary | ICD-10-CM | POA: Diagnosis not present

## 2020-03-08 DIAGNOSIS — I129 Hypertensive chronic kidney disease with stage 1 through stage 4 chronic kidney disease, or unspecified chronic kidney disease: Secondary | ICD-10-CM | POA: Diagnosis not present

## 2020-03-08 DIAGNOSIS — D631 Anemia in chronic kidney disease: Secondary | ICD-10-CM | POA: Diagnosis not present

## 2020-03-08 DIAGNOSIS — N2581 Secondary hyperparathyroidism of renal origin: Secondary | ICD-10-CM | POA: Diagnosis not present

## 2020-03-08 DIAGNOSIS — N183 Chronic kidney disease, stage 3 unspecified: Secondary | ICD-10-CM | POA: Diagnosis not present

## 2020-04-21 DIAGNOSIS — Z23 Encounter for immunization: Secondary | ICD-10-CM | POA: Diagnosis not present

## 2020-04-22 ENCOUNTER — Other Ambulatory Visit: Payer: Self-pay | Admitting: Cardiology

## 2020-07-30 DIAGNOSIS — I129 Hypertensive chronic kidney disease with stage 1 through stage 4 chronic kidney disease, or unspecified chronic kidney disease: Secondary | ICD-10-CM | POA: Diagnosis not present

## 2020-07-30 DIAGNOSIS — D631 Anemia in chronic kidney disease: Secondary | ICD-10-CM | POA: Diagnosis not present

## 2020-07-30 DIAGNOSIS — N183 Chronic kidney disease, stage 3 unspecified: Secondary | ICD-10-CM | POA: Diagnosis not present

## 2020-07-30 DIAGNOSIS — N2581 Secondary hyperparathyroidism of renal origin: Secondary | ICD-10-CM | POA: Diagnosis not present

## 2020-07-30 DIAGNOSIS — N189 Chronic kidney disease, unspecified: Secondary | ICD-10-CM | POA: Diagnosis not present

## 2020-09-04 DIAGNOSIS — I480 Paroxysmal atrial fibrillation: Secondary | ICD-10-CM | POA: Diagnosis not present

## 2020-09-04 DIAGNOSIS — I270 Primary pulmonary hypertension: Secondary | ICD-10-CM | POA: Diagnosis not present

## 2020-09-04 DIAGNOSIS — F5101 Primary insomnia: Secondary | ICD-10-CM | POA: Diagnosis not present

## 2020-09-04 DIAGNOSIS — R7303 Prediabetes: Secondary | ICD-10-CM | POA: Diagnosis not present

## 2020-09-04 DIAGNOSIS — I700 Atherosclerosis of aorta: Secondary | ICD-10-CM | POA: Diagnosis not present

## 2020-09-04 DIAGNOSIS — N1832 Chronic kidney disease, stage 3b: Secondary | ICD-10-CM | POA: Diagnosis not present

## 2020-09-04 DIAGNOSIS — E7800 Pure hypercholesterolemia, unspecified: Secondary | ICD-10-CM | POA: Diagnosis not present

## 2020-09-04 DIAGNOSIS — I129 Hypertensive chronic kidney disease with stage 1 through stage 4 chronic kidney disease, or unspecified chronic kidney disease: Secondary | ICD-10-CM | POA: Diagnosis not present

## 2020-09-06 DIAGNOSIS — M5459 Other low back pain: Secondary | ICD-10-CM | POA: Diagnosis not present

## 2020-09-06 DIAGNOSIS — M1711 Unilateral primary osteoarthritis, right knee: Secondary | ICD-10-CM | POA: Diagnosis not present

## 2020-09-06 DIAGNOSIS — M170 Bilateral primary osteoarthritis of knee: Secondary | ICD-10-CM | POA: Diagnosis not present

## 2020-09-13 DIAGNOSIS — M1711 Unilateral primary osteoarthritis, right knee: Secondary | ICD-10-CM | POA: Diagnosis not present

## 2020-09-19 DIAGNOSIS — M1711 Unilateral primary osteoarthritis, right knee: Secondary | ICD-10-CM | POA: Diagnosis not present

## 2020-09-19 DIAGNOSIS — M545 Low back pain, unspecified: Secondary | ICD-10-CM | POA: Diagnosis not present

## 2020-09-26 DIAGNOSIS — M25561 Pain in right knee: Secondary | ICD-10-CM | POA: Diagnosis not present

## 2020-10-03 DIAGNOSIS — M25561 Pain in right knee: Secondary | ICD-10-CM | POA: Diagnosis not present

## 2020-10-05 DIAGNOSIS — M545 Low back pain, unspecified: Secondary | ICD-10-CM | POA: Diagnosis not present

## 2020-10-08 DIAGNOSIS — M545 Low back pain, unspecified: Secondary | ICD-10-CM | POA: Diagnosis not present

## 2020-10-11 DIAGNOSIS — M545 Low back pain, unspecified: Secondary | ICD-10-CM | POA: Diagnosis not present

## 2020-10-16 DIAGNOSIS — M545 Low back pain, unspecified: Secondary | ICD-10-CM | POA: Diagnosis not present

## 2020-10-18 DIAGNOSIS — M545 Low back pain, unspecified: Secondary | ICD-10-CM | POA: Diagnosis not present

## 2020-10-29 ENCOUNTER — Telehealth: Payer: Self-pay | Admitting: *Deleted

## 2020-10-29 NOTE — Telephone Encounter (Signed)
   Patient Name: Paul Kramer  DOB: 1929-09-15  MRN: [id number]   Primary Cardiologist: Glenetta Hew, MD  Chart reviewed as part of pre-operative protocol coverage.   Simple dental extractions are considered low risk procedures per guidelines and generally do not require any specific cardiac clearance. It is also generally accepted that for simple extractions and dental cleanings, there is no need to interrupt blood thinner therapy.  SBE prophylaxis is not required for the patient from a cardiac standpoint.  I will route this recommendation to the requesting party via Epic fax function and remove from pre-op pool.  Please call with questions.  [place], [place] 10/29/2020, 6:01 PM

## 2020-10-29 NOTE — Telephone Encounter (Signed)
   [facility] Pre-operative Risk Assessment    Patient Name: Paul Kramer  DOB: March 10, 1930  MRN: [id number]   HEARTCARE STAFF: - Please ensure there is not already an duplicate clearance open for this procedure. - Under Visit Info/Reason for Call, type in Other and utilize the format Clearance MM/DD/YY or Clearance TBD. Do not use dashes or single digits. - If request is for dental extraction, please clarify the # of teeth to be extracted.  Request for surgical clearance:  1. What type of surgery is being performed?  1 TOOTH EXTRACTION   2. When is this surgery scheduled? TBD     3. What type of clearance is required (medical clearance vs. Pharmacy clearance to hold med vs. Both)?  BOTH  4. Are there any medications that need to be held prior to surgery and how long? PLAVIX    5. Practice name and name of physician performing surgery?  [facility] / DR. Buelah Manis   6. What is the office phone number?  [phone]   7.   What is the office fax number?  [phone]  8.   Anesthesia type (None, local, MAC, general) ?  LOCAL   Jeanann Lewandowsky 10/29/2020, 4:51 PM  _________________________________________________________________   (provider comments below)

## 2020-10-30 DIAGNOSIS — Z23 Encounter for immunization: Secondary | ICD-10-CM | POA: Diagnosis not present

## 2020-10-31 ENCOUNTER — Other Ambulatory Visit: Payer: Self-pay

## 2020-10-31 ENCOUNTER — Ambulatory Visit (INDEPENDENT_AMBULATORY_CARE_PROVIDER_SITE_OTHER): Payer: Medicare Other | Admitting: Dermatology

## 2020-10-31 ENCOUNTER — Encounter: Payer: Self-pay | Admitting: Dermatology

## 2020-10-31 DIAGNOSIS — D485 Neoplasm of uncertain behavior of skin: Secondary | ICD-10-CM | POA: Diagnosis not present

## 2020-10-31 DIAGNOSIS — L821 Other seborrheic keratosis: Secondary | ICD-10-CM

## 2020-10-31 DIAGNOSIS — Z1283 Encounter for screening for malignant neoplasm of skin: Secondary | ICD-10-CM

## 2020-10-31 DIAGNOSIS — L859 Epidermal thickening, unspecified: Secondary | ICD-10-CM | POA: Diagnosis not present

## 2020-10-31 NOTE — Patient Instructions (Signed)

## 2020-11-01 DIAGNOSIS — M1711 Unilateral primary osteoarthritis, right knee: Secondary | ICD-10-CM | POA: Diagnosis not present

## 2020-11-08 ENCOUNTER — Telehealth: Payer: Self-pay | Admitting: Dermatology

## 2020-11-08 NOTE — Telephone Encounter (Signed)
Left msg to call next week not back

## 2020-11-08 NOTE — Telephone Encounter (Signed)
Patient is calling for pathology results from last visit with Stuart Tafeen, MD 

## 2020-11-15 ENCOUNTER — Encounter: Payer: Self-pay | Admitting: Dermatology

## 2020-11-15 NOTE — Progress Notes (Signed)
   Follow-Up Visit   Subjective  Paul Kramer is a 87 y.o. male who presents for the following: Annual Exam (Back  x unknown- per daughter its black, scalp- it scaly, lesion on left side of neck- won't heal, chest x 2 spots come and go).  Several new growths in general skin check. Location:  Duration:  Quality:  Associated Signs/Symptoms: Modifying Factors:  Severity:  Timing: Context:   Objective  Well appearing patient in no apparent distress; mood and affect are within normal limits. Objective  Left post neck.: Pearly pink 7 mm papule, rule out BCC     Objective  Left Lower Back: Pink waxy 8 mm crust, SCCA versus I SK     Objective  Mid Back: Full body skin exam: No atypical pigmented lesions.  Multiple clinically typical, noninflamed 4 to 15mm textured brown papules; dermoscopy confirms seborrheic keratoses which do not require intervention.   A full examination was performed including scalp, head, eyes, ears, nose, lips, neck, chest, axillae, abdomen, back, buttocks, bilateral upper extremities, bilateral lower extremities, hands, feet, fingers, toes, fingernails, and toenails. All findings within normal limits unless otherwise noted below.  Area beneath underwear not fully examined.   Assessment & Plan    Neoplasm of uncertain behavior of skin (2) Left post neck.  Skin / nail biopsy Type of biopsy: tangential   Informed consent: discussed and consent obtained   Timeout: patient name, date of birth, surgical site, and procedure verified   Anesthesia: the lesion was anesthetized in a standard fashion   Anesthetic:  1% lidocaine w/ epinephrine 1-100,000 local infiltration Instrument used: flexible razor blade   Hemostasis achieved with: ferric subsulfate   Outcome: patient tolerated procedure well   Post-procedure details: sterile dressing applied and wound care instructions given   Dressing type: bandage and petrolatum    Specimen 1 - Surgical  pathology Differential Diagnosis: scc vs bcc  Check Margins: No  Left Lower Back  Skin / nail biopsy Type of biopsy: tangential   Informed consent: discussed and consent obtained   Timeout: patient name, date of birth, surgical site, and procedure verified   Anesthesia: the lesion was anesthetized in a standard fashion   Anesthetic:  1% lidocaine w/ epinephrine 1-100,000 local infiltration Instrument used: flexible razor blade   Hemostasis achieved with: ferric subsulfate   Outcome: patient tolerated procedure well   Post-procedure details: sterile dressing applied and wound care instructions given   Dressing type: bandage and petrolatum    Specimen 2 - Surgical pathology Differential Diagnosis: atypia  Check Margins: No  Screening for malignant neoplasm of skin Mid Back  Yearly skin exam  Skin cancer screening performed today.    I, Lavonna Monarch, MD, have reviewed all documentation for this visit.  The documentation on 11/15/20 for the exam, diagnosis, procedures, and orders are all accurate and complete.

## 2020-11-19 ENCOUNTER — Other Ambulatory Visit: Payer: Self-pay | Admitting: Cardiology

## 2020-12-14 ENCOUNTER — Other Ambulatory Visit: Payer: Self-pay

## 2020-12-14 ENCOUNTER — Ambulatory Visit
Admission: EM | Admit: 2020-12-14 | Discharge: 2020-12-14 | Disposition: A | Discharge status: Home / Self Care | Payer: Medicare Other | Attending: Student | Admitting: Student

## 2020-12-14 DIAGNOSIS — I10 Essential (primary) hypertension: Secondary | ICD-10-CM | POA: Diagnosis not present

## 2020-12-14 DIAGNOSIS — H60331 Swimmer's ear, right ear: Secondary | ICD-10-CM | POA: Diagnosis not present

## 2020-12-14 DIAGNOSIS — H9193 Unspecified hearing loss, bilateral: Secondary | ICD-10-CM

## 2020-12-14 MED ORDER — AMOXICILLIN-POT CLAVULANATE 875-125 MG PO TABS: 1.0000 | ORAL_TABLET | Freq: Two times a day (BID) | ORAL | 0 refills | Status: DC

## 2020-12-14 MED ORDER — CIPROFLOXACIN-DEXAMETHASONE 0.3-0.1 % OT SUSP: 4.0000 [drp] | Freq: Two times a day (BID) | OTIC | 0 refills | Status: AC

## 2020-12-14 NOTE — ED Triage Notes (Signed)
Pt states had a hearing test on Monday. Wednesday had dental work done to rt lower teeth. States having rt ear pain radiating down jaw to his lower tooth since yesterday.

## 2020-12-14 NOTE — ED Provider Notes (Signed)
[facility]    CSN: [id number] Arrival date & time: 12/14/20  1541      History   Chief Complaint Chief Complaint  Patient presents with   Otalgia    HPI Paul Kramer is a 87 y.o. male presenting with R ear pain.  States that he had a hearing test 5 days ago, so his ears were cleaned before that.  He then had dental work done on his lower front incisors 2 days ago.  States that he started experiencing right ear pain radiating down his jaw on 1 day ago.  States he has tried to flush out his ears at home to alleviate the symptoms but this has not been helpful.  He has hearing loss at baseline, states that this is worse in the right ear today.  Denies fever/chills, recent URI.  Denies chest pain, dizziness, shortness of breath, weakness, headaches.  HPI  Past Medical History:  Diagnosis Date   Hypercholesteremia    Hypertension    Multiple vessel coronary artery disease     Cath 11/29/17 - 3 V CAD (CTO RCA), 95% m-d Cx (@ OM2 55%)-> PCI Cx-PTCA Om2; 85% p-m LAD => DES PCI    Myocardial infarction of inferolateral wall ([place]) 11/2017   ? prior Infarct; - Cath 11/29/17 - 3 V CAD (CTO RCA), 95% m-d Cx (@ OM2 55%)-> PCI Cx-PTCA Om2; 85% p-m LAD => DES PCI    Narcolepsy    pt may pass out when laughing   Spinal stenosis    TIA (transient ischemic attack)     Patient Active Problem List   Diagnosis Date Noted   Coronary artery disease involving native coronary artery of native heart with angina pectoris ([place]) 06/01/2018   Preoperative cardiovascular examination 06/01/2018   History of stroke 12/18/2017   CAD S/P percutaneous coronary angioplasty 12/16/2017   Ischemic cardiomyopathy 12/16/2017   Anemia 12/16/2017   UTI (urinary tract infection) 12/16/2017   Urinary retention 12/16/2017   BPH (benign prostatic hyperplasia) 12/16/2017   Pressure injury of skin 12/06/2017   AKI (acute kidney injury) ([place])    Elevated PSA    Status post coronary artery stent placement     Macrocytosis    Acute ST elevation myocardial infarction (STEMI) ([place]) 11/29/2017   Essential hypertension 11/29/2017   Hyperlipidemia with target low density lipoprotein (LDL) cholesterol less than 70 mg/dL 11/29/2017   PAF (paroxysmal atrial fibrillation) (H[facility]); CHA2DS2-VASc Score -7 11/29/2017   CKD (chronic kidney disease) stage 3, GFR 30-59 ml/min ([place]) 11/29/2017    Past Surgical History:  Procedure Laterality Date   CORONARY BALLOON ANGIOPLASTY N/A 11/29/2017   Procedure: CORONARY BALLOON ANGIOPLASTY;  Surgeon: Leonie Man, MD;  Location: [facility];  Service: Cardiovascular;; PTCA of ostOM2 55% -> 20%. (jailed)   CORONARY STENT INTERVENTION N/A 12/07/2017   Procedure: CORONARY STENT INTERVENTION;  Surgeon: Leonie Man, MD;  Location: [facility];  Service: Cardiovascular;  Laterality: N/A;   LEFT HEART CATH N/A 12/07/2017   Procedure: Left Heart Cath;  Surgeon: Leonie Man, MD;  Location: [facility];  Service: Cardiovascular;  Laterality: N/A;   LEFT HEART CATH AND CORONARY ANGIOGRAPHY N/A 11/29/2017   Procedure: LEFT HEART CATH AND CORONARY ANGIOGRAPHY;  Surgeon: Leonie Man, MD;  Location: [facility];; mRCA CTO, p-m LAD 85%, m-dCx 95% @ OM2 55%. Mod High LVEDP    LIPOMA EXCISION     SPINAL FIXATION SURGERY  TRANSTHORACIC ECHOCARDIOGRAM  11/30/2017   EF 45-50%.  Inferior hypokinesis.  Elevated filling pressures.  Mild to moderate MR.        Home Medications    Prior to Admission medications   Medication Sig Start Date End Date Taking? Authorizing Provider  amoxicillin-clavulanate (AUGMENTIN) 875-125 MG tablet Take 1 tablet by mouth every 12 (twelve) hours. 12/14/20  Yes Hazel Sams, PA-C  ciprofloxacin-dexamethasone (CIPRODEX) OTIC suspension Place 4 drops into the right ear 2 (two) times daily for 7 days. 12/14/20 12/21/20 Yes Hazel Sams, PA-C  acetaminophen (TYLENOL) 325 MG tablet Take 325 mg by mouth every 6 (six)  hours as needed for mild pain.    [provider]  allopurinol (ZYLOPRIM) 100 MG tablet Take 100 mg by mouth every morning. 10/08/17   [provider]  amLODipine (NORVASC) 2.5 MG tablet Take 1 tablet by mouth at bedtime. 10/18/18   [provider]  atorvastatin (LIPITOR) 40 MG tablet TAKE 1 TABLET DAILY AT 6 P.M. 11/19/20   Leonie Man, MD  clopidogrel (PLAVIX) 75 MG tablet TAKE 1 TABLET DAILY 04/23/20   Leonie Man, MD  cyanocobalamin 1000 MCG tablet Take 1 tablet (1,000 mcg total) by mouth daily. 12/11/17   Bhagat, Crista Luria, PA  famotidine (PEPCID) 20 MG tablet Take 20 mg by mouth 2 (two) times daily.    [provider]  Multiple Vitamins-Minerals (CENTRUM SILVER PO) Take 1 tablet by mouth daily.    [provider]  nitroGLYCERIN (NITROSTAT) 0.4 MG SL tablet DISSOLVE ONE TABLET UNDER THE TONGUE EVERY 5 MINUTES AS NEEDED FOR CHEST PAIN.  DO NOT EXCEED A TOTAL OF 3 DOSES IN 15 MINUTES 08/09/19   Bhagat, Bhavinkumar, PA  Psyllium (METAMUCIL FIBER PO) Take by mouth.    [provider]  terazosin (HYTRIN) 2 MG capsule Take 4 mg by mouth every evening. 10/08/17   [provider]  VITAMIN D, ERGOCALCIFEROL, PO Take 5,000 Units by mouth daily.     [provider]    Family History Family History  Family history unknown: Yes    Social History Social History   Tobacco Use   Smoking status: Former    Pack years: 0.00   Smokeless tobacco: Never  Substance Use Topics   Alcohol use: Yes    Comment: rare   Drug use: Never     Allergies   Other, Contrast media [iodinated diagnostic agents], and Morphine and related   Review of Systems Review of Systems  HENT:  Positive for ear pain and hearing loss.   All other systems reviewed and are negative.   Physical Exam Triage Vital Signs ED Triage Vitals  Enc Vitals Group     BP 12/14/20 1712 (!) 182/77     Pulse Rate 12/14/20 1712 81     Resp 12/14/20 1712 18      Temp 12/14/20 1712 97.9 F (36.6 C)     Temp Source 12/14/20 1712 Oral     SpO2 12/14/20 1712 96 %     Weight --      Height --      Head Circumference --      Peak Flow --      Pain Score 12/14/20 1715 10     Pain Loc --      Pain Edu? --      Excl. in [place]? --    No data found.  Updated Vital Signs BP (!) 182/77 (BP Location: Left Arm)  Pulse 81   Temp 97.9 F (36.6 C) (Oral)   Resp 18   SpO2 96%   Visual Acuity Right Eye Distance:   Left Eye Distance:   Bilateral Distance:    Right Eye Near:   Left Eye Near:    Bilateral Near:     Physical Exam Vitals reviewed.  Constitutional:      General: He is not in acute distress.    Appearance: Normal appearance. He is not ill-appearing, toxic-appearing or diaphoretic.  HENT:     Head: Normocephalic and atraumatic.     Jaw: There is normal jaw occlusion. No trismus, tenderness, swelling, pain on movement or malocclusion.     Salivary Glands: Right salivary gland is not diffusely enlarged or tender. Left salivary gland is not diffusely enlarged or tender.     Right Ear: Swelling and tenderness present. There is no impacted cerumen.     Left Ear: Hearing, tympanic membrane, ear canal and external ear normal. No swelling or tenderness. There is no impacted cerumen. No mastoid tenderness. Tympanic membrane is not perforated, erythematous, retracted or bulging.     Ears:     Comments: R external auditory canal with significant swelling and tenderness.  Unable to visualize tympanic membrane.  No obvious discharge.  Preauricular tenderness.    Nose: Nose normal.     Right Sinus: No maxillary sinus tenderness or frontal sinus tenderness.     Left Sinus: No maxillary sinus tenderness or frontal sinus tenderness.     Mouth/Throat:     Lips: Pink.     Mouth: Mucous membranes are moist. No lacerations or oral lesions.     Dentition: Normal dentition. Does not have dentures. No dental tenderness or dental caries.     Tongue: No  lesions. Tongue does not deviate from midline.     Palate: No mass.     Pharynx: Oropharynx is clear. Uvula midline. No oropharyngeal exudate or posterior oropharyngeal erythema.     Tonsils: No tonsillar exudate or tonsillar abscesses.     Comments: No dental abscess Eyes:     Extraocular Movements: Extraocular movements intact.     Pupils: Pupils are equal, round, and reactive to light.  Cardiovascular:     Rate and Rhythm: Normal rate and regular rhythm.     Heart sounds: Normal heart sounds.  Pulmonary:     Effort: Pulmonary effort is normal.     Breath sounds: Normal breath sounds and air entry. No wheezing, rhonchi or rales.  Chest:     Chest wall: No tenderness.  Abdominal:     General: Abdomen is flat. Bowel sounds are normal.     Tenderness: There is no abdominal tenderness. There is no guarding or rebound.  Lymphadenopathy:     Cervical: No cervical adenopathy.  Neurological:     General: No focal deficit present.     Mental Status: He is alert and oriented to person, place, and time.  Psychiatric:        Attention and Perception: Attention and perception normal.        Mood and Affect: Mood and affect normal.        Behavior: Behavior normal. Behavior is cooperative.        Thought Content: Thought content normal.        Judgment: Judgment normal.     UC Treatments / Results  Labs (all labs ordered are listed, but only abnormal results are displayed) Labs Reviewed - No data to display  EKG  Radiology No results found.  Procedures Procedures (including critical care time)  Medications Ordered in UC Medications - No data to display  Initial Impression / Assessment and Plan / UC Course  I have reviewed the triage vital signs and the nursing notes.  Pertinent labs & imaging results that were available during my care of the patient were reviewed by me and considered in my medical decision making (see chart for details).      This patient is an  87 year old male presenting with right external otitis following having his ears cleaned about 5 days ago.  Given significant swelling and pain, will treat with Ciprodex and Augmentin as below. Clean dry ear precautions  Patient is very hard of hearing at baseline.  ED return precautions discussed. Patient verbalizes understanding and agreement.   Final Clinical Impressions(s) / UC Diagnoses   Final diagnoses:  Essential hypertension  Acute swimmer's ear of right side  Bilateral hearing loss, unspecified hearing loss type     Discharge Instructions      -Start the antibiotic-Augmentin (amoxicillin-clavulanate), 1 pill every 12 hours for 7 days.  You can take this with food like with breakfast and dinner. -Ciprodex drops, 4 drops in the right ear 2x daily for 7 days -Keep your ear dry. Use an ear plug while showering -Please check your blood pressure at home or at the pharmacy. If this continues to be >140/90, follow-up with your primary care provider for further blood pressure management/ medication titration. If you develop chest pain, shortness of breath, vision changes, the worst headache of your life- head straight to the ED or call 911. -Follow-up with your primary care provider or seek additional immediate medical attention if symptoms worsen or persist despite treatment, or new symptoms like fever/chills.     ED Prescriptions     Medication Sig Dispense Auth. Provider   ciprofloxacin-dexamethasone (CIPRODEX) OTIC suspension Place 4 drops into the right ear 2 (two) times daily for 7 days. 7.5 mL Hazel Sams, PA-C   amoxicillin-clavulanate (AUGMENTIN) 875-125 MG tablet Take 1 tablet by mouth every 12 (twelve) hours. 14 tablet Hazel Sams, PA-C      PDMP not reviewed this encounter.   Hazel Sams, PA-C 12/14/20 1751

## 2020-12-14 NOTE — Discharge Instructions (Addendum)
-  Start the antibiotic-Augmentin (amoxicillin-clavulanate), 1 pill every 12 hours for 7 days.  You can take this with food like with breakfast and dinner. -Ciprodex drops, 4 drops in the right ear 2x daily for 7 days -Keep your ear dry. Use an ear plug while showering -Please check your blood pressure at home or at the pharmacy. If this continues to be >140/90, follow-up with your primary care provider for further blood pressure management/ medication titration. If you develop chest pain, shortness of breath, vision changes, the worst headache of your life- head straight to the ED or call 911. -Follow-up with your primary care provider or seek additional immediate medical attention if symptoms worsen or persist despite treatment, or new symptoms like fever/chills.

## 2020-12-16 DIAGNOSIS — H6011 Cellulitis of right external ear: Secondary | ICD-10-CM | POA: Diagnosis not present

## 2020-12-16 DIAGNOSIS — H60311 Diffuse otitis externa, right ear: Secondary | ICD-10-CM | POA: Diagnosis not present

## 2020-12-24 DIAGNOSIS — B379 Candidiasis, unspecified: Secondary | ICD-10-CM | POA: Diagnosis not present

## 2021-01-04 DIAGNOSIS — H66004 Acute suppurative otitis media without spontaneous rupture of ear drum, recurrent, right ear: Secondary | ICD-10-CM | POA: Diagnosis not present

## 2021-01-08 DIAGNOSIS — H2513 Age-related nuclear cataract, bilateral: Secondary | ICD-10-CM | POA: Diagnosis not present

## 2021-01-08 DIAGNOSIS — H524 Presbyopia: Secondary | ICD-10-CM | POA: Diagnosis not present

## 2021-01-08 DIAGNOSIS — H52203 Unspecified astigmatism, bilateral: Secondary | ICD-10-CM | POA: Diagnosis not present

## 2021-01-18 DIAGNOSIS — H2513 Age-related nuclear cataract, bilateral: Secondary | ICD-10-CM | POA: Diagnosis not present

## 2021-01-22 DIAGNOSIS — H25811 Combined forms of age-related cataract, right eye: Secondary | ICD-10-CM | POA: Diagnosis not present

## 2021-01-22 DIAGNOSIS — H2511 Age-related nuclear cataract, right eye: Secondary | ICD-10-CM | POA: Diagnosis not present

## 2021-01-28 DIAGNOSIS — Z8669 Personal history of other diseases of the nervous system and sense organs: Secondary | ICD-10-CM | POA: Diagnosis not present

## 2021-01-28 DIAGNOSIS — Z974 Presence of external hearing-aid: Secondary | ICD-10-CM | POA: Diagnosis not present

## 2021-01-28 DIAGNOSIS — H938X1 Other specified disorders of right ear: Secondary | ICD-10-CM | POA: Diagnosis not present

## 2021-02-19 DIAGNOSIS — H25812 Combined forms of age-related cataract, left eye: Secondary | ICD-10-CM | POA: Diagnosis not present

## 2021-02-19 DIAGNOSIS — H2512 Age-related nuclear cataract, left eye: Secondary | ICD-10-CM | POA: Diagnosis not present

## 2021-02-19 DIAGNOSIS — H52202 Unspecified astigmatism, left eye: Secondary | ICD-10-CM | POA: Diagnosis not present

## 2021-02-21 ENCOUNTER — Ambulatory Visit (INDEPENDENT_AMBULATORY_CARE_PROVIDER_SITE_OTHER): Payer: Medicare Other | Admitting: Cardiology

## 2021-02-21 ENCOUNTER — Encounter: Payer: Self-pay | Admitting: Cardiology

## 2021-02-21 ENCOUNTER — Other Ambulatory Visit: Payer: Self-pay

## 2021-02-21 VITALS — BP 147/73 | HR 68 | Ht 60.0 in | Wt 191.6 lb

## 2021-02-21 DIAGNOSIS — I2510 Atherosclerotic heart disease of native coronary artery without angina pectoris: Secondary | ICD-10-CM

## 2021-02-21 DIAGNOSIS — Z9861 Coronary angioplasty status: Secondary | ICD-10-CM

## 2021-02-21 DIAGNOSIS — E785 Hyperlipidemia, unspecified: Secondary | ICD-10-CM

## 2021-02-21 DIAGNOSIS — I480 Paroxysmal atrial fibrillation: Secondary | ICD-10-CM | POA: Diagnosis not present

## 2021-02-21 DIAGNOSIS — I2119 ST elevation (STEMI) myocardial infarction involving other coronary artery of inferior wall: Secondary | ICD-10-CM | POA: Diagnosis not present

## 2021-02-21 DIAGNOSIS — I25119 Atherosclerotic heart disease of native coronary artery with unspecified angina pectoris: Secondary | ICD-10-CM | POA: Diagnosis not present

## 2021-02-21 DIAGNOSIS — I10 Essential (primary) hypertension: Secondary | ICD-10-CM | POA: Diagnosis not present

## 2021-02-21 DIAGNOSIS — Z955 Presence of coronary angioplasty implant and graft: Secondary | ICD-10-CM

## 2021-02-21 DIAGNOSIS — I255 Ischemic cardiomyopathy: Secondary | ICD-10-CM

## 2021-02-21 NOTE — Patient Instructions (Signed)
Medication Instructions:  No changes  *If you need a refill on your cardiac medications before your next appointment, please call your pharmacy*   Lab Work: Not needed If you have labs (blood work) drawn today and your tests are completely normal, you will receive your results only by: . MyChart Message (if you have MyChart) OR . A paper copy in the mail If you have any lab test that is abnormal or we need to change your treatment, we will call you to review the results.   Testing/Procedures:  Not needed  Follow-Up: At [facility], you and your health needs are our priority.  As part of our continuing mission to provide you with exceptional heart care, we have created designated Provider Care Teams.  These Care Teams include your primary Cardiologist (physician) and Advanced Practice Providers (APPs -  Physician Assistants and Nurse Practitioners) who all work together to provide you with the care you need, when you need it.   Your next appointment:   12 month(s)  The format for your next appointment:   In Person  Provider:   David Harding, MD  

## 2021-02-21 NOTE — Progress Notes (Signed)
Primary Care Provider: Lajean Manes, MD -> scheduled to see a new PCP because Dr. Felipa Eth is retiring. Cardiologist: Glenetta Hew, MD Electrophysiologist: None  Clinic Note: Chief Complaint  Patient presents with   Follow-up    Annual.  Doing well.  No changes.   Coronary Artery Disease    No anginal symptoms.  Only notes exertional dyspnea with significant exertion.   Atrial Fibrillation    No recurrent episodes of A. fib since MI.   ===================================  ASSESSMENT/PLAN   Problem List Items Addressed This Visit       Cardiology Problems   STEMI (ST elevation myocardial infarction) ([place]) (Chronic)    He is now 3 years out from his MI.  He was found to have a CTO of the RCA -> he had then underwent staged PCI to the LAD and LCx.  Has not had any further anginal symptoms since. Mildly reduced EF but not unexpectedly inferior HK.  No heart failure.      Relevant Orders   EKG 12-Lead (Completed)   PAF (paroxysmal atrial fibrillation) (HCC); CHA2DS2-VASc Score -7 (Chronic)    As far as I can tell, he has not had any issues since his MI.  Based on previous discussions, per his wishes, he does not want to be on full oral anticoagulation.  He is happy on Plavix alone.  He does understand that this is not as protective as a DOAC, but is willing to take the risk.      Relevant Orders   EKG 12-Lead (Completed)   Coronary artery disease involving native coronary artery of native heart with angina pectoris ([place]) - Primary (Chronic)    Thankfully, no recurrent anginal symptoms.  LAD and circumflex were both treated with stents.  RCA is occluded with left-to-right collaterals.  As indicated above, not on beta-blocker because of bradycardia, not on ACE inhibitor/ARB due to concerns of renal insufficiency.  On low-dose amlodipine for antianginal benefit along with atorvastatin and standing dose Plavix.  No changes for now.      Relevant Orders   EKG 12-Lead  (Completed)   Essential hypertension (Chronic)    Blood pressure is a little bit high today, but better at home.  He is on very low-dose amlodipine.  No changes.      Hyperlipidemia with target low density lipoprotein (LDL) cholesterol less than 70 mg/dL (Chronic)    Labs as of September 2021 were not quite at goal, but better than the previous visit.  He is on 40 mg atorvastatin.  We will continue current dose.      CAD S/P percutaneous coronary angioplasty (Chronic)    With PCI to the LAD and LCx with occluded RCA, will continue to monitor therapy with Plavix.  He is on Plavix for CAD but also for potential benefit/stroke prevention with prior A. fib.  If he were to have any recurrence of A. fib, we can consider stopping Plavix and going to rest.  Okay to hold Plavix 5-7 days preop for surgeries or procedures.      Relevant Orders   EKG 12-Lead (Completed)   Ischemic cardiomyopathy (Chronic)    Minimally reduced EF likely related to the RCA.  Not requiring any diuretic.  Euvolemic on exam Not on beta-blocker because of bradycardia, and not on ACE/ARB because of underlying renal sufficiency.       ===================================  HPI:    Paul Kramer is a 87 y.o. male with a Cardiac History below who presents today  for annual follow-up.  Cardiac History:  November 29, 2017: Inferolateral ST elevation MI & new onset A. fib.  --> Converted to NSR on amiodarone. June 2 - Cardiac cath: MV CAD however they decided he did not want to undergo CABG. (RCA was CTO)  --> complicated by large forearm hematoma post Cath & CIN. June 10 - staged PCI LAD & Cx. CAD PAF-no notable recurrence.  Per patient choice, only on Plavix Mildly reduced EF 45-50%.  Inferior HK C/W known RCA occlusion.  Paul Kramer was last seen on January 11, 2020 -> doing remarkably well.  Had a recent issue with gout pain.  Significant knee pain as well.  Otherwise, has been walking at least three quarters of a mile a  day using his walker.  Also swimming laps.  Noted exertional dyspnea only if he overdoes it.  Goes away as soon as he relaxes.  Uses a walker to walk.  Only noted occasional skipped beats but nothing prolonged.  Gets swelling if he eats salty foods.  Recent Hospitalizations:  12/14/2020: Urgent care visit for right-sided swimmer's ear  Reviewed  CV studies:    The following studies were reviewed today: (if available, images/films reviewed: From Epic Chart or Care Everywhere) None   Interval History:   Paul Kramer returns again overall doing very well.  He is still very active, trying to swim although he has not recently because of his swimmer's ear.  He tries to walk about 1/2 - 3/4 of a mile most days using his walker, and also tries to swim may be a half a mile.  He will get short of breath if he overdoes it, but not with routine activity.  If he does strenuous activity such as carrying heavy groceries, he may also get short of breath.  He says he has some left-sided upper chest discomfort involving his left shoulder every now and then that really has no rhyme or reason as to when it occurs.  Not necessarily exertional.  He denies any PND or orthopnea but does have swelling which gets worse if he eats salty foods.  Very rare palpitations.  Nothing prolonged.  He tells me that his blood pressures at home are usually in the 120/70 range, stating that today's pressure is pretty high.  CV Review of Symptoms (Summary) Cardiovascular ROS: positive for - dyspnea on exertion, edema, irregular heartbeat, palpitations, and as noted above negative for - chest pain, orthopnea, paroxysmal nocturnal dyspnea, rapid heart rate, shortness of breath, or lightheadedness or dizziness, syncope/near syncope or TIA/amaurosis fugax, claudication  REVIEWED OF SYSTEMS   Review of Systems  Constitutional:  Negative for malaise/fatigue (Remarkably good energy level.  He does take some daytime naps.) and weight  loss.  HENT:  Negative for congestion and nosebleeds.   Eyes:        He recently had bilateral cataract surgeries.  Respiratory:  Positive for shortness of breath (Only with overdoing it). Negative for cough and wheezing.   Cardiovascular:  Positive for leg swelling (Worse when he eats salty foods).  Gastrointestinal:  Negative for abdominal pain, blood in stool and melena.  Genitourinary:  Positive for frequency (Frequent nocturia). Negative for dysuria, hematuria and urgency.  Musculoskeletal:  Positive for back pain (Chronic lumbar back pain) and joint pain (Bilateral knees but right worse). Negative for falls and myalgias.  Neurological:  Positive for dizziness (Sometimes if he stands up too quickly) and weakness (Legs can sometimes give out on him.). Negative for focal weakness.  Unsteady gait  Psychiatric/Behavioral:  Positive for memory loss. The patient has insomnia (Mostly because of nocturia). The patient is not nervous/anxious.     I have reviewed and (if needed) personally updated the patient's problem list, medications, allergies, past medical and surgical history, social and family history.   PAST MEDICAL HISTORY   Past Medical History:  Diagnosis Date   First detected episode of atrial fibrillation ([place]) 11/29/2017   In setting of STEMI -- NO further documented episodes   Hypercholesteremia    Hypertension    Multiple vessel coronary artery disease     Cath 11/29/17 - 3 V CAD (CTO RCA), 95% m-d Cx (@ OM2 55%)-> PCI Cx-PTCA Om2; 85% p-m LAD => DES PCI    Myocardial infarction of inferolateral wall ([place]) 11/2017   ? prior Infarct; - Cath 11/29/17 - 3 V CAD (CTO RCA), 95% m-d Cx (@ OM2 55%)-> PCI Cx-PTCA Om2; 85% p-m LAD => DES PCI    Narcolepsy    pt may pass out when laughing   Spinal stenosis    TIA (transient ischemic attack)     PAST SURGICAL HISTORY   Past Surgical History:  Procedure Laterality Date   CORONARY BALLOON ANGIOPLASTY N/A 11/29/2017   Procedure:  CORONARY BALLOON ANGIOPLASTY;  Surgeon: Leonie Man, MD;  Location: [facility];  Service: Cardiovascular;;  - PTCA of RCA - unable to cross -- LIKELY CTO.   CORONARY STENT INTERVENTION N/A 12/07/2017   Procedure: CORONARY STENT INTERVENTION;  Surgeon: Leonie Man, MD;  Location: [facility];  Service: Cardiovascular; Mid-dist LCx 95% with 55% OM2 => Scoring PTCA of OM2 (20%), LCx PCI - Synergy DES 3 x 16 (3.3 mm); prox LAD 85% PCI- Synergy DES 3 x 16 (3.6 mm).   LEFT HEART CATH N/A 12/07/2017   Procedure: Left Heart Cath;  Surgeon: Leonie Man, MD;  Location: [facility];  Service: Cardiovascular;  Laterality: N/A;   LEFT HEART CATH AND CORONARY ANGIOGRAPHY N/A 11/29/2017   Procedure: LEFT HEART CATH AND CORONARY ANGIOGRAPHY;  Surgeon: Leonie Man, MD;  Location: [facility];; mRCA CTO, p-m LAD 85%, m-dCx 95% @ OM2 55%. Mod High LVEDP    LIPOMA EXCISION     SPINAL FIXATION SURGERY     TRANSTHORACIC ECHOCARDIOGRAM  11/30/2017   EF 45-50%.  Inferior hypokinesis.  Elevated filling pressures.  Mild to moderate MR.    Post PCI diagram:    There is no immunization history on file for this patient.  MEDICATIONS/ALLERGIES   Current Meds  Medication Sig   acetaminophen (TYLENOL) 325 MG tablet Take 325 mg by mouth every 6 (six) hours as needed for mild pain.   allopurinol (ZYLOPRIM) 100 MG tablet Take 100 mg by mouth every morning.   amLODipine (NORVASC) 2.5 MG tablet Take 1 tablet by mouth at bedtime.   atorvastatin (LIPITOR) 40 MG tablet TAKE 1 TABLET DAILY AT 6 P.M.   clopidogrel (PLAVIX) 75 MG tablet TAKE 1 TABLET DAILY   cyanocobalamin 1000 MCG tablet Take 1 tablet (1,000 mcg total) by mouth daily.   fluticasone (FLONASE) 50 MCG/ACT nasal spray Place into both nostrils daily.   loratadine (CLARITIN) 10 MG tablet Take 10 mg by mouth daily.   Melatonin 3 MG CAPS Take by mouth as needed.   Multiple Vitamins-Minerals (CENTRUM SILVER PO) Take 1  tablet by mouth daily.   nitroGLYCERIN (NITROSTAT) 0.4 MG SL tablet DISSOLVE ONE TABLET UNDER THE TONGUE EVERY  5 MINUTES AS NEEDED FOR CHEST PAIN.  DO NOT EXCEED A TOTAL OF 3 DOSES IN 15 MINUTES   Psyllium (METAMUCIL FIBER PO) Take by mouth.   terazosin (HYTRIN) 2 MG capsule Take 4 mg by mouth every evening.   VITAMIN D, ERGOCALCIFEROL, PO Take 5,000 Units by mouth daily.     Allergies  Allergen Reactions   Other Shortness Of Breath    Esters   Contrast Media [Iodinated Diagnostic Agents] Other (See Comments)    Just feel bad all over   Morphine And Related Other (See Comments)    Patient states he does not like to take Morphine as it gives him nightmares and hallucinations.     SOCIAL HISTORY/FAMILY HISTORY   Reviewed in Epic:  Pertinent findings:  Social History   Tobacco Use   Smoking status: Former   Smokeless tobacco: Never  Substance Use Topics   Alcohol use: Yes    Comment: rare   Drug use: Never   Social History   Social History Narrative   Not on file    OBJCTIVE -PE, EKG, labs   Wt Readings from Last 3 Encounters:  02/21/21 191 lb 9.6 oz (86.9 kg)  01/11/20 192 lb 9.6 oz (87.4 kg)  05/02/19 191 lb (86.6 kg)    Physical Exam: BP (!) 147/73   Pulse 68   Ht 5' (1.524 m)   Wt 191 lb 9.6 oz (86.9 kg)   SpO2 97%   BMI 37.42 kg/m  Physical Exam   Adult ECG Report  Rate: 68 ;  Rhythm: normal sinus rhythm and 1  AVB, otherwise normal axis, intervals and durations. ; Minimal nonspecific ST and T wave changes.  Narrative Interpretation: Stable  Recent Labs:   03/07/2020: TC 127, TG 205, HDL 45, LDL 77. 07/30/2030: Cr 1.38, K+ 4.4. 09/04/2020: A1c 6.1. Lab Results  Component Value Date   CHOL 137 11/30/2017   HDL 38 (L) 11/30/2017   LDLCALC 80 11/30/2017   TRIG 93 11/30/2017   CHOLHDL 3.6 11/30/2017   Lab Results  Component Value Date   CREATININE 1.93 (H) 12/12/2017   BUN 28 (H) 12/12/2017   NA 135 12/12/2017   K 4.2 12/12/2017   CL 107  12/12/2017   CO2 20 (L) 12/12/2017   CBC Latest Ref Rng & Units 12/12/2017 12/11/2017 12/11/2017  WBC 4.0 - 10.5 K/uL 11.6(H) 16.7(H) 19.3(H)  Hemoglobin 13.0 - 17.0 g/dL 9.7(L) 9.7(L) 9.3(L)  Hematocrit 39.0 - 52.0 % 30.4(L) 29.4(L) 29.3(L)  Platelets 150 - 400 K/uL 176 173 158    Lab Results  Component Value Date   HGBA1C 5.7 (H) 11/30/2017   Lab Results  Component Value Date   TSH 5.308 (H) 12/04/2017    ==================================================  COVID-19 Education: The signs and symptoms of COVID-19 were discussed with the patient and how to seek care for testing (follow up with PCP or arrange E-visit).    I spent a total of 25 minutes with the patient spent in direct patient consultation.  Additional time spent with chart review  / charting (studies, outside notes, etc): 16 min Total Time: 41 min  Current medicines are reviewed at length with the patient today.  (+/- concerns) none  This visit occurred during the SARS-CoV-2 public health emergency.  Safety protocols were in place, including screening questions prior to the visit, additional usage of staff PPE, and extensive cleaning of exam room while observing appropriate contact time as indicated for disinfecting solutions.  Notice: This dictation was  prepared with Dragon dictation along with smaller phrase technology. Any transcriptional errors that result from this process are unintentional and may not be corrected upon review.  Patient Instructions / Medication Changes & Studies & Tests Ordered   Patient Instructions  Medication Instructions:  No changes  *If you need a refill on your cardiac medications before your next appointment, please call your pharmacy*   Lab Work: Not  needed If you have labs (blood work) drawn today and your tests are completely normal, you will receive your results only by: MyChart Message (if you have MyChart) OR A paper copy in the mail If you have any lab test that is  abnormal or we need to change your treatment, we will call you to review the results.   Testing/Procedures:  Not needed  Follow-Up: At [facility], you and your health needs are our priority.  As part of our continuing mission to provide you with exceptional heart care, we have created designated Provider Care Teams.  These Care Teams include your primary Cardiologist (physician) and Advanced Practice Providers (APPs -  Physician Assistants and Nurse Practitioners) who all work together to provide you with the care you need, when you need it.     Your next appointment:   12 month(s)  The format for your next appointment:   In Person  Provider:   Glenetta Hew, MD   Studies Ordered:   Orders Placed This Encounter  Procedures   EKG 12-Lead      Glenetta Hew, M.D., M.S. Interventional Cardiologist   Pager # [phone] Phone # [phone] [address] [place], [place] [address]   Thank you for choosing Heartcare at [facility]!!

## 2021-03-05 ENCOUNTER — Encounter: Payer: Self-pay | Admitting: Cardiology

## 2021-03-05 NOTE — Assessment & Plan Note (Signed)
As far as I can tell, he has not had any issues since his MI.  Based on previous discussions, per his wishes, he does not want to be on full oral anticoagulation.  He is happy on Plavix alone.  He does understand that this is not as protective as a DOAC, but is willing to take the risk.

## 2021-03-05 NOTE — Assessment & Plan Note (Signed)
Thankfully, no recurrent anginal symptoms.  LAD and circumflex were both treated with stents.  RCA is occluded with left-to-right collaterals.  As indicated above, not on beta-blocker because of bradycardia, not on ACE inhibitor/ARB due to concerns of renal insufficiency.  On low-dose amlodipine for antianginal benefit along with atorvastatin and standing dose Plavix.  No changes for now.

## 2021-03-05 NOTE — Assessment & Plan Note (Signed)
Minimally reduced EF likely related to the RCA.  Not requiring any diuretic.  Euvolemic on exam Not on beta-blocker because of bradycardia, and not on ACE/ARB because of underlying renal sufficiency.

## 2021-03-05 NOTE — Assessment & Plan Note (Signed)
Labs as of September 2021 were not quite at goal, but better than the previous visit.  He is on 40 mg atorvastatin.  We will continue current dose.

## 2021-03-05 NOTE — Assessment & Plan Note (Signed)
With PCI to the LAD and LCx with occluded RCA, will continue to monitor therapy with Plavix.  He is on Plavix for CAD but also for potential benefit/stroke prevention with prior A. fib.  If he were to have any recurrence of A. fib, we can consider stopping Plavix and going to rest.   Okay to hold Plavix 5-7 days preop for surgeries or procedures.

## 2021-03-05 NOTE — Assessment & Plan Note (Signed)
Blood pressure is a little bit high today, but better at home.  He is on very low-dose amlodipine.  No changes.

## 2021-03-05 NOTE — Assessment & Plan Note (Addendum)
He is now 3 years out from his MI.  He was found to have a CTO of the RCA -> he had then underwent staged PCI to the LAD and LCx.  Has not had any further anginal symptoms since. Mildly reduced EF but not unexpectedly inferior HK.  No heart failure.

## 2021-03-06 DIAGNOSIS — Z23 Encounter for immunization: Secondary | ICD-10-CM | POA: Diagnosis not present

## 2021-03-08 DIAGNOSIS — M1A09X0 Idiopathic chronic gout, multiple sites, without tophus (tophi): Secondary | ICD-10-CM | POA: Diagnosis not present

## 2021-03-08 DIAGNOSIS — Z79899 Other long term (current) drug therapy: Secondary | ICD-10-CM | POA: Diagnosis not present

## 2021-03-08 DIAGNOSIS — Z0000 Encounter for general adult medical examination without abnormal findings: Secondary | ICD-10-CM | POA: Diagnosis not present

## 2021-03-08 DIAGNOSIS — I129 Hypertensive chronic kidney disease with stage 1 through stage 4 chronic kidney disease, or unspecified chronic kidney disease: Secondary | ICD-10-CM | POA: Diagnosis not present

## 2021-03-08 DIAGNOSIS — R7303 Prediabetes: Secondary | ICD-10-CM | POA: Diagnosis not present

## 2021-03-08 DIAGNOSIS — E7800 Pure hypercholesterolemia, unspecified: Secondary | ICD-10-CM | POA: Diagnosis not present

## 2021-03-08 DIAGNOSIS — N2581 Secondary hyperparathyroidism of renal origin: Secondary | ICD-10-CM | POA: Diagnosis not present

## 2021-03-08 DIAGNOSIS — Z1389 Encounter for screening for other disorder: Secondary | ICD-10-CM | POA: Diagnosis not present

## 2021-03-08 DIAGNOSIS — N1832 Chronic kidney disease, stage 3b: Secondary | ICD-10-CM | POA: Diagnosis not present

## 2021-03-08 DIAGNOSIS — I270 Primary pulmonary hypertension: Secondary | ICD-10-CM | POA: Diagnosis not present

## 2021-03-08 DIAGNOSIS — I700 Atherosclerosis of aorta: Secondary | ICD-10-CM | POA: Diagnosis not present

## 2021-04-28 ENCOUNTER — Other Ambulatory Visit: Payer: Self-pay | Admitting: Cardiology

## 2021-05-06 DIAGNOSIS — N183 Chronic kidney disease, stage 3 unspecified: Secondary | ICD-10-CM | POA: Diagnosis not present

## 2021-05-16 DIAGNOSIS — N183 Chronic kidney disease, stage 3 unspecified: Secondary | ICD-10-CM | POA: Diagnosis not present

## 2021-05-16 DIAGNOSIS — I129 Hypertensive chronic kidney disease with stage 1 through stage 4 chronic kidney disease, or unspecified chronic kidney disease: Secondary | ICD-10-CM | POA: Diagnosis not present

## 2021-05-16 DIAGNOSIS — N2581 Secondary hyperparathyroidism of renal origin: Secondary | ICD-10-CM | POA: Diagnosis not present

## 2021-05-16 DIAGNOSIS — D631 Anemia in chronic kidney disease: Secondary | ICD-10-CM | POA: Diagnosis not present

## 2021-10-25 ENCOUNTER — Other Ambulatory Visit: Payer: Self-pay | Admitting: Cardiology

## 2022-01-06 ENCOUNTER — Telehealth: Payer: Self-pay | Admitting: Cardiology

## 2022-01-06 NOTE — Telephone Encounter (Signed)
Patient c/o Palpitations:  High priority if patient c/o lightheadedness, shortness of breath, or chest pain  How long have you had palpitations/irregular HR/ Afib? Are you having the symptoms now? 10 days  Are you currently experiencing lightheadedness, SOB or CP? No   Do you have a history of afib (atrial fibrillation) or irregular heart rhythm? Yes   Have you checked your BP or HR? (document readings if available): HR is high and irregular  Are you experiencing any other symptoms? [place]

## 2022-01-06 NOTE — Telephone Encounter (Signed)
Spoke with daughter - she reports his dad is having irregularities with heart rhythm and HR changed from 50-60s up to 80-102bpm. H/O remote PAF. Denies chest pain, shortness of breath. Legs more swollen than previous.   Scheduled for visit with [place] PA on 7/11 @ 2:45pm

## 2022-01-07 ENCOUNTER — Ambulatory Visit (INDEPENDENT_AMBULATORY_CARE_PROVIDER_SITE_OTHER): Payer: Medicare Other | Admitting: Physician Assistant

## 2022-01-07 VITALS — BP 128/60 | HR 59 | Ht 65.0 in | Wt 193.0 lb

## 2022-01-07 DIAGNOSIS — I2510 Atherosclerotic heart disease of native coronary artery without angina pectoris: Secondary | ICD-10-CM | POA: Diagnosis not present

## 2022-01-07 DIAGNOSIS — Z79899 Other long term (current) drug therapy: Secondary | ICD-10-CM | POA: Diagnosis not present

## 2022-01-07 DIAGNOSIS — R600 Localized edema: Secondary | ICD-10-CM | POA: Diagnosis not present

## 2022-01-07 DIAGNOSIS — I255 Ischemic cardiomyopathy: Secondary | ICD-10-CM

## 2022-01-07 DIAGNOSIS — E785 Hyperlipidemia, unspecified: Secondary | ICD-10-CM

## 2022-01-07 DIAGNOSIS — I10 Essential (primary) hypertension: Secondary | ICD-10-CM

## 2022-01-07 DIAGNOSIS — I480 Paroxysmal atrial fibrillation: Secondary | ICD-10-CM

## 2022-01-07 MED ORDER — FUROSEMIDE 20 MG PO TABS: ORAL_TABLET | ORAL | 2 refills | Status: DC

## 2022-01-07 NOTE — Patient Instructions (Signed)
Medication Instructions:  Your physician has recommended you make the following change in your medication: START: Eliquis STOP: Clopidogrel (Plavix) '75mg'$  daily once you start Eliquis START: Furosamide (Lasix) '20mg'$  daily for 5 days then you may take as needed.  *If you need a refill on your cardiac medications before your next appointment, please call your pharmacy*   Lab Work: BMET ordered for [place] physician recommends that you return in 1-2 weeks for : BMET and CBC  If you have labs (blood work) drawn today and your tests are completely normal, you will receive your results only by: [place] (if you have MyChart) OR A paper copy in the mail If you have any lab test that is abnormal or we need to change your treatment, we will call you to review the results.   Testing/Procedures: Your physician has requested that you have an echocardiogram. Echocardiography is a painless test that uses sound waves to create images of your heart. It provides your doctor with information about the size and shape of your heart and how well your heart's chambers and valves are working. This procedure takes approximately one hour. There are no restrictions for this procedure.    Follow-Up: At [facility], you and your health needs are our priority.  As part of our continuing mission to provide you with exceptional heart care, we have created designated Provider Care Teams.  These Care Teams include your primary Cardiologist (physician) and Advanced Practice Providers (APPs -  Physician Assistants and Nurse Practitioners) who all work together to provide you with the care you need, when you need it.  We recommend signing up for the patient portal called "MyChart".  Sign up information is provided on this After Visit Summary.  MyChart is used to connect with patients for Virtual Visits (Telemedicine).  Patients are able to view lab/test results, encounter notes, upcoming appointments, etc.   Non-urgent messages can be sent to your provider as well.   To learn more about what you can do with MyChart, go to [email or web address].    Your next appointment:   3-4 week(s)  The format for your next appointment:   In Person  Provider:   Almyra Deforest, PA-C    {

## 2022-01-07 NOTE — Progress Notes (Unsigned)
Cardiology Office Note:    Date:  01/07/2022   ID:  Paul Kramer, DOB [birth year], MRN [id number]  PCP:  Lajean Manes, [facility] Providers Cardiologist:  Glenetta Hew, MD { Click to update primary MD,subspecialty MD or APP then REFRESH:1}    Referring MD: Lajean Manes, MD   Chief Complaint  Patient presents with   Follow-up   Shortness of Breath   Edema    Feet and ankles.  ***  History of Present Illness:    Paul Kramer is a 88 y.o. male with a hx of CAD, PAF, hypertension, hyperlipidemia, and history of ischemic cardiomyopathy.  Patient was originally diagnosed with inferior STEMI in June 2019.  Emergent cardiac catheterization at the time revealed EF 45 to 50%, 80% proximal to mid RCA, mid to distal RCA had 100% occlusion with 90% stenosis in the sidebranch of the posterior atrial artery, 40% ostial to mid left main lesion, 95% stenosis in mid to distal left circumflex artery with 50% lesion in ostial OM 2, 85% lesion in ostial to proximal LAD.  Attempt was made to open up the RCA, however unable to cross the RCA lesion confirming CTO.  Patient returned to the Cath Lab on 12/07/2017 he underwent underwent staged balloon angioplasty of OM 2, DES to left circumflex, and DES to proximal LAD.  Echocardiogram obtained on 11/30/2017 showed EF 45 to 50%, moderate AI, mild to moderate MR.  Venous Doppler obtained on the same day was negative for DVT.  He is on low-dose amlodipine for antianginal benefit along with Plavix and Lipitor.  Not on ACE inhibitor or ARB due to concern of renal insufficiency.  He has not had any recurrence of A-fib since his MI, therefore based on previous discussion, he did not wish to be on full anticoagulation unless he has recurrence.  Patient presents today for follow-up.  His daughter is a Designer, jewellery, he is son in law is a PA.  They have been noticing his heart rate elevated in the past 10 days.  He used to be a runner and that his  heart rate has always been in the 50s to low 60s, however since 10 days ago, his heart rate has been elevated in the 90s to low 100 range.  He denies any cardiac awareness of palpitation.  Last night, he did have a pain in the middle of his back for several hours.  This morning, prior to coming in, his daughter continued to noticed a heart rate in the 90s.  However by the time she arrived in the office, he is back in normal sinus rhythm with heart rate in the 50s.  I suspect the patient has been in atrial fibrillation for the past 10 days and is just converted right prior to this visit.  I discussed the case with DOD, we recommend to switch the patient to Eliquis, however he he will need a basic metabolic panel today to follow-up on his creatinine level before deciding on the Eliquis dosage.  He also has 2+ pitting edema in bilateral lower extremity.  He appears to be volume overloaded.  Fortunately his lung is clear.  I recommended Lasix 20 mg daily for 5 days before changed to as needed.  He will need a echocardiogram.  Dr. Phineas Inches, DOD also recommended a 2-week ZIO monitor, however patient has just gotten a cardia mobile device, both daughter and the son-in-law can follow-up on his heart rate and obtain EKG strip as needed.  It was decided to hold off on ZIO monitor.  I will see the patient back in 3 to 4 weeks.  When he does start on Eliquis, he will need to discontinue Plavix.  Past Medical History:  Diagnosis Date   First detected episode of atrial fibrillation ([place]) 11/29/2017   In setting of STEMI -- NO further documented episodes   Hypercholesteremia    Hypertension    Multiple vessel coronary artery disease     Cath 11/29/17 - 3 V CAD (CTO RCA), 95% m-d Cx (@ OM2 55%)-> PCI Cx-PTCA Om2; 85% p-m LAD => DES PCI    Myocardial infarction of inferolateral wall ([place]) 11/2017   ? prior Infarct; - Cath 11/29/17 - 3 V CAD (CTO RCA), 95% m-d Cx (@ OM2 55%)-> PCI Cx-PTCA Om2; 85% p-m LAD => DES PCI     Narcolepsy    pt may pass out when laughing   Spinal stenosis    TIA (transient ischemic attack)     Past Surgical History:  Procedure Laterality Date   CORONARY BALLOON ANGIOPLASTY N/A 11/29/2017   Procedure: CORONARY BALLOON ANGIOPLASTY;  Surgeon: Leonie Man, MD;  Location: [place] [facility];  Service: Cardiovascular;;  - PTCA of RCA - unable to cross -- LIKELY CTO.   CORONARY STENT INTERVENTION N/A 12/07/2017   Procedure: CORONARY STENT INTERVENTION;  Surgeon: Leonie Man, MD;  Location: [facility];  Service: Cardiovascular; Mid-dist LCx 95% with 55% OM2 => Scoring PTCA of OM2 (20%), LCx PCI - Synergy DES 3 x 16 (3.3 mm); prox LAD 85% PCI- Synergy DES 3 x 16 (3.6 mm).   LEFT HEART CATH N/A 12/07/2017   Procedure: Left Heart Cath;  Surgeon: Leonie Man, MD;  Location: [facility];  Service: Cardiovascular;  Laterality: N/A;   LEFT HEART CATH AND CORONARY ANGIOGRAPHY N/A 11/29/2017   Procedure: LEFT HEART CATH AND CORONARY ANGIOGRAPHY;  Surgeon: Leonie Man, MD;  Location: [facility];; mRCA CTO, p-m LAD 85%, m-dCx 95% @ OM2 55%. Mod High LVEDP    LIPOMA EXCISION     SPINAL FIXATION SURGERY     TRANSTHORACIC ECHOCARDIOGRAM  11/30/2017   EF 45-50%.  Inferior hypokinesis.  Elevated filling pressures.  Mild to moderate MR.     Current Medications: Current Meds  Medication Sig   acetaminophen (TYLENOL) 500 MG tablet Take 500 mg by mouth every 6 (six) hours as needed.   allopurinol (ZYLOPRIM) 100 MG tablet Take 100 mg by mouth every morning.   amLODipine (NORVASC) 5 MG tablet Take 5 mg by mouth daily.   amoxicillin-clavulanate (AUGMENTIN) 875-125 MG tablet Take 1 tablet by mouth every 12 (twelve) hours.   atorvastatin (LIPITOR) 40 MG tablet TAKE 1 TABLET DAILY AT 6 P.M.   clopidogrel (PLAVIX) 75 MG tablet TAKE 1 TABLET DAILY   Cyanocobalamin (VITAMIN B-12) 5000 MCG TBDP Take 1 tablet by mouth daily.   fluticasone (FLONASE) 50 MCG/ACT nasal spray  Place into both nostrils daily.   loratadine (CLARITIN) 10 MG tablet Take 10 mg by mouth daily.   Melatonin 10 MG CAPS Take 10 mg by mouth daily.   Multiple Vitamins-Minerals (CENTRUM SILVER PO) Take 1 tablet by mouth daily.   nitroGLYCERIN (NITROSTAT) 0.4 MG SL tablet DISSOLVE ONE TABLET UNDER THE TONGUE EVERY 5 MINUTES AS NEEDED FOR CHEST PAIN.  DO NOT EXCEED A TOTAL OF 3 DOSES IN 15 MINUTES   Psyllium (METAMUCIL FIBER PO) Take by mouth.   terazosin (  HYTRIN) 2 MG capsule Take 4 mg by mouth every evening.   VITAMIN D, ERGOCALCIFEROL, PO Take 5,000 Units by mouth daily.    [DISCONTINUED] amLODipine (NORVASC) 2.5 MG tablet Take 1 tablet by mouth at bedtime.   [DISCONTINUED] cyanocobalamin 1000 MCG tablet Take 1 tablet (1,000 mcg total) by mouth daily.   [DISCONTINUED] famotidine (PEPCID) 20 MG tablet Take 20 mg by mouth 2 (two) times daily.   [DISCONTINUED] Melatonin 3 MG CAPS Take by mouth as needed.     Allergies:   Other, Contrast media [iodinated contrast media], and Morphine and related   Social History   Socioeconomic History   Marital status: Unknown    Spouse name: Not on file   Number of children: Not on file   Years of education: Not on file   Highest education level: Not on file  Occupational History   Occupation: retired  Tobacco Use   Smoking status: Former   Smokeless tobacco: Never  Scientific laboratory technician Use: Not on file  Substance and Sexual Activity   Alcohol use: Yes    Comment: rare   Drug use: Never   Sexual activity: Not on file  Other Topics Concern   Not on file  Social History Narrative   Not on file   Social Determinants of Health   Financial Resource Strain: [place]  (02/04/2018)   Overall Financial Resource Strain (CARDIA)    Difficulty of Paying Living Expenses: Not hard at all  Food Insecurity: No Food Insecurity (02/04/2018)   Hunger Vital Sign    Worried About Running Out of Food in the Last Year: Never true    [place] in the Last  Year: Never true  Transportation Needs: No Transportation Needs (02/04/2018)   PRAPARE - Hydrologist (Medical): No    Lack of Transportation (Non-Medical): No  Physical Activity: Inactive (02/04/2018)   Exercise Vital Sign    Days of Exercise per Week: 0 days    Minutes of Exercise per Session: 0 min  Stress: No Stress Concern Present (02/04/2018)   [place]    Feeling of Stress : Only a little  Social Connections: Not on file     Family History: The patient's ***Family history is unknown by patient.  ROS:   Please see the history of present illness.    *** All other systems reviewed and are negative.  EKGs/Labs/Other Studies Reviewed:    The following studies were reviewed today: ***  EKG:  EKG is *** ordered today.  The ekg ordered today demonstrates ***  Recent Labs: No results found for requested labs within last 365 days.  Recent Lipid Panel    Component Value Date/Time   CHOL 137 11/30/2017 0017   TRIG 93 11/30/2017 0017   HDL 38 (L) 11/30/2017 0017   CHOLHDL 3.6 11/30/2017 0017   VLDL 19 11/30/2017 0017   LDLCALC 80 11/30/2017 0017     Risk Assessment/Calculations:   {Does this patient have ATRIAL FIBRILLATION?:[phone]}       Physical Exam:    VS:  BP 128/60 (BP Location: Left Arm, Patient Position: Sitting, Cuff Size: Normal)   Pulse (!) 59   Ht '5\' 5"'$  (1.651 m)   Wt 193 lb (87.5 kg)   BMI 32.12 kg/m     Wt Readings from Last 3 Encounters:  01/07/22 193 lb (87.5 kg)  02/21/21 191 lb 9.6 oz (86.9 kg)  01/11/20 192 lb 9.6 oz (87.4 kg)     GEN: *** Well nourished, well developed in no acute distress HEENT: Normal NECK: No JVD; No carotid bruits LYMPHATICS: No lymphadenopathy CARDIAC: ***RRR, no murmurs, rubs, gallops RESPIRATORY:  Clear to auscultation without rales, wheezing or rhonchi  ABDOMEN: Soft, non-tender, non-distended MUSCULOSKELETAL:  No  edema; No deformity  SKIN: Warm and dry NEUROLOGIC:  Alert and oriented x 3 PSYCHIATRIC:  Normal affect   ASSESSMENT:    No diagnosis found. PLAN:    In order of problems listed above:  ***      {Are you ordering a CV Procedure (e.g. stress test, cath, DCCV, TEE, etc)?   Press F2        :[id number]}    Medication Adjustments/Labs and Tests Ordered: Current medicines are reviewed at length with the patient today.  Concerns regarding medicines are outlined above.  No orders of the defined types were placed in this encounter.  No orders of the defined types were placed in this encounter.   There are no Patient Instructions on file for this visit.   Hilbert Corrigan, [place]  01/07/2022 2:39 PM    [place]

## 2022-01-08 ENCOUNTER — Telehealth: Payer: Self-pay

## 2022-01-08 ENCOUNTER — Other Ambulatory Visit: Payer: Self-pay

## 2022-01-08 LAB — BASIC METABOLIC PANEL
BUN/Creatinine Ratio: 17 (ref 10–24)
BUN: 26 mg/dL (ref 8–27)
CO2: 23 mmol/L (ref 20–29)
Calcium: 8.8 mg/dL (ref 8.6–10.2)
Chloride: 108 mmol/L — ABNORMAL HIGH (ref 96–106)
Creatinine, Ser: 1.56 mg/dL — ABNORMAL HIGH (ref 0.76–1.27)
Glucose: 103 mg/dL — ABNORMAL HIGH (ref 70–99)
Potassium: 4.9 mmol/L (ref 3.5–5.2)
Sodium: 142 mmol/L (ref 134–144)
eGFR: 42 mL/min/{1.73_m2} — ABNORMAL LOW (ref >59)

## 2022-01-08 MED ORDER — APIXABAN 2.5 MG PO TABS: 2.5000 mg | ORAL_TABLET | Freq: Two times a day (BID) | ORAL | 0 refills | Status: DC

## 2022-01-08 MED ORDER — APIXABAN 2.5 MG PO TABS: 2.5000 mg | ORAL_TABLET | Freq: Two times a day (BID) | ORAL | 3 refills | Status: DC

## 2022-01-08 NOTE — Telephone Encounter (Signed)
Prescription refill request for Eliquis received. Indication: Afib  Last office visit:01/07/22 Eulas Post)  Scr: 1.56 (01/07/22) Age: 88 Weight: 87.5kg  Appropriate dose and refill sent to requested pharmacy by [facility], [place].

## 2022-01-08 NOTE — Telephone Encounter (Signed)
Per Almyra Deforest: please inform the patient, lab came back showing Cr 1.56, will start patient on 2.'5mg'$  BID of Eliquis, please send in the Rx. When he start on Eliquis, please discontinue Plavix  Called and spoke with the patient's daughter in regards to Hao's comments, She gave a verbal understanding. I did discontinue the Plavix in his chart and sent his new RX to his local pharmacy.

## 2022-01-08 NOTE — Progress Notes (Signed)
Cr 1.56, will start on 2.'5mg'$  BID Eliquis.

## 2022-01-13 ENCOUNTER — Other Ambulatory Visit: Payer: Self-pay

## 2022-01-13 DIAGNOSIS — Z79899 Other long term (current) drug therapy: Secondary | ICD-10-CM

## 2022-01-13 DIAGNOSIS — R600 Localized edema: Secondary | ICD-10-CM

## 2022-01-14 DIAGNOSIS — Z79899 Other long term (current) drug therapy: Secondary | ICD-10-CM | POA: Diagnosis not present

## 2022-01-14 DIAGNOSIS — R600 Localized edema: Secondary | ICD-10-CM | POA: Diagnosis not present

## 2022-01-15 LAB — BASIC METABOLIC PANEL
BUN/Creatinine Ratio: 16 (ref 10–24)
BUN: 29 mg/dL — ABNORMAL HIGH (ref 8–27)
CO2: 22 mmol/L (ref 20–29)
Calcium: 9 mg/dL (ref 8.6–10.2)
Chloride: 106 mmol/L (ref 96–106)
Creatinine, Ser: 1.76 mg/dL — ABNORMAL HIGH (ref 0.76–1.27)
Glucose: 83 mg/dL (ref 70–99)
Potassium: 4.4 mmol/L (ref 3.5–5.2)
Sodium: 141 mmol/L (ref 134–144)
eGFR: 37 mL/min/{1.73_m2} — ABNORMAL LOW (ref >59)

## 2022-01-15 LAB — CBC
Hematocrit: 34.6 % — ABNORMAL LOW (ref 37.5–51.0)
Hemoglobin: 11.6 g/dL — ABNORMAL LOW (ref 13.0–17.7)
MCH: 35 pg — ABNORMAL HIGH (ref 26.6–33.0)
MCHC: 33.5 g/dL (ref 31.5–35.7)
MCV: 105 fL — ABNORMAL HIGH (ref 79–97)
Platelets: 206 10*3/uL (ref 150–450)
RBC: 3.31 x10E6/uL — ABNORMAL LOW (ref 4.14–5.80)
RDW: 13 % (ref 11.6–15.4)
WBC: 6 10*3/uL (ref 3.4–10.8)

## 2022-01-20 ENCOUNTER — Ambulatory Visit (HOSPITAL_COMMUNITY): Payer: Medicare Other | Attending: Cardiology

## 2022-01-20 DIAGNOSIS — R600 Localized edema: Secondary | ICD-10-CM | POA: Diagnosis not present

## 2022-01-20 LAB — ECHOCARDIOGRAM COMPLETE
AR max vel: 1.68 cm2
AV Area VTI: 1.58 cm2
AV Area mean vel: 1.6 cm2
AV Mean grad: 9 mmHg
AV Peak grad: 16.5 mmHg
Ao pk vel: 2.03 m/s
Area-P 1/2: 3.85 cm2
Calc EF: 49.6 %
P 1/2 time: 547 msec
S' Lateral: 3.8 cm
Single Plane A2C EF: 46.3 %
Single Plane A4C EF: 51.3 %

## 2022-01-22 DIAGNOSIS — N183 Chronic kidney disease, stage 3 unspecified: Secondary | ICD-10-CM | POA: Diagnosis not present

## 2022-01-28 ENCOUNTER — Ambulatory Visit (INDEPENDENT_AMBULATORY_CARE_PROVIDER_SITE_OTHER): Payer: Medicare Other | Admitting: Physician Assistant

## 2022-01-28 ENCOUNTER — Encounter: Payer: Self-pay | Admitting: Physician Assistant

## 2022-01-28 VITALS — BP 116/78 | HR 82 | Ht 66.0 in | Wt 191.0 lb

## 2022-01-28 DIAGNOSIS — E785 Hyperlipidemia, unspecified: Secondary | ICD-10-CM

## 2022-01-28 DIAGNOSIS — I480 Paroxysmal atrial fibrillation: Secondary | ICD-10-CM

## 2022-01-28 DIAGNOSIS — I2510 Atherosclerotic heart disease of native coronary artery without angina pectoris: Secondary | ICD-10-CM

## 2022-01-28 DIAGNOSIS — I255 Ischemic cardiomyopathy: Secondary | ICD-10-CM | POA: Diagnosis not present

## 2022-01-28 DIAGNOSIS — I10 Essential (primary) hypertension: Secondary | ICD-10-CM | POA: Diagnosis not present

## 2022-01-28 MED ORDER — METOPROLOL TARTRATE 25 MG PO TABS: 12.5000 mg | ORAL_TABLET | Freq: Two times a day (BID) | ORAL | 1 refills | Status: DC

## 2022-01-28 MED ORDER — METOPROLOL TARTRATE 25 MG PO TABS: 12.5000 mg | ORAL_TABLET | Freq: Two times a day (BID) | ORAL | 0 refills | Status: DC

## 2022-01-28 NOTE — Patient Instructions (Signed)
Medication Instructions:  Your physician has recommended you make the following change in your medication STOP: Amlodipine '5mg'$  START: Metoprolol Tartrate 12.'5mg'$  TWICE DAILY (the medication will be a '25mg'$  tablet please take half a tablet twice daily).   *If you need a refill on your cardiac medications before your next appointment, please call your pharmacy*   Lab Work: NONE If you have labs (blood work) drawn today and your tests are completely normal, you will receive your results only by: [place] (if you have MyChart) OR A paper copy in the mail If you have any lab test that is abnormal or we need to change your treatment, we will call you to review the results.   Testing/Procedures: NONE   Follow-Up: At [facility], you and your health needs are our priority.  As part of our continuing mission to provide you with exceptional heart care, we have created designated Provider Care Teams.  These Care Teams include your primary Cardiologist (physician) and Advanced Practice Providers (APPs -  Physician Assistants and Nurse Practitioners) who all work together to provide you with the care you need, when you need it.  We recommend signing up for the patient portal called "MyChart".  Sign up information is provided on this After Visit Summary.  MyChart is used to connect with patients for Virtual Visits (Telemedicine).  Patients are able to view lab/test results, encounter notes, upcoming appointments, etc.  Non-urgent messages can be sent to your provider as well.   To learn more about what you can do with MyChart, go to [email or web address].    Your next appointment:   3-4 month(s)  The format for your next appointment:   In Person  Provider:   Glenetta Hew, MD

## 2022-01-28 NOTE — Progress Notes (Unsigned)
Cardiology Office Note:    Date:  01/30/2022   ID:  Paul Kramer, DOB 08/07/1929, MRN [id number]  PCP:  Lajean Manes, [place] Providers Cardiologist:  Glenetta Hew, MD     Referring MD: Lajean Manes, MD   Chief Complaint  Patient presents with   Follow-up    Seen for Dr. Ellyn Hack    History of Present Illness:    Paul Kramer is a 88 y.o. male with a hx of CAD, PAF, HTN, HLD, and history of ischemic cardiomyopathy.  Patient was originally diagnosed with inferior STEMI in June 2019.  Emergent cardiac catheterization at the time revealed EF 45 to 50%, 80% proximal to mid RCA, mid to distal RCA had 100% occlusion with 90% stenosis in the sidebranch of the posterior atrial artery, 40% ostial to mid left main lesion, 95% stenosis in mid to distal left circumflex artery with 50% lesion in ostial OM 2, 85% lesion in ostial to proximal LAD.  Attempt was made to open up the RCA, however unable to cross the RCA lesion confirming CTO.  Patient returned to the Cath Lab on 12/07/2017 he underwent underwent staged balloon angioplasty of OM 2, DES to left circumflex, and DES to proximal LAD.  Echocardiogram obtained on 11/30/2017 showed EF 45 to 50%, moderate AI, mild to moderate MR.  Venous Doppler obtained on the same day was negative for DVT.  He is on low-dose amlodipine for antianginal benefit along with Plavix and Lipitor.  Not on ACE inhibitor or ARB due to concern of renal insufficiency.  He has not had any recurrence of A-fib since his MI, therefore based on previous discussion, he did not wish to be on full anticoagulation unless he has recurrence.  I last saw the patient on 01/07/2022, patient has noted elevated heart rate for 10 days prior to the office visit.  He used to be a runner and that his heart rate has always been in the 50s to low 60s, however family has noted his heart rate was in the 90s to low 100 range for 10 days.  However by the time he arrived in the office  for examination, his heart rate has went back into the 50s.  I suspected the patient was seen atrial fibrillation for 10 days as family (daughter's nurse practitioner, son-in-law is retired cardiac anesthesiologist) noted irregularly irregular heartbeat.  On exam, he also had 2+ pitting edema in bilateral lower extremity as well.  I recommended Lasix 20 mg daily for 5 days before changed to as needed.  I discussed the case with DOD we recommended start Eliquis.  Daily also recommended a 2-week heart monitor, however family just got a Kardia mobile device, therefore we decided to forego the heart monitor and use Kardia mobile device instead.  He was started on Eliquis the following day on 7/12 and Plavix discontinued since then.  Echocardiogram obtained on 01/20/2022 showed EF 51%, no regional wall motion abnormality, grade 2 DD, moderately dilated left atrium, mild to moderate MR, moderate AI, dilated ascending aorta at 43 mm.  Patient presents today for follow-up.  His daughter was not able to accompany him today as she was diagnosed with COVID and had to stay home.  He is accompanied by his son-in-law who is a retired Magazine features editor.  He denies any chest pain or shortness of breath.  He has noticed his heart rate has been elevated since Saturday.  Kardia mobile device tracing suggest he is back in A-fib.  This was confirmed by our EKG in the office as well.  Heart rate is 109 bpm.  I recommended discontinue amlodipine and switch him to 12.5 mg twice a day of metoprolol to tartrate.  If he is still in A-fib on the next visit, may need to discuss antiarrhythmic therapy with the patient such as amiodarone given moderately dilated left atrium.  Hopefully he may self convert on the metoprolol.  He can follow-up with Dr. Ellyn Hack in 3 to 4 months  Past Medical History:  Diagnosis Date   First detected episode of atrial fibrillation ([place]) 11/29/2017   In setting of STEMI -- NO further documented episodes    Hypercholesteremia    Hypertension    Multiple vessel coronary artery disease     Cath 11/29/17 - 3 V CAD (CTO RCA), 95% m-d Cx (@ OM2 55%)-> PCI Cx-PTCA Om2; 85% p-m LAD => DES PCI    Myocardial infarction of inferolateral wall ([place]) 11/2017   ? prior Infarct; - Cath 11/29/17 - 3 V CAD (CTO RCA), 95% m-d Cx (@ OM2 55%)-> PCI Cx-PTCA Om2; 85% p-m LAD => DES PCI    Narcolepsy    pt may pass out when laughing   Spinal stenosis    TIA (transient ischemic attack)     Past Surgical History:  Procedure Laterality Date   CORONARY BALLOON ANGIOPLASTY N/A 11/29/2017   Procedure: CORONARY BALLOON ANGIOPLASTY;  Surgeon: Leonie Man, MD;  Location: [facility];  Service: Cardiovascular;;  - PTCA of RCA - unable to cross -- LIKELY CTO.   CORONARY STENT INTERVENTION N/A 12/07/2017   Procedure: CORONARY STENT INTERVENTION;  Surgeon: Leonie Man, MD;  Location: [facility];  Service: Cardiovascular; Mid-dist LCx 95% with 55% OM2 => Scoring PTCA of OM2 (20%), LCx PCI - Synergy DES 3 x 16 (3.3 mm); prox LAD 85% PCI- Synergy DES 3 x 16 (3.6 mm).   LEFT HEART CATH N/A 12/07/2017   Procedure: Left Heart Cath;  Surgeon: Leonie Man, MD;  Location: [facility];  Service: Cardiovascular;  Laterality: N/A;   LEFT HEART CATH AND CORONARY ANGIOGRAPHY N/A 11/29/2017   Procedure: LEFT HEART CATH AND CORONARY ANGIOGRAPHY;  Surgeon: Leonie Man, MD;  Location: [facility];; mRCA CTO, p-m LAD 85%, m-dCx 95% @ OM2 55%. Mod High LVEDP    LIPOMA EXCISION     SPINAL FIXATION SURGERY     TRANSTHORACIC ECHOCARDIOGRAM  11/30/2017   EF 45-50%.  Inferior hypokinesis.  Elevated filling pressures.  Mild to moderate MR.     Current Medications: Current Meds  Medication Sig   acetaminophen (TYLENOL) 500 MG tablet Take 500 mg by mouth every 6 (six) hours as needed.   allopurinol (ZYLOPRIM) 100 MG tablet Take 100 mg by mouth every morning.   amoxicillin-clavulanate (AUGMENTIN) 875-125  MG tablet Take 1 tablet by mouth every 12 (twelve) hours.   apixaban (ELIQUIS) 2.5 MG TABS tablet Take 1 tablet (2.5 mg total) by mouth 2 (two) times daily.   atorvastatin (LIPITOR) 40 MG tablet TAKE 1 TABLET DAILY AT 6 P.M.   Cyanocobalamin (VITAMIN B-12) 5000 MCG TBDP Take 1 tablet by mouth daily.   fluticasone (FLONASE) 50 MCG/ACT nasal spray Place into both nostrils daily.   furosemide (LASIX) 20 MG tablet Take 1 tablet by mouth daily for the next 5 days, Then take as needed for leg edema.   loratadine (CLARITIN) 10 MG tablet Take 10 mg by mouth daily.  Melatonin 10 MG CAPS Take 10 mg by mouth daily.   metoprolol tartrate (LOPRESSOR) 25 MG tablet Take 0.5 tablets (12.5 mg total) by mouth 2 (two) times daily.   metoprolol tartrate (LOPRESSOR) 25 MG tablet Take 0.5 tablets (12.5 mg total) by mouth 2 (two) times daily.   Multiple Vitamins-Minerals (CENTRUM SILVER PO) Take 1 tablet by mouth daily.   nitroGLYCERIN (NITROSTAT) 0.4 MG SL tablet DISSOLVE ONE TABLET UNDER THE TONGUE EVERY 5 MINUTES AS NEEDED FOR CHEST PAIN.  DO NOT EXCEED A TOTAL OF 3 DOSES IN 15 MINUTES   Psyllium (METAMUCIL FIBER PO) Take by mouth.   terazosin (HYTRIN) 2 MG capsule Take 4 mg by mouth every evening.   VITAMIN D, ERGOCALCIFEROL, PO Take 5,000 Units by mouth daily.    [DISCONTINUED] amLODipine (NORVASC) 5 MG tablet Take 5 mg by mouth daily.     Allergies:   Other, Contrast media [iodinated contrast media], and Morphine and related   Social History   Socioeconomic History   Marital status: Unknown    Spouse name: Not on file   Number of children: Not on file   Years of education: Not on file   Highest education level: Not on file  Occupational History   Occupation: retired  Tobacco Use   Smoking status: Former   Smokeless tobacco: Never  Scientific laboratory technician Use: Not on file  Substance and Sexual Activity   Alcohol use: Yes    Comment: rare   Drug use: Never   Sexual activity: Not on file  Other  Topics Concern   Not on file  Social History Narrative   Not on file   Social Determinants of Health   Financial Resource Strain: [place]  (02/04/2018)   Overall Financial Resource Strain (CARDIA)    Difficulty of Paying Living Expenses: Not hard at all  Food Insecurity: No Food Insecurity (02/04/2018)   Hunger Vital Sign    Worried About Running Out of Food in the Last Year: Never true    [place] in the Last Year: Never true  Transportation Needs: No Transportation Needs (02/04/2018)   PRAPARE - Hydrologist (Medical): No    Lack of Transportation (Non-Medical): No  Physical Activity: Inactive (02/04/2018)   Exercise Vital Sign    Days of Exercise per Week: 0 days    Minutes of Exercise per Session: 0 min  Stress: No Stress Concern Present (02/04/2018)   [place]    Feeling of Stress : Only a little  Social Connections: Not on file     Family History: The patient's Family history is unknown by patient.  ROS:   Please see the history of present illness.     All other systems reviewed and are negative.  EKGs/Labs/Other Studies Reviewed:    The following studies were reviewed today:  Echo 01/20/2022 1. Left ventricular ejection fraction by 3D volume is 51 %. The left  ventricle has low normal function. The left ventricle has no regional wall  motion abnormalities. Left ventricular diastolic parameters are consistent  with Grade II diastolic  dysfunction (pseudonormalization).   2. Right ventricular systolic function is normal. The right ventricular  size is normal.   3. Left atrial size was moderately dilated.   4. The mitral valve is normal in structure. Mild to moderate mitral valve  regurgitation. No evidence of mitral stenosis.   5. The aortic valve is  tricuspid. The right cusp appears to be  restrictive in motion. Aortic valve regurgitation is moderate. Mild aortic   valve stenosis. Aortic regurgitation PHT measures 547 msec.   6. There is mild dilatation of the aortic root, measuring 37 mm. There is  moderate dilatation of the ascending aorta, measuring 43 mm.   7. The inferior vena cava is normal in size with greater than 50%  respiratory variability, suggesting right atrial pressure of 3 mmHg.   EKG:  EKG is ordered today.  The ekg ordered today demonstrates atrial fibrillation, heart rate 109 bpm  Recent Labs: 01/14/2022: BUN 29; Creatinine, Ser 1.76; Hemoglobin 11.6; Platelets 206; Potassium 4.4; Sodium 141  Recent Lipid Panel    Component Value Date/Time   CHOL 137 11/30/2017 0017   TRIG 93 11/30/2017 0017   HDL 38 (L) 11/30/2017 0017   CHOLHDL 3.6 11/30/2017 0017   VLDL 19 11/30/2017 0017   LDLCALC 80 11/30/2017 0017     Risk Assessment/Calculations:    CHA2DS2-VASc Score = 5   This indicates a 7.2% annual risk of stroke. The patient's score is based upon: CHF History: 1 HTN History: 1 Diabetes History: 0 Stroke History: 0 Vascular Disease History: 1 Age Score: 2 Gender Score: 0          Physical Exam:    VS:  BP 116/78   Pulse 82   Ht '5\' 6"'$  (1.676 m)   Wt 191 lb (86.6 kg)   SpO2 96%   BMI 30.83 kg/m        Wt Readings from Last 3 Encounters:  01/28/22 191 lb (86.6 kg)  01/07/22 193 lb (87.5 kg)  02/21/21 191 lb 9.6 oz (86.9 kg)     GEN:  Well nourished, well developed in no acute distress HEENT: Normal NECK: No JVD; No carotid bruits LYMPHATICS: No lymphadenopathy CARDIAC: RRR, no murmurs, rubs, gallops RESPIRATORY:  Clear to auscultation without rales, wheezing or rhonchi  ABDOMEN: Soft, non-tender, non-distended MUSCULOSKELETAL:  No edema; No deformity  SKIN: Warm and dry NEUROLOGIC:  Alert and oriented x 3 PSYCHIATRIC:  Normal affect   ASSESSMENT:    1. PAF (paroxysmal atrial fibrillation) ([place]); CHA2DS2-VASc Score -7   2. Coronary artery disease involving native coronary artery of native heart  without angina pectoris   3. Primary hypertension   4. Hyperlipidemia LDL goal <70   5. Ischemic cardiomyopathy    PLAN:    In order of problems listed above:  PAF: Based on today's EKG, he has went back into atrial fibrillation.  I will start him on metoprolol tartrate 12.5 mg twice a day for rate control.  He has been on Eliquis.  I will stop the amlodipine to create enough blood pressure room.  Plan to have him see A-fib clinic in 2 weeks, if he is still in atrial fibrillation at that time, we may consider addition of amiodarone and cardioversion.  He was previously able to self convert, hopefully with the help or rate control medication, he will self convert again.  CAD: Denies any recent chest pain  Hypertension: Stop amlodipine and start on 12.5 mg twice a day of metoprolol  Hyperlipidemia: On Lipitor  Ischemic cardiomyopathy: Recent echocardiogram shows EF has normalized.           Medication Adjustments/Labs and Tests Ordered: Current medicines are reviewed at length with the patient today.  Concerns regarding medicines are outlined above.  Orders Placed This Encounter  Procedures   Amb Referral to AFIB Clinic  EKG 12-Lead   Meds ordered this encounter  Medications   metoprolol tartrate (LOPRESSOR) 25 MG tablet    Sig: Take 0.5 tablets (12.5 mg total) by mouth 2 (two) times daily.    Dispense:  135 tablet    Refill:  1   metoprolol tartrate (LOPRESSOR) 25 MG tablet    Sig: Take 0.5 tablets (12.5 mg total) by mouth 2 (two) times daily.    Dispense:  45 tablet    Refill:  0    Patient Instructions  Medication Instructions:  Your physician has recommended you make the following change in your medication STOP: Amlodipine '5mg'$  START: Metoprolol Tartrate 12.'5mg'$  TWICE DAILY (the medication will be a '25mg'$  tablet please take half a tablet twice daily).   *If you need a refill on your cardiac medications before your next appointment, please call your pharmacy*   Lab  Work: NONE If you have labs (blood work) drawn today and your tests are completely normal, you will receive your results only by: [facility] (if you have MyChart) OR A paper copy in the mail If you have any lab test that is abnormal or we need to change your treatment, we will call you to review the results.   Testing/Procedures: NONE   Follow-Up: At [facility], you and your health needs are our priority.  As part of our continuing mission to provide you with exceptional heart care, we have created designated Provider Care Teams.  These Care Teams include your primary Cardiologist (physician) and Advanced Practice Providers (APPs -  Physician Assistants and Nurse Practitioners) who all work together to provide you with the care you need, when you need it.  We recommend signing up for the patient portal called "MyChart".  Sign up information is provided on this After Visit Summary.  MyChart is used to connect with patients for Virtual Visits (Telemedicine).  Patients are able to view lab/test results, encounter notes, upcoming appointments, etc.  Non-urgent messages can be sent to your provider as well.   To learn more about what you can do with MyChart, go to [email or web address].    Your next appointment:   3-4 month(s)  The format for your next appointment:   In Person  Provider:   Glenetta Hew, MD     Signed, Almyra Deforest, [place]  01/30/2022 1:33 PM    [place]

## 2022-01-30 ENCOUNTER — Encounter: Payer: Self-pay | Admitting: Physician Assistant

## 2022-02-03 ENCOUNTER — Other Ambulatory Visit: Payer: Self-pay | Admitting: Physician Assistant

## 2022-02-03 DIAGNOSIS — I480 Paroxysmal atrial fibrillation: Secondary | ICD-10-CM

## 2022-02-03 NOTE — Telephone Encounter (Signed)
Eliquis 2.'5mg'$  refill request received. Patient is 88 years old, weight-86.6kg, Crea-1.76 on 01/14/2022, Diagnosis-Afib, and last seen by Almyra Deforest on 01/28/2022. Dose is appropriate based on dosing criteria. Will send in refill to requested pharmacy.

## 2022-02-15 ENCOUNTER — Other Ambulatory Visit: Payer: Self-pay

## 2022-02-15 ENCOUNTER — Inpatient Hospital Stay (HOSPITAL_BASED_OUTPATIENT_CLINIC_OR_DEPARTMENT_OTHER)
Admission: EM | Admit: 2022-02-15 | Discharge: 2022-02-23 | DRG: 246 | Disposition: A | Discharge status: Home / Self Care | Payer: Medicare Other | Attending: Internal Medicine | Admitting: Internal Medicine

## 2022-02-15 ENCOUNTER — Emergency Department (HOSPITAL_BASED_OUTPATIENT_CLINIC_OR_DEPARTMENT_OTHER): Payer: Medicare Other | Admitting: Radiology

## 2022-02-15 DIAGNOSIS — Z66 Do not resuscitate: Secondary | ICD-10-CM | POA: Diagnosis present

## 2022-02-15 DIAGNOSIS — N1832 Chronic kidney disease, stage 3b: Secondary | ICD-10-CM | POA: Diagnosis present

## 2022-02-15 DIAGNOSIS — Z20822 Contact with and (suspected) exposure to covid-19: Secondary | ICD-10-CM | POA: Diagnosis present

## 2022-02-15 DIAGNOSIS — I480 Paroxysmal atrial fibrillation: Secondary | ICD-10-CM | POA: Diagnosis present

## 2022-02-15 DIAGNOSIS — N400 Enlarged prostate without lower urinary tract symptoms: Secondary | ICD-10-CM | POA: Diagnosis present

## 2022-02-15 DIAGNOSIS — I493 Ventricular premature depolarization: Secondary | ICD-10-CM | POA: Diagnosis present

## 2022-02-15 DIAGNOSIS — I249 Acute ischemic heart disease, unspecified: Secondary | ICD-10-CM | POA: Diagnosis not present

## 2022-02-15 DIAGNOSIS — I2510 Atherosclerotic heart disease of native coronary artery without angina pectoris: Secondary | ICD-10-CM | POA: Diagnosis not present

## 2022-02-15 DIAGNOSIS — R062 Wheezing: Secondary | ICD-10-CM | POA: Diagnosis not present

## 2022-02-15 DIAGNOSIS — I255 Ischemic cardiomyopathy: Secondary | ICD-10-CM | POA: Diagnosis not present

## 2022-02-15 DIAGNOSIS — N179 Acute kidney failure, unspecified: Secondary | ICD-10-CM | POA: Diagnosis not present

## 2022-02-15 DIAGNOSIS — I214 Non-ST elevation (NSTEMI) myocardial infarction: Secondary | ICD-10-CM | POA: Diagnosis present

## 2022-02-15 DIAGNOSIS — I5033 Acute on chronic diastolic (congestive) heart failure: Secondary | ICD-10-CM | POA: Diagnosis not present

## 2022-02-15 DIAGNOSIS — Z79899 Other long term (current) drug therapy: Secondary | ICD-10-CM | POA: Diagnosis not present

## 2022-02-15 DIAGNOSIS — R0602 Shortness of breath: Secondary | ICD-10-CM | POA: Diagnosis not present

## 2022-02-15 DIAGNOSIS — Z7901 Long term (current) use of anticoagulants: Secondary | ICD-10-CM

## 2022-02-15 DIAGNOSIS — D539 Nutritional anemia, unspecified: Secondary | ICD-10-CM | POA: Diagnosis present

## 2022-02-15 DIAGNOSIS — E1122 Type 2 diabetes mellitus with diabetic chronic kidney disease: Secondary | ICD-10-CM | POA: Diagnosis present

## 2022-02-15 DIAGNOSIS — I252 Old myocardial infarction: Secondary | ICD-10-CM | POA: Diagnosis not present

## 2022-02-15 DIAGNOSIS — I25119 Atherosclerotic heart disease of native coronary artery with unspecified angina pectoris: Secondary | ICD-10-CM | POA: Diagnosis not present

## 2022-02-15 DIAGNOSIS — E785 Hyperlipidemia, unspecified: Secondary | ICD-10-CM | POA: Diagnosis present

## 2022-02-15 DIAGNOSIS — K5900 Constipation, unspecified: Secondary | ICD-10-CM | POA: Diagnosis present

## 2022-02-15 DIAGNOSIS — I5021 Acute systolic (congestive) heart failure: Secondary | ICD-10-CM

## 2022-02-15 DIAGNOSIS — Z8673 Personal history of transient ischemic attack (TIA), and cerebral infarction without residual deficits: Secondary | ICD-10-CM | POA: Diagnosis not present

## 2022-02-15 DIAGNOSIS — I5043 Acute on chronic combined systolic (congestive) and diastolic (congestive) heart failure: Secondary | ICD-10-CM | POA: Diagnosis present

## 2022-02-15 DIAGNOSIS — I5022 Chronic systolic (congestive) heart failure: Secondary | ICD-10-CM

## 2022-02-15 DIAGNOSIS — E7800 Pure hypercholesterolemia, unspecified: Secondary | ICD-10-CM | POA: Diagnosis not present

## 2022-02-15 DIAGNOSIS — I5023 Acute on chronic systolic (congestive) heart failure: Secondary | ICD-10-CM | POA: Diagnosis not present

## 2022-02-15 DIAGNOSIS — Z955 Presence of coronary angioplasty implant and graft: Secondary | ICD-10-CM | POA: Diagnosis not present

## 2022-02-15 DIAGNOSIS — I2584 Coronary atherosclerosis due to calcified coronary lesion: Secondary | ICD-10-CM | POA: Diagnosis present

## 2022-02-15 DIAGNOSIS — I10 Essential (primary) hypertension: Secondary | ICD-10-CM | POA: Diagnosis present

## 2022-02-15 DIAGNOSIS — I110 Hypertensive heart disease with heart failure: Secondary | ICD-10-CM | POA: Diagnosis not present

## 2022-02-15 DIAGNOSIS — Z91041 Radiographic dye allergy status: Secondary | ICD-10-CM | POA: Diagnosis not present

## 2022-02-15 DIAGNOSIS — J90 Pleural effusion, not elsewhere classified: Secondary | ICD-10-CM | POA: Diagnosis not present

## 2022-02-15 DIAGNOSIS — N183 Chronic kidney disease, stage 3 unspecified: Secondary | ICD-10-CM | POA: Diagnosis present

## 2022-02-15 DIAGNOSIS — I130 Hypertensive heart and chronic kidney disease with heart failure and stage 1 through stage 4 chronic kidney disease, or unspecified chronic kidney disease: Secondary | ICD-10-CM | POA: Diagnosis not present

## 2022-02-15 DIAGNOSIS — I509 Heart failure, unspecified: Secondary | ICD-10-CM

## 2022-02-15 LAB — COMPREHENSIVE METABOLIC PANEL
ALT: 14 U/L (ref 0–44)
AST: 17 U/L (ref 15–41)
Albumin: 4 g/dL (ref 3.5–5.0)
Alkaline Phosphatase: 92 U/L (ref 38–126)
Anion gap: 9 (ref 5–15)
BUN: 34 mg/dL — ABNORMAL HIGH (ref 8–23)
CO2: 22 mmol/L (ref 22–32)
Calcium: 8.5 mg/dL — ABNORMAL LOW (ref 8.9–10.3)
Chloride: 110 mmol/L (ref 98–111)
Creatinine, Ser: 1.68 mg/dL — ABNORMAL HIGH (ref 0.61–1.24)
GFR, Estimated: 39 mL/min — ABNORMAL LOW (ref >60)
Glucose, Bld: 160 mg/dL — ABNORMAL HIGH (ref 70–99)
Potassium: 4.5 mmol/L (ref 3.5–5.1)
Sodium: 141 mmol/L (ref 135–145)
Total Bilirubin: 0.8 mg/dL (ref 0.3–1.2)
Total Protein: 6.4 g/dL — ABNORMAL LOW (ref 6.5–8.1)

## 2022-02-15 LAB — CBC WITH DIFFERENTIAL/PLATELET
Abs Immature Granulocytes: 0.01 10*3/uL (ref 0.00–0.07)
Basophils Absolute: 0 10*3/uL (ref 0.0–0.1)
Basophils Relative: 0 %
Eosinophils Absolute: 0.2 10*3/uL (ref 0.0–0.5)
Eosinophils Relative: 3 %
HCT: 33.3 % — ABNORMAL LOW (ref 39.0–52.0)
Hemoglobin: 11.3 g/dL — ABNORMAL LOW (ref 13.0–17.0)
Immature Granulocytes: 0 %
Lymphocytes Relative: 14 %
Lymphs Abs: 1.1 10*3/uL (ref 0.7–4.0)
MCH: 34.9 pg — ABNORMAL HIGH (ref 26.0–34.0)
MCHC: 33.9 g/dL (ref 30.0–36.0)
MCV: 102.8 fL — ABNORMAL HIGH (ref 80.0–100.0)
Monocytes Absolute: 0.6 10*3/uL (ref 0.1–1.0)
Monocytes Relative: 7 %
Neutro Abs: 6.1 10*3/uL (ref 1.7–7.7)
Neutrophils Relative %: 76 %
Platelets: 191 10*3/uL (ref 150–400)
RBC: 3.24 MIL/uL — ABNORMAL LOW (ref 4.22–5.81)
RDW: 13.1 % (ref 11.5–15.5)
WBC: 8 10*3/uL (ref 4.0–10.5)
nRBC: 0 % (ref 0.0–0.2)

## 2022-02-15 LAB — BRAIN NATRIURETIC PEPTIDE: B Natriuretic Peptide: 1060.9 pg/mL — ABNORMAL HIGH (ref 0.0–100.0)

## 2022-02-15 LAB — TROPONIN I (HIGH SENSITIVITY)
Troponin I (High Sensitivity): 163 ng/L (ref <18)
Troponin I (High Sensitivity): 277 ng/L (ref <18)

## 2022-02-15 LAB — SARS CORONAVIRUS 2 BY RT PCR: SARS Coronavirus 2 by RT PCR: NEGATIVE

## 2022-02-15 MED ORDER — ACETAMINOPHEN 500 MG PO TABS: 500.0000 mg | ORAL_TABLET | Freq: Four times a day (QID) | ORAL | Status: DC | PRN

## 2022-02-15 MED ORDER — CLOPIDOGREL BISULFATE 75 MG PO TABS
75.0000 mg | ORAL_TABLET | Freq: Every day | ORAL | Status: DC
  Administered 2022-02-16 – 2022-02-23 (×8): 75 mg via ORAL
  Filled 2022-02-15 (×8): qty 1

## 2022-02-15 MED ORDER — ALLOPURINOL 100 MG PO TABS
100.0000 mg | ORAL_TABLET | Freq: Every day | ORAL | Status: DC
  Administered 2022-02-16 – 2022-02-23 (×7): 100 mg via ORAL
  Filled 2022-02-15 (×7): qty 1

## 2022-02-15 MED ORDER — NITROGLYCERIN 0.4 MG SL SUBL: 0.4000 mg | SUBLINGUAL_TABLET | SUBLINGUAL | Status: DC | PRN

## 2022-02-15 MED ORDER — MELATONIN 5 MG PO TABS
10.0000 mg | ORAL_TABLET | Freq: Every day | ORAL | Status: DC
  Administered 2022-02-16 – 2022-02-17 (×2): 10 mg via ORAL
  Filled 2022-02-15 (×4): qty 2

## 2022-02-15 MED ORDER — CLOPIDOGREL BISULFATE 300 MG PO TABS
600.0000 mg | ORAL_TABLET | Freq: Once | ORAL | Status: AC
  Administered 2022-02-16: 600 mg via ORAL
  Filled 2022-02-15: qty 2

## 2022-02-15 MED ORDER — ADULT MULTIVITAMIN W/MINERALS CH
1.0000 | ORAL_TABLET | Freq: Every day | ORAL | Status: DC
  Administered 2022-02-16 – 2022-02-23 (×7): 1 via ORAL
  Filled 2022-02-15 (×7): qty 1

## 2022-02-15 MED ORDER — VITAMIN B-12 1000 MCG PO TABS
5000.0000 ug | ORAL_TABLET | Freq: Every day | ORAL | Status: DC
  Administered 2022-02-16 – 2022-02-23 (×7): 5000 ug via ORAL
  Filled 2022-02-15 (×7): qty 5

## 2022-02-15 MED ORDER — IPRATROPIUM-ALBUTEROL 0.5-2.5 (3) MG/3ML IN SOLN
6.0000 mL | Freq: Once | RESPIRATORY_TRACT | Status: AC
  Administered 2022-02-15: 6 mL via RESPIRATORY_TRACT
  Filled 2022-02-15: qty 3

## 2022-02-15 MED ORDER — ASPIRIN 325 MG PO TBEC
325.0000 mg | DELAYED_RELEASE_TABLET | Freq: Once | ORAL | Status: AC
  Administered 2022-02-15: 325 mg via ORAL
  Filled 2022-02-15: qty 1

## 2022-02-15 MED ORDER — FLUTICASONE PROPIONATE 50 MCG/ACT NA SUSP
1.0000 | Freq: Every day | NASAL | Status: DC
  Administered 2022-02-16 – 2022-02-23 (×6): 1 via NASAL
  Filled 2022-02-15 (×2): qty 16

## 2022-02-15 MED ORDER — ASPIRIN 81 MG PO TBEC
81.0000 mg | DELAYED_RELEASE_TABLET | Freq: Every day | ORAL | Status: DC
  Administered 2022-02-16: 81 mg via ORAL
  Filled 2022-02-15: qty 1

## 2022-02-15 MED ORDER — TERAZOSIN HCL 1 MG PO CAPS
4.0000 mg | ORAL_CAPSULE | Freq: Every evening | ORAL | Status: DC
  Administered 2022-02-16 – 2022-02-22 (×7): 4 mg via ORAL
  Filled 2022-02-15 (×8): qty 4

## 2022-02-15 MED ORDER — FUROSEMIDE 10 MG/ML IJ SOLN
40.0000 mg | Freq: Once | INTRAMUSCULAR | Status: AC
Start: 2022-02-15 — End: 2022-02-15
  Administered 2022-02-15: 40 mg via INTRAVENOUS
  Filled 2022-02-15: qty 4

## 2022-02-15 MED ORDER — ALBUTEROL SULFATE HFA 108 (90 BASE) MCG/ACT IN AERS: 2.0000 | INHALATION_SPRAY | RESPIRATORY_TRACT | Status: DC | PRN

## 2022-02-15 MED ORDER — ATORVASTATIN CALCIUM 40 MG PO TABS
40.0000 mg | ORAL_TABLET | Freq: Every day | ORAL | Status: DC
  Administered 2022-02-16 – 2022-02-22 (×8): 40 mg via ORAL
  Filled 2022-02-15 (×8): qty 1

## 2022-02-15 MED ORDER — METOPROLOL TARTRATE 12.5 MG HALF TABLET
12.5000 mg | ORAL_TABLET | Freq: Two times a day (BID) | ORAL | Status: DC
  Administered 2022-02-16 – 2022-02-23 (×14): 12.5 mg via ORAL
  Filled 2022-02-15 (×15): qty 1

## 2022-02-15 MED ORDER — IPRATROPIUM-ALBUTEROL 0.5-2.5 (3) MG/3ML IN SOLN
3.0000 mL | Freq: Once | RESPIRATORY_TRACT | Status: AC
  Administered 2022-02-15: 3 mL via RESPIRATORY_TRACT
  Filled 2022-02-15: qty 3

## 2022-02-15 MED ORDER — ACETAMINOPHEN 325 MG PO TABS: 650.0000 mg | ORAL_TABLET | ORAL | Status: DC | PRN

## 2022-02-15 MED ORDER — HEPARIN (PORCINE) 25000 UT/250ML-% IV SOLN
1150.0000 [IU]/h | INTRAVENOUS | Status: DC
  Administered 2022-02-15: 1000 [IU]/h via INTRAVENOUS
  Administered 2022-02-16 – 2022-02-18 (×3): 1150 [IU]/h via INTRAVENOUS
  Filled 2022-02-15 (×4): qty 250

## 2022-02-15 NOTE — Progress Notes (Signed)
ANTICOAGULATION CONSULT NOTE - Initial Consult  Pharmacy Consult for heparin Indication: chest pain/ACS  Allergies  Allergen Reactions   Other Shortness Of Breath    Esters   Contrast Media [Iodinated Contrast Media] Other (See Comments)    Just feel bad all over   Morphine And Related Other (See Comments)    Patient states he does not like to take Morphine as it gives him nightmares and hallucinations.     Patient Measurements:   Heparin Dosing Weight: 80kg  Vital Signs: Temp: 98.1 F (36.7 C) (08/19 1326) BP: 134/69 (08/19 1500) Pulse Rate: 63 (08/19 1500)  Labs: Recent Labs    02/15/22 1406  HGB 11.3*  HCT 33.3*  PLT 191  CREATININE 1.68*  TROPONINIHS 163*    CrCl cannot be calculated (Unknown ideal weight.).   Medical History: Past Medical History:  Diagnosis Date   First detected episode of atrial fibrillation ([place]) 11/29/2017   In setting of STEMI -- NO further documented episodes   Hypercholesteremia    Hypertension    Multiple vessel coronary artery disease     Cath 11/29/17 - 3 V CAD (CTO RCA), 95% m-d Cx (@ OM2 55%)-> PCI Cx-PTCA Om2; 85% p-m LAD => DES PCI    Myocardial infarction of inferolateral wall ([place]) 11/2017   ? prior Infarct; - Cath 11/29/17 - 3 V CAD (CTO RCA), 95% m-d Cx (@ OM2 55%)-> PCI Cx-PTCA Om2; 85% p-m LAD => DES PCI    Narcolepsy    pt may pass out when laughing   Spinal stenosis    TIA (transient ischemic attack)     Assessment: 45 YOM presenting with SOB, hx CAD and elevated troponin, he is on Eliquis PTA for afib, chronic anemia stable, plts 191  Goal of Therapy:  Heparin level 0.3-0.7 units/ml aPTT 66-102 seconds Monitor platelets by anticoagulation protocol: Yes   Plan:  Heparin gtt at 1000 units/hr, no bolus F/u 8 hour aPTT/HL F/u cards eval and recs  Bertis Ruddy, PharmD Clinical Pharmacist ED Pharmacist Phone # [phone] 02/15/2022 3:26 PM

## 2022-02-15 NOTE — ED Triage Notes (Addendum)
Patient arrives with complaints of shortness of breath and wheezing x3 days. Patient reports no chest pain. Family is concerned about PE after a four hour drive. Negative home covid test.   Hx of MI with stents and atrial fib, and kidney insufficiency. Recently placed on metoprolol and Eliquis.

## 2022-02-15 NOTE — ED Provider Notes (Signed)
[facility] Provider Note   CSN: [id number] Arrival date & time: 02/15/22  1311     History  Chief Complaint  Patient presents with   Shortness of Breath   Wheezing    Paul Kramer is a 88 y.o. male.  88 year old male with past medical history significant for CAD status post PCI, CHF, A-fib on chronic anticoagulation not on maintenance diuretic presents today for evaluation of shortness of breath.  Denies chest pain, lightheadedness, palpitations.  Patient went on a road trip which was 4 hours but took a break halfway through both on the way to the beach, and on the way back.  Denies any worsening leg swelling, leg pain.  No prior history of PE.  He has wheezing.  Symptoms started while he was on the beach and have persisted until he came back.  History of MI in 2019.  The history is provided by the patient. No language interpreter was used.       Home Medications Prior to Admission medications   Medication Sig Start Date End Date Taking? Authorizing Provider  acetaminophen (TYLENOL) 500 MG tablet Take 500 mg by mouth every 6 (six) hours as needed.    [provider]  allopurinol (ZYLOPRIM) 100 MG tablet Take 100 mg by mouth every morning. 10/08/17   [provider]  amoxicillin-clavulanate (AUGMENTIN) 875-125 MG tablet Take 1 tablet by mouth every 12 (twelve) hours. 12/14/20   Hazel Sams, PA-C  apixaban (ELIQUIS) 2.5 MG TABS tablet TAKE 1 TABLET BY MOUTH TWICE A DAY 02/03/22   Almyra Deforest, PA  atorvastatin (LIPITOR) 40 MG tablet TAKE 1 TABLET DAILY AT 6 P.M. 10/25/21   Leonie Man, MD  Cyanocobalamin (VITAMIN B-12) 5000 MCG TBDP Take 1 tablet by mouth daily.    [provider]  fluticasone (FLONASE) 50 MCG/ACT nasal spray Place into both nostrils daily.    [provider]  furosemide (LASIX) 20 MG tablet Take 1 tablet by mouth daily for the next 5 days, Then take as needed for leg edema. 01/07/22   Almyra Deforest, PA   loratadine (CLARITIN) 10 MG tablet Take 10 mg by mouth daily.    [provider]  Melatonin 10 MG CAPS Take 10 mg by mouth daily.    [provider]  metoprolol tartrate (LOPRESSOR) 25 MG tablet Take 0.5 tablets (12.5 mg total) by mouth 2 (two) times daily. 01/28/22   Almyra Deforest, PA  metoprolol tartrate (LOPRESSOR) 25 MG tablet Take 0.5 tablets (12.5 mg total) by mouth 2 (two) times daily. 01/28/22   Almyra Deforest, PA  Multiple Vitamins-Minerals (CENTRUM SILVER PO) Take 1 tablet by mouth daily.    [provider]  nitroGLYCERIN (NITROSTAT) 0.4 MG SL tablet DISSOLVE ONE TABLET UNDER THE TONGUE EVERY 5 MINUTES AS NEEDED FOR CHEST PAIN.  DO NOT EXCEED A TOTAL OF 3 DOSES IN 15 MINUTES 08/09/19   Bhagat, Bhavinkumar, PA  Psyllium (METAMUCIL FIBER PO) Take by mouth.    [provider]  terazosin (HYTRIN) 2 MG capsule Take 4 mg by mouth every evening. 10/08/17   [provider]  VITAMIN D, ERGOCALCIFEROL, PO Take 5,000 Units by mouth daily.     [provider]      Allergies    Other, Contrast media [iodinated contrast media], and Morphine and related    Review of Systems   Review of Systems  Constitutional:  Negative for chills and fever.  Respiratory:  Positive for shortness of breath.  Negative for cough.   Cardiovascular:  Positive for leg swelling. Negative for chest pain and palpitations.  Gastrointestinal:  Negative for nausea and vomiting.  Neurological:  Negative for syncope and light-headedness.  All other systems reviewed and are negative.   Physical Exam Updated Vital Signs BP (!) 158/72   Pulse 71   Temp 98.1 F (36.7 C)   Resp (!) 25   SpO2 93%  Physical Exam Vitals and nursing note reviewed.  Constitutional:      General: He is not in acute distress.    Appearance: Normal appearance. He is not ill-appearing.  HENT:     Head: Normocephalic and atraumatic.     Nose: Nose normal.  Eyes:     General: No scleral icterus.     Extraocular Movements: Extraocular movements intact.     Conjunctiva/sclera: Conjunctivae normal.  Cardiovascular:     Rate and Rhythm: Normal rate and regular rhythm.     Pulses: Normal pulses.     Heart sounds: Normal heart sounds.  Pulmonary:     Effort: Pulmonary effort is normal. No respiratory distress.     Breath sounds: Wheezing present. No rales.  Abdominal:     General: There is no distension.     Tenderness: There is no abdominal tenderness.  Musculoskeletal:        General: Normal range of motion.     Cervical back: Normal range of motion.     Right lower leg: Edema (2+pitting edema) present.     Left lower leg: Edema (2+ pitting edema) present.  Skin:    General: Skin is warm and dry.  Neurological:     General: No focal deficit present.     Mental Status: He is alert. Mental status is at baseline.     ED Results / Procedures / Treatments   Labs (all labs ordered are listed, but only abnormal results are displayed) Labs Reviewed  CBC WITH DIFFERENTIAL/PLATELET  COMPREHENSIVE METABOLIC PANEL  BRAIN NATRIURETIC PEPTIDE  TROPONIN I (HIGH SENSITIVITY)    EKG None  Radiology No results found.  Procedures .Critical Care  Performed by: Evlyn Courier, PA-C Authorized by: Evlyn Courier, PA-C   Critical care provider statement:    Critical care time (minutes):  35   Critical care was necessary to treat or prevent imminent or life-threatening deterioration of the following conditions:  Circulatory failure   Critical care was time spent personally by me on the following activities:  Development of treatment plan with patient or surrogate, discussions with consultants, evaluation of patient's response to treatment, examination of patient, ordering and review of laboratory studies, ordering and review of radiographic studies, ordering and performing treatments and interventions, pulse oximetry, re-evaluation of patient's condition and review of old charts     Medications  Ordered in ED Medications  albuterol (VENTOLIN HFA) 108 (90 Base) MCG/ACT inhaler 2 puff (has no administration in time range)  ipratropium-albuterol (DUONEB) 0.5-2.5 (3) MG/3ML nebulizer solution 6 mL (has no administration in time range)    ED Course/ Medical Decision Making/ A&P Clinical Course as of 02/15/22 1831  Sat Feb 15, 2022  1518 Patient's work-up reveals CBC without leukocytosis, mild anemia of 11.3 which is around his baseline.  CMP shows creatinine of 1.68 which is around his baseline otherwise without acute findings.  BNP of 1060, initial troponin of 163.  Will place patient on heparin drip, give patient dose of aspirin, give patient Lasix IVP 40 mg.   Patient on reevaluation is without  wheezing.  Reports improvement in his respiratory status.  Will discuss with cardiology. [AA]  1710 Anion gap: 9 [AA]    Clinical Course User Index [AA] Evlyn Courier, PA-C                           Medical Decision Making Amount and/or Complexity of Data Reviewed Labs: ordered. Decision-making details documented in ED Course. Radiology: ordered.  Risk OTC drugs. Prescription drug management. Decision regarding hospitalization.    Medical Decision Making / ED Course   This patient presents to the ED for concern of shortness of breath, this involves an extensive number of treatment options, and is a complaint that carries with it a high risk of complications and morbidity.  The differential diagnosis includes ACS, PE, pneumonia, CHF exacerbation  MDM: 88 year old male with past medical history as listed above presents for evaluation of shortness of breath and wheezing.  Ongoing for about 3 to 4 days.  Patient has history of CHF not on maintenance diuretic.  Has bilateral symmetrical peripheral edema which son who is at bedside states is slightly improved from his baseline.  Given his cardiac history will evaluate with blood work including BNP, troponin, COVID.  Will evaluate with chest  x-ray.  Will give back-to-back DuoNeb treatments.  Low suspicion for PE as peripheral edema is symmetrical, he is without tachycardia, tachypnea, or hypoxia.  He is compliant with his anticoagulation.  He is also without chest pain. Chest x-ray shows evidence of infiltrate/edema.  Most likely edema given that BNP is elevated.  Troponin is also elevated.  He remains without chest pain.  He also did not have chest pain with his prior MI.  Likely this is his anginal equivalent.  IVP 40 mg Lasix given.  Case discussed with Dr. Conley Canal of cardiology service who will evaluate patient to cardiology service.  Findings and plan discussed with son who is at bedside who voices understanding and is in agreement with plan.   Lab Tests: -I ordered, reviewed, and interpreted labs.   The pertinent results include:   Labs Reviewed  CBC WITH DIFFERENTIAL/PLATELET - Abnormal; Notable for the following components:      Result Value   RBC 3.24 (*)    Hemoglobin 11.3 (*)    HCT 33.3 (*)    MCV 102.8 (*)    MCH 34.9 (*)    All other components within normal limits  COMPREHENSIVE METABOLIC PANEL - Abnormal; Notable for the following components:   Glucose, Bld 160 (*)    BUN 34 (*)    Creatinine, Ser 1.68 (*)    Calcium 8.5 (*)    Total Protein 6.4 (*)    GFR, Estimated 39 (*)    All other components within normal limits  BRAIN NATRIURETIC PEPTIDE - Abnormal; Notable for the following components:   B Natriuretic Peptide 1,060.9 (*)    All other components within normal limits  TROPONIN I (HIGH SENSITIVITY) - Abnormal; Notable for the following components:   Troponin I (High Sensitivity) 163 (*)    All other components within normal limits  TROPONIN I (HIGH SENSITIVITY) - Abnormal; Notable for the following components:   Troponin I (High Sensitivity) 277 (*)    All other components within normal limits  SARS CORONAVIRUS 2 BY RT PCR  HEPARIN LEVEL (UNFRACTIONATED)  APTT      EKG  EKG  Interpretation  Date/Time:    Ventricular Rate:    PR Interval:  QRS Duration:   QT Interval:    QTC Calculation:   R Axis:     Text Interpretation:           Imaging Studies ordered: I ordered imaging studies including CXR I independently visualized and interpreted imaging. I agree with the radiologist interpretation   Medicines ordered and prescription drug management: Meds ordered this encounter  Medications   DISCONTD: albuterol (VENTOLIN HFA) 108 (90 Base) MCG/ACT inhaler 2 puff   ipratropium-albuterol (DUONEB) 0.5-2.5 (3) MG/3ML nebulizer solution 6 mL   aspirin EC tablet 325 mg   furosemide (LASIX) injection 40 mg   heparin ADULT infusion 100 units/mL (25000 units/254m)   ipratropium-albuterol (DUONEB) 0.5-2.5 (3) MG/3ML nebulizer solution 3 mL    -I have reviewed the patients home medicines and have made adjustments as needed  Reevaluation: After the interventions noted above, I reevaluated the patient and found that they have :improved  Co morbidities that complicate the patient evaluation  Past Medical History:  Diagnosis Date   [place]   In setting of STEMI -- NO further documented episodes   Hypercholesteremia    Hypertension    Multiple vessel coronary artery disease     Cath 11/29/17 - 3 V CAD (CTO RCA), 95% m-d Cx (@ OM2 55%)-> PCI Cx-PTCA Om2; 85% p-m LAD => DES PCI    [place] [place] [place]   [place] prior Infarct; - Cath 11/29/17 - 3 V CAD (CTO RCA), 95% m-d Cx (@ OM2 55%)-> PCI Cx-PTCA Om2; 85% p-m LAD => DES PCI    Narcolepsy    pt may pass out when laughing   Spinal stenosis    TIA (transient ischemic attack)       Dispostion: Patient [facility]    Final Clinical Impression(s) / ED Diagnoses Final diagnoses:  Acute congestive heart failure, unspecified heart failure type ([facility]   ACS (acute coronary syndrome) ([facility]    Rx / DC Orders ED Discharge Orders     None         AEvlyn Courier PA-C 02/15/22 1841    NVarney Biles MD 02/17/22 1254

## 2022-02-15 NOTE — ED Notes (Signed)
CRITICAL VALUE STICKER  CRITICAL VALUE:Troponin 758  VEXOGACG (on-site recipient of call):Shawnie Pons, RN  DATE & TIME NOTIFIED:   MESSENGER (representative from lab):  MD NOTIFIED: P.A. Ali  TIME OF NOTIFICATION:  RESPONSE:

## 2022-02-15 NOTE — ED Notes (Signed)
CRITICAL VALUE STICKER  CRITICAL VALUE:Troponin 734  KAJGOTLX (on-site recipient of call):Shawnie Pons, RN  DATE & TIME NOTIFIED:   MESSENGER (representative from lab):  MD NOTIFIED: P.A. Ali  TIME OF NOTIFICATION:1743   RESPONSE:

## 2022-02-15 NOTE — H&P (Incomplete)
Cardiology Admission History and Physical:   Patient ID: Paul Kramer MRN: [id number]; DOB: 18-Jan-1930   Admission date: 02/15/2022  PCP:  Lajean Manes, MD   [facility] Providers Cardiologist:  Glenetta Hew, MD   { Click here to update MD or APP on Care Team, Refresh:1}     Chief Complaint:  SOB  Patient Profile:   Paul Kramer is a 88 y.o. male with a hx of multivessel CAD w/ STEMI s/p PCI x2 to Lcx and LAD (2019), HFpEF (EF = 51%), HTN, HLD, PAF (on Eliquis), CKD3 (bl sCr 1.6-1.9), and BPH who is being admitted 02/15/2022 for the evaluation of NSTEMI.  History of Present Illness:   Paul Kramer reports that he was in his usual state of health until 3 days ago when he developed SOB and wheezing while [place] the beach.  During this time he also noted increased swelling in his bilateral lower extremities more than usual.  He denies having any chest pain, diaphoresis, fevers, N/V/D, abdominal pain, PND, orthopnea, weakness, numbness, or bleeding.  His symptoms persisted prompting him to present to the ED for evaluation.  Of note the patient suffered a STEMI in 2019 for which she underwent LHC and PCI x2 to the circumflex and LAD.  He also notably had a CTO to his RCA and was evaluated by CT surgery but deemed not to be a good CABG candidate.  His cath was complicated by a RU E hematoma, but was subsequently discharged successfully.  He has remained on apixaban 2.5 mg twice daily monotherapy after developing paroxysmal atrial fibrillation later on.  He has been doing well until this current presentation.  In the ED his VS were afebrile, HR 67, BP 149/67, RR 26 and satting 92% on RA.  He was given 2L [place] for comfort.  Labs notable for troponin 163 -> 277, BNP 1060 and creatinine 1.68.  CXR notable for mild diffuse bilateral interstitial opacities and small bilateral pleural effusions.  ECG showed TWI's in the lateral leads without ST elevations.  Given his NSTEMI was transferred to  [facility] for management.  At the time my examination the patient was chest pain free and denied having shortness of breath.   Past Medical History:  Diagnosis Date  . First detected episode of atrial fibrillation ([place]) 11/29/2017   In setting of STEMI -- NO further documented episodes  . Hypercholesteremia   . Hypertension   . Multiple vessel coronary artery disease     Cath 11/29/17 - 3 V CAD (CTO RCA), 95% m-d Cx (@ OM2 55%)-> PCI Cx-PTCA Om2; 85% p-m LAD => DES PCI   . Myocardial infarction of inferolateral wall ([place]) 11/2017   ? prior Infarct; - Cath 11/29/17 - 3 V CAD (CTO RCA), 95% m-d Cx (@ OM2 55%)-> PCI Cx-PTCA Om2; 85% p-m LAD => DES PCI   . Narcolepsy    pt may pass out when laughing  . Spinal stenosis   . TIA (transient ischemic attack)     Past Surgical History:  Procedure Laterality Date  . CORONARY BALLOON ANGIOPLASTY N/A 11/29/2017   Procedure: CORONARY BALLOON ANGIOPLASTY;  Surgeon: Leonie Man, MD;  Location: [facility];  Service: Cardiovascular;;  - PTCA of RCA - unable to cross -- LIKELY CTO.  Marland Kitchen CORONARY STENT INTERVENTION N/A 12/07/2017   Procedure: CORONARY STENT INTERVENTION;  Surgeon: Leonie Man, MD;  Location: [facility];  Service: Cardiovascular; Mid-dist LCx 95% with 55% OM2 =>  Scoring PTCA of OM2 (20%), LCx PCI - Synergy DES 3 x 16 (3.3 mm); prox LAD 85% PCI- Synergy DES 3 x 16 (3.6 mm).  . LEFT HEART CATH N/A 12/07/2017   Procedure: Left Heart Cath;  Surgeon: Leonie Man, MD;  Location: [facility];  Service: Cardiovascular;  Laterality: N/A;  . LEFT HEART CATH AND CORONARY ANGIOGRAPHY N/A 11/29/2017   Procedure: LEFT HEART CATH AND CORONARY ANGIOGRAPHY;  Surgeon: Leonie Man, MD;  Location: [facility];; mRCA CTO, p-m LAD 85%, m-dCx 95% @ OM2 55%. Mod High LVEDP   . LIPOMA EXCISION    . SPINAL FIXATION SURGERY    . TRANSTHORACIC ECHOCARDIOGRAM  11/30/2017   EF 45-50%.  Inferior hypokinesis.  Elevated  filling pressures.  Mild to moderate MR.      Medications Prior to Admission: Prior to Admission medications   Medication Sig Start Date End Date Taking? Authorizing Provider  acetaminophen (TYLENOL) 500 MG tablet Take 500 mg by mouth every 6 (six) hours as needed.    [provider]  allopurinol (ZYLOPRIM) 100 MG tablet Take 100 mg by mouth every morning. 10/08/17   [provider]  amoxicillin-clavulanate (AUGMENTIN) 875-125 MG tablet Take 1 tablet by mouth every 12 (twelve) hours. 12/14/20   Hazel Sams, PA-C  apixaban (ELIQUIS) 2.5 MG TABS tablet TAKE 1 TABLET BY MOUTH TWICE A DAY 02/03/22   Almyra Deforest, PA  atorvastatin (LIPITOR) 40 MG tablet TAKE 1 TABLET DAILY AT 6 P.M. 10/25/21   Leonie Man, MD  Cyanocobalamin (VITAMIN B-12) 5000 MCG TBDP Take 1 tablet by mouth daily.    [provider]  fluticasone (FLONASE) 50 MCG/ACT nasal spray Place into both nostrils daily.    [provider]  furosemide (LASIX) 20 MG tablet Take 1 tablet by mouth daily for the next 5 days, Then take as needed for leg edema. 01/07/22   Almyra Deforest, PA  loratadine (CLARITIN) 10 MG tablet Take 10 mg by mouth daily.    [provider]  Melatonin 10 MG CAPS Take 10 mg by mouth daily.    [provider]  metoprolol tartrate (LOPRESSOR) 25 MG tablet Take 0.5 tablets (12.5 mg total) by mouth 2 (two) times daily. 01/28/22   Almyra Deforest, PA  metoprolol tartrate (LOPRESSOR) 25 MG tablet Take 0.5 tablets (12.5 mg total) by mouth 2 (two) times daily. 01/28/22   Almyra Deforest, PA  Multiple Vitamins-Minerals (CENTRUM SILVER PO) Take 1 tablet by mouth daily.    [provider]  nitroGLYCERIN (NITROSTAT) 0.4 MG SL tablet DISSOLVE ONE TABLET UNDER THE TONGUE EVERY 5 MINUTES AS NEEDED FOR CHEST PAIN.  DO NOT EXCEED A TOTAL OF 3 DOSES IN 15 MINUTES 08/09/19   Bhagat, Bhavinkumar, PA  Psyllium (METAMUCIL FIBER PO) Take by mouth.    [provider]  terazosin (HYTRIN) 2 MG  capsule Take 4 mg by mouth every evening. 10/08/17   [provider]  VITAMIN D, ERGOCALCIFEROL, PO Take 5,000 Units by mouth daily.     [provider]     Allergies:    Allergies  Allergen Reactions  . Other Shortness Of Breath    Esters  . Contrast Media [Iodinated Contrast Media] Other (See Comments)    Just feel bad all over  . Morphine And Related Other (See Comments)    Patient states he does not like to take Morphine as it gives him nightmares and hallucinations.     Social  History:   Social History   Socioeconomic History  . Marital status: Unknown    Spouse name: Not on file  . Number of children: Not on file  . Years of education: Not on file  . Highest education level: Not on file  Occupational History  . Occupation: retired  Tobacco Use  . Smoking status: Former  . Smokeless tobacco: Never  Vaping Use  . Vaping Use: Not on file  Substance and Sexual Activity  . Alcohol use: Yes    Comment: rare  . Drug use: Never  . Sexual activity: Not on file  Other Topics Concern  . Not on file  Social History Narrative  . Not on file   Social Determinants of Health   Financial Resource Strain: Low Risk  (02/04/2018)   Overall Financial Resource Strain (CARDIA)   . Difficulty of Paying Living Expenses: Not hard at all  Food Insecurity: No Food Insecurity (02/04/2018)   Hunger Vital Sign   . Worried About Charity fundraiser in the Last Year: Never true   . Ran Out of Food in the Last Year: Never true  Transportation Needs: No Transportation Needs (02/04/2018)   PRAPARE - Transportation   . Lack of Transportation (Medical): No   . Lack of Transportation (Non-Medical): No  Physical Activity: Inactive (02/04/2018)   Exercise Vital Sign   . Days of Exercise per Week: 0 days   . Minutes of Exercise per Session: 0 min  Stress: No Stress Concern Present (02/04/2018)   [place]   . Feeling of  Stress : Only a little  Social Connections: Not on file  Intimate Partner Violence: Not on file    Family History:   The patient's Family history is unknown by patient.    ROS:  Please see the history of present illness.  All other ROS reviewed and negative.     Physical Exam/Data:   Vitals:   02/15/22 2103 02/15/22 2200 02/15/22 2257 02/15/22 2308  BP:  134/60    Pulse:  (!) 59    Resp:  17    Temp: 98.5 F (36.9 C) 97.8 F (36.6 C)    TempSrc:  Oral    SpO2:  96%  97%  Weight:   85.9 kg   Height:   '5\' 6"'$  (1.676 m)     Intake/Output Summary (Last 24 hours) at 02/15/2022 2350 Last data filed at 02/15/2022 1847 Gross per 24 hour  Intake --  Output 800 ml  Net -800 ml      02/15/2022   10:57 PM 01/28/2022    9:00 AM 01/07/2022    2:27 PM  Last 3 Weights  Weight (lbs) 189 lb 6 oz 191 lb 193 lb  Weight (kg) 85.9 kg 86.637 kg 87.544 kg     Body mass index is 30.57 kg/m.  Kramer:  Well nourished, well developed, in no acute distress, very pleasant HEENT: normal Neck: JVD elevated to tragus Vascular: Distal pulses 2+ bilaterally   Cardiac:  normal S1, S2; RRR; no murmur, rubs or gallops Lungs:  clear to auscultation bilaterally, no wheezing, rhonchi or rales  Abd: soft, nontender, no hepatomegaly  Ext: 1+ pitting edema bilaterally L>R Musculoskeletal:  No deformities, BUE and BLE strength normal and equal Skin: warm and dry  Neuro:  CNs 2-12 intact, no focal abnormalities noted Psych:  Normal affect    EKG:     Relevant CV Studies:  TTE 01/20/22:  IMPRESSIONS     1. Left ventricular ejection fraction by 3D volume is 51 %. The left  ventricle has low normal function. The left ventricle has no regional wall  motion abnormalities. Left ventricular diastolic parameters are consistent  with Grade II diastolic  dysfunction (pseudonormalization).   2. Right ventricular systolic function is normal. The right ventricular  size is normal.   3. Left atrial size was  moderately dilated.   4. The mitral valve is normal in structure. Mild to moderate mitral valve  regurgitation. No evidence of mitral stenosis.   5. The aortic valve is tricuspid. The right cusp appears to be  restrictive in motion. Aortic valve regurgitation is moderate. Mild aortic  valve stenosis. Aortic regurgitation PHT measures 547 msec.   6. There is mild dilatation of the aortic root, measuring 37 mm. There is  moderate dilatation of the ascending aorta, measuring 43 mm.   7. The inferior vena cava is normal in size with greater than 50%  respiratory variability, suggesting right atrial pressure of 3 mmHg.  LHC 12/07/17:  LESION #1 & 1.5: Mid Cx to Dist Cx lesion is 95% stenosed with 55% stenosed side branch in Ost 2nd Mrg. Scoring balloon angioplasty was performed on the side branch using a BALLOON WOLVERINE 2.50X10. Post intervention, the side branch was reduced to 20% residual stenosis. A drug-eluting stent was successfully placed in the main Circumflex using a STENT SYNERGY DES 3X16. -Postdilated to 3.3 mm Post intervention, there is a 0% residual stenosis. LESION #2: Prox LAD lesion is 85% stenosed. A drug-eluting stent was successfully placed using a STENT SYNERGY DES 3X16. - Post-dilated to 3.6 mm Post intervention, there is a 0% residual stenosis. _______________________________________________ LV end diastolic pressure is moderately elevated.   Successful DES PCI of mid-dist Cx across 2nd Mrg & AVG Cx -Synergy DES 3.0 mm x 16 mm (3.3 mm) with Cutting Balloon PTCA of ostial 2nd Mrg using a 2.5 mm Wolverine balloon. Successful PCI of Ost LAD to Prox LAD lesion is 85% stenosed.-With a Synergy DES 3.0 mm x 16 mm postdilated to 3.6 mm  Moderately Elevated LVEDP    Laboratory Data:  High Sensitivity Troponin:   Recent Labs  Lab 02/15/22 1406 02/15/22 1635  TROPONINIHS 163* 277*      Chemistry Recent Labs  Lab 02/15/22 1406  NA 141  K 4.5  CL 110  CO2 22   GLUCOSE 160*  BUN 34*  CREATININE 1.68*  CALCIUM 8.5*  GFRNONAA 39*  ANIONGAP 9    Recent Labs  Lab 02/15/22 1406  PROT 6.4*  ALBUMIN 4.0  AST 17  ALT 14  ALKPHOS 92  BILITOT 0.8   Lipids No results for input(s): "CHOL", "TRIG", "HDL", "LABVLDL", "LDLCALC", "CHOLHDL" in the last 168 hours. Hematology Recent Labs  Lab 02/15/22 1406  WBC 8.0  RBC 3.24*  HGB 11.3*  HCT 33.3*  MCV 102.8*  MCH 34.9*  MCHC 33.9  RDW 13.1  PLT 191   Thyroid No results for input(s): "TSH", "FREET4" in the last 168 hours. BNP Recent Labs  Lab 02/15/22 1407  BNP 1,060.9*    DDimer No results for input(s): "DDIMER" in the last 168 hours.   Radiology/Studies:  DG Chest 2 View  Result Date: 02/15/2022 CLINICAL DATA:  Shortness of breath, wheezing EXAM: CHEST - 2 VIEW COMPARISON:  12/03/2017 FINDINGS: The heart size and mediastinal contours are within normal limits. Small, benign calcified left hilar lymph nodes. Mild, diffuse bilateral interstitial opacity and  small bilateral pleural effusions. The visualized skeletal structures are unremarkable. IMPRESSION: Mild, diffuse bilateral interstitial opacity and small bilateral pleural effusions, findings consistent with edema or infection. No focal airspace opacity. Electronically Signed   By: Delanna Ahmadi M.D.   On: 02/15/2022 14:05     Assessment and Plan:   Paul Kramer is a 88 y.o. male with a hx of multivessel CAD w/ STEMI s/p PCI x2 to Lcx and LAD (2019), HFpEF (EF = 51%), HTN, HLD, PAF (on Eliquis), CKD3 (bl sCr 1.6-1.9), and BPH who is being admitted 02/15/2022 for the evaluation of NSTEMI.  #NSTEMI #Multivessel CAD :: Patient presenting with SOB found to have elevated troponins and EKG changes consistent with an NSTEMI.   Risk Assessment/Risk Scores:  {Complete the following score calculators/questions to meet required metrics.  Press F2:1}  {Is the patient being seen for CHEST PAIN, UNSTABLE ANGINA, NSTEMI or STEMI?      :[id number]} {Does this patient have CHF or CHF symptoms?      :[id number]} {Does this patient have ATRIAL FIBRILLATION?:[phone]}   Severity of Illness: {Observation/Inpatient:21159}   For questions or updates, please contact [place] Please consult [email or web address] for contact info under     Signed, Hershal Coria, MD  02/15/2022 11:50 PM

## 2022-02-15 NOTE — H&P (Signed)
Cardiology Admission History and Physical:   Patient ID: Paul Kramer MRN: [id number]; DOB: [birth year]   Admission date: 02/15/2022  PCP:  Lajean Manes, MD   [facility] Providers Cardiologist:  Glenetta Hew, MD   { Click here to update MD or APP on Care Team, Refresh:1}     Chief Complaint:  ***  Patient Profile:   Paul Kramer is a 88 y.o. male with *** who is being seen 02/15/2022 for the evaluation of ***.  History of Present Illness:   Paul Kramer ***   Past Medical History:  Diagnosis Date   First detected episode of atrial fibrillation ([place]) 11/29/2017   In setting of STEMI -- NO further documented episodes   Hypercholesteremia    Hypertension    Multiple vessel coronary artery disease     Cath 11/29/17 - 3 V CAD (CTO RCA), 95% m-d Cx (@ OM2 55%)-> PCI Cx-PTCA Om2; 85% p-m LAD => DES PCI    Myocardial infarction of inferolateral wall ([place]) 11/2017   ? prior Infarct; - Cath 11/29/17 - 3 V CAD (CTO RCA), 95% m-d Cx (@ OM2 55%)-> PCI Cx-PTCA Om2; 85% p-m LAD => DES PCI    Narcolepsy    pt may pass out when laughing   Spinal stenosis    TIA (transient ischemic attack)     Past Surgical History:  Procedure Laterality Date   CORONARY BALLOON ANGIOPLASTY N/A 11/29/2017   Procedure: CORONARY BALLOON ANGIOPLASTY;  Surgeon: Leonie Man, MD;  Location: [facility];  Service: Cardiovascular;;  - PTCA of RCA - unable to cross -- LIKELY CTO.   CORONARY STENT INTERVENTION N/A 12/07/2017   Procedure: CORONARY STENT INTERVENTION;  Surgeon: Leonie Man, MD;  Location: [place] [facility];  Service: Cardiovascular; Mid-dist LCx 95% with 55% OM2 => Scoring PTCA of OM2 (20%), LCx PCI - Synergy DES 3 x 16 (3.3 mm); prox LAD 85% PCI- Synergy DES 3 x 16 (3.6 mm).   LEFT HEART CATH N/A 12/07/2017   Procedure: Left Heart Cath;  Surgeon: Leonie Man, MD;  Location: [facility];  Service: Cardiovascular;  Laterality: N/A;   LEFT HEART CATH AND CORONARY  ANGIOGRAPHY N/A 11/29/2017   Procedure: LEFT HEART CATH AND CORONARY ANGIOGRAPHY;  Surgeon: Leonie Man, MD;  Location: [facility];; mRCA CTO, p-m LAD 85%, m-dCx 95% @ OM2 55%. Mod High LVEDP    LIPOMA EXCISION     SPINAL FIXATION SURGERY     TRANSTHORACIC ECHOCARDIOGRAM  11/30/2017   EF 45-50%.  Inferior hypokinesis.  Elevated filling pressures.  Mild to moderate MR.      Medications Prior to Admission: Prior to Admission medications   Medication Sig Start Date End Date Taking? Authorizing Provider  acetaminophen (TYLENOL) 500 MG tablet Take 500 mg by mouth every 6 (six) hours as needed.    [provider]  allopurinol (ZYLOPRIM) 100 MG tablet Take 100 mg by mouth every morning. 10/08/17   [provider]  amoxicillin-clavulanate (AUGMENTIN) 875-125 MG tablet Take 1 tablet by mouth every 12 (twelve) hours. 12/14/20   Hazel Sams, PA-C  apixaban (ELIQUIS) 2.5 MG TABS tablet TAKE 1 TABLET BY MOUTH TWICE A DAY 02/03/22   Almyra Deforest, PA  atorvastatin (LIPITOR) 40 MG tablet TAKE 1 TABLET DAILY AT 6 P.M. 10/25/21   Leonie Man, MD  Cyanocobalamin (VITAMIN B-12) 5000 MCG TBDP Take 1 tablet by mouth daily.    [provider]  fluticasone (  FLONASE) 50 MCG/ACT nasal spray Place into both nostrils daily.    [provider]  furosemide (LASIX) 20 MG tablet Take 1 tablet by mouth daily for the next 5 days, Then take as needed for leg edema. 01/07/22   Almyra Deforest, PA  loratadine (CLARITIN) 10 MG tablet Take 10 mg by mouth daily.    [provider]  Melatonin 10 MG CAPS Take 10 mg by mouth daily.    [provider]  metoprolol tartrate (LOPRESSOR) 25 MG tablet Take 0.5 tablets (12.5 mg total) by mouth 2 (two) times daily. 01/28/22   Almyra Deforest, PA  metoprolol tartrate (LOPRESSOR) 25 MG tablet Take 0.5 tablets (12.5 mg total) by mouth 2 (two) times daily. 01/28/22   Almyra Deforest, PA  Multiple Vitamins-Minerals (CENTRUM SILVER PO) Take 1 tablet by  mouth daily.    [provider]  nitroGLYCERIN (NITROSTAT) 0.4 MG SL tablet DISSOLVE ONE TABLET UNDER THE TONGUE EVERY 5 MINUTES AS NEEDED FOR CHEST PAIN.  DO NOT EXCEED A TOTAL OF 3 DOSES IN 15 MINUTES 08/09/19   Bhagat, Bhavinkumar, PA  Psyllium (METAMUCIL FIBER PO) Take by mouth.    [provider]  terazosin (HYTRIN) 2 MG capsule Take 4 mg by mouth every evening. 10/08/17   [provider]  VITAMIN D, ERGOCALCIFEROL, PO Take 5,000 Units by mouth daily.     [provider]     Allergies:    Allergies  Allergen Reactions   Other Shortness Of Breath    Esters   Contrast Media [Iodinated Contrast Media] Other (See Comments)    Just feel bad all over   Morphine And Related Other (See Comments)    Patient states he does not like to take Morphine as it gives him nightmares and hallucinations.     Social History:   Social History   Socioeconomic History   Marital status: Unknown    Spouse name: Not on file   Number of children: Not on file   Years of education: Not on file   Highest education level: Not on file  Occupational History   Occupation: retired  Tobacco Use   Smoking status: Former   Smokeless tobacco: Never  Scientific laboratory technician Use: Not on file  Substance and Sexual Activity   Alcohol use: Yes    Comment: rare   Drug use: Never   Sexual activity: Not on file  Other Topics Concern   Not on file  Social History Narrative   Not on file   Social Determinants of Health   Financial Resource Strain: Low Risk  (02/04/2018)   Overall Financial Resource Strain (CARDIA)    Difficulty of Paying Living Expenses: Not hard at all  Food Insecurity: No Food Insecurity (02/04/2018)   Hunger Vital Sign    Worried About Running Out of Food in the Last Year: Never true    [place] in the Last Year: Never true  Transportation Needs: No Transportation Needs (02/04/2018)   PRAPARE - Hydrologist (Medical): No    Lack  of Transportation (Non-Medical): No  Physical Activity: Inactive (02/04/2018)   Exercise Vital Sign    Days of Exercise per Week: 0 days    Minutes of Exercise per Session: 0 min  Stress: No Stress Concern Present (02/04/2018)   [place]    Feeling of Stress : Only a little  Social Connections: Not on file  Intimate Partner Violence: Not on file    Family History:  *** The patient's Family history is unknown by patient.    ROS:  Please see the history of present illness.  ***All other ROS reviewed and negative.     Physical Exam/Data:   Vitals:   02/15/22 2103 02/15/22 2200 02/15/22 2257 02/15/22 2308  BP:  134/60    Pulse:  (!) 59    Resp:  17    Temp: 98.5 F (36.9 C) 97.8 F (36.6 C)    TempSrc:  Oral    SpO2:  96%  97%  Weight:   85.9 kg   Height:   '5\' 6"'$  (1.676 m)     Intake/Output Summary (Last 24 hours) at 02/15/2022 2350 Last data filed at 02/15/2022 1847 Gross per 24 hour  Intake --  Output 800 ml  Net -800 ml      02/15/2022   10:57 PM 01/28/2022    9:00 AM 01/07/2022    2:27 PM  Last 3 Weights  Weight (lbs) 189 lb 6 oz 191 lb 193 lb  Weight (kg) 85.9 kg 86.637 kg 87.544 kg     Body mass index is 30.57 kg/m.  General:  Well nourished, well developed, in no acute distress*** HEENT: normal Neck: no*** JVD Vascular: No carotid bruits; Distal pulses 2+ bilaterally   Cardiac:  normal S1, S2; RRR; no murmur *** Lungs:  clear to auscultation bilaterally, no wheezing, rhonchi or rales  Abd: soft, nontender, no hepatomegaly  Ext: no*** edema Musculoskeletal:  No deformities, BUE and BLE strength normal and equal Skin: warm and dry  Neuro:  CNs 2-12 intact, no focal abnormalities noted Psych:  Normal affect    EKG:  The ECG that was done *** was personally reviewed and demonstrates ***  Relevant CV Studies: ***  Laboratory Data:  High Sensitivity Troponin:   Recent Labs  Lab  02/15/22 1406 02/15/22 1635  TROPONINIHS 163* 277*      Chemistry Recent Labs  Lab 02/15/22 1406  NA 141  K 4.5  CL 110  CO2 22  GLUCOSE 160*  BUN 34*  CREATININE 1.68*  CALCIUM 8.5*  GFRNONAA 39*  ANIONGAP 9    Recent Labs  Lab 02/15/22 1406  PROT 6.4*  ALBUMIN 4.0  AST 17  ALT 14  ALKPHOS 92  BILITOT 0.8   Lipids No results for input(s): "CHOL", "TRIG", "HDL", "LABVLDL", "LDLCALC", "CHOLHDL" in the last 168 hours. Hematology Recent Labs  Lab 02/15/22 1406  WBC 8.0  RBC 3.24*  HGB 11.3*  HCT 33.3*  MCV 102.8*  MCH 34.9*  MCHC 33.9  RDW 13.1  PLT 191   Thyroid No results for input(s): "TSH", "FREET4" in the last 168 hours. BNP Recent Labs  Lab 02/15/22 1407  BNP 1,060.9*    DDimer No results for input(s): "DDIMER" in the last 168 hours.   Radiology/Studies:  DG Chest 2 View  Result Date: 02/15/2022 CLINICAL DATA:  Shortness of breath, wheezing EXAM: CHEST - 2 VIEW COMPARISON:  12/03/2017 FINDINGS: The heart size and mediastinal contours are within normal limits. Small, benign calcified left hilar lymph nodes. Mild, diffuse bilateral interstitial opacity and small bilateral pleural effusions. The visualized skeletal structures are unremarkable. IMPRESSION: Mild, diffuse bilateral interstitial opacity and small bilateral pleural effusions, findings consistent with edema or infection. No focal airspace opacity. Electronically Signed   By: Delanna Ahmadi M.D.   On: 02/15/2022 14:05     Assessment and Plan:   ***  Risk Assessment/Risk Scores:  {Complete the following score calculators/questions to meet required metrics.  Press F2:1}  {Is the patient being seen for CHEST PAIN, UNSTABLE ANGINA, NSTEMI or STEMI?     :[id number]} {Does this patient have CHF or CHF symptoms?      :[id number]} {Does this patient have ATRIAL FIBRILLATION?:[phone]}   Severity of Illness: {Observation/Inpatient:21159}   For questions or updates, please contact [place] Please consult [email or web address] for contact info under     Signed, Hershal Coria, MD  02/15/2022 11:50 PM

## 2022-02-16 ENCOUNTER — Inpatient Hospital Stay (HOSPITAL_COMMUNITY): Payer: Medicare Other

## 2022-02-16 DIAGNOSIS — I480 Paroxysmal atrial fibrillation: Secondary | ICD-10-CM | POA: Diagnosis not present

## 2022-02-16 DIAGNOSIS — I214 Non-ST elevation (NSTEMI) myocardial infarction: Principal | ICD-10-CM

## 2022-02-16 DIAGNOSIS — N1832 Chronic kidney disease, stage 3b: Secondary | ICD-10-CM | POA: Diagnosis not present

## 2022-02-16 DIAGNOSIS — I5043 Acute on chronic combined systolic (congestive) and diastolic (congestive) heart failure: Secondary | ICD-10-CM

## 2022-02-16 HISTORY — PX: TRANSTHORACIC ECHOCARDIOGRAM: SHX275

## 2022-02-16 LAB — BASIC METABOLIC PANEL
Anion gap: 9 (ref 5–15)
BUN: 30 mg/dL — ABNORMAL HIGH (ref 8–23)
CO2: 22 mmol/L (ref 22–32)
Calcium: 8.4 mg/dL — ABNORMAL LOW (ref 8.9–10.3)
Chloride: 111 mmol/L (ref 98–111)
Creatinine, Ser: 1.76 mg/dL — ABNORMAL HIGH (ref 0.61–1.24)
GFR, Estimated: 37 mL/min — ABNORMAL LOW (ref >60)
Glucose, Bld: 115 mg/dL — ABNORMAL HIGH (ref 70–99)
Potassium: 3.8 mmol/L (ref 3.5–5.1)
Sodium: 142 mmol/L (ref 135–145)

## 2022-02-16 LAB — PROTIME-INR
INR: 1.2 (ref 0.8–1.2)
Prothrombin Time: 15.3 seconds — ABNORMAL HIGH (ref 11.4–15.2)

## 2022-02-16 LAB — ECHOCARDIOGRAM COMPLETE
AR max vel: 1.5 cm2
AV Area VTI: 1.76 cm2
AV Area mean vel: 1.54 cm2
AV Mean grad: 5 mmHg
AV Peak grad: 10.1 mmHg
Ao pk vel: 1.59 m/s
Area-P 1/2: 3.85 cm2
Calc EF: 41.7 %
Height: 66 in
MV VTI: 1.95 cm2
P 1/2 time: 447 msec
S' Lateral: 3.85 cm
Single Plane A2C EF: 41.8 %
Single Plane A4C EF: 38.2 %
Weight: 3030 oz

## 2022-02-16 LAB — T4, FREE: Free T4: 1.1 ng/dL (ref 0.61–1.12)

## 2022-02-16 LAB — CBC
HCT: 29.7 % — ABNORMAL LOW (ref 39.0–52.0)
Hemoglobin: 10.2 g/dL — ABNORMAL LOW (ref 13.0–17.0)
MCH: 34.8 pg — ABNORMAL HIGH (ref 26.0–34.0)
MCHC: 34.3 g/dL (ref 30.0–36.0)
MCV: 101.4 fL — ABNORMAL HIGH (ref 80.0–100.0)
Platelets: 184 10*3/uL (ref 150–400)
RBC: 2.93 MIL/uL — ABNORMAL LOW (ref 4.22–5.81)
RDW: 13.2 % (ref 11.5–15.5)
WBC: 6.7 10*3/uL (ref 4.0–10.5)
nRBC: 0 % (ref 0.0–0.2)

## 2022-02-16 LAB — LIPID PANEL
Cholesterol: 110 mg/dL (ref 0–200)
HDL: 43 mg/dL (ref >40)
LDL Cholesterol: 55 mg/dL (ref 0–99)
Total CHOL/HDL Ratio: 2.6 RATIO
Triglycerides: 60 mg/dL (ref <150)
VLDL: 12 mg/dL (ref 0–40)

## 2022-02-16 LAB — IRON AND TIBC
Iron: 68 ug/dL (ref 45–182)
Saturation Ratios: 34 % (ref 17.9–39.5)
TIBC: 199 ug/dL — ABNORMAL LOW (ref 250–450)
UIBC: 131 ug/dL

## 2022-02-16 LAB — TYPE AND SCREEN
ABO/RH(D): O POS
Antibody Screen: NEGATIVE

## 2022-02-16 LAB — HEPARIN LEVEL (UNFRACTIONATED): Heparin Unfractionated: >1.1 IU/mL — ABNORMAL HIGH (ref 0.30–0.70)

## 2022-02-16 LAB — HEMOGLOBIN A1C
Hgb A1c MFr Bld: 5.6 % (ref 4.8–5.6)
Mean Plasma Glucose: 114.02 mg/dL

## 2022-02-16 LAB — MRSA NEXT GEN BY PCR, NASAL: MRSA by PCR Next Gen: NOT DETECTED

## 2022-02-16 LAB — APTT
aPTT: 58 seconds — ABNORMAL HIGH (ref 24–36)
aPTT: 69 seconds — ABNORMAL HIGH (ref 24–36)
aPTT: 71 seconds — ABNORMAL HIGH (ref 24–36)

## 2022-02-16 LAB — MAGNESIUM: Magnesium: 2 mg/dL (ref 1.7–2.4)

## 2022-02-16 LAB — FERRITIN: Ferritin: 111 ng/mL (ref 24–336)

## 2022-02-16 LAB — TSH: TSH: 5.579 u[IU]/mL — ABNORMAL HIGH (ref 0.350–4.500)

## 2022-02-16 MED ORDER — ASPIRIN 81 MG PO CHEW
81.0000 mg | CHEWABLE_TABLET | ORAL | Status: AC
  Administered 2022-02-17: 81 mg via ORAL
  Filled 2022-02-16: qty 1

## 2022-02-16 MED ORDER — SODIUM CHLORIDE 0.9% FLUSH
3.0000 mL | Freq: Two times a day (BID) | INTRAVENOUS | Status: DC
  Administered 2022-02-16 – 2022-02-20 (×7): 3 mL via INTRAVENOUS

## 2022-02-16 MED ORDER — POTASSIUM CHLORIDE CRYS ER 20 MEQ PO TBCR
20.0000 meq | EXTENDED_RELEASE_TABLET | Freq: Two times a day (BID) | ORAL | Status: AC
Start: 2022-02-16 — End: 2022-02-16
  Administered 2022-02-16 (×2): 20 meq via ORAL
  Filled 2022-02-16 (×2): qty 1

## 2022-02-16 MED ORDER — ASPIRIN 81 MG PO TBEC
81.0000 mg | DELAYED_RELEASE_TABLET | Freq: Every day | ORAL | Status: DC
  Administered 2022-02-19 – 2022-02-23 (×4): 81 mg via ORAL
  Filled 2022-02-16 (×5): qty 1

## 2022-02-16 MED ORDER — ORAL CARE MOUTH RINSE: 15.0000 mL | OROMUCOSAL | Status: DC | PRN

## 2022-02-16 MED ORDER — FUROSEMIDE 10 MG/ML IJ SOLN
40.0000 mg | Freq: Once | INTRAMUSCULAR | Status: AC
  Administered 2022-02-16: 40 mg via INTRAVENOUS
  Filled 2022-02-16: qty 4

## 2022-02-16 MED ORDER — SODIUM CHLORIDE 0.9 % IV SOLN: 250.0000 mL | INTRAVENOUS | Status: DC | PRN

## 2022-02-16 MED ORDER — ALBUTEROL SULFATE (2.5 MG/3ML) 0.083% IN NEBU
2.5000 mg | INHALATION_SOLUTION | Freq: Four times a day (QID) | RESPIRATORY_TRACT | Status: DC
  Administered 2022-02-16 – 2022-02-17 (×4): 2.5 mg via RESPIRATORY_TRACT
  Filled 2022-02-16 (×4): qty 3

## 2022-02-16 MED ORDER — SODIUM CHLORIDE 0.9% FLUSH: 3.0000 mL | INTRAVENOUS | Status: DC | PRN

## 2022-02-16 MED ORDER — SODIUM CHLORIDE 0.9 % IV SOLN: INTRAVENOUS | Status: DC

## 2022-02-16 NOTE — Plan of Care (Signed)
  Problem: Clinical Measurements: Goal: Respiratory complications will improve Outcome: Progressing Goal: Cardiovascular complication will be avoided Outcome: Progressing   Problem: Activity: Goal: Risk for activity intolerance will decrease Outcome: Progressing   Problem: Coping: Goal: Level of anxiety will decrease Outcome: Progressing   Problem: Elimination: Goal: Will not experience complications related to urinary retention Outcome: Progressing   Problem: Pain Managment: Goal: General experience of comfort will improve Outcome: Progressing   

## 2022-02-16 NOTE — Progress Notes (Signed)
Progress Note  Patient Name: Paul Kramer Date of Encounter: 02/16/2022  [facility] Cardiologist: Glenetta Hew, MD   Subjective   Still dyspneic, although improved from yesterday. Not sure if intake/output are fully recorded, but only -400 cc since yesterday. Wheezing.  However, lying with head of bed at only 20 degrees elevation. Does not have chest discomfort.  Reports that he never had chest discomfort with his initial myocardial infarction either. Creatinine appears to be at baseline.  In 2019 creatinine was 1.74-2.15, this year has been 1.56-1.76.  Inpatient Medications    Scheduled Meds:  allopurinol  100 mg Oral Daily   aspirin EC  81 mg Oral Daily   atorvastatin  40 mg Oral QHS   clopidogrel  75 mg Oral Daily   cyanocobalamin  5,000 mcg Oral Daily   fluticasone  1 spray Each Nare Daily   melatonin  10 mg Oral Daily   metoprolol tartrate  12.5 mg Oral BID   multivitamin with minerals  1 tablet Oral Daily   terazosin  4 mg Oral QPM   Continuous Infusions:  heparin 1,150 Units/hr (02/16/22 0434)   PRN Meds: acetaminophen, nitroGLYCERIN, mouth rinse   Vital Signs    Vitals:   02/15/22 2308 02/16/22 0441 02/16/22 0500 02/16/22 0800  BP:  134/66  (!) 145/75  Pulse:    (!) 57  Resp:    18  Temp:  97.8 F (36.6 C)    TempSrc:  Oral    SpO2: 97%   97%  Weight:   85.9 kg   Height:        Intake/Output Summary (Last 24 hours) at 02/16/2022 1108 Last data filed at 02/16/2022 0434 Gross per 24 hour  Intake 607.65 ml  Output 1000 ml  Net -392.35 ml      02/16/2022    5:00 AM 02/15/2022   10:57 PM 01/28/2022    9:00 AM  Last 3 Weights  Weight (lbs) 189 lb 6 oz 189 lb 6 oz 191 lb  Weight (kg) 85.9 kg 85.9 kg 86.637 kg      Telemetry    Normal sinus rhythm/sinus bradycardia- Personally Reviewed  ECG    Sinus bradycardia with first-degree AV block and a single PAC, diminutive R waves in leads V1-V3 , ST segment depression with T wave inversion in  V4-V6, QTc 488 ms personally Reviewed.  Previous ECG from 01/28/2022 was in atrial fibrillation with rapid ventricular response but was otherwise a similar tracing.  However, ECG from 01/07/2022 did not show ST-T wave changes  Physical Exam  Appears comfortable lying fairly flat in bed, but does have active wheezing. GEN: No acute distress.   Neck: No JVD Cardiac: RRR, no murmurs, rubs, or gallops.  Respiratory: No wheezing, very few scattered moist rales in the bases GI: Soft, nontender, non-distended  MS: No edema; No deformity. Neuro:  Nonfocal  Psych: Normal affect   Labs    High Sensitivity Troponin:   Recent Labs  Lab 02/15/22 1406 02/15/22 1635  TROPONINIHS 163* 277*     Chemistry Recent Labs  Lab 02/15/22 1406 02/16/22 0109  NA 141 142  K 4.5 3.8  CL 110 111  CO2 22 22  GLUCOSE 160* 115*  BUN 34* 30*  CREATININE 1.68* 1.76*  CALCIUM 8.5* 8.4*  MG  --  2.0  PROT 6.4*  --   ALBUMIN 4.0  --   AST 17  --   ALT 14  --   ALKPHOS 92  --  BILITOT 0.8  --   GFRNONAA 39* 37*  ANIONGAP 9 9    Lipids  Recent Labs  Lab 02/16/22 0109  CHOL 110  TRIG 60  HDL 43  LDLCALC 55  CHOLHDL 2.6    Hematology Recent Labs  Lab 02/15/22 1406 02/16/22 0109  WBC 8.0 6.7  RBC 3.24* 2.93*  HGB 11.3* 10.2*  HCT 33.3* 29.7*  MCV 102.8* 101.4*  MCH 34.9* 34.8*  MCHC 33.9 34.3  RDW 13.1 13.2  PLT 191 184   Thyroid  Recent Labs  Lab 02/16/22 0109  TSH 5.579*  FREET4 1.10    BNP Recent Labs  Lab 02/15/22 1407  BNP 1,060.9*    DDimer No results for input(s): "DDIMER" in the last 168 hours.   Radiology    DG Chest 2 View  Result Date: 02/15/2022 CLINICAL DATA:  Shortness of breath, wheezing EXAM: CHEST - 2 VIEW COMPARISON:  12/03/2017 FINDINGS: The heart size and mediastinal contours are within normal limits. Small, benign calcified left hilar lymph nodes. Mild, diffuse bilateral interstitial opacity and small bilateral pleural effusions. The visualized  skeletal structures are unremarkable. IMPRESSION: Mild, diffuse bilateral interstitial opacity and small bilateral pleural effusions, findings consistent with edema or infection. No focal airspace opacity. Electronically Signed   By: Delanna Ahmadi M.D.   On: 02/15/2022 14:05    Cardiac Studies   Echocardiogram 01/20/2022     1. Left ventricular ejection fraction by 3D volume is 51 %. The left  ventricle has low normal function. The left ventricle has no regional wall  motion abnormalities. Left ventricular diastolic parameters are consistent  with Grade II diastolic  dysfunction (pseudonormalization).   2. Right ventricular systolic function is normal. The right ventricular  size is normal.   3. Left atrial size was moderately dilated.   4. The mitral valve is normal in structure. Mild to moderate mitral valve  regurgitation. No evidence of mitral stenosis.   5. The aortic valve is tricuspid. The right cusp appears to be  restrictive in motion. Aortic valve regurgitation is moderate. Mild aortic  valve stenosis. Aortic regurgitation PHT measures 547 msec.   6. There is mild dilatation of the aortic root, measuring 37 mm. There is  moderate dilatation of the ascending aorta, measuring 43 mm.   7. The inferior vena cava is normal in size with greater than 50%  respiratory variability, suggesting right atrial pressure of 3 mmHg.    Cardiac catheterization 12/07/2017  LESION #1 & 1.5: Mid Cx to Dist Cx lesion is 95% stenosed with 55% stenosed side branch in Ost 2nd Mrg. Scoring balloon angioplasty was performed on the side branch using a BALLOON WOLVERINE 2.50X10. Post intervention, the side branch was reduced to 20% residual stenosis. A drug-eluting stent was successfully placed in the main Circumflex using a STENT SYNERGY DES 3X16. -Postdilated to 3.3 mm Post intervention, there is a 0% residual stenosis. LESION #2: Prox LAD lesion is 85% stenosed. A drug-eluting stent was  successfully placed using a STENT SYNERGY DES 3X16. - Post-dilated to 3.6 mm Post intervention, there is a 0% residual stenosis. _______________________________________________ LV end diastolic pressure is moderately elevated.   Successful DES PCI of mid-dist Cx across 2nd Mrg & AVG Cx -Synergy DES 3.0 mm x 16 mm (3.3 mm) with Cutting Balloon PTCA of ostial 2nd Mrg using a 2.5 mm Wolverine balloon. Successful PCI of Ost LAD to Prox LAD lesion is 85% stenosed.-With a Synergy DES 3.0 mm x 16 mm postdilated to  3.6 mm  Moderately Elevated LVEDP     Transfer to 6 Central post procedure unit for sheath removal with manual pressure. Monitor renal function and hemoglobin/hematocrit Dual antiplatelet therapy for minimum 1 year     Anticipate discharge in about 2 days if renal function stable.  Diagnostic Dominance: Right  Intervention     Patient Profile     88 y.o. male with extensive history of CAD (previous STEMI, stents to proximal LAD and mid LCx 2019, chronic total occlusion of RCA), presents with acute on chronic exacerbation of heart failure with mildly reduced left ventricular systolic function (EF 29% by echo July 2023), on background of hypertension, hypercholesterolemia, paroxysmal atrial fibrillation on chronic anticoagulation, CKD stage IIIb, BPH  Assessment & Plan    CHF: Has clinical evidence of heart failure exacerbation/hypervolemia.  We will give another dose of diuretics, but have to be careful to avoid excessive diuresis before cardiac catheterization.  Symptom onset was relatively gradual and could be due to reduced compliance with dietary sodium restriction while on vacation at the beach.  He never had angina with his previous infarction either, presenting with shortness of breath. CAD: There are ECG changes, but by biomarkers there is only a tiny amount of injury (peak troponin around 250).  Could represent demand infarction due to heart failure, but it is also possible  that there is an extensive area of ischemic myocardium in jeopardy that caused heart failure decompensation.  We will plan for coronary angiography in the morning.  This will allow time for oral anticoagulant effect to diminish and for [place] to optimize his volume status.  We reviewed all of the risks and possible complications of cardiac catheterization and revascularization in detail.  Specifically spent time discussing the risk of contrast nephrotoxicity and progression of his renal dysfunction.  If he does have a new coronary abnormality that requires revascularization, it is possible that this will be performed as a staged procedure to minimize the risk for kidney dysfunction.  On aspirin and high-dose statin and beta-blocker.  He is not a surgical candidate.  He has been preloaded with clopidogrel 300 mg. Parox AFib: Only in sinus rhythm.  Eliquis on hold and replaced with intravenous heparin in anticipation of cardiac catheterization. CKD3B: Creatinine has actually been better in the last 12 months compared to 2019.  Appears to be at baseline renal function at this time.  Avoid excessive diuresis.  Avoid all other nephrotoxic agents.  Minimize contrast use during angiography.    Shared Decision Making/Informed Consent The risks [stroke (1 in 1000), death (1 in 1000), kidney failure [usually temporary] (1 in 500), bleeding (1 in 200), allergic reaction [possibly serious] (1 in 200)], benefits (diagnostic support and management of coronary artery disease) and alternatives of a cardiac catheterization were discussed in detail with Mr. Veronica and he is willing to proceed.   His 2 adult children were in the room at the time of the discussion regarding cardiac catheterization.  His son Annie Main is a Conservation officer, historic buildings at [employer].  For questions or updates, please contact [place] Please consult [email or web address] for contact info under        Signed, Sanda Klein, MD  02/16/2022, 11:08  AM

## 2022-02-16 NOTE — Progress Notes (Addendum)
[place] for heparin Indication: chest pain/ACS  Allergies  Allergen Reactions   Other Shortness Of Breath    Esters   Robitussin Dm Max Day-Night Anaphylaxis and Swelling    Throat swelling   Banana Other (See Comments)    Severe indigestion   Contrast Media [Iodinated Contrast Media] Other (See Comments)    Just feel bad all over   Morphine And Related Other (See Comments)    Patient states he does not like to take Morphine as it gives him nightmares and hallucinations.     Patient Measurements: Height: '5\' 6"'$  (167.6 cm) Weight: 85.9 kg (189 lb 6 oz) IBW/kg (Calculated) : 63.8 Heparin Dosing Weight: 80kg  Vital Signs: Temp: 97.9 F (36.6 C) (08/20 1218) Temp Source: Oral (08/20 1218) BP: 143/69 (08/20 1739) Pulse Rate: 66 (08/20 1218)  Labs: Recent Labs    02/15/22 1406 02/15/22 1635 02/16/22 0109 02/16/22 1156 02/16/22 1919  HGB 11.3*  --  10.2*  --   --   HCT 33.3*  --  29.7*  --   --   PLT 191  --  184  --   --   APTT  --   --  58* 69* 71*  LABPROT  --   --  15.3*  --   --   INR  --   --  1.2  --   --   HEPARINUNFRC  --   --  >1.10*  --   --   CREATININE 1.68*  --  1.76*  --   --   TROPONINIHS 163* 277*  --   --   --      Estimated Creatinine Clearance: 29.2 mL/min (A) (by C-G formula based on SCr of 1.76 mg/dL (H)).   Assessment: 83 YOM presenting with SOB, hx CAD and elevated troponin, he is on Eliquis PTA for afib, chronic anemia stable, plts 184. Eliquis affecting heparin level so will utilize aPTT for monitoring until levels correlate. Last dose of apixaban 8/19 AM per med rec.   Heparin level >1.1 8/20 AM (affected by Eliquis), aPTT 71secs (therapeutic) on infusion at 1150 units/hr. No issues with line or bleeding documented. Likely cath on Monday.  Goal of Therapy:  Heparin level 0.3-0.7 units/ml aPTT 66-102 seconds Monitor platelets by anticoagulation protocol: Yes   Plan:  Continue heparin gtt at  1150 units/hr Monitor CBC, heparin level, aPTT, signs/symptoms of bleeding daily  Discontinue aPTT lab once it correlates with heparin level    Billey Gosling, PharmD PGY1 Pharmacy Resident 8/20/20238:53 PM

## 2022-02-16 NOTE — Progress Notes (Signed)
[place] for heparin Indication: chest pain/ACS  Allergies  Allergen Reactions   Other Shortness Of Breath    Esters   Robitussin Dm Max Day-Night Anaphylaxis and Swelling    Throat swelling   Banana Other (See Comments)    Severe indigestion   Contrast Media [Iodinated Contrast Media] Other (See Comments)    Just feel bad all over   Morphine And Related Other (See Comments)    Patient states he does not like to take Morphine as it gives him nightmares and hallucinations.     Patient Measurements: Height: '5\' 6"'$  (167.6 cm) Weight: 85.9 kg (189 lb 6 oz) IBW/kg (Calculated) : 63.8 Heparin Dosing Weight: 80kg  Vital Signs: Temp: 97.9 F (36.6 C) (08/20 1218) Temp Source: Oral (08/20 1218) BP: 152/76 (08/20 1218) Pulse Rate: 66 (08/20 1218)  Labs: Recent Labs    02/15/22 1406 02/15/22 1635 02/16/22 0109 02/16/22 1156  HGB 11.3*  --  10.2*  --   HCT 33.3*  --  29.7*  --   PLT 191  --  184  --   APTT  --   --  58* 69*  LABPROT  --   --  15.3*  --   INR  --   --  1.2  --   HEPARINUNFRC  --   --  >1.10*  --   CREATININE 1.68*  --  1.76*  --   TROPONINIHS 163* 277*  --   --      Estimated Creatinine Clearance: 29.2 mL/min (A) (by C-G formula based on SCr of 1.76 mg/dL (H)).   Assessment: 69 YOM presenting with SOB, hx CAD and elevated troponin, he is on Eliquis PTA for afib, chronic anemia stable, plts 191. Eliquis affecting heparin level so will utilize aPTT for monitoring until levels correlate.  Heparin level >1.1 (affected by Eliquis), aPTT 69 sec (therapeutic) on infusion at 1150 units/hr. No issues with line or bleeding reported per RN. Likely cath on Monday.  Goal of Therapy:  Heparin level 0.3-0.7 units/ml aPTT 66-102 seconds Monitor platelets by anticoagulation protocol: Yes   Plan:  Continue heparin gtt at 1150 units/hr F/u 8 hr confirmatory aPTT  Michalina Calbert A. Levada Dy, PharmD, BCPS, FNKF Clinical Pharmacist [facility] Please utilize Amion for appropriate phone number to reach the unit pharmacist ([place])

## 2022-02-16 NOTE — Progress Notes (Signed)
*  PRELIMINARY RESULTS* Echocardiogram 2D Echocardiogram has been performed.  Paul Kramer 02/16/2022, 3:35 PM

## 2022-02-16 NOTE — Progress Notes (Signed)
[place] for heparin Indication: chest pain/ACS  Allergies  Allergen Reactions   Other Shortness Of Breath    Esters   Contrast Media [Iodinated Contrast Media] Other (See Comments)    Just feel bad all over   Morphine And Related Other (See Comments)    Patient states he does not like to take Morphine as it gives him nightmares and hallucinations.     Patient Measurements: Height: '5\' 6"'$  (167.6 cm) Weight: 85.9 kg (189 lb 6 oz) IBW/kg (Calculated) : 63.8 Heparin Dosing Weight: 80kg  Vital Signs: Temp: 97.8 F (36.6 C) (08/19 2200) Temp Source: Oral (08/19 2200) BP: 134/60 (08/19 2200) Pulse Rate: 59 (08/19 2200)  Labs: Recent Labs    02/15/22 1406 02/15/22 1635 02/16/22 0109  HGB 11.3*  --  10.2*  HCT 33.3*  --  29.7*  PLT 191  --  184  APTT  --   --  58*  LABPROT  --   --  15.3*  INR  --   --  1.2  HEPARINUNFRC  --   --  >1.10*  CREATININE 1.68*  --   --   TROPONINIHS 163* 277*  --      Estimated Creatinine Clearance: 30.6 mL/min (A) (by C-G formula based on SCr of 1.68 mg/dL (H)).   Assessment: 19 YOM presenting with SOB, hx CAD and elevated troponin, he is on Eliquis PTA for afib, chronic anemia stable, plts 191. Eliquis affecting heparin level so will utilize aPTT for monitoring until levels correlate.  Heparin level >1.1 (affected by Eliquis), aPTT 58 sec (subtherapeutic) on infusion at 1000 units/hr. No issues with line or bleeding reported per RN. Likely cath on Monday.  Goal of Therapy:  Heparin level 0.3-0.7 units/ml aPTT 66-102 seconds Monitor platelets by anticoagulation protocol: Yes   Plan:  Increase heparin gtt to 1150 units/hr F/u 8 hr aPTT  Sherlon Handing, PharmD, BCPS Please see amion for complete clinical pharmacist phone list 02/16/2022 2:52 AM

## 2022-02-17 DIAGNOSIS — I249 Acute ischemic heart disease, unspecified: Secondary | ICD-10-CM

## 2022-02-17 LAB — BASIC METABOLIC PANEL
Anion gap: 9 (ref 5–15)
BUN: 31 mg/dL — ABNORMAL HIGH (ref 8–23)
CO2: 23 mmol/L (ref 22–32)
Calcium: 8.3 mg/dL — ABNORMAL LOW (ref 8.9–10.3)
Chloride: 106 mmol/L (ref 98–111)
Creatinine, Ser: 2.05 mg/dL — ABNORMAL HIGH (ref 0.61–1.24)
GFR, Estimated: 30 mL/min — ABNORMAL LOW (ref >60)
Glucose, Bld: 120 mg/dL — ABNORMAL HIGH (ref 70–99)
Potassium: 4 mmol/L (ref 3.5–5.1)
Sodium: 138 mmol/L (ref 135–145)

## 2022-02-17 LAB — CBC
HCT: 31.3 % — ABNORMAL LOW (ref 39.0–52.0)
Hemoglobin: 10.7 g/dL — ABNORMAL LOW (ref 13.0–17.0)
MCH: 35.1 pg — ABNORMAL HIGH (ref 26.0–34.0)
MCHC: 34.2 g/dL (ref 30.0–36.0)
MCV: 102.6 fL — ABNORMAL HIGH (ref 80.0–100.0)
Platelets: 173 10*3/uL (ref 150–400)
RBC: 3.05 MIL/uL — ABNORMAL LOW (ref 4.22–5.81)
RDW: 13.2 % (ref 11.5–15.5)
WBC: 6.8 10*3/uL (ref 4.0–10.5)
nRBC: 0 % (ref 0.0–0.2)

## 2022-02-17 LAB — HEPARIN LEVEL (UNFRACTIONATED): Heparin Unfractionated: 0.77 IU/mL — ABNORMAL HIGH (ref 0.30–0.70)

## 2022-02-17 LAB — LIPOPROTEIN A (LPA): Lipoprotein (a): 10 nmol/L (ref <75.0)

## 2022-02-17 LAB — APTT
aPTT: 55 seconds — ABNORMAL HIGH (ref 24–36)
aPTT: 70 seconds — ABNORMAL HIGH (ref 24–36)

## 2022-02-17 MED ORDER — ALBUTEROL SULFATE (2.5 MG/3ML) 0.083% IN NEBU
2.5000 mg | INHALATION_SOLUTION | Freq: Two times a day (BID) | RESPIRATORY_TRACT | Status: DC
  Administered 2022-02-17 – 2022-02-19 (×5): 2.5 mg via RESPIRATORY_TRACT
  Filled 2022-02-17 (×5): qty 3

## 2022-02-17 MED ORDER — SODIUM CHLORIDE 0.9 % IV SOLN
INTRAVENOUS | Status: DC
Start: 2022-02-18 — End: 2022-02-21

## 2022-02-17 NOTE — Progress Notes (Addendum)
Progress Note  Patient Name: Paul Kramer Date of Encounter: 02/17/2022  [facility] Cardiologist: Glenetta Hew, MD   Subjective   Patient doing well this AM. Denies chest pain, palpitations, dizziness/lightheadedness, sob, orthopnea. Abdominal distention and ankle edema significantly improved  Inpatient Medications    Scheduled Meds:  albuterol  2.5 mg Nebulization Q6H   allopurinol  100 mg Oral Daily   [START ON 02/18/2022] aspirin EC  81 mg Oral Daily   atorvastatin  40 mg Oral QHS   clopidogrel  75 mg Oral Daily   cyanocobalamin  5,000 mcg Oral Daily   fluticasone  1 spray Each Nare Daily   melatonin  10 mg Oral Daily   metoprolol tartrate  12.5 mg Oral BID   multivitamin with minerals  1 tablet Oral Daily   sodium chloride flush  3 mL Intravenous Q12H   terazosin  4 mg Oral QPM   Continuous Infusions:  sodium chloride     sodium chloride 10 mL/hr at 02/17/22 0558   heparin 1,150 Units/hr (02/16/22 1309)   PRN Meds: sodium chloride, acetaminophen, nitroGLYCERIN, mouth rinse, sodium chloride flush   Vital Signs    Vitals:   02/16/22 2132 02/16/22 2300 02/17/22 0311 02/17/22 0400  BP:    136/67  Pulse:    (!) 52  Resp:    20  Temp:  97.8 F (36.6 C)  97.9 F (36.6 C)  TempSrc:  Oral  Oral  SpO2: 97%  97% 93%  Weight:      Height:        Intake/Output Summary (Last 24 hours) at 02/17/2022 0805 Last data filed at 02/17/2022 0353 Gross per 24 hour  Intake 243 ml  Output 1755 ml  Net -1512 ml      02/16/2022    5:00 AM 02/15/2022   10:57 PM 01/28/2022    9:00 AM  Last 3 Weights  Weight (lbs) 189 lb 6 oz 189 lb 6 oz 191 lb  Weight (kg) 85.9 kg 85.9 kg 86.637 kg      Telemetry    Sinus bradycardia, HR in the 50s  - Personally Reviewed  ECG     Sinus bradycardia, HR 56 BPM, 1st degree AV block - Personally Reviewed  Physical Exam   GEN: No acute distress. Laying comfortably in the bed wearing [place]  Neck: No JVD Cardiac: RRR, no murmurs, rubs,  or gallops.  Respiratory: Crackled in bilateral lung bases  GI: Soft, nontender, non-distended  MS: No edema; No deformity. Neuro:  Nonfocal  Psych: Normal affect   Labs    High Sensitivity Troponin:   Recent Labs  Lab 02/15/22 1406 02/15/22 1635  TROPONINIHS 163* 277*     Chemistry Recent Labs  Lab 02/15/22 1406 02/16/22 0109 02/17/22 0119  NA 141 142 138  K 4.5 3.8 4.0  CL 110 111 106  CO2 '22 22 23  '$ GLUCOSE 160* 115* 120*  BUN 34* 30* 31*  CREATININE 1.68* 1.76* 2.05*  CALCIUM 8.5* 8.4* 8.3*  MG  --  2.0  --   PROT 6.4*  --   --   ALBUMIN 4.0  --   --   AST 17  --   --   ALT 14  --   --   ALKPHOS 92  --   --   BILITOT 0.8  --   --   GFRNONAA 39* 37* 30*  ANIONGAP '9 9 9    '$ Lipids  Recent Labs  Lab 02/16/22 0109  CHOL 110  TRIG 60  HDL 43  LDLCALC 55  CHOLHDL 2.6    Hematology Recent Labs  Lab 02/15/22 1406 02/16/22 0109 02/17/22 0119  WBC 8.0 6.7 6.8  RBC 3.24* 2.93* 3.05*  HGB 11.3* 10.2* 10.7*  HCT 33.3* 29.7* 31.3*  MCV 102.8* 101.4* 102.6*  MCH 34.9* 34.8* 35.1*  MCHC 33.9 34.3 34.2  RDW 13.1 13.2 13.2  PLT 191 184 173   Thyroid  Recent Labs  Lab 02/16/22 0109  TSH 5.579*  FREET4 1.10    BNP Recent Labs  Lab 02/15/22 1407  BNP 1,060.9*    DDimer No results for input(s): "DDIMER" in the last 168 hours.   Radiology    ECHOCARDIOGRAM COMPLETE  Result Date: 02/16/2022    ECHOCARDIOGRAM REPORT   Patient Name:   Paul Kramer Date of Exam: 02/16/2022 Medical Rec #:  [id number]     Height:       66.0 in Accession #:    [id number]    Weight:       189.4 lb Date of Birth:  07/24/1929    BSA:          1.954 m Patient Age:    89 years      BP:           152/76 mmHg Patient Gender: M             HR:           76 bpm. Exam Location:  Inpatient Procedure: 2D Echo, Cardiac Doppler and Color Doppler Indications:    NSTEMI  History:        Patient has prior history of Echocardiogram examinations, most                 recent 01/20/2022.  Cardiomyopathy, Acute MI, Previous Myocardial                 Infarction and CAD, Stroke, Arrythmias:Atrial Fibrillation; Risk                 Factors:Hypertension and Dyslipidemia.  Sonographer:    Wenda Low Referring Phys: [id number] [place]  1. Left ventricular ejection fraction, by estimation, is 45 to 50%. The left ventricle has low normal function. The left ventricle has no regional wall motion abnormalities. There is mild left ventricular hypertrophy. Left ventricular diastolic parameters are consistent with Grade II diastolic dysfunction (pseudonormalization). Elevated left atrial pressure.  2. Right ventricular systolic function is normal. The right ventricular size is normal.  3. Left atrial size was moderately dilated.  4. The mitral valve is abnormal. Mild mitral valve regurgitation. No evidence of mitral stenosis.  5. The aortic valve is tricuspid. There is moderate calcification of the aortic valve. There is moderate thickening of the aortic valve. Aortic valve regurgitation is mild to moderate.  6. Aortic dilatation noted. There is mild dilatation of the ascending aorta, measuring 40 mm.  7. The inferior vena cava is normal in size with greater than 50% respiratory variability, suggesting right atrial pressure of 3 mmHg. FINDINGS  Left Ventricle: Left ventricular ejection fraction, by estimation, is 45 to 50%. The left ventricle has low normal function. The left ventricle has no regional wall motion abnormalities. The left ventricular internal cavity size was normal in size. There is mild left ventricular hypertrophy. Left ventricular diastolic parameters are consistent with Grade II diastolic dysfunction (pseudonormalization). Elevated left atrial pressure. Right Ventricle: The right ventricular size is normal. Right vetricular wall thickness was not  well visualized. Right ventricular systolic function is normal. Left Atrium: Left atrial size was moderately dilated. Right  Atrium: Right atrial size was normal in size. Pericardium: There is no evidence of pericardial effusion. Mitral Valve: The mitral valve is abnormal. There is mild thickening of the mitral valve leaflet(s). There is mild calcification of the mitral valve leaflet(s). Mild mitral annular calcification. Mild mitral valve regurgitation. No evidence of mitral valve stenosis. MV peak gradient, 7.4 mmHg. The mean mitral valve gradient is 2.0 mmHg. Tricuspid Valve: The tricuspid valve is normal in structure. Tricuspid valve regurgitation is not demonstrated. No evidence of tricuspid stenosis. Aortic Valve: The aortic valve is tricuspid. There is moderate calcification of the aortic valve. There is moderate thickening of the aortic valve. There is moderate aortic valve annular calcification. Aortic valve regurgitation is mild to moderate. Aortic regurgitation PHT measures 447 msec. Aortic valve mean gradient measures 5.0 mmHg. Aortic valve peak gradient measures 10.1 mmHg. Aortic valve area, by VTI measures 1.76 cm. Pulmonic Valve: The pulmonic valve was not well visualized. Pulmonic valve regurgitation is not visualized. No evidence of pulmonic stenosis. Aorta: The aortic root is normal in size and structure and aortic dilatation noted. There is mild dilatation of the ascending aorta, measuring 40 mm. Venous: The inferior vena cava is normal in size with greater than 50% respiratory variability, suggesting right atrial pressure of 3 mmHg. IAS/Shunts: No atrial level shunt detected by color flow Doppler.  LEFT VENTRICLE PLAX 2D LVIDd:         4.90 cm      Diastology LVIDs:         3.85 cm      LV e' medial:    5.87 cm/s LV PW:         1.20 cm      LV E/e' medial:  14.6 LV IVS:        1.20 cm      LV e' lateral:   6.96 cm/s LVOT diam:     2.00 cm      LV E/e' lateral: 12.3 LV SV:         74 LV SV Index:   38 LVOT Area:     3.14 cm  LV Volumes (MOD) LV vol d, MOD A2C: 114.0 ml LV vol d, MOD A4C: 107.0 ml LV vol s, MOD A2C:  66.3 ml LV vol s, MOD A4C: 66.1 ml LV SV MOD A2C:     47.7 ml LV SV MOD A4C:     107.0 ml LV SV MOD BP:      47.6 ml RIGHT VENTRICLE RV Basal diam:  3.85 cm RV Mid diam:    4.00 cm RV S prime:     16.20 cm/s TAPSE (M-mode): 2.7 cm LEFT ATRIUM             Index        RIGHT ATRIUM           Index LA diam:        4.40 cm 2.25 cm/m   RA Area:     16.50 cm LA Vol (A2C):   76.3 ml 39.05 ml/m  RA Volume:   49.70 ml  25.44 ml/m LA Vol (A4C):   98.6 ml 50.46 ml/m LA Biplane Vol: 87.4 ml 44.73 ml/m  AORTIC VALVE                     PULMONIC VALVE AV Area (Vmax):    1.50 cm  PV Vmax:       0.60 m/s AV Area (Vmean):   1.54 cm      PV Peak grad:  1.4 mmHg AV Area (VTI):     1.76 cm AV Vmax:           159.00 cm/s AV Vmean:          104.500 cm/s AV VTI:            0.424 m AV Peak Grad:      10.1 mmHg AV Mean Grad:      5.0 mmHg LVOT Vmax:         75.70 cm/s LVOT Vmean:        51.300 cm/s LVOT VTI:          0.237 m LVOT/AV VTI ratio: 0.56 AI PHT:            447 msec  AORTA Ao Root diam: 3.60 cm Ao Asc diam:  4.00 cm MITRAL VALVE MV Area (PHT): 3.85 cm    SHUNTS MV Area VTI:   1.95 cm    Systemic VTI:  0.24 m MV Peak grad:  7.4 mmHg    Systemic Diam: 2.00 cm MV Mean grad:  2.0 mmHg MV Vmax:       1.36 m/s MV Vmean:      58.3 cm/s MV Decel Time: 197 msec MV E velocity: 85.90 cm/s MV A velocity: 66.80 cm/s MV E/A ratio:  1.29 Paul Dolly MD Electronically signed by Paul Dolly MD Signature Date/Time: 02/16/2022/5:19:05 PM    Final    DG Chest 2 View  Result Date: 02/15/2022 CLINICAL DATA:  Shortness of breath, wheezing EXAM: CHEST - 2 VIEW COMPARISON:  12/03/2017 FINDINGS: The heart size and mediastinal contours are within normal limits. Small, benign calcified left hilar lymph nodes. Mild, diffuse bilateral interstitial opacity and small bilateral pleural effusions. The visualized skeletal structures are unremarkable. IMPRESSION: Mild, diffuse bilateral interstitial opacity and small bilateral pleural  effusions, findings consistent with edema or infection. No focal airspace opacity. Electronically Signed   By: Delanna Ahmadi M.D.   On: 02/15/2022 14:05    Cardiac Studies   Echocardiogram 01/20/2022      1. Left ventricular ejection fraction by 3D volume is 51 %. The left  ventricle has low normal function. The left ventricle has no regional wall  motion abnormalities. Left ventricular diastolic parameters are consistent  with Grade II diastolic  dysfunction (pseudonormalization).   2. Right ventricular systolic function is normal. The right ventricular  size is normal.   3. Left atrial size was moderately dilated.   4. The mitral valve is normal in structure. Mild to moderate mitral valve  regurgitation. No evidence of mitral stenosis.   5. The aortic valve is tricuspid. The right cusp appears to be  restrictive in motion. Aortic valve regurgitation is moderate. Mild aortic  valve stenosis. Aortic regurgitation PHT measures 547 msec.   6. There is mild dilatation of the aortic root, measuring 37 mm. There is  moderate dilatation of the ascending aorta, measuring 43 mm.   7. The inferior vena cava is normal in size with greater than 50%  respiratory variability, suggesting right atrial pressure of 3 mmHg.      Cardiac catheterization 12/07/2017   LESION #1 & 1.5: Mid Cx to Dist Cx lesion is 95% stenosed with 55% stenosed side branch in Ost 2nd Mrg. Scoring balloon angioplasty was performed on the side branch using a BALLOON WOLVERINE 2.50X10. Post  intervention, the side branch was reduced to 20% residual stenosis. A drug-eluting stent was successfully placed in the main Circumflex using a STENT SYNERGY DES 3X16. -Postdilated to 3.3 mm Post intervention, there is a 0% residual stenosis. LESION #2: Prox LAD lesion is 85% stenosed. A drug-eluting stent was successfully placed using a STENT SYNERGY DES 3X16. - Post-dilated to 3.6 mm Post intervention, there is a 0% residual  stenosis. _______________________________________________ LV end diastolic pressure is moderately elevated.   Successful DES PCI of mid-dist Cx across 2nd Mrg & AVG Cx -Synergy DES 3.0 mm x 16 mm (3.3 mm) with Cutting Balloon PTCA of ostial 2nd Mrg using a 2.5 mm Wolverine balloon. Successful PCI of Ost LAD to Prox LAD lesion is 85% stenosed.-With a Synergy DES 3.0 mm x 16 mm postdilated to 3.6 mm  Moderately Elevated LVEDP     Transfer to 6 Central post procedure unit for sheath removal with manual pressure. Monitor renal function and hemoglobin/hematocrit Dual antiplatelet therapy for minimum 1 year     Anticipate discharge in about 2 days if renal function stable.   Diagnostic Dominance: Right  Intervention     Patient Profile     88 y.o. male with extensive history of CAD (previous STEMI, stents to proximal LAD and mid LCx 2019, chronic total occlusion of RCA), presents with acute on chronic exacerbation of heart failure with mildly reduced left ventricular systolic function (EF 10% by echo July 2023), on background of hypertension, hypercholesterolemia, paroxysmal atrial fibrillation on chronic anticoagulation, CKD stage IIIb, BPH  Assessment & Plan    Acute on Chronic combined Systolic and Diastolic CHF  Ischemic Cardiomyopathy  - Echo this admission showed EV 45-50%, no regional wall motion abnormalities, grade II diastolic dysfunction, normal RV systolic function  - Patient presented with evidence of hypervolemia-- BNP elevated to 1060, CXR with mild, diffuse bilateral Insterstitial opacity and small bilateral pleural effusions - So far, patient has received 2 doses of IV lasix 40 mg. Output 1.8 L urine yesterday. Continues to have crackles in lung bases, but creatinine did increase up to 2.05 today (up from 1.76 yesterday) - Hold lasix today to protect renal function prior to cath  - Patient was planned to have LHC today, will confirm with MD but I suspect we will postpone  until tomorrow given renal function. Could benefit from a R heart catheterization as well  - Continue metoprolol 12.5 mg BID  CAD  Elevated Trop  - Note that patient has had EKG changes, trop peaked around 250  - Possible that trop elevation and EKG changes are due to demand infarction in the setting of heart failure. However, also possible that there is a larger area of ischemic myocardium that is causing patient's decompensation - Patient agreeable to [place]-- creatinine up to 2.05 today, may need to postpone until tomorrow. IF patient does need revascularization, it will likely need to be done as a staged procedure  - See progress note from 8/20 for documented consent for catheterization  - Continue ASA, plavix, metoprolol 12.5 mg BID, lipitor 40 mg daily  Paroxysmal Atrial Fibrillation  - Patient is maintaining rhythm per telemetry with HR in the 50s  - Eliquis held and patient is on IV heparin for cath  - Continue metoprolol as above   AKI on CKD stage IIIb - Creatinine ranged from 1.56- 1.76 for the past month  - Creatinine increased from 1.76 yesterday to 2.05 today after receiving IV lasix  - Avoid nephrotoxic medications,  holding lasix for today       For questions or updates, please contact [facility] Please consult [email or web address] for contact info under        Signed, Margie Billet, PA-C  02/17/2022, 8:05 AM    Patient seen and examined with Vikki Ports PA-C.  Agree as above, with the following exceptions and changes as noted below. Feeling well today, improved breathing and LE edema. Gen: NAD, CV: RRR, no murmurs, Lungs: clear, Abd: soft, Extrem: Warm, well perfused, no edema, Neuro/Psych: alert and oriented x 3, normal mood and affect. All available labs, radiology testing, previous records reviewed. SR on telemetry with PVCs. Cr elevated to 2 today, will defer cath until tomorrow. Stopping his pre cath fluids today and will hold diuresis as well. D/w his  daughter Vinnie Level NP by phone who agrees. ECGs reviewed together show less ST depressions in inferolateral leads but poor R wave progression anteriorly. Continue IV heparin.   Elouise Munroe, MD 02/17/22 9:36 AM

## 2022-02-17 NOTE — Plan of Care (Signed)
  Problem: Clinical Measurements: Goal: Respiratory complications will improve Outcome: Progressing Goal: Cardiovascular complication will be avoided Outcome: Progressing   Problem: Activity: Goal: Risk for activity intolerance will decrease Outcome: Progressing   Problem: Nutrition: Goal: Adequate nutrition will be maintained Outcome: Progressing   Problem: Coping: Goal: Level of anxiety will decrease Outcome: Progressing   Problem: Elimination: Goal: Will not experience complications related to urinary retention Outcome: Progressing   Problem: Elimination: Goal: Will not experience complications related to bowel motility Outcome: Not Progressing   

## 2022-02-17 NOTE — Progress Notes (Signed)
[place] for heparin Indication: chest pain/ACS  Allergies  Allergen Reactions   Other Shortness Of Breath    Esters   Robitussin Dm Max Day-Night Anaphylaxis and Swelling    Throat swelling   Banana Other (See Comments)    Severe indigestion   Contrast Media [Iodinated Contrast Media] Other (See Comments)    Just feel bad all over   Morphine And Related Other (See Comments)    Patient states he does not like to take Morphine as it gives him nightmares and hallucinations.     Patient Measurements: Height: '5\' 6"'$  (167.6 cm) Weight: 85.9 kg (189 lb 6 oz) IBW/kg (Calculated) : 63.8 Heparin Dosing Weight: 80kg  Vital Signs: Temp: 98 F (36.7 C) (08/21 0810) Temp Source: Oral (08/21 0810) BP: 133/69 (08/21 0810) Pulse Rate: 60 (08/21 0810)  Labs: Recent Labs    02/15/22 1406 02/15/22 1635 02/16/22 0109 02/16/22 1156 02/16/22 1919 02/17/22 0119 02/17/22 0811  HGB 11.3*  --  10.2*  --   --  10.7*  --   HCT 33.3*  --  29.7*  --   --  31.3*  --   PLT 191  --  184  --   --  173  --   APTT  --   --  58*   < > 71* 55* 70*  LABPROT  --   --  15.3*  --   --   --   --   INR  --   --  1.2  --   --   --   --   HEPARINUNFRC  --   --  >1.10*  --   --  0.77*  --   CREATININE 1.68*  --  1.76*  --   --  2.05*  --   TROPONINIHS 163* 277*  --   --   --   --   --    < > = values in this interval not displayed.     Estimated Creatinine Clearance: 25.1 mL/min (A) (by C-G formula based on SCr of 2.05 mg/dL (H)).   Assessment: 48 YOM presenting with SOB, hx CAD and elevated troponin, he is on Eliquis PTA for afib. Eliquis affecting heparin level so will utilize aPTT for monitoring until levels correlate. Last dose of apixaban 8/19 AM per med rec.   Repeat aPTT 70 while on heparin 1150 units/hr which is therapeutic. CBC stable with no signs of bleeding reported. Plan for [place] potentially 8/22.   Goal of Therapy:  Heparin level 0.3-0.7  units/ml aPTT 66-102 seconds Monitor platelets by anticoagulation protocol: Yes   Plan:  Continue heparin 1150 units/hr Daily heparin level, aPTT, and CBC  Erskine Speed, PharmD Please see amion for complete clinical pharmacist phone list 8/21/202310:41 AM

## 2022-02-17 NOTE — Plan of Care (Signed)
  Problem: Clinical Measurements: Goal: Respiratory complications will improve Outcome: Progressing Goal: Cardiovascular complication will be avoided Outcome: Progressing   Problem: Activity: Goal: Risk for activity intolerance will decrease Outcome: Progressing   Problem: Coping: Goal: Level of anxiety will decrease Outcome: Progressing   Problem: Elimination: Goal: Will not experience complications related to urinary retention Outcome: Progressing   Problem: Elimination: Goal: Will not experience complications related to bowel motility Outcome: Not Progressing

## 2022-02-17 NOTE — Progress Notes (Addendum)
[place] for heparin Indication: chest pain/ACS  Allergies  Allergen Reactions   Other Shortness Of Breath    Esters   Robitussin Dm Max Day-Night Anaphylaxis and Swelling    Throat swelling   Banana Other (See Comments)    Severe indigestion   Contrast Media [Iodinated Contrast Media] Other (See Comments)    Just feel bad all over   Morphine And Related Other (See Comments)    Patient states he does not like to take Morphine as it gives him nightmares and hallucinations.     Patient Measurements: Height: '5\' 6"'$  (167.6 cm) Weight: 85.9 kg (189 lb 6 oz) IBW/kg (Calculated) : 63.8 Heparin Dosing Weight: 80kg  Vital Signs: Temp: 97.8 F (36.6 C) (08/20 2300) Temp Source: Oral (08/20 2300) BP: 126/67 (08/20 1950)  Labs: Recent Labs    02/15/22 1406 02/15/22 1406 02/15/22 1635 02/16/22 0109 02/16/22 1156 02/16/22 1919 02/17/22 0119  HGB 11.3*  --   --  10.2*  --   --  10.7*  HCT 33.3*  --   --  29.7*  --   --  31.3*  PLT 191  --   --  184  --   --  173  APTT  --    < >  --  58* 69* 71* 55*  LABPROT  --   --   --  15.3*  --   --   --   INR  --   --   --  1.2  --   --   --   HEPARINUNFRC  --   --   --  >1.10*  --   --  0.77*  CREATININE 1.68*  --   --  1.76*  --   --  2.05*  TROPONINIHS 163*  --  277*  --   --   --   --    < > = values in this interval not displayed.     Estimated Creatinine Clearance: 25.1 mL/min (A) (by C-G formula based on SCr of 2.05 mg/dL (H)).   Assessment: 70 YOM presenting with SOB, hx CAD and elevated troponin, he is on Eliquis PTA for afib, chronic anemia stable, plts 184. Eliquis affecting heparin level so will utilize aPTT for monitoring until levels correlate. Last dose of apixaban 8/19 AM per med rec.   Heparin level 0.77 (affected by Eliquis), aPTT down to 55 sec (subtherapeutic) on infusion at 1150 units/hr. RN notes that heparin line was dislodged and unsure how long heparin was not running  -replaced at 11pm.  Goal of Therapy:  Heparin level 0.3-0.7 units/ml aPTT 66-102 seconds Monitor platelets by anticoagulation protocol: Yes   Plan:  Continue heparin 1150 units/hr F/u a.m. aPTT  Sherlon Handing, PharmD, BCPS Please see amion for complete clinical pharmacist phone list 8/21/20232:11 AM

## 2022-02-18 ENCOUNTER — Encounter (HOSPITAL_COMMUNITY): Payer: Self-pay | Admitting: Cardiovascular Disease

## 2022-02-18 ENCOUNTER — Encounter (HOSPITAL_COMMUNITY)
Admission: EM | Disposition: A | Discharge status: Home / Self Care | Payer: Self-pay | Source: Home / Self Care | Attending: Internal Medicine

## 2022-02-18 DIAGNOSIS — I249 Acute ischemic heart disease, unspecified: Secondary | ICD-10-CM | POA: Diagnosis not present

## 2022-02-18 DIAGNOSIS — I214 Non-ST elevation (NSTEMI) myocardial infarction: Secondary | ICD-10-CM | POA: Diagnosis not present

## 2022-02-18 DIAGNOSIS — I2510 Atherosclerotic heart disease of native coronary artery without angina pectoris: Secondary | ICD-10-CM | POA: Diagnosis not present

## 2022-02-18 HISTORY — PX: LEFT HEART CATH AND CORONARY ANGIOGRAPHY: CATH118249

## 2022-02-18 LAB — BASIC METABOLIC PANEL
Anion gap: 10 (ref 5–15)
BUN: 29 mg/dL — ABNORMAL HIGH (ref 8–23)
CO2: 23 mmol/L (ref 22–32)
Calcium: 8.6 mg/dL — ABNORMAL LOW (ref 8.9–10.3)
Chloride: 106 mmol/L (ref 98–111)
Creatinine, Ser: 1.75 mg/dL — ABNORMAL HIGH (ref 0.61–1.24)
GFR, Estimated: 37 mL/min — ABNORMAL LOW (ref >60)
Glucose, Bld: 128 mg/dL — ABNORMAL HIGH (ref 70–99)
Potassium: 4.2 mmol/L (ref 3.5–5.1)
Sodium: 139 mmol/L (ref 135–145)

## 2022-02-18 LAB — CBC
HCT: 30.6 % — ABNORMAL LOW (ref 39.0–52.0)
Hemoglobin: 10.6 g/dL — ABNORMAL LOW (ref 13.0–17.0)
MCH: 35.1 pg — ABNORMAL HIGH (ref 26.0–34.0)
MCHC: 34.6 g/dL (ref 30.0–36.0)
MCV: 101.3 fL — ABNORMAL HIGH (ref 80.0–100.0)
Platelets: 170 10*3/uL (ref 150–400)
RBC: 3.02 MIL/uL — ABNORMAL LOW (ref 4.22–5.81)
RDW: 13 % (ref 11.5–15.5)
WBC: 6.5 10*3/uL (ref 4.0–10.5)
nRBC: 0 % (ref 0.0–0.2)

## 2022-02-18 LAB — BRAIN NATRIURETIC PEPTIDE: B Natriuretic Peptide: 623.5 pg/mL — ABNORMAL HIGH (ref 0.0–100.0)

## 2022-02-18 LAB — HEPARIN LEVEL (UNFRACTIONATED): Heparin Unfractionated: 0.68 IU/mL (ref 0.30–0.70)

## 2022-02-18 LAB — APTT: aPTT: 76 seconds — ABNORMAL HIGH (ref 24–36)

## 2022-02-18 SURGERY — LEFT HEART CATH AND CORONARY ANGIOGRAPHY
Anesthesia: LOCAL

## 2022-02-18 MED ORDER — PSYLLIUM 95 % PO PACK
1.0000 | PACK | Freq: Every day | ORAL | Status: DC
  Administered 2022-02-18 – 2022-02-23 (×5): 1 via ORAL
  Filled 2022-02-18 (×6): qty 1

## 2022-02-18 MED ORDER — VERAPAMIL HCL 2.5 MG/ML IV SOLN
INTRAVENOUS | Status: DC | PRN
  Administered 2022-02-18: 10 mL via INTRA_ARTERIAL

## 2022-02-18 MED ORDER — SODIUM CHLORIDE 0.9 % IV SOLN: 250.0000 mL | INTRAVENOUS | Status: DC | PRN

## 2022-02-18 MED ORDER — HEPARIN (PORCINE) IN NACL 1000-0.9 UT/500ML-% IV SOLN
INTRAVENOUS | Status: DC | PRN
  Administered 2022-02-18 (×2): 500 mL

## 2022-02-18 MED ORDER — FENTANYL CITRATE (PF) 100 MCG/2ML IJ SOLN
INTRAMUSCULAR | Status: DC | PRN
  Administered 2022-02-18: 25 ug via INTRAVENOUS

## 2022-02-18 MED ORDER — FENTANYL CITRATE (PF) 100 MCG/2ML IJ SOLN
INTRAMUSCULAR | Status: AC
  Filled 2022-02-18: qty 2

## 2022-02-18 MED ORDER — ONDANSETRON HCL 4 MG/2ML IJ SOLN: 4.0000 mg | Freq: Four times a day (QID) | INTRAMUSCULAR | Status: DC | PRN

## 2022-02-18 MED ORDER — MIDAZOLAM HCL 2 MG/2ML IJ SOLN
INTRAMUSCULAR | Status: AC
  Filled 2022-02-18: qty 2

## 2022-02-18 MED ORDER — DIPHENHYDRAMINE HCL 50 MG/ML IJ SOLN: 25.0000 mg | Freq: Once | INTRAMUSCULAR | Status: DC

## 2022-02-18 MED ORDER — SODIUM CHLORIDE 0.9% FLUSH: 3.0000 mL | INTRAVENOUS | Status: DC | PRN

## 2022-02-18 MED ORDER — HEPARIN SODIUM (PORCINE) 1000 UNIT/ML IJ SOLN
INTRAMUSCULAR | Status: DC | PRN
  Administered 2022-02-18: 4000 [IU] via INTRAVENOUS

## 2022-02-18 MED ORDER — LABETALOL HCL 5 MG/ML IV SOLN: 10.0000 mg | INTRAVENOUS | Status: AC | PRN

## 2022-02-18 MED ORDER — MELATONIN 5 MG PO TABS
10.0000 mg | ORAL_TABLET | Freq: Every day | ORAL | Status: DC
  Administered 2022-02-18 – 2022-02-22 (×5): 10 mg via ORAL
  Filled 2022-02-18 (×5): qty 2

## 2022-02-18 MED ORDER — VERAPAMIL HCL 2.5 MG/ML IV SOLN
INTRAVENOUS | Status: AC
  Filled 2022-02-18: qty 2

## 2022-02-18 MED ORDER — SODIUM CHLORIDE 0.9 % IV SOLN: INTRAVENOUS | Status: AC

## 2022-02-18 MED ORDER — HEPARIN (PORCINE) IN NACL 1000-0.9 UT/500ML-% IV SOLN
INTRAVENOUS | Status: AC
  Filled 2022-02-18: qty 1000

## 2022-02-18 MED ORDER — HEPARIN (PORCINE) 25000 UT/250ML-% IV SOLN
1200.0000 [IU]/h | INTRAVENOUS | Status: DC
  Administered 2022-02-18 – 2022-02-19 (×2): 1100 [IU]/h via INTRAVENOUS
  Filled 2022-02-18 (×3): qty 250

## 2022-02-18 MED ORDER — PREDNISONE 10 MG PO TABS
60.0000 mg | ORAL_TABLET | Freq: Once | ORAL | Status: AC
Start: 2022-02-18 — End: 2022-02-18
  Administered 2022-02-18: 60 mg via ORAL
  Filled 2022-02-18: qty 1

## 2022-02-18 MED ORDER — LIDOCAINE HCL (PF) 1 % IJ SOLN
INTRAMUSCULAR | Status: DC | PRN
  Administered 2022-02-18: 2 mL

## 2022-02-18 MED ORDER — HEPARIN SODIUM (PORCINE) 1000 UNIT/ML IJ SOLN
INTRAMUSCULAR | Status: AC
Start: 2022-02-18 — End: ?
  Filled 2022-02-18: qty 10

## 2022-02-18 MED ORDER — SODIUM CHLORIDE 0.9% FLUSH
3.0000 mL | Freq: Two times a day (BID) | INTRAVENOUS | Status: DC
  Administered 2022-02-19 – 2022-02-21 (×3): 3 mL via INTRAVENOUS

## 2022-02-18 MED ORDER — HYDRALAZINE HCL 20 MG/ML IJ SOLN: 10.0000 mg | INTRAMUSCULAR | Status: AC | PRN

## 2022-02-18 MED ORDER — MIDAZOLAM HCL 2 MG/2ML IJ SOLN
INTRAMUSCULAR | Status: DC | PRN
  Administered 2022-02-18: 1 mg via INTRAVENOUS

## 2022-02-18 MED ORDER — ASPIRIN 81 MG PO CHEW
81.0000 mg | CHEWABLE_TABLET | ORAL | Status: AC
Start: 2022-02-18 — End: 2022-02-18
  Administered 2022-02-18: 81 mg via ORAL
  Filled 2022-02-18: qty 1

## 2022-02-18 MED ORDER — LIDOCAINE HCL (PF) 1 % IJ SOLN
INTRAMUSCULAR | Status: AC
  Filled 2022-02-18: qty 30

## 2022-02-18 SURGICAL SUPPLY — 11 items
BAND ZEPHYR COMPRESS 30 LONG (HEMOSTASIS) IMPLANT
CATH INFINITI 5 FR JL3.5 (CATHETERS) IMPLANT
CATH INFINITI JR4 5F (CATHETERS) IMPLANT
GLIDESHEATH SLEND SS 6F .021 (SHEATH) IMPLANT
GUIDEWIRE ANGLED .035X150CM (WIRE) IMPLANT
GUIDEWIRE INQWIRE 1.5J.035X260 (WIRE) IMPLANT
INQWIRE 1.5J .035X260CM (WIRE) ×1
KIT HEART LEFT (KITS) ×1 IMPLANT
PACK CARDIAC CATHETERIZATION (CUSTOM PROCEDURE TRAY) ×1 IMPLANT
TRANSDUCER W/STOPCOCK (MISCELLANEOUS) ×1 IMPLANT
TUBING CIL FLEX 10 FLL-RA (TUBING) ×1 IMPLANT

## 2022-02-18 NOTE — Progress Notes (Signed)
[place] for heparin Indication: chest pain/ACS  Allergies  Allergen Reactions   Other Shortness Of Breath    Esters   Robitussin Dm Max Day-Night Anaphylaxis and Swelling    Throat swelling   Banana Other (See Comments)    Severe indigestion   Contrast Media [Iodinated Contrast Media] Other (See Comments)    Just feel bad all over   Morphine And Related Other (See Comments)    Patient states he does not like to take Morphine as it gives him nightmares and hallucinations.     Patient Measurements: Height: '5\' 6"'$  (167.6 cm) Weight: 85 kg (187 lb 6.3 oz) IBW/kg (Calculated) : 63.8 Heparin Dosing Weight: 80kg  Vital Signs: Temp: 97.9 F (36.6 C) (08/22 0825) Temp Source: Oral (08/22 0825) BP: 149/66 (08/22 0825) Pulse Rate: 56 (08/22 0825)  Labs: Recent Labs    02/15/22 1406 02/15/22 1635 02/16/22 0109 02/16/22 1156 02/17/22 0119 02/17/22 0811 02/18/22 0036  HGB 11.3*  --  10.2*  --  10.7*  --  10.6*  HCT 33.3*  --  29.7*  --  31.3*  --  30.6*  PLT 191  --  184  --  173  --  170  APTT  --   --  58*   < > 55* 70* 76*  LABPROT  --   --  15.3*  --   --   --   --   INR  --   --  1.2  --   --   --   --   HEPARINUNFRC  --   --  >1.10*  --  0.77*  --  0.68  CREATININE 1.68*  --  1.76*  --  2.05*  --  1.75*  TROPONINIHS 163* 277*  --   --   --   --   --    < > = values in this interval not displayed.     Estimated Creatinine Clearance: 29.3 mL/min (A) (by C-G formula based on SCr of 1.75 mg/dL (H)).   Assessment: 27 YOM presenting with SOB, hx CAD and elevated troponin, he is on Eliquis PTA for afib. Last dose of apixaban 8/19 AM per med rec.   Repeat aPTT 76 while on heparin 1150 units/hr which is therapeutic. Heparin level 0.68 which is therapeutic. Will plan for another 24-48 hours of assessing both aPTT and heparin level to ensure correlating. CBC stable with no signs of bleeding reported. Plan for [place] potentially 8/22.    Goal of Therapy:  Heparin level 0.3-0.7 units/ml aPTT 66-102 seconds Monitor platelets by anticoagulation protocol: Yes   Plan:  Continue heparin 1150 units/hr Daily heparin level, aPTT, and CBC  Erskine Speed, PharmD Please see amion for complete clinical pharmacist phone list 02/18/2022 9:44 AM

## 2022-02-18 NOTE — Progress Notes (Signed)
[place] for heparin Indication: chest pain/ACS  Allergies  Allergen Reactions   Other Shortness Of Breath    Esters   Robitussin Dm Max Day-Night Anaphylaxis and Swelling    Throat swelling   Banana Other (See Comments)    Severe indigestion   Contrast Media [Iodinated Contrast Media] Other (See Comments)    Just feel bad all over   Morphine And Related Other (See Comments)    Patient states he does not like to take Morphine as it gives him nightmares and hallucinations.     Patient Measurements: Height: '5\' 6"'$  (517.0 cm) Weight: 85 kg (187 lb 6.3 oz) IBW/kg (Calculated) : 63.8 Heparin Dosing Weight: 80kg  Vital Signs: Temp: 97.3 F (36.3 C) (08/22 1631) Temp Source: Axillary (08/22 1631) BP: 159/59 (08/22 1631) Pulse Rate: 71 (08/22 1631)  Labs: Recent Labs    02/16/22 0109 02/16/22 1156 02/17/22 0119 02/17/22 0811 02/18/22 0036  HGB 10.2*  --  10.7*  --  10.6*  HCT 29.7*  --  31.3*  --  30.6*  PLT 184  --  173  --  170  APTT 58*   < > 55* 70* 76*  LABPROT 15.3*  --   --   --   --   INR 1.2  --   --   --   --   HEPARINUNFRC >1.10*  --  0.77*  --  0.68  CREATININE 1.76*  --  2.05*  --  1.75*   < > = values in this interval not displayed.     Estimated Creatinine Clearance: 29.3 mL/min (A) (by C-G formula based on SCr of 1.75 mg/dL (H)).   Assessment: 68 YOM presenting with SOB, hx CAD and elevated troponin, he is on Eliquis PTA for afib. Last dose of apixaban 8/19 AM per med rec.   Orders to restart heparin post cath this evening.   Goal of Therapy:  Heparin level 0.3-0.7 units/ml aPTT 66-102 seconds Monitor platelets by anticoagulation protocol: Yes   Plan:  Restart heparin at 1100 units/hr Daily heparin level, aPTT, and CBC  Erin Hearing PharmD., BCPS Clinical Pharmacist 02/18/2022 8:16 PM

## 2022-02-18 NOTE — Progress Notes (Signed)
Pt had minimal bleeding from TR band b/f RN had begun taking any air out of band. RN called cath lab, who came over to assess pt. RN will wait an hour before attempting to remove any air. Pt comfortable and not in any distress. RN continuing to monitor.   1400 RN had to call cath lab again b/c pt's TR band was actively bleeding. Cath lab RN came and put new TR band on. Pt comfortable. RN continuing to monitor.

## 2022-02-18 NOTE — TOC Progression Note (Signed)
Transition of Care [facility]) - Progression Note    Patient Details  Name: Paul Kramer MRN: [id number] Date of Birth: 03/16/1930  Transition of Care [facility]) CM/SW [place], RN Phone [phone]  02/18/2022, 3:36 PM  Clinical Narrative:     Transition of Care [facility]) Screening Note   Patient Details  Name: Paul Kramer Date of Birth: [birth year]   Transition of Care [facility]) CM/SW Contact:    Angelita Ingles, RN Phone Number: 02/18/2022, 3:37 PM    Transition of Care Department [facility]) has reviewed patient and no TOC needs have been identified at this time. We will continue to monitor patient advancement through interdisciplinary progression rounds.           Expected Discharge Plan and Services                                                 Social Determinants of Health (SDOH) Interventions    Readmission Risk Interventions     No data to display

## 2022-02-18 NOTE — Progress Notes (Signed)
Called to 2c16 for bleeding at arterial radial site. No hematoma was noted upon arrival to unit. Moderate amount of ecchymosis was noted around the zephryr band; site soft to touch. Some residual blood was noted from initial zephyr band placement. Site was cleaned to access for active bleeding; none noted at this time. Zephyr band appears to be in correct position. 1 cc of air was added to band making the total amount in the band 20 cc. Right arm was elevated above heart level. Precautions explained to RN, patient, and family.Cindee Salt

## 2022-02-18 NOTE — Progress Notes (Addendum)
Progress Note  Patient Name: Paul Kramer Date of Encounter: 02/18/2022  [facility] Cardiologist: Glenetta Hew, MD   Subjective   Patient is doing well this AM. Denies chest pain, sob. Is somewhat annoyed by how often he has been going to the bathroom and he feels constipated.  Inpatient Medications    Scheduled Meds:  albuterol  2.5 mg Nebulization BID   allopurinol  100 mg Oral Daily   aspirin EC  81 mg Oral Daily   atorvastatin  40 mg Oral QHS   clopidogrel  75 mg Oral Daily   cyanocobalamin  5,000 mcg Oral Daily   fluticasone  1 spray Each Nare Daily   melatonin  10 mg Oral Daily   metoprolol tartrate  12.5 mg Oral BID   multivitamin with minerals  1 tablet Oral Daily   sodium chloride flush  3 mL Intravenous Q12H   terazosin  4 mg Oral QPM   Continuous Infusions:  sodium chloride     sodium chloride 10 mL/hr at 02/18/22 0327   heparin 1,150 Units/hr (02/18/22 0327)   PRN Meds: sodium chloride, acetaminophen, nitroGLYCERIN, mouth rinse, sodium chloride flush   Vital Signs    Vitals:   02/17/22 2000 02/17/22 2054 02/17/22 2300 02/18/22 0300  BP: (!) 140/61  138/62 (!) 124/57  Pulse: 60  61 61  Resp: '17  17 17  '$ Temp: 98 F (36.7 C)  98.2 F (36.8 C) 98.1 F (36.7 C)  TempSrc: Oral  Oral Oral  SpO2: 94% 94% 94% 93%  Weight:    85 kg  Height:        Intake/Output Summary (Last 24 hours) at 02/18/2022 0742 Last data filed at 02/18/2022 0616 Gross per 24 hour  Intake 1278.02 ml  Output 1430 ml  Net -151.98 ml      02/18/2022    3:00 AM 02/16/2022    5:00 AM 02/15/2022   10:57 PM  Last 3 Weights  Weight (lbs) 187 lb 6.3 oz 189 lb 6 oz 189 lb 6 oz  Weight (kg) 85 kg 85.9 kg 85.9 kg      Telemetry    Sinus rhythm, HR in the 50s-60s - Personally Reviewed  ECG    Sinus rhythm, first degree AV block, nonspecific ST and T wave changes - Personally Reviewed  Physical Exam   GEN: No acute distress.  Sitting comfortably in the bed  Neck: No  JVD Cardiac: RRR, no murmurs, rubs, or gallops. Radial pulses 2+ bilaterally  Respiratory: Clear to auscultation bilaterally. GI: Soft, mildly tender in the left lower quadrant  MS: No edema; No deformity. Neuro:  Nonfocal  Psych: Normal affect   Labs    High Sensitivity Troponin:   Recent Labs  Lab 02/15/22 1406 02/15/22 1635  TROPONINIHS 163* 277*     Chemistry Recent Labs  Lab 02/15/22 1406 02/16/22 0109 02/17/22 0119 02/18/22 0036  NA 141 142 138 139  K 4.5 3.8 4.0 4.2  CL 110 111 106 106  CO2 '22 22 23 23  '$ GLUCOSE 160* 115* 120* 128*  BUN 34* 30* 31* 29*  CREATININE 1.68* 1.76* 2.05* 1.75*  CALCIUM 8.5* 8.4* 8.3* 8.6*  MG  --  2.0  --   --   PROT 6.4*  --   --   --   ALBUMIN 4.0  --   --   --   AST 17  --   --   --   ALT 14  --   --   --  ALKPHOS 92  --   --   --   BILITOT 0.8  --   --   --   GFRNONAA 39* 37* 30* 37*  ANIONGAP '9 9 9 10    '$ Lipids  Recent Labs  Lab 02/16/22 0109  CHOL 110  TRIG 60  HDL 43  LDLCALC 55  CHOLHDL 2.6    Hematology Recent Labs  Lab 02/16/22 0109 02/17/22 0119 02/18/22 0036  WBC 6.7 6.8 6.5  RBC 2.93* 3.05* 3.02*  HGB 10.2* 10.7* 10.6*  HCT 29.7* 31.3* 30.6*  MCV 101.4* 102.6* 101.3*  MCH 34.8* 35.1* 35.1*  MCHC 34.3 34.2 34.6  RDW 13.2 13.2 13.0  PLT 184 173 170   Thyroid  Recent Labs  Lab 02/16/22 0109  TSH 5.579*  FREET4 1.10    BNP Recent Labs  Lab 02/15/22 1407 02/18/22 0036  BNP 1,060.9* 623.5*    DDimer No results for input(s): "DDIMER" in the last 168 hours.   Radiology    ECHOCARDIOGRAM COMPLETE  Result Date: 02/16/2022    ECHOCARDIOGRAM REPORT   Patient Name:   Paul Kramer Date of Exam: 02/16/2022 Medical Rec #:  [id number]     Height:       66.0 in Accession #:    [id number]    Weight:       189.4 lb Date of Birth:  01/01/1930    BSA:          1.954 m Patient Age:    88 years      BP:           152/76 mmHg Patient Gender: M             HR:           76 bpm. Exam Location:  Inpatient  Procedure: 2D Echo, Cardiac Doppler and Color Doppler Indications:    NSTEMI  History:        Patient has prior history of Echocardiogram examinations, most                 recent 01/20/2022. Cardiomyopathy, Acute MI, Previous Myocardial                 Infarction and CAD, Stroke, Arrythmias:Atrial Fibrillation; Risk                 Factors:Hypertension and Dyslipidemia.  Sonographer:    Wenda Low Referring Phys: [id number] [place]  1. Left ventricular ejection fraction, by estimation, is 45 to 50%. The left ventricle has low normal function. The left ventricle has no regional wall motion abnormalities. There is mild left ventricular hypertrophy. Left ventricular diastolic parameters are consistent with Grade II diastolic dysfunction (pseudonormalization). Elevated left atrial pressure.  2. Right ventricular systolic function is normal. The right ventricular size is normal.  3. Left atrial size was moderately dilated.  4. The mitral valve is abnormal. Mild mitral valve regurgitation. No evidence of mitral stenosis.  5. The aortic valve is tricuspid. There is moderate calcification of the aortic valve. There is moderate thickening of the aortic valve. Aortic valve regurgitation is mild to moderate.  6. Aortic dilatation noted. There is mild dilatation of the ascending aorta, measuring 40 mm.  7. The inferior vena cava is normal in size with greater than 50% respiratory variability, suggesting right atrial pressure of 3 mmHg. FINDINGS  Left Ventricle: Left ventricular ejection fraction, by estimation, is 45 to 50%. The left ventricle has low normal function. The left ventricle has no  regional wall motion abnormalities. The left ventricular internal cavity size was normal in size. There is mild left ventricular hypertrophy. Left ventricular diastolic parameters are consistent with Grade II diastolic dysfunction (pseudonormalization). Elevated left atrial pressure. Right Ventricle: The right  ventricular size is normal. Right vetricular wall thickness was not well visualized. Right ventricular systolic function is normal. Left Atrium: Left atrial size was moderately dilated. Right Atrium: Right atrial size was normal in size. Pericardium: There is no evidence of pericardial effusion. Mitral Valve: The mitral valve is abnormal. There is mild thickening of the mitral valve leaflet(s). There is mild calcification of the mitral valve leaflet(s). Mild mitral annular calcification. Mild mitral valve regurgitation. No evidence of mitral valve stenosis. MV peak gradient, 7.4 mmHg. The mean mitral valve gradient is 2.0 mmHg. Tricuspid Valve: The tricuspid valve is normal in structure. Tricuspid valve regurgitation is not demonstrated. No evidence of tricuspid stenosis. Aortic Valve: The aortic valve is tricuspid. There is moderate calcification of the aortic valve. There is moderate thickening of the aortic valve. There is moderate aortic valve annular calcification. Aortic valve regurgitation is mild to moderate. Aortic regurgitation PHT measures 447 msec. Aortic valve mean gradient measures 5.0 mmHg. Aortic valve peak gradient measures 10.1 mmHg. Aortic valve area, by VTI measures 1.76 cm. Pulmonic Valve: The pulmonic valve was not well visualized. Pulmonic valve regurgitation is not visualized. No evidence of pulmonic stenosis. Aorta: The aortic root is normal in size and structure and aortic dilatation noted. There is mild dilatation of the ascending aorta, measuring 40 mm. Venous: The inferior vena cava is normal in size with greater than 50% respiratory variability, suggesting right atrial pressure of 3 mmHg. IAS/Shunts: No atrial level shunt detected by color flow Doppler.  LEFT VENTRICLE PLAX 2D LVIDd:         4.90 cm      Diastology LVIDs:         3.85 cm      LV e' medial:    5.87 cm/s LV PW:         1.20 cm      LV E/e' medial:  14.6 LV IVS:        1.20 cm      LV e' lateral:   6.96 cm/s LVOT diam:      2.00 cm      LV E/e' lateral: 12.3 LV SV:         74 LV SV Index:   38 LVOT Area:     3.14 cm  LV Volumes (MOD) LV vol d, MOD A2C: 114.0 ml LV vol d, MOD A4C: 107.0 ml LV vol s, MOD A2C: 66.3 ml LV vol s, MOD A4C: 66.1 ml LV SV MOD A2C:     47.7 ml LV SV MOD A4C:     107.0 ml LV SV MOD BP:      47.6 ml RIGHT VENTRICLE RV Basal diam:  3.85 cm RV Mid diam:    4.00 cm RV S prime:     16.20 cm/s TAPSE (M-mode): 2.7 cm LEFT ATRIUM             Index        RIGHT ATRIUM           Index LA diam:        4.40 cm 2.25 cm/m   RA Area:     16.50 cm LA Vol (A2C):   76.3 ml 39.05 ml/m  RA Volume:   49.70 ml  25.44  ml/m LA Vol (A4C):   98.6 ml 50.46 ml/m LA Biplane Vol: 87.4 ml 44.73 ml/m  AORTIC VALVE                     PULMONIC VALVE AV Area (Vmax):    1.50 cm      PV Vmax:       0.60 m/s AV Area (Vmean):   1.54 cm      PV Peak grad:  1.4 mmHg AV Area (VTI):     1.76 cm AV Vmax:           159.00 cm/s AV Vmean:          104.500 cm/s AV VTI:            0.424 m AV Peak Grad:      10.1 mmHg AV Mean Grad:      5.0 mmHg LVOT Vmax:         75.70 cm/s LVOT Vmean:        51.300 cm/s LVOT VTI:          0.237 m LVOT/AV VTI ratio: 0.56 AI PHT:            447 msec  AORTA Ao Root diam: 3.60 cm Ao Asc diam:  4.00 cm MITRAL VALVE MV Area (PHT): 3.85 cm    SHUNTS MV Area VTI:   1.95 cm    Systemic VTI:  0.24 m MV Peak grad:  7.4 mmHg    Systemic Diam: 2.00 cm MV Mean grad:  2.0 mmHg MV Vmax:       1.36 m/s MV Vmean:      58.3 cm/s MV Decel Time: 197 msec MV E velocity: 85.90 cm/s MV A velocity: 66.80 cm/s MV E/A ratio:  1.29 Carlyle Dolly MD Electronically signed by Carlyle Dolly MD Signature Date/Time: 02/16/2022/5:19:05 PM    Final     Cardiac Studies   Echocardiogram 01/20/2022      1. Left ventricular ejection fraction by 3D volume is 51 %. The left  ventricle has low normal function. The left ventricle has no regional wall  motion abnormalities. Left ventricular diastolic parameters are consistent  with Grade II  diastolic  dysfunction (pseudonormalization).   2. Right ventricular systolic function is normal. The right ventricular  size is normal.   3. Left atrial size was moderately dilated.   4. The mitral valve is normal in structure. Mild to moderate mitral valve  regurgitation. No evidence of mitral stenosis.   5. The aortic valve is tricuspid. The right cusp appears to be  restrictive in motion. Aortic valve regurgitation is moderate. Mild aortic  valve stenosis. Aortic regurgitation PHT measures 547 msec.   6. There is mild dilatation of the aortic root, measuring 37 mm. There is  moderate dilatation of the ascending aorta, measuring 43 mm.   7. The inferior vena cava is normal in size with greater than 50%  respiratory variability, suggesting right atrial pressure of 3 mmHg.      Cardiac catheterization 12/07/2017   LESION #1 & 1.5: Mid Cx to Dist Cx lesion is 95% stenosed with 55% stenosed side branch in Ost 2nd Mrg. Scoring balloon angioplasty was performed on the side branch using a BALLOON WOLVERINE 2.50X10. Post intervention, the side branch was reduced to 20% residual stenosis. A drug-eluting stent was successfully placed in the main Circumflex using a STENT SYNERGY DES 3X16. -Postdilated to 3.3 mm Post intervention, there is a 0% residual stenosis. LESION #  2: Prox LAD lesion is 85% stenosed. A drug-eluting stent was successfully placed using a STENT SYNERGY DES 3X16. - Post-dilated to 3.6 mm Post intervention, there is a 0% residual stenosis. _______________________________________________ LV end diastolic pressure is moderately elevated.   Successful DES PCI of mid-dist Cx across 2nd Mrg & AVG Cx -Synergy DES 3.0 mm x 16 mm (3.3 mm) with Cutting Balloon PTCA of ostial 2nd Mrg using a 2.5 mm Wolverine balloon. Successful PCI of Ost LAD to Prox LAD lesion is 85% stenosed.-With a Synergy DES 3.0 mm x 16 mm postdilated to 3.6 mm  Moderately Elevated LVEDP     Transfer to 6  Central post procedure unit for sheath removal with manual pressure. Monitor renal function and hemoglobin/hematocrit Dual antiplatelet therapy for minimum 1 year     Anticipate discharge in about 2 days if renal function stable.   Diagnostic Dominance: Right  Intervention     Patient Profile     88 y.o. male with extensive history of CAD (previous STEMI, stents to proximal LAD and mid LCx 2019, chronic total occlusion of RCA), presents with acute on chronic exacerbation of heart failure with mildly reduced left ventricular systolic function (EF 45% by echo July 2023), on background of hypertension, hypercholesterolemia, paroxysmal atrial fibrillation on chronic anticoagulation, CKD stage IIIb, BPH  Assessment & Plan    Acute on Chronic combined Systolic and Diastolic CHF  Ischemic Cardiomyopathy  - Echo this admission showed EV 45-50%, no regional wall motion abnormalities, grade II diastolic dysfunction, normal RV systolic function  - Patient presented with evidence of hypervolemia-- BNP elevated to 1060, CXR with mild, diffuse bilateral Insterstitial opacity and small bilateral pleural effusions - Held lasix yesterday to protect renal function prior to cath today-- even off lasix, patient put out 1.43 L urine and is net -2 L since admission  - Heart cath yesterday got postponed due to worsening creatinine-- today, creatinine improved from 2.05 to 1.75 (baseline creatinine approx 1.56-1.76)  - Continue metoprolol 12.5 mg BID - Additional GDMT limited by renal function - Note that even when patient is not taking lasix, he has good urine output. Takes PRN lasix at home, but he forgot it when he went to the beach so he got volume overloaded. I suspect we can discharge him on PRN lasix    CAD  Elevated Trop  - Note that patient had EKG changes upon presentation, trop peaked around 250  - Possible that trop elevation and EKG changes are due to demand infarction in the setting of heart  failure. However, also possible that there is a larger area of ischemic myocardium that is causing patient's decompensation - Patient agreeable to [place]-- postponed yesterday due to elevated creatinine. Creatinine down to 1.75 today. If patient does need intervention, likely it will have to be a staged procedure to limit use of contrast  - Patient has a documented contrast allergy-- makes him "feel bad all over."  He and his daughter both report that he has not been premedicated prior to his past 2 heart caths and did not have an adverse reaction to contrast.  To prevent any reaction, we have ordered a one time dose of prednisone prior to cath  - See progress note from 8/20 for documented consent for catheterization  - Continue ASA, plavix, metoprolol 12.5 mg BID, lipitor 40 mg daily   Paroxysmal Atrial Fibrillation  - Patient is maintaining rhythm per telemetry with HR in the 50s  - Eliquis held and patient  is on IV heparin for cath  - Continue metoprolol as above    AKI on CKD stage IIIb - Creatinine ranged from 1.56- 1.76 for the past month. Now 1.75 - Avoid nephrotoxic medications   For questions or updates, please contact [place] Please consult [email or web address] for contact info under        Signed, Margie Billet, PA-C  02/18/2022, 7:42 AM    Patient seen and examined with Vikki Ports PA-C.  Agree as above, with the following exceptions and changes as noted below.  No chest pain, anticipating cath today. Gen: NAD, CV: RRR, no murmurs, Lungs: clear, Abd: soft, Extrem: Warm, well perfused, no edema, Neuro/Psych: alert and oriented x 3, normal mood and affect. All available labs, radiology testing, previous records reviewed.  Seen with his daughter Vinnie Level at the bedside.  Plan for coronary angiography today.  Appears euvolemic now and is at baseline renal function.  We will plan to keep him overnight to monitor both renal function and volume status in the morning, agree that he  may benefit from as needed Lasix home-going.  His daughter feels like this incident was due to not having his Lasix prescription at the beach and perhaps having some dietary indiscretions.  Consent for cath obtained by prior team, see documentation.  Elouise Munroe, MD 02/18/22 8:59 AM

## 2022-02-18 NOTE — Interval H&P Note (Signed)
History and Physical Interval Note:  02/18/2022 11:32 AM  Paul Kramer  has presented today for surgery, with the diagnosis of NSTEMI.  The various methods of treatment have been discussed with the patient and family. After consideration of risks, benefits and other options for treatment, the patient has consented to  Procedure(s): LEFT HEART CATH AND CORONARY ANGIOGRAPHY (N/A) as a surgical intervention.  The patient's history has been reviewed, patient examined, no change in status, stable for surgery.  I have reviewed the patient's chart and labs.  Questions were answered to the patient's satisfaction.     [name]

## 2022-02-18 NOTE — H&P (View-Only) (Signed)
Progress Note  Patient Name: Paul Kramer Date of Encounter: 02/18/2022  [facility] Cardiologist: Glenetta Hew, MD   Subjective   Patient is doing well this AM. Denies chest pain, sob. Is somewhat annoyed by how often he has been going to the bathroom and he feels constipated.  Inpatient Medications    Scheduled Meds:  albuterol  2.5 mg Nebulization BID   allopurinol  100 mg Oral Daily   aspirin EC  81 mg Oral Daily   atorvastatin  40 mg Oral QHS   clopidogrel  75 mg Oral Daily   cyanocobalamin  5,000 mcg Oral Daily   fluticasone  1 spray Each Nare Daily   melatonin  10 mg Oral Daily   metoprolol tartrate  12.5 mg Oral BID   multivitamin with minerals  1 tablet Oral Daily   sodium chloride flush  3 mL Intravenous Q12H   terazosin  4 mg Oral QPM   Continuous Infusions:  sodium chloride     sodium chloride 10 mL/hr at 02/18/22 0327   heparin 1,150 Units/hr (02/18/22 0327)   PRN Meds: sodium chloride, acetaminophen, nitroGLYCERIN, mouth rinse, sodium chloride flush   Vital Signs    Vitals:   02/17/22 2000 02/17/22 2054 02/17/22 2300 02/18/22 0300  BP: (!) 140/61  138/62 (!) 124/57  Pulse: 60  61 61  Resp: '17  17 17  '$ Temp: 98 F (36.7 C)  98.2 F (36.8 C) 98.1 F (36.7 C)  TempSrc: Oral  Oral Oral  SpO2: 94% 94% 94% 93%  Weight:    85 kg  Height:        Intake/Output Summary (Last 24 hours) at 02/18/2022 0742 Last data filed at 02/18/2022 0616 Gross per 24 hour  Intake 1278.02 ml  Output 1430 ml  Net -151.98 ml      02/18/2022    3:00 AM 02/16/2022    5:00 AM 02/15/2022   10:57 PM  Last 3 Weights  Weight (lbs) 187 lb 6.3 oz 189 lb 6 oz 189 lb 6 oz  Weight (kg) 85 kg 85.9 kg 85.9 kg      Telemetry    Sinus rhythm, HR in the 50s-60s - Personally Reviewed  ECG    Sinus rhythm, first degree AV block, nonspecific ST and T wave changes - Personally Reviewed  Physical Exam   GEN: No acute distress.  Sitting comfortably in the bed  Neck: No  JVD Cardiac: RRR, no murmurs, rubs, or gallops. Radial pulses 2+ bilaterally  Respiratory: Clear to auscultation bilaterally. GI: Soft, mildly tender in the left lower quadrant  MS: No edema; No deformity. Neuro:  Nonfocal  Psych: Normal affect   Labs    High Sensitivity Troponin:   Recent Labs  Lab 02/15/22 1406 02/15/22 1635  TROPONINIHS 163* 277*     Chemistry Recent Labs  Lab 02/15/22 1406 02/16/22 0109 02/17/22 0119 02/18/22 0036  NA 141 142 138 139  K 4.5 3.8 4.0 4.2  CL 110 111 106 106  CO2 '22 22 23 23  '$ GLUCOSE 160* 115* 120* 128*  BUN 34* 30* 31* 29*  CREATININE 1.68* 1.76* 2.05* 1.75*  CALCIUM 8.5* 8.4* 8.3* 8.6*  MG  --  2.0  --   --   PROT 6.4*  --   --   --   ALBUMIN 4.0  --   --   --   AST 17  --   --   --   ALT 14  --   --   --  ALKPHOS 92  --   --   --   BILITOT 0.8  --   --   --   GFRNONAA 39* 37* 30* 37*  ANIONGAP '9 9 9 10    '$ Lipids  Recent Labs  Lab 02/16/22 0109  CHOL 110  TRIG 60  HDL 43  LDLCALC 55  CHOLHDL 2.6    Hematology Recent Labs  Lab 02/16/22 0109 02/17/22 0119 02/18/22 0036  WBC 6.7 6.8 6.5  RBC 2.93* 3.05* 3.02*  HGB 10.2* 10.7* 10.6*  HCT 29.7* 31.3* 30.6*  MCV 101.4* 102.6* 101.3*  MCH 34.8* 35.1* 35.1*  MCHC 34.3 34.2 34.6  RDW 13.2 13.2 13.0  PLT 184 173 170   Thyroid  Recent Labs  Lab 02/16/22 0109  TSH 5.579*  FREET4 1.10    BNP Recent Labs  Lab 02/15/22 1407 02/18/22 0036  BNP 1,060.9* 623.5*    DDimer No results for input(s): "DDIMER" in the last 168 hours.   Radiology    ECHOCARDIOGRAM COMPLETE  Result Date: 02/16/2022    ECHOCARDIOGRAM REPORT   Patient Name:   Paul Kramer Date of Exam: 02/16/2022 Medical Rec #:  [id number]     Height:       66.0 in Accession #:    [id number]    Weight:       189.4 lb Date of Birth:  03-30-1930    BSA:          1.954 m Patient Age:    88 years      BP:           152/76 mmHg Patient Gender: M             HR:           76 bpm. Exam Location:  Inpatient  Procedure: 2D Echo, Cardiac Doppler and Color Doppler Indications:    NSTEMI  History:        Patient has prior history of Echocardiogram examinations, most                 recent 01/20/2022. Cardiomyopathy, Acute MI, Previous Myocardial                 Infarction and CAD, Stroke, Arrythmias:Atrial Fibrillation; Risk                 Factors:Hypertension and Dyslipidemia.  Sonographer:    Wenda Low Referring Phys: [id number] Standard  1. Left ventricular ejection fraction, by estimation, is 45 to 50%. The left ventricle has low normal function. The left ventricle has no regional wall motion abnormalities. There is mild left ventricular hypertrophy. Left ventricular diastolic parameters are consistent with Grade II diastolic dysfunction (pseudonormalization). Elevated left atrial pressure.  2. Right ventricular systolic function is normal. The right ventricular size is normal.  3. Left atrial size was moderately dilated.  4. The mitral valve is abnormal. Mild mitral valve regurgitation. No evidence of mitral stenosis.  5. The aortic valve is tricuspid. There is moderate calcification of the aortic valve. There is moderate thickening of the aortic valve. Aortic valve regurgitation is mild to moderate.  6. Aortic dilatation noted. There is mild dilatation of the ascending aorta, measuring 40 mm.  7. The inferior vena cava is normal in size with greater than 50% respiratory variability, suggesting right atrial pressure of 3 mmHg. FINDINGS  Left Ventricle: Left ventricular ejection fraction, by estimation, is 45 to 50%. The left ventricle has low normal function. The left ventricle has no  regional wall motion abnormalities. The left ventricular internal cavity size was normal in size. There is mild left ventricular hypertrophy. Left ventricular diastolic parameters are consistent with Grade II diastolic dysfunction (pseudonormalization). Elevated left atrial pressure. Right Ventricle: The right  ventricular size is normal. Right vetricular wall thickness was not well visualized. Right ventricular systolic function is normal. Left Atrium: Left atrial size was moderately dilated. Right Atrium: Right atrial size was normal in size. Pericardium: There is no evidence of pericardial effusion. Mitral Valve: The mitral valve is abnormal. There is mild thickening of the mitral valve leaflet(s). There is mild calcification of the mitral valve leaflet(s). Mild mitral annular calcification. Mild mitral valve regurgitation. No evidence of mitral valve stenosis. MV peak gradient, 7.4 mmHg. The mean mitral valve gradient is 2.0 mmHg. Tricuspid Valve: The tricuspid valve is normal in structure. Tricuspid valve regurgitation is not demonstrated. No evidence of tricuspid stenosis. Aortic Valve: The aortic valve is tricuspid. There is moderate calcification of the aortic valve. There is moderate thickening of the aortic valve. There is moderate aortic valve annular calcification. Aortic valve regurgitation is mild to moderate. Aortic regurgitation PHT measures 447 msec. Aortic valve mean gradient measures 5.0 mmHg. Aortic valve peak gradient measures 10.1 mmHg. Aortic valve area, by VTI measures 1.76 cm. Pulmonic Valve: The pulmonic valve was not well visualized. Pulmonic valve regurgitation is not visualized. No evidence of pulmonic stenosis. Aorta: The aortic root is normal in size and structure and aortic dilatation noted. There is mild dilatation of the ascending aorta, measuring 40 mm. Venous: The inferior vena cava is normal in size with greater than 50% respiratory variability, suggesting right atrial pressure of 3 mmHg. IAS/Shunts: No atrial level shunt detected by color flow Doppler.  LEFT VENTRICLE PLAX 2D LVIDd:         4.90 cm      Diastology LVIDs:         3.85 cm      LV e' medial:    5.87 cm/s LV PW:         1.20 cm      LV E/e' medial:  14.6 LV IVS:        1.20 cm      LV e' lateral:   6.96 cm/s LVOT diam:      2.00 cm      LV E/e' lateral: 12.3 LV SV:         74 LV SV Index:   38 LVOT Area:     3.14 cm  LV Volumes (MOD) LV vol d, MOD A2C: 114.0 ml LV vol d, MOD A4C: 107.0 ml LV vol s, MOD A2C: 66.3 ml LV vol s, MOD A4C: 66.1 ml LV SV MOD A2C:     47.7 ml LV SV MOD A4C:     107.0 ml LV SV MOD BP:      47.6 ml RIGHT VENTRICLE RV Basal diam:  3.85 cm RV Mid diam:    4.00 cm RV S prime:     16.20 cm/s TAPSE (M-mode): 2.7 cm LEFT ATRIUM             Index        RIGHT ATRIUM           Index LA diam:        4.40 cm 2.25 cm/m   RA Area:     16.50 cm LA Vol (A2C):   76.3 ml 39.05 ml/m  RA Volume:   49.70 ml  25.44  ml/m LA Vol (A4C):   98.6 ml 50.46 ml/m LA Biplane Vol: 87.4 ml 44.73 ml/m  AORTIC VALVE                     PULMONIC VALVE AV Area (Vmax):    1.50 cm      PV Vmax:       0.60 m/s AV Area (Vmean):   1.54 cm      PV Peak grad:  1.4 mmHg AV Area (VTI):     1.76 cm AV Vmax:           159.00 cm/s AV Vmean:          104.500 cm/s AV VTI:            0.424 m AV Peak Grad:      10.1 mmHg AV Mean Grad:      5.0 mmHg LVOT Vmax:         75.70 cm/s LVOT Vmean:        51.300 cm/s LVOT VTI:          0.237 m LVOT/AV VTI ratio: 0.56 AI PHT:            447 msec  AORTA Ao Root diam: 3.60 cm Ao Asc diam:  4.00 cm MITRAL VALVE MV Area (PHT): 3.85 cm    SHUNTS MV Area VTI:   1.95 cm    Systemic VTI:  0.24 m MV Peak grad:  7.4 mmHg    Systemic Diam: 2.00 cm MV Mean grad:  2.0 mmHg MV Vmax:       1.36 m/s MV Vmean:      58.3 cm/s MV Decel Time: 197 msec MV E velocity: 85.90 cm/s MV A velocity: 66.80 cm/s MV E/A ratio:  1.29 Carlyle Dolly MD Electronically signed by Carlyle Dolly MD Signature Date/Time: 02/16/2022/5:19:05 PM    Final     Cardiac Studies   Echocardiogram 01/20/2022      1. Left ventricular ejection fraction by 3D volume is 51 %. The left  ventricle has low normal function. The left ventricle has no regional wall  motion abnormalities. Left ventricular diastolic parameters are consistent  with Grade II  diastolic  dysfunction (pseudonormalization).   2. Right ventricular systolic function is normal. The right ventricular  size is normal.   3. Left atrial size was moderately dilated.   4. The mitral valve is normal in structure. Mild to moderate mitral valve  regurgitation. No evidence of mitral stenosis.   5. The aortic valve is tricuspid. The right cusp appears to be  restrictive in motion. Aortic valve regurgitation is moderate. Mild aortic  valve stenosis. Aortic regurgitation PHT measures 547 msec.   6. There is mild dilatation of the aortic root, measuring 37 mm. There is  moderate dilatation of the ascending aorta, measuring 43 mm.   7. The inferior vena cava is normal in size with greater than 50%  respiratory variability, suggesting right atrial pressure of 3 mmHg.      Cardiac catheterization 12/07/2017   LESION #1 & 1.5: Mid Cx to Dist Cx lesion is 95% stenosed with 55% stenosed side branch in Ost 2nd Mrg. Scoring balloon angioplasty was performed on the side branch using a BALLOON WOLVERINE 2.50X10. Post intervention, the side branch was reduced to 20% residual stenosis. A drug-eluting stent was successfully placed in the main Circumflex using a STENT SYNERGY DES 3X16. -Postdilated to 3.3 mm Post intervention, there is a 0% residual stenosis. LESION #  2: Prox LAD lesion is 85% stenosed. A drug-eluting stent was successfully placed using a STENT SYNERGY DES 3X16. - Post-dilated to 3.6 mm Post intervention, there is a 0% residual stenosis. _______________________________________________ LV end diastolic pressure is moderately elevated.   Successful DES PCI of mid-dist Cx across 2nd Mrg & AVG Cx -Synergy DES 3.0 mm x 16 mm (3.3 mm) with Cutting Balloon PTCA of ostial 2nd Mrg using a 2.5 mm Wolverine balloon. Successful PCI of Ost LAD to Prox LAD lesion is 85% stenosed.-With a Synergy DES 3.0 mm x 16 mm postdilated to 3.6 mm  Moderately Elevated LVEDP     Transfer to 6  Central post procedure unit for sheath removal with manual pressure. Monitor renal function and hemoglobin/hematocrit Dual antiplatelet therapy for minimum 1 year     Anticipate discharge in about 2 days if renal function stable.   Diagnostic Dominance: Right  Intervention     Patient Profile     88 y.o. male with extensive history of CAD (previous STEMI, stents to proximal LAD and mid LCx 2019, chronic total occlusion of RCA), presents with acute on chronic exacerbation of heart failure with mildly reduced left ventricular systolic function (EF 23% by echo July 2023), on background of hypertension, hypercholesterolemia, paroxysmal atrial fibrillation on chronic anticoagulation, CKD stage IIIb, BPH  Assessment & Plan    Acute on Chronic combined Systolic and Diastolic CHF  Ischemic Cardiomyopathy  - Echo this admission showed EV 45-50%, no regional wall motion abnormalities, grade II diastolic dysfunction, normal RV systolic function  - Patient presented with evidence of hypervolemia-- BNP elevated to 1060, CXR with mild, diffuse bilateral Insterstitial opacity and small bilateral pleural effusions - Held lasix yesterday to protect renal function prior to cath today-- even off lasix, patient put out 1.43 L urine and is net -2 L since admission  - Heart cath yesterday got postponed due to worsening creatinine-- today, creatinine improved from 2.05 to 1.75 (baseline creatinine approx 1.56-1.76)  - Continue metoprolol 12.5 mg BID - Additional GDMT limited by renal function - Note that even when patient is not taking lasix, he has good urine output. Takes PRN lasix at home, but he forgot it when he went to the beach so he got volume overloaded. I suspect we can discharge him on PRN lasix    CAD  Elevated Trop  - Note that patient had EKG changes upon presentation, trop peaked around 250  - Possible that trop elevation and EKG changes are due to demand infarction in the setting of heart  failure. However, also possible that there is a larger area of ischemic myocardium that is causing patient's decompensation - Patient agreeable to [place]-- postponed yesterday due to elevated creatinine. Creatinine down to 1.75 today. If patient does need intervention, likely it will have to be a staged procedure to limit use of contrast  - Patient has a documented contrast allergy-- makes him "feel bad all over."  He and his daughter both report that he has not been premedicated prior to his past 2 heart caths and did not have an adverse reaction to contrast.  To prevent any reaction, we have ordered a one time dose of prednisone prior to cath  - See progress note from 8/20 for documented consent for catheterization  - Continue ASA, plavix, metoprolol 12.5 mg BID, lipitor 40 mg daily   Paroxysmal Atrial Fibrillation  - Patient is maintaining rhythm per telemetry with HR in the 50s  - Eliquis held and patient  is on IV heparin for cath  - Continue metoprolol as above    AKI on CKD stage IIIb - Creatinine ranged from 1.56- 1.76 for the past month. Now 1.75 - Avoid nephrotoxic medications   For questions or updates, please contact [place] Please consult [email or web address] for contact info under        Signed, Margie Billet, PA-C  02/18/2022, 7:42 AM    Patient seen and examined with Vikki Ports PA-C.  Agree as above, with the following exceptions and changes as noted below.  No chest pain, anticipating cath today. Gen: NAD, CV: RRR, no murmurs, Lungs: clear, Abd: soft, Extrem: Warm, well perfused, no edema, Neuro/Psych: alert and oriented x 3, normal mood and affect. All available labs, radiology testing, previous records reviewed.  Seen with his daughter Vinnie Level at the bedside.  Plan for coronary angiography today.  Appears euvolemic now and is at baseline renal function.  We will plan to keep him overnight to monitor both renal function and volume status in the morning, agree that he  may benefit from as needed Lasix home-going.  His daughter feels like this incident was due to not having his Lasix prescription at the beach and perhaps having some dietary indiscretions.  Consent for cath obtained by prior team, see documentation.  Elouise Munroe, MD 02/18/22 8:59 AM

## 2022-02-18 NOTE — Care Management Important Message (Signed)
Important Message  Patient Details  Name: Paul Kramer MRN: [id number] Date of Birth: [birth year]   Medicare Important Message Given:  Yes     Orbie Pyo 02/18/2022, 3:06 PM

## 2022-02-18 NOTE — Progress Notes (Signed)
While patient was attempting to use a urinal the patient's nurse and family noted bleeding from right radial arterial site. Called to 2c16 for bleeding at arterial radial site. No hematoma was noted upon arrival to unit. Moderate amount of ecchymosis was noted around the zephryr band; site soft to touch. Zephyr band was removed and replaced with TR-band. 18cc of air was added to TR band. Patient monitored for an additional 10 min. Right arm was elevated above heart level. Precautions explained to RN, patient, and family. Findings reported to [name], MD.Joie Reamer, Georgie Chard

## 2022-02-19 DIAGNOSIS — I249 Acute ischemic heart disease, unspecified: Secondary | ICD-10-CM | POA: Diagnosis not present

## 2022-02-19 LAB — BASIC METABOLIC PANEL
Anion gap: 6 (ref 5–15)
BUN: 32 mg/dL — ABNORMAL HIGH (ref 8–23)
CO2: 23 mmol/L (ref 22–32)
Calcium: 8.7 mg/dL — ABNORMAL LOW (ref 8.9–10.3)
Chloride: 109 mmol/L (ref 98–111)
Creatinine, Ser: 1.82 mg/dL — ABNORMAL HIGH (ref 0.61–1.24)
GFR, Estimated: 35 mL/min — ABNORMAL LOW (ref >60)
Glucose, Bld: 122 mg/dL — ABNORMAL HIGH (ref 70–99)
Potassium: 4.6 mmol/L (ref 3.5–5.1)
Sodium: 138 mmol/L (ref 135–145)

## 2022-02-19 LAB — CBC
HCT: 30.4 % — ABNORMAL LOW (ref 39.0–52.0)
Hemoglobin: 10.5 g/dL — ABNORMAL LOW (ref 13.0–17.0)
MCH: 35.1 pg — ABNORMAL HIGH (ref 26.0–34.0)
MCHC: 34.5 g/dL (ref 30.0–36.0)
MCV: 101.7 fL — ABNORMAL HIGH (ref 80.0–100.0)
Platelets: 172 10*3/uL (ref 150–400)
RBC: 2.99 MIL/uL — ABNORMAL LOW (ref 4.22–5.81)
RDW: 13.1 % (ref 11.5–15.5)
WBC: 11 10*3/uL — ABNORMAL HIGH (ref 4.0–10.5)
nRBC: 0 % (ref 0.0–0.2)

## 2022-02-19 LAB — HEPARIN LEVEL (UNFRACTIONATED): Heparin Unfractionated: 0.57 IU/mL (ref 0.30–0.70)

## 2022-02-19 LAB — APTT: aPTT: 75 seconds — ABNORMAL HIGH (ref 24–36)

## 2022-02-19 MED ORDER — BISACODYL 5 MG PO TBEC: 5.0000 mg | DELAYED_RELEASE_TABLET | Freq: Every day | ORAL | Status: DC | PRN

## 2022-02-19 MED ORDER — ALBUTEROL SULFATE (2.5 MG/3ML) 0.083% IN NEBU: 2.5000 mg | INHALATION_SOLUTION | RESPIRATORY_TRACT | Status: DC | PRN

## 2022-02-19 NOTE — Progress Notes (Addendum)
Progress Note  Patient Name: Cambell Stanek Date of Encounter: 02/19/2022  [facility] Cardiologist: Glenetta Hew, MD   Subjective   Patient is doing well this AM. Denies chest pain or pain at right radial cath site. Has not had a BM since 8/18.   Inpatient Medications    Scheduled Meds:  albuterol  2.5 mg Nebulization BID   allopurinol  100 mg Oral Daily   aspirin EC  81 mg Oral Daily   atorvastatin  40 mg Oral QHS   clopidogrel  75 mg Oral Daily   cyanocobalamin  5,000 mcg Oral Daily   fluticasone  1 spray Each Nare Daily   melatonin  10 mg Oral QHS   metoprolol tartrate  12.5 mg Oral BID   multivitamin with minerals  1 tablet Oral Daily   psyllium  1 packet Oral Daily   sodium chloride flush  3 mL Intravenous Q12H   sodium chloride flush  3 mL Intravenous Q12H   terazosin  4 mg Oral QPM   Continuous Infusions:  sodium chloride 10 mL/hr at 02/18/22 0327   sodium chloride     heparin 1,100 Units/hr (02/19/22 0800)   PRN Meds: sodium chloride, acetaminophen, nitroGLYCERIN, ondansetron (ZOFRAN) IV, mouth rinse, sodium chloride flush   Vital Signs    Vitals:   02/18/22 2002 02/18/22 2015 02/18/22 2125 02/18/22 2314  BP:  (!) 105/52 (!) 113/53 (!) 112/52  Pulse:  64 65 (!) 55  Resp:  (!) 22  (!) 21  Temp:  (!) 97.2 F (36.2 C)  98 F (36.7 C)  TempSrc:  Axillary  Oral  SpO2: 96% 98%    Weight:      Height:        Intake/Output Summary (Last 24 hours) at 02/19/2022 0803 Last data filed at 02/19/2022 0800 Gross per 24 hour  Intake 408.54 ml  Output 650 ml  Net -241.46 ml      02/18/2022    3:00 AM 02/16/2022    5:00 AM 02/15/2022   10:57 PM  Last 3 Weights  Weight (lbs) 187 lb 6.3 oz 189 lb 6 oz 189 lb 6 oz  Weight (kg) 85 kg 85.9 kg 85.9 kg      Telemetry    Sinus rhythm, HR in the 50s-60s - Personally Reviewed  ECG    No new tracings - Personally Reviewed  Physical Exam   GEN: No acute distress. Sitting comfortably in the bed  Neck: No  JVD Cardiac: RRR, no murmurs, rubs, or gallops. Right radial cath site is soft and nontender. Bruising noted, but patient denies any pain. Bruised area is soft.  Respiratory: Clear to auscultation bilaterally. GI: Soft, mildly tender to palpation in the lower quadrants  MS: No edema; No deformity. Neuro:  Nonfocal  Psych: Normal affect   Labs    High Sensitivity Troponin:   Recent Labs  Lab 02/15/22 1406 02/15/22 1635  TROPONINIHS 163* 277*     Chemistry Recent Labs  Lab 02/15/22 1406 02/16/22 0109 02/17/22 0119 02/18/22 0036  NA 141 142 138 139  K 4.5 3.8 4.0 4.2  CL 110 111 106 106  CO2 '22 22 23 23  '$ GLUCOSE 160* 115* 120* 128*  BUN 34* 30* 31* 29*  CREATININE 1.68* 1.76* 2.05* 1.75*  CALCIUM 8.5* 8.4* 8.3* 8.6*  MG  --  2.0  --   --   PROT 6.4*  --   --   --   ALBUMIN 4.0  --   --   --  AST 17  --   --   --   ALT 14  --   --   --   ALKPHOS 92  --   --   --   BILITOT 0.8  --   --   --   GFRNONAA 39* 37* 30* 37*  ANIONGAP '9 9 9 10    '$ Lipids  Recent Labs  Lab 02/16/22 0109  CHOL 110  TRIG 60  HDL 43  LDLCALC 55  CHOLHDL 2.6    Hematology Recent Labs  Lab 02/17/22 0119 02/18/22 0036 02/19/22 0640  WBC 6.8 6.5 11.0*  RBC 3.05* 3.02* 2.99*  HGB 10.7* 10.6* 10.5*  HCT 31.3* 30.6* 30.4*  MCV 102.6* 101.3* 101.7*  MCH 35.1* 35.1* 35.1*  MCHC 34.2 34.6 34.5  RDW 13.2 13.0 13.1  PLT 173 170 172   Thyroid  Recent Labs  Lab 02/16/22 0109  TSH 5.579*  FREET4 1.10    BNP Recent Labs  Lab 02/15/22 1407 02/18/22 0036  BNP 1,060.9* 623.5*    DDimer No results for input(s): "DDIMER" in the last 168 hours.   Radiology    CARDIAC CATHETERIZATION  Result Date: 02/18/2022 1.  Severe calcific diffuse left main disease with very severe distal left main stenosis, disease extending into the ostium of the left circumflex and LAD 2.  Continued patency of the stented segments in the LAD and left circumflex with no high-grade obstruction beyond the complex  distal left main lesion 3.  Chronic occlusion of the RCA unchanged from the prior study Recommend: Review case with colleagues.  The patient is not a candidate for cardiac surgery (88 years old, DNR).  However, he is functional and it would be reasonable to consider hemodynamically supported PCI that would have to involve atherectomy and bifurcation stenting of the distal left main bifurcation.  We will follow creatinine in the setting of his CKD and discuss options with colleagues as well as the patient and his family.  If he opts for medical therapy, the approach to his care would have to be palliative.    Cardiac Studies   Left Heart Catheterization 02/18/2022 1.  Severe calcific diffuse left main disease with very severe distal left main stenosis, disease extending into the ostium of the left circumflex and LAD 2.  Continued patency of the stented segments in the LAD and left circumflex with no high-grade obstruction beyond the complex distal left main lesion 3.  Chronic occlusion of the RCA unchanged from the prior study   Recommend: Review case with colleagues.  The patient is not a candidate for cardiac surgery (88 years old, DNR).  However, he is functional and it would be reasonable to consider hemodynamically supported PCI that would have to involve atherectomy and bifurcation stenting of the distal left main bifurcation.  We will follow creatinine in the setting of his CKD and discuss options with colleagues as well as the patient and his family.  If he opts for medical therapy, the approach to his care would have to be palliative.  Diagnostic Dominance: Right   Echocardiogram 02/16/2022   1. Left ventricular ejection fraction, by estimation, is 45 to 50%. The  left ventricle has low normal function. The left ventricle has no regional  wall motion abnormalities. There is mild left ventricular hypertrophy.  Left ventricular diastolic  parameters are consistent with Grade II diastolic  dysfunction  (pseudonormalization). Elevated left atrial pressure.   2. Right ventricular systolic function is normal. The right ventricular  size is normal.   3. Left atrial size was moderately dilated.   4. The mitral valve is abnormal. Mild mitral valve regurgitation. No  evidence of mitral stenosis.   5. The aortic valve is tricuspid. There is moderate calcification of the  aortic valve. There is moderate thickening of the aortic valve. Aortic  valve regurgitation is mild to moderate.   6. Aortic dilatation noted. There is mild dilatation of the ascending  aorta, measuring 40 mm.   7. The inferior vena cava is normal in size with greater than 50%  respiratory variability, suggesting right atrial pressure of 3 mmHg.   Patient Profile     88 y.o. male with extensive history of CAD (previous STEMI, stents to proximal LAD and mid LCx 2019, chronic total occlusion of RCA), presents with acute on chronic exacerbation of heart failure with mildly reduced left ventricular systolic function (EF 34% by echo July 2023), on background of hypertension, hypercholesterolemia, paroxysmal atrial fibrillation on chronic anticoagulation, CKD stage IIIb, BPH  Assessment & Plan    Acute on Chronic combined Systolic and Diastolic CHF  Ischemic Cardiomyopathy  - Echo this admission showed EV 45-50%, no regional wall motion abnormalities, grade II diastolic dysfunction, normal RV systolic function  - Patient presented with evidence of hypervolemia-- BNP elevated to 1060, CXR with mild, diffuse bilateral Insterstitial opacity and small bilateral pleural effusions - Patient underwent LHC yesterday that showed severe calcific diffuse left main disease. Patient is considering intervention vs medical management-- As of right now, patient is planning on undergoing intervention. MD to discuss further with family. We have asked an interventionalist to discuss with patient and family as it is a complex case  - Continue  metoprolol 12.5 mg BID - Additional GDMT limited by renal function - We have been holding lasix the past few days to protect renal function prior to cath. Patient put out 0.78 L urine  yesterday off diuretics  - Note that even when patient is not taking lasix, he has good urine output. Takes PRN lasix at home, but he forgot it when he went to the beach so he got volume overloaded. I suspect we can discharge him on PRN lasix    CAD  Elevated Trop  - Note that patient had EKG changes upon presentation, trop peaked around 250  - Underwent LHC yesterday-- showed severe calcific diffuse left main disease with very severe distal left main stenosis, disease extending into the ostium of the left circumflex and lad. Also showed chronic occlusion of the RCA, patency of previously stented segements of the LAD and left circumflex  - Patient is considering intervention vs medical management. Currently planning on undergoing intervention. Films were reviewed with our interventionalist colleagues and they believe that it would be a difficult intervention involving atherectomy and bifurcation stenting of the distal left main bifurcation. Also would require hemodynamic support during the procedure. We have asked Dr. Gwenlyn Found to further discuss case with patient due to its complexity  - Creatinine slightly elevated today after cath-- up to 1.82 from 1.75 yesterday  - Continue ASA, plavix, metoprolol 12.5 mg BID, lipitor 40 mg daily   Paroxysmal Atrial Fibrillation  - Patient is maintaining rhythm per telemetry with HR in the 50s-60s  - Eliquis held and patient is on IV heparin. He should remain on IV heparin until official decision regarding PCI vs medical management of left main disease  - Continue metoprolol as above    AKI on CKD stage IIIb - Creatinine  ranged from 1.56- 1.76 for the past month. 1.82 today  - Avoid nephrotoxic medications     For questions or updates, please contact [place] Please  consult [email or web address] for contact info under        Signed, Margie Billet, PA-C  02/19/2022, 8:03 AM    Patient seen and examined with KJ PA-C.  Agree as above, with the following exceptions and changes as noted below.  Patient is feeling well, discussed with his daughter potential plan for complex left main intervention with Dr. Ellyn Hack who is his primary cardiologist on Friday which is Dr. Allison Quarry next availability.  Patient is 88 years old with renal dysfunction and I stressed that this procedure would be high risk.  They are interested in understanding procedural details of protected left main PCI, given that this is not my area of expertise I will involve my colleague Dr. Quay Burow who will plan to discuss procedural details with the patient and his daughter later today. Gen: NAD, CV: RRR, Lungs: No increased work of breathing, Extrem:no edema, Neuro/Psych: alert and oriented x 3, normal mood and affect. All available labs, radiology testing, previous records reviewed.  He is on IV heparin after cath and we will continue this for critical left main disease until plan for complex PCI of the left main likely Friday with Dr. Ellyn Hack.  Elouise Munroe, MD 02/19/22 9:11 AM

## 2022-02-19 NOTE — Progress Notes (Signed)
[place] for heparin Indication: chest pain/ACS  Allergies  Allergen Reactions   Other Shortness Of Breath    Esters   Robitussin Dm Max Day-Night Anaphylaxis and Swelling    Throat swelling   Banana Other (See Comments)    Severe indigestion   Contrast Media [Iodinated Contrast Media] Other (See Comments)    Just feel bad all over   Morphine And Related Other (See Comments)    Patient states he does not like to take Morphine as it gives him nightmares and hallucinations.     Patient Measurements: Height: '5\' 6"'$  (167.6 cm) Weight: 85 kg (187 lb 6.3 oz) IBW/kg (Calculated) : 63.8 Heparin Dosing Weight: 80kg  Vital Signs: Temp: 97.5 F (36.4 C) (08/23 0817) Temp Source: Oral (08/23 0817) BP: 127/54 (08/23 0817) Pulse Rate: 65 (08/23 0817)  Labs: Recent Labs    02/17/22 0119 02/17/22 0811 02/18/22 0036 02/19/22 0640  HGB 10.7*  --  10.6* 10.5*  HCT 31.3*  --  30.6* 30.4*  PLT 173  --  170 172  APTT 55* 70* 76* 75*  HEPARINUNFRC 0.77*  --  0.68 0.57  CREATININE 2.05*  --  1.75* 1.82*     Estimated Creatinine Clearance: 28.1 mL/min (A) (by C-G formula based on SCr of 1.82 mg/dL (H)).   Assessment: 8 YOM presenting with SOB, hx CAD and elevated troponin, he is on Eliquis PTA for afib. Last dose of apixaban 8/19 AM per med rec.   Cath with severe calcific diffuse left main disease with severe distal left main stenosis. Cardiology considering PCI pending patient discussion. Anticipate continuation of heparin until further goals of care are made.  Heparin level 0.57 units/hr and aPTT 75 which are both therapeutic. CBC stable with no signs of bleeding reported.  Goal of Therapy:  Heparin level 0.3-0.7 units/ml aPTT 66-102 seconds Monitor platelets by anticoagulation protocol: Yes   Plan:  Continue heparin at 1100 units/hr Daily heparin level, aPTT, and CBC  Erskine Speed, PharmD Clinical Pharmacist 02/19/2022 8:21 AM

## 2022-02-19 NOTE — Progress Notes (Signed)
Mobility Specialist - Progress Note   02/19/22 1300  Mobility  Activity Ambulated with assistance in hallway  Level of Assistance Standby assist, set-up cues, supervision of patient - no hands on  Assistive Device Other (Comment) (IV pole)  Distance Ambulated (ft) 475 ft  Activity Response Tolerated well  $Mobility charge 1 Mobility    Pre-mobility: 61 HR, 140/68 BP, 96% SpO2 During mobility: 74 HR,93%SpO2 Post-mobility: 61 HR, 144/72 BP, 95% SpO2  Pt was received in bed and agreeable to mobility. Pt requesting to use the BR and had a successful BM. Pt c/o back and knee pain during ambulation. Pt returned back to bed with all needs met and RN notified.  Larey Seat

## 2022-02-19 NOTE — Progress Notes (Signed)
SATURATION QUALIFICATIONS: (This note is used to comply with regulatory documentation for home oxygen)  Patient Saturations on Room Air at Rest = 96%  Patient Saturations on Room Air while Ambulating = 93%  

## 2022-02-20 ENCOUNTER — Encounter (HOSPITAL_COMMUNITY): Payer: Self-pay | Admitting: Cardiology

## 2022-02-20 DIAGNOSIS — I249 Acute ischemic heart disease, unspecified: Secondary | ICD-10-CM | POA: Diagnosis not present

## 2022-02-20 LAB — BASIC METABOLIC PANEL
Anion gap: 8 (ref 5–15)
BUN: 37 mg/dL — ABNORMAL HIGH (ref 8–23)
CO2: 21 mmol/L — ABNORMAL LOW (ref 22–32)
Calcium: 8.4 mg/dL — ABNORMAL LOW (ref 8.9–10.3)
Chloride: 109 mmol/L (ref 98–111)
Creatinine, Ser: 1.88 mg/dL — ABNORMAL HIGH (ref 0.61–1.24)
GFR, Estimated: 34 mL/min — ABNORMAL LOW (ref >60)
Glucose, Bld: 103 mg/dL — ABNORMAL HIGH (ref 70–99)
Potassium: 4.6 mmol/L (ref 3.5–5.1)
Sodium: 138 mmol/L (ref 135–145)

## 2022-02-20 LAB — CBC
HCT: 28.7 % — ABNORMAL LOW (ref 39.0–52.0)
Hemoglobin: 9.8 g/dL — ABNORMAL LOW (ref 13.0–17.0)
MCH: 34.8 pg — ABNORMAL HIGH (ref 26.0–34.0)
MCHC: 34.1 g/dL (ref 30.0–36.0)
MCV: 101.8 fL — ABNORMAL HIGH (ref 80.0–100.0)
Platelets: 169 10*3/uL (ref 150–400)
RBC: 2.82 MIL/uL — ABNORMAL LOW (ref 4.22–5.81)
RDW: 13 % (ref 11.5–15.5)
WBC: 8.3 10*3/uL (ref 4.0–10.5)
nRBC: 0 % (ref 0.0–0.2)

## 2022-02-20 LAB — APTT: aPTT: 76 seconds — ABNORMAL HIGH (ref 24–36)

## 2022-02-20 LAB — HEPARIN LEVEL (UNFRACTIONATED): Heparin Unfractionated: 0.55 IU/mL (ref 0.30–0.70)

## 2022-02-20 NOTE — TOC Progression Note (Signed)
Transition of Care [facility]) - Progression Note    Patient Details  Name: Paul Kramer MRN: [id number] Date of Birth: [birth year]  Transition of Care [facility]) CM/SW [place], RN Phone Number: 02/20/2022, 3:36 PM  Clinical Narrative:    TOC continues to follow for needs. Planned complex intervention with Dr. Ellyn Hack 02/21/22.         Expected Discharge Plan and Services                                                 Social Determinants of Health (SDOH) Interventions    Readmission Risk Interventions     No data to display

## 2022-02-20 NOTE — H&P (View-Only) (Signed)
Progress Note  Patient Name: Paul Kramer Date of Encounter: 02/20/2022  [facility] Cardiologist: Glenetta Hew, MD   Subjective   No CP or SOB. Sats dropped while sleeping. Still appears euvolemic.  Inpatient Medications    Scheduled Meds:  allopurinol  100 mg Oral Daily   aspirin EC  81 mg Oral Daily   atorvastatin  40 mg Oral QHS   clopidogrel  75 mg Oral Daily   cyanocobalamin  5,000 mcg Oral Daily   fluticasone  1 spray Each Nare Daily   melatonin  10 mg Oral QHS   metoprolol tartrate  12.5 mg Oral BID   multivitamin with minerals  1 tablet Oral Daily   psyllium  1 packet Oral Daily   sodium chloride flush  3 mL Intravenous Q12H   sodium chloride flush  3 mL Intravenous Q12H   terazosin  4 mg Oral QPM   Continuous Infusions:  sodium chloride 10 mL/hr at 02/18/22 0327   sodium chloride     heparin 1,100 Units/hr (02/19/22 1900)   PRN Meds: sodium chloride, acetaminophen, albuterol, bisacodyl, nitroGLYCERIN, ondansetron (ZOFRAN) IV, mouth rinse, sodium chloride flush   Vital Signs    Vitals:   02/19/22 2335 02/20/22 0413 02/20/22 0726 02/20/22 1134  BP: (!) 115/57 (!) 128/59 128/63 132/65  Pulse:  (!) 55 61 (!) 57  Resp:  '15 15 16  '$ Temp: 98.3 F (36.8 C) 98 F (36.7 C) 97.8 F (36.6 C) 97.8 F (36.6 C)  TempSrc: Oral Oral Oral Oral  SpO2:  92% 92% 92%  Weight:  85 kg    Height:        Intake/Output Summary (Last 24 hours) at 02/20/2022 1243 Last data filed at 02/20/2022 1000 Gross per 24 hour  Intake 746.92 ml  Output 1501 ml  Net -754.08 ml      02/20/2022    4:13 AM 02/18/2022    3:00 AM 02/16/2022    5:00 AM  Last 3 Weights  Weight (lbs) 187 lb 6.3 oz 187 lb 6.3 oz 189 lb 6 oz  Weight (kg) 85 kg 85 kg 85.9 kg      Telemetry    Sinus rhythm, HR in the 50s-60s - Personally Reviewed  ECG    No new tracings - Personally Reviewed  Physical Exam   GEN: No acute distress. Sitting comfortably in the bed  Neck: No JVD Cardiac: RRR, no  murmurs, rubs, or gallops.  Respiratory: Clear to auscultation bilaterally. GI: Soft, nontender MS: No edema; No deformity. Neuro:  Nonfocal  Psych: Normal affect   Labs    High Sensitivity Troponin:   Recent Labs  Lab 02/15/22 1406 02/15/22 1635  TROPONINIHS 163* 277*     Chemistry Recent Labs  Lab 02/15/22 1406 02/16/22 0109 02/17/22 0119 02/18/22 0036 02/19/22 0640 02/20/22 0033  NA 141 142   < > 139 138 138  K 4.5 3.8   < > 4.2 4.6 4.6  CL 110 111   < > 106 109 109  CO2 22 22   < > 23 23 21*  GLUCOSE 160* 115*   < > 128* 122* 103*  BUN 34* 30*   < > 29* 32* 37*  CREATININE 1.68* 1.76*   < > 1.75* 1.82* 1.88*  CALCIUM 8.5* 8.4*   < > 8.6* 8.7* 8.4*  MG  --  2.0  --   --   --   --   PROT 6.4*  --   --   --   --   --  ALBUMIN 4.0  --   --   --   --   --   AST 17  --   --   --   --   --   ALT 14  --   --   --   --   --   ALKPHOS 92  --   --   --   --   --   BILITOT 0.8  --   --   --   --   --   GFRNONAA 39* 37*   < > 37* 35* 34*  ANIONGAP 9 9   < > '10 6 8   '$ < > = values in this interval not displayed.    Lipids  Recent Labs  Lab 02/16/22 0109  CHOL 110  TRIG 60  HDL 43  LDLCALC 55  CHOLHDL 2.6    Hematology Recent Labs  Lab 02/18/22 0036 02/19/22 0640 02/20/22 0033  WBC 6.5 11.0* 8.3  RBC 3.02* 2.99* 2.82*  HGB 10.6* 10.5* 9.8*  HCT 30.6* 30.4* 28.7*  MCV 101.3* 101.7* 101.8*  MCH 35.1* 35.1* 34.8*  MCHC 34.6 34.5 34.1  RDW 13.0 13.1 13.0  PLT 170 172 169   Thyroid  Recent Labs  Lab 02/16/22 0109  TSH 5.579*  FREET4 1.10    BNP Recent Labs  Lab 02/15/22 1407 02/18/22 0036  BNP 1,060.9* 623.5*    DDimer No results for input(s): "DDIMER" in the last 168 hours.   Radiology    No results found.  Cardiac Studies   Left Heart Catheterization 02/18/2022 1.  Severe calcific diffuse left main disease with very severe distal left main stenosis, disease extending into the ostium of the left circumflex and LAD 2.  Continued patency of  the stented segments in the LAD and left circumflex with no high-grade obstruction beyond the complex distal left main lesion 3.  Chronic occlusion of the RCA unchanged from the prior study   Recommend: Review case with colleagues.  The patient is not a candidate for cardiac surgery (88 years old, DNR).  However, he is functional and it would be reasonable to consider hemodynamically supported PCI that would have to involve atherectomy and bifurcation stenting of the distal left main bifurcation.  We will follow creatinine in the setting of his CKD and discuss options with colleagues as well as the patient and his family.  If he opts for medical therapy, the approach to his care would have to be palliative.  Diagnostic Dominance: Right   Echocardiogram 02/16/2022   1. Left ventricular ejection fraction, by estimation, is 45 to 50%. The  left ventricle has low normal function. The left ventricle has no regional  wall motion abnormalities. There is mild left ventricular hypertrophy.  Left ventricular diastolic  parameters are consistent with Grade II diastolic dysfunction  (pseudonormalization). Elevated left atrial pressure.   2. Right ventricular systolic function is normal. The right ventricular  size is normal.   3. Left atrial size was moderately dilated.   4. The mitral valve is abnormal. Mild mitral valve regurgitation. No  evidence of mitral stenosis.   5. The aortic valve is tricuspid. There is moderate calcification of the  aortic valve. There is moderate thickening of the aortic valve. Aortic  valve regurgitation is mild to moderate.   6. Aortic dilatation noted. There is mild dilatation of the ascending  aorta, measuring 40 mm.   7. The inferior vena cava is normal in size with greater than 50%  respiratory variability, suggesting right atrial pressure of 3 mmHg.   Patient Profile     88 y.o. male with extensive history of CAD (previous STEMI, stents to proximal LAD and mid  LCx 2019, chronic total occlusion of RCA), presents with acute on chronic exacerbation of heart failure with mildly reduced left ventricular systolic function (EF 00% by echo July 2023), on background of hypertension, hypercholesterolemia, paroxysmal atrial fibrillation on chronic anticoagulation, CKD stage IIIb, BPH  Assessment & Plan    Acute on Chronic combined Systolic and Diastolic CHF  Ischemic Cardiomyopathy  CAD  Elevated Trop  - Echo this admission showed EV 45-50%, no regional wall motion abnormalities, grade II diastolic dysfunction, normal RV systolic function  - Patient presented with evidence of hypervolemia-- BNP elevated to 1060, CXR with mild, diffuse bilateral Insterstitial opacity and small bilateral pleural effusions - Patient underwent LHC that showed severe calcific diffuse left main disease. He would like to pursue complex intervention with Dr. Ellyn Hack 02/21/22. - Continue metoprolol 12.5 mg BID - Additional GDMT limited by renal function - We have been holding lasix the past few days to protect renal function prior to cath. Urine output subjectively adequate per patient.  - Creatinine slightly elevated today after cath-- up to 1.88 - Continue ASA, plavix, metoprolol 12.5 mg BID, lipitor 40 mg daily. Holding lasix.   Paroxysmal Atrial Fibrillation  - Patient is maintaining rhythm per telemetry with HR in the 50s-60s  - Eliquis held and patient is on IV heparin. He should remain on IV heparin until after cath. - Continue metoprolol as above    AKI on CKD stage IIIb - Creatinine ranged from 1.56- 1.76 for the past month. 1.88 today    For questions or updates, please contact [place] Please consult [email or web address] for contact info under        Signed, Elouise Munroe, MD  02/20/2022, 12:43 PM

## 2022-02-20 NOTE — Progress Notes (Signed)
Mobility Specialist Progress Note    02/20/22 1507  Mobility  Activity Ambulated with assistance in hallway  Level of Assistance Contact guard assist, steadying assist  Assistive Device Other (Comment) (IV pole)  Distance Ambulated (ft) 350 ft  Activity Response Tolerated well  $Mobility charge 1 Mobility   Pre-Mobility: 75 HR, 93% SpO2 Post-Mobility: 69 HR, 127/70 BP, 92% SpO2  Pt received in bed and agreeable. C/o some arthritic pain. Returned to sitting EOB with call bell in reach.    Hildred Alamin Mobility Specialist

## 2022-02-20 NOTE — Progress Notes (Signed)
[place] for heparin Indication: chest pain/ACS  Allergies  Allergen Reactions   Other Shortness Of Breath    Esters   Robitussin Dm Max Day-Night Anaphylaxis and Swelling    Throat swelling   Banana Other (See Comments)    Severe indigestion   Contrast Media [Iodinated Contrast Media] Other (See Comments)    Just feel bad all over   Morphine And Related Other (See Comments)    Patient states he does not like to take Morphine as it gives him nightmares and hallucinations.     Patient Measurements: Height: '5\' 6"'$  (167.6 cm) Weight: 85 kg (187 lb 6.3 oz) IBW/kg (Calculated) : 63.8 Heparin Dosing Weight: 80kg  Vital Signs: Temp: 97.8 F (36.6 C) (08/24 0726) Temp Source: Oral (08/24 0726) BP: 128/63 (08/24 0726) Pulse Rate: 61 (08/24 0726)  Labs: Recent Labs    02/18/22 0036 02/19/22 0640 02/20/22 0033 02/20/22 0722  HGB 10.6* 10.5* 9.8*  --   HCT 30.6* 30.4* 28.7*  --   PLT 170 172 169  --   APTT 76* 75*  --  76*  HEPARINUNFRC 0.68 0.57  --  0.55  CREATININE 1.75* 1.82* 1.88*  --      Estimated Creatinine Clearance: 27.2 mL/min (A) (by C-G formula based on SCr of 1.88 mg/dL (H)).   Assessment: 89 YOM presenting with SOB, hx CAD and elevated troponin, he is on Eliquis PTA for afib. Last dose of apixaban 8/19 AM per med rec.   Cath with severe calcific diffuse left main disease with severe distal left main stenosis. Considering high-risk, complex PCI on Friday.  Heparin level 0.55 aPTT 7 which are both therapeutic on heparin at 1100 units/hr. CBC stable with no signs of bleeding reported.  Goal of Therapy:  Heparin level 0.3-0.7 units/ml aPTT 66-102 seconds Monitor platelets by anticoagulation protocol: Yes   Plan:  Continue heparin at 1100 units/hr Daily heparin level, aPTT, and CBC F/u plans for intervention.  [place] Crane, Roylene Reason, BCCP Clinical Pharmacist  02/20/2022 8:20 AM   [facility] pharmacy phone  numbers are listed on [email or web address]

## 2022-02-20 NOTE — Progress Notes (Signed)
Progress Note  Patient Name: Paul Kramer Date of Encounter: 02/20/2022  [facility] Cardiologist: Glenetta Hew, MD   Subjective   No CP or SOB. Sats dropped while sleeping. Still appears euvolemic.  Inpatient Medications    Scheduled Meds:  allopurinol  100 mg Oral Daily   aspirin EC  81 mg Oral Daily   atorvastatin  40 mg Oral QHS   clopidogrel  75 mg Oral Daily   cyanocobalamin  5,000 mcg Oral Daily   fluticasone  1 spray Each Nare Daily   melatonin  10 mg Oral QHS   metoprolol tartrate  12.5 mg Oral BID   multivitamin with minerals  1 tablet Oral Daily   psyllium  1 packet Oral Daily   sodium chloride flush  3 mL Intravenous Q12H   sodium chloride flush  3 mL Intravenous Q12H   terazosin  4 mg Oral QPM   Continuous Infusions:  sodium chloride 10 mL/hr at 02/18/22 0327   sodium chloride     heparin 1,100 Units/hr (02/19/22 1900)   PRN Meds: sodium chloride, acetaminophen, albuterol, bisacodyl, nitroGLYCERIN, ondansetron (ZOFRAN) IV, mouth rinse, sodium chloride flush   Vital Signs    Vitals:   02/19/22 2335 02/20/22 0413 02/20/22 0726 02/20/22 1134  BP: (!) 115/57 (!) 128/59 128/63 132/65  Pulse:  (!) 55 61 (!) 57  Resp:  '15 15 16  '$ Temp: 98.3 F (36.8 C) 98 F (36.7 C) 97.8 F (36.6 C) 97.8 F (36.6 C)  TempSrc: Oral Oral Oral Oral  SpO2:  92% 92% 92%  Weight:  85 kg    Height:        Intake/Output Summary (Last 24 hours) at 02/20/2022 1243 Last data filed at 02/20/2022 1000 Gross per 24 hour  Intake 746.92 ml  Output 1501 ml  Net -754.08 ml      02/20/2022    4:13 AM 02/18/2022    3:00 AM 02/16/2022    5:00 AM  Last 3 Weights  Weight (lbs) 187 lb 6.3 oz 187 lb 6.3 oz 189 lb 6 oz  Weight (kg) 85 kg 85 kg 85.9 kg      Telemetry    Sinus rhythm, HR in the 50s-60s - Personally Reviewed  ECG    No new tracings - Personally Reviewed  Physical Exam   GEN: No acute distress. Sitting comfortably in the bed  Neck: No JVD Cardiac: RRR, no  murmurs, rubs, or gallops.  Respiratory: Clear to auscultation bilaterally. GI: Soft, nontender MS: No edema; No deformity. Neuro:  Nonfocal  Psych: Normal affect   Labs    High Sensitivity Troponin:   Recent Labs  Lab 02/15/22 1406 02/15/22 1635  TROPONINIHS 163* 277*     Chemistry Recent Labs  Lab 02/15/22 1406 02/16/22 0109 02/17/22 0119 02/18/22 0036 02/19/22 0640 02/20/22 0033  NA 141 142   < > 139 138 138  K 4.5 3.8   < > 4.2 4.6 4.6  CL 110 111   < > 106 109 109  CO2 22 22   < > 23 23 21*  GLUCOSE 160* 115*   < > 128* 122* 103*  BUN 34* 30*   < > 29* 32* 37*  CREATININE 1.68* 1.76*   < > 1.75* 1.82* 1.88*  CALCIUM 8.5* 8.4*   < > 8.6* 8.7* 8.4*  MG  --  2.0  --   --   --   --   PROT 6.4*  --   --   --   --   --  ALBUMIN 4.0  --   --   --   --   --   AST 17  --   --   --   --   --   ALT 14  --   --   --   --   --   ALKPHOS 92  --   --   --   --   --   BILITOT 0.8  --   --   --   --   --   GFRNONAA 39* 37*   < > 37* 35* 34*  ANIONGAP 9 9   < > '10 6 8   '$ < > = values in this interval not displayed.    Lipids  Recent Labs  Lab 02/16/22 0109  CHOL 110  TRIG 60  HDL 43  LDLCALC 55  CHOLHDL 2.6    Hematology Recent Labs  Lab 02/18/22 0036 02/19/22 0640 02/20/22 0033  WBC 6.5 11.0* 8.3  RBC 3.02* 2.99* 2.82*  HGB 10.6* 10.5* 9.8*  HCT 30.6* 30.4* 28.7*  MCV 101.3* 101.7* 101.8*  MCH 35.1* 35.1* 34.8*  MCHC 34.6 34.5 34.1  RDW 13.0 13.1 13.0  PLT 170 172 169   Thyroid  Recent Labs  Lab 02/16/22 0109  TSH 5.579*  FREET4 1.10    BNP Recent Labs  Lab 02/15/22 1407 02/18/22 0036  BNP 1,060.9* 623.5*    DDimer No results for input(s): "DDIMER" in the last 168 hours.   Radiology    No results found.  Cardiac Studies   Left Heart Catheterization 02/18/2022 1.  Severe calcific diffuse left main disease with very severe distal left main stenosis, disease extending into the ostium of the left circumflex and LAD 2.  Continued patency of  the stented segments in the LAD and left circumflex with no high-grade obstruction beyond the complex distal left main lesion 3.  Chronic occlusion of the RCA unchanged from the prior study   Recommend: Review case with colleagues.  The patient is not a candidate for cardiac surgery (88 years old, DNR).  However, he is functional and it would be reasonable to consider hemodynamically supported PCI that would have to involve atherectomy and bifurcation stenting of the distal left main bifurcation.  We will follow creatinine in the setting of his CKD and discuss options with colleagues as well as the patient and his family.  If he opts for medical therapy, the approach to his care would have to be palliative.  Diagnostic Dominance: Right   Echocardiogram 02/16/2022   1. Left ventricular ejection fraction, by estimation, is 45 to 50%. The  left ventricle has low normal function. The left ventricle has no regional  wall motion abnormalities. There is mild left ventricular hypertrophy.  Left ventricular diastolic  parameters are consistent with Grade II diastolic dysfunction  (pseudonormalization). Elevated left atrial pressure.   2. Right ventricular systolic function is normal. The right ventricular  size is normal.   3. Left atrial size was moderately dilated.   4. The mitral valve is abnormal. Mild mitral valve regurgitation. No  evidence of mitral stenosis.   5. The aortic valve is tricuspid. There is moderate calcification of the  aortic valve. There is moderate thickening of the aortic valve. Aortic  valve regurgitation is mild to moderate.   6. Aortic dilatation noted. There is mild dilatation of the ascending  aorta, measuring 40 mm.   7. The inferior vena cava is normal in size with greater than 50%  respiratory variability, suggesting right atrial pressure of 3 mmHg.   Patient Profile     88 y.o. male with extensive history of CAD (previous STEMI, stents to proximal LAD and mid  LCx 2019, chronic total occlusion of RCA), presents with acute on chronic exacerbation of heart failure with mildly reduced left ventricular systolic function (EF 35% by echo July 2023), on background of hypertension, hypercholesterolemia, paroxysmal atrial fibrillation on chronic anticoagulation, CKD stage IIIb, BPH  Assessment & Plan    Acute on Chronic combined Systolic and Diastolic CHF  Ischemic Cardiomyopathy  CAD  Elevated Trop  - Echo this admission showed EV 45-50%, no regional wall motion abnormalities, grade II diastolic dysfunction, normal RV systolic function  - Patient presented with evidence of hypervolemia-- BNP elevated to 1060, CXR with mild, diffuse bilateral Insterstitial opacity and small bilateral pleural effusions - Patient underwent LHC that showed severe calcific diffuse left main disease. He would like to pursue complex intervention with Dr. Ellyn Hack 02/21/22. - Continue metoprolol 12.5 mg BID - Additional GDMT limited by renal function - We have been holding lasix the past few days to protect renal function prior to cath. Urine output subjectively adequate per patient.  - Creatinine slightly elevated today after cath-- up to 1.88 - Continue ASA, plavix, metoprolol 12.5 mg BID, lipitor 40 mg daily. Holding lasix.   Paroxysmal Atrial Fibrillation  - Patient is maintaining rhythm per telemetry with HR in the 50s-60s  - Eliquis held and patient is on IV heparin. He should remain on IV heparin until after cath. - Continue metoprolol as above    AKI on CKD stage IIIb - Creatinine ranged from 1.56- 1.76 for the past month. 1.88 today    For questions or updates, please contact [place] Please consult [email or web address] for contact info under        Signed, Elouise Munroe, MD  02/20/2022, 12:43 PM

## 2022-02-21 ENCOUNTER — Other Ambulatory Visit: Payer: Self-pay

## 2022-02-21 ENCOUNTER — Encounter (HOSPITAL_COMMUNITY)
Admission: EM | Disposition: A | Discharge status: Home / Self Care | Payer: Self-pay | Source: Home / Self Care | Attending: Internal Medicine

## 2022-02-21 DIAGNOSIS — I2510 Atherosclerotic heart disease of native coronary artery without angina pectoris: Secondary | ICD-10-CM | POA: Diagnosis not present

## 2022-02-21 DIAGNOSIS — I509 Heart failure, unspecified: Secondary | ICD-10-CM

## 2022-02-21 HISTORY — PX: ARTERIAL LINE INSERTION: CATH118227

## 2022-02-21 HISTORY — PX: CORONARY ATHERECTOMY: CATH118238

## 2022-02-21 HISTORY — PX: CENTRAL LINE INSERTION: CATH118232

## 2022-02-21 HISTORY — PX: CORONARY LITHOTRIPSY: CATH118330

## 2022-02-21 HISTORY — PX: CORONARY STENT INTERVENTION: CATH118234

## 2022-02-21 LAB — BASIC METABOLIC PANEL
Anion gap: 8 (ref 5–15)
BUN: 34 mg/dL — ABNORMAL HIGH (ref 8–23)
CO2: 23 mmol/L (ref 22–32)
Calcium: 8.6 mg/dL — ABNORMAL LOW (ref 8.9–10.3)
Chloride: 108 mmol/L (ref 98–111)
Creatinine, Ser: 1.72 mg/dL — ABNORMAL HIGH (ref 0.61–1.24)
GFR, Estimated: 38 mL/min — ABNORMAL LOW (ref >60)
Glucose, Bld: 111 mg/dL — ABNORMAL HIGH (ref 70–99)
Potassium: 4.6 mmol/L (ref 3.5–5.1)
Sodium: 139 mmol/L (ref 135–145)

## 2022-02-21 LAB — POCT ACTIVATED CLOTTING TIME
Activated Clotting Time: 251 seconds
Activated Clotting Time: 329 seconds
Activated Clotting Time: 342 seconds
Activated Clotting Time: 251 seconds
Activated Clotting Time: 317 seconds
Activated Clotting Time: 269 seconds
Activated Clotting Time: 179 seconds

## 2022-02-21 LAB — CBC
HCT: 35 % — ABNORMAL LOW (ref 39.0–52.0)
Hemoglobin: 11.9 g/dL — ABNORMAL LOW (ref 13.0–17.0)
MCH: 35.2 pg — ABNORMAL HIGH (ref 26.0–34.0)
MCHC: 34 g/dL (ref 30.0–36.0)
MCV: 103.6 fL — ABNORMAL HIGH (ref 80.0–100.0)
Platelets: 185 10*3/uL (ref 150–400)
RBC: 3.38 MIL/uL — ABNORMAL LOW (ref 4.22–5.81)
RDW: 13.1 % (ref 11.5–15.5)
WBC: 8.5 10*3/uL (ref 4.0–10.5)
nRBC: 0 % (ref 0.0–0.2)

## 2022-02-21 LAB — APTT: aPTT: 63 seconds — ABNORMAL HIGH (ref 24–36)

## 2022-02-21 LAB — HEPARIN LEVEL (UNFRACTIONATED): Heparin Unfractionated: 0.27 IU/mL — ABNORMAL LOW (ref 0.30–0.70)

## 2022-02-21 SURGERY — CORONARY STENT INTERVENTION
Anesthesia: LOCAL

## 2022-02-21 MED ORDER — HYDRALAZINE HCL 20 MG/ML IJ SOLN: 10.0000 mg | INTRAMUSCULAR | Status: AC | PRN

## 2022-02-21 MED ORDER — HEPARIN SODIUM (PORCINE) 5000 UNIT/ML IJ SOLN
5000.0000 [IU] | Freq: Three times a day (TID) | INTRAMUSCULAR | Status: DC
  Administered 2022-02-21 – 2022-02-22 (×2): 5000 [IU] via SUBCUTANEOUS
  Filled 2022-02-21 (×2): qty 1

## 2022-02-21 MED ORDER — HEPARIN SODIUM (PORCINE) 1000 UNIT/ML IJ SOLN
INTRAMUSCULAR | Status: DC | PRN
  Administered 2022-02-21: 2000 [IU] via INTRAVENOUS
  Administered 2022-02-21: 8000 [IU] via INTRAVENOUS
  Administered 2022-02-21: 2000 [IU] via INTRAVENOUS
  Administered 2022-02-21: 3000 [IU] via INTRAVENOUS
  Administered 2022-02-21: 2000 [IU] via INTRAVENOUS

## 2022-02-21 MED ORDER — HEPARIN (PORCINE) IN NACL 1000-0.9 UT/500ML-% IV SOLN
INTRAVENOUS | Status: DC | PRN
  Administered 2022-02-21 (×3): 500 mL

## 2022-02-21 MED ORDER — SODIUM CHLORIDE 0.9 % IV SOLN: INTRAVENOUS | Status: AC

## 2022-02-21 MED ORDER — DIPHENHYDRAMINE HCL 50 MG/ML IJ SOLN
25.0000 mg | Freq: Once | INTRAMUSCULAR | Status: AC
  Administered 2022-02-21: 25 mg via INTRAVENOUS
  Filled 2022-02-21: qty 1

## 2022-02-21 MED ORDER — LABETALOL HCL 5 MG/ML IV SOLN: 10.0000 mg | INTRAVENOUS | Status: AC | PRN

## 2022-02-21 MED ORDER — SODIUM CHLORIDE 0.9 % WEIGHT BASED INFUSION
1.0000 mL/kg/h | INTRAVENOUS | Status: DC
  Administered 2022-02-21: 1 mL/kg/h via INTRAVENOUS

## 2022-02-21 MED ORDER — SODIUM CHLORIDE 0.9% FLUSH: 3.0000 mL | Freq: Two times a day (BID) | INTRAVENOUS | Status: DC

## 2022-02-21 MED ORDER — NOREPINEPHRINE 4 MG/250ML-% IV SOLN
INTRAVENOUS | Status: AC
  Filled 2022-02-21: qty 250

## 2022-02-21 MED ORDER — SODIUM CHLORIDE 0.9% FLUSH
3.0000 mL | Freq: Two times a day (BID) | INTRAVENOUS | Status: DC
Start: 2022-02-21 — End: 2022-02-23
  Administered 2022-02-21 – 2022-02-23 (×3): 3 mL via INTRAVENOUS

## 2022-02-21 MED ORDER — SODIUM CHLORIDE 0.9 % IV SOLN: 250.0000 mL | INTRAVENOUS | Status: DC | PRN

## 2022-02-21 MED ORDER — LIDOCAINE HCL (PF) 1 % IJ SOLN
INTRAMUSCULAR | Status: AC
  Filled 2022-02-21: qty 30

## 2022-02-21 MED ORDER — NITROGLYCERIN 1 MG/10 ML FOR IR/CATH LAB
INTRA_ARTERIAL | Status: DC | PRN
  Administered 2022-02-21: 200 ug via INTRACORONARY

## 2022-02-21 MED ORDER — ASPIRIN 81 MG PO CHEW
81.0000 mg | CHEWABLE_TABLET | ORAL | Status: AC
  Administered 2022-02-21: 81 mg via ORAL
  Filled 2022-02-21: qty 1

## 2022-02-21 MED ORDER — SODIUM CHLORIDE 0.9 % WEIGHT BASED INFUSION
3.0000 mL/kg/h | INTRAVENOUS | Status: DC
  Administered 2022-02-21: 3 mL/kg/h via INTRAVENOUS

## 2022-02-21 MED ORDER — LIDOCAINE HCL (PF) 1 % IJ SOLN
INTRAMUSCULAR | Status: DC | PRN
  Administered 2022-02-21: 10 mL
  Administered 2022-02-21: 2 mL

## 2022-02-21 MED ORDER — NITROGLYCERIN 1 MG/10 ML FOR IR/CATH LAB
INTRA_ARTERIAL | Status: AC
  Filled 2022-02-21: qty 10

## 2022-02-21 MED ORDER — HYDRALAZINE HCL 20 MG/ML IJ SOLN
INTRAMUSCULAR | Status: DC | PRN
  Administered 2022-02-21: 10 mg via INTRAVENOUS

## 2022-02-21 MED ORDER — METHYLPREDNISOLONE SODIUM SUCC 125 MG IJ SOLR
125.0000 mg | Freq: Once | INTRAMUSCULAR | Status: AC
  Administered 2022-02-21: 125 mg via INTRAVENOUS
  Filled 2022-02-21: qty 2

## 2022-02-21 MED ORDER — SODIUM CHLORIDE 0.9% FLUSH: 3.0000 mL | INTRAVENOUS | Status: DC | PRN

## 2022-02-21 MED ORDER — IOHEXOL 350 MG/ML SOLN
INTRAVENOUS | Status: DC | PRN
  Administered 2022-02-21: 170 mL

## 2022-02-21 MED ORDER — VERAPAMIL HCL 2.5 MG/ML IV SOLN
INTRAVENOUS | Status: AC
  Filled 2022-02-21: qty 2

## 2022-02-21 MED ORDER — FUROSEMIDE 10 MG/ML IJ SOLN
20.0000 mg | Freq: Once | INTRAMUSCULAR | Status: AC
  Administered 2022-02-21: 20 mg via INTRAVENOUS
  Filled 2022-02-21: qty 2

## 2022-02-21 MED ORDER — NOREPINEPHRINE 4 MG/250ML-% IV SOLN
INTRAVENOUS | Status: DC | PRN
  Administered 2022-02-21: 5 ug/min via INTRAVENOUS

## 2022-02-21 MED ORDER — HYDRALAZINE HCL 20 MG/ML IJ SOLN
INTRAMUSCULAR | Status: AC
  Filled 2022-02-21: qty 1

## 2022-02-21 MED ORDER — SODIUM CHLORIDE 0.9 % IV SOLN
INTRAVENOUS | Status: AC | PRN
  Administered 2022-02-21: 10 mL/h via INTRAVENOUS

## 2022-02-21 MED ORDER — HEPARIN SODIUM (PORCINE) 1000 UNIT/ML IJ SOLN
INTRAMUSCULAR | Status: AC
  Filled 2022-02-21: qty 10

## 2022-02-21 MED ORDER — VERAPAMIL HCL 2.5 MG/ML IV SOLN
INTRAVENOUS | Status: DC | PRN
  Administered 2022-02-21: 10 mL via INTRA_ARTERIAL

## 2022-02-21 SURGICAL SUPPLY — 45 items
BALL SAPPHIRE NC24 3.25X12 (BALLOONS) ×4
BALL SAPPHIRE NC24 3.5X18 (BALLOONS) ×2
BALL SAPPHIRE NC24 4.0X8 (BALLOONS) ×2
BALLN SAPPHIRE 2.0X15 (BALLOONS) ×2
BALLN SCOREFLEX 3.0X15 (BALLOONS) ×2
BALLOON SAPPHIRE 2.0X15 (BALLOONS) IMPLANT
BALLOON SAPPHIRE NC24 3.25X12 (BALLOONS) IMPLANT
BALLOON SAPPHIRE NC24 3.5X18 (BALLOONS) IMPLANT
BALLOON SAPPHIRE NC24 4.0X8 (BALLOONS) IMPLANT
BALLOON SCOREFLEX 3.0X15 (BALLOONS) IMPLANT
CABLE ADAPT PACING TEMP 12FT (ADAPTER) IMPLANT
CATH INFINITI JR4 5F (CATHETERS) IMPLANT
CATH OPTICROSS HD (CATHETERS) IMPLANT
CATH S G BIP PACING (CATHETERS) IMPLANT
CATH SHOCKWAVE 3.5X12 (CATHETERS) IMPLANT
CATH TELEPORT (CATHETERS) IMPLANT
CATH VISTA GUIDE 7FR XB 3.5 (CATHETERS) IMPLANT
CATHETER SHOCKWAVE 3.5X12 (CATHETERS) ×2
COVER SWIFTLINK CONNECTOR (BAG) IMPLANT
CROWN DIAMONDBACK CLASSIC 1.25 (BURR) IMPLANT
DEVICE RAD COMP TR BAND LRG (VASCULAR PRODUCTS) IMPLANT
GLIDESHEATH SLENDER 7FR .021G (SHEATH) IMPLANT
GUIDEWIRE INQWIRE 1.5J.035X260 (WIRE) IMPLANT
INQWIRE 1.5J .035X260CM (WIRE) ×2
KIT ENCORE 26 ADVANTAGE (KITS) IMPLANT
KIT HEART LEFT (KITS) ×2 IMPLANT
KIT MICROPUNCTURE NIT STIFF (SHEATH) IMPLANT
LUBRICANT VIPERSLIDE CORONARY (MISCELLANEOUS) IMPLANT
PACK CARDIAC CATHETERIZATION (CUSTOM PROCEDURE TRAY) ×2 IMPLANT
PINNACLE LONG 6F 25CM (SHEATH) ×2
SHEATH INTRO PINNACLE 6F 25CM (SHEATH) IMPLANT
SHEATH PINNACLE 5F 10CM (SHEATH) IMPLANT
SHEATH PINNACLE 6F 10CM (SHEATH) IMPLANT
SHEATH PROBE COVER 6X72 (BAG) IMPLANT
SLED PULL BACK IVUS (MISCELLANEOUS) IMPLANT
STENT SYNERGY XD 3.0X24 (Permanent Stent) IMPLANT
STENT SYNERGY XD 3.0X38 (Permanent Stent) IMPLANT
SYNERGY XD 3.0X24 (Permanent Stent) ×2 IMPLANT
SYNERGY XD 3.0X38 (Permanent Stent) ×2 IMPLANT
TRANSDUCER W/STOPCOCK (MISCELLANEOUS) ×2 IMPLANT
TUBING CIL FLEX 10 FLL-RA (TUBING) ×2 IMPLANT
WIRE ASAHI PROWATER 300CM (WIRE) IMPLANT
WIRE EMERALD 3MM-J .035X150CM (WIRE) IMPLANT
WIRE RUNTHROUGH .014X180CM (WIRE) IMPLANT
WIRE VIPERWIRE COR FLEX .012 (WIRE) IMPLANT

## 2022-02-21 NOTE — Interval H&P Note (Signed)
History and Physical Interval Note:  02/21/2022 8:21 AM  Paul Kramer  has presented today for surgery, with the diagnosis of cad.  The various methods of treatment have been discussed with the patient and family. After consideration of risks, benefits and other options for treatment, the patient has consented to  Procedure(s): CORONARY STENT INTERVENTION (N/A) as a surgical intervention.  The patient's history has been reviewed, patient examined, no change in status, stable for surgery.  I have reviewed the patient's chart and labs.  Questions were answered to the patient's satisfaction.     Glenetta Hew

## 2022-02-21 NOTE — Progress Notes (Signed)
Groin sheaths removed: Arterial sheath removed at 14:50 hours direct manual pressure held for 25 minutes with hemostasis achieved at 15:15, Level 0. Venous sheath removed at 15:05 hours direct pressure held for 10 minutes, with hemostasis achieved at 15:15 hours.  Both venous and arterial sites cover with gauze and transparent dressing. Distal pedal pulse found with doppler

## 2022-02-21 NOTE — Interval H&P Note (Signed)
History and Physical Interval Note:  02/21/2022 8:21 AM  Paul Kramer  has presented today for surgery, with the diagnosis of cad.  The various methods of treatment have been discussed with the patient and family. After consideration of risks, benefits and other options for treatment, the patient has consented to  Procedure(s): CORONARY STENT INTERVENTION (N/A)  TEMPORARY WIRE PLACEMENT POSSIBLE IABP PLACEMENT  as a surgical intervention.  The patient's history has been reviewed, patient examined, no change in status, stable for surgery.  I have reviewed the patient's chart and labs.  Questions were answered to the patient's satisfaction.    Cath Lab Visit (complete for each Cath Lab visit)  Clinical Evaluation Leading to the Procedure:   ACS: Yes.    Non-ACS:    Anginal Classification: CCS IV  Anti-ischemic medical therapy: Maximal Therapy (2 or more classes of medications)  Non-Invasive Test Results: No non-invasive testing performed  Prior CABG: No previous CABG    Glenetta Hew

## 2022-02-21 NOTE — Progress Notes (Signed)
[place] for heparin Indication: chest pain/ACS  Allergies  Allergen Reactions   Other Shortness Of Breath    Esters   Robitussin Dm Max Day-Night Anaphylaxis and Swelling    Throat swelling   Banana Other (See Comments)    Severe indigestion   Contrast Media [Iodinated Contrast Media] Other (See Comments)    Just feel bad all over   Morphine And Related Other (See Comments)    Patient states he does not like to take Morphine as it gives him nightmares and hallucinations.     Patient Measurements: Height: '5\' 6"'$  (167.6 cm) Weight: 83.9 kg (184 lb 15.5 oz) IBW/kg (Calculated) : 63.8 Heparin Dosing Weight: 80kg  Vital Signs: Temp: 97.9 F (36.6 C) (08/25 0355) Temp Source: Oral (08/25 0355) BP: 151/75 (08/24 2206) Pulse Rate: 65 (08/24 2206)  Labs: Recent Labs    02/19/22 0640 02/20/22 0033 02/20/22 0722 02/21/22 0007 02/21/22 0605  HGB 10.5* 9.8*  --   --  11.9*  HCT 30.4* 28.7*  --   --  35.0*  PLT 172 169  --   --  185  APTT 75*  --  76* 63*  --   HEPARINUNFRC 0.57  --  0.55 0.27*  --   CREATININE 1.82* 1.88*  --  1.72*  --      Estimated Creatinine Clearance: 29.6 mL/min (A) (by C-G formula based on SCr of 1.72 mg/dL (H)).   Assessment: 75 YOM presenting with SOB, hx CAD and elevated troponin, he is on Eliquis PTA for afib. Last dose of apixaban 8/19 AM per med rec.   Cath with severe calcific diffuse left main disease with severe distal left main stenosis. Considering high-risk, complex PCI on Friday. Heparin level and aPTT both slightly subtherapeutic - can stop checking aPTTs. CBC stable.   Goal of Therapy:  Heparin level 0.3-0.7 units/ml aPTT 66-102 seconds Monitor platelets by anticoagulation protocol: Yes   Plan:  Increase heparin to 1200 units/h F/U [facility] plans post-cath  Arrie Senate, PharmD, BCPS, [facility] Clinical Pharmacist [phone] Please check AMION for all [place] numbers 02/21/2022

## 2022-02-21 NOTE — Progress Notes (Signed)
Pt went for complex LM intervention with Dr. Ellyn Hack this morning.  Cath films reviewed, will discuss findings with Dr. Ellyn Hack post procedure.

## 2022-02-21 NOTE — Progress Notes (Signed)
Male purewick placed and connected to suction, tolerated well, safety maintained

## 2022-02-22 DIAGNOSIS — I2510 Atherosclerotic heart disease of native coronary artery without angina pectoris: Secondary | ICD-10-CM | POA: Diagnosis not present

## 2022-02-22 DIAGNOSIS — N179 Acute kidney failure, unspecified: Secondary | ICD-10-CM

## 2022-02-22 DIAGNOSIS — Z955 Presence of coronary angioplasty implant and graft: Secondary | ICD-10-CM | POA: Diagnosis not present

## 2022-02-22 DIAGNOSIS — I249 Acute ischemic heart disease, unspecified: Secondary | ICD-10-CM | POA: Diagnosis not present

## 2022-02-22 DIAGNOSIS — I214 Non-ST elevation (NSTEMI) myocardial infarction: Secondary | ICD-10-CM | POA: Diagnosis not present

## 2022-02-22 LAB — CBC
HCT: 31.1 % — ABNORMAL LOW (ref 39.0–52.0)
Hemoglobin: 10.6 g/dL — ABNORMAL LOW (ref 13.0–17.0)
MCH: 34.8 pg — ABNORMAL HIGH (ref 26.0–34.0)
MCHC: 34.1 g/dL (ref 30.0–36.0)
MCV: 102 fL — ABNORMAL HIGH (ref 80.0–100.0)
Platelets: 174 10*3/uL (ref 150–400)
RBC: 3.05 MIL/uL — ABNORMAL LOW (ref 4.22–5.81)
RDW: 13.2 % (ref 11.5–15.5)
WBC: 9.3 10*3/uL (ref 4.0–10.5)
nRBC: 0 % (ref 0.0–0.2)

## 2022-02-22 LAB — BASIC METABOLIC PANEL WITH GFR
Anion gap: 9 (ref 5–15)
BUN: 33 mg/dL — ABNORMAL HIGH (ref 8–23)
CO2: 20 mmol/L — ABNORMAL LOW (ref 22–32)
Calcium: 8.5 mg/dL — ABNORMAL LOW (ref 8.9–10.3)
Chloride: 109 mmol/L (ref 98–111)
Creatinine, Ser: 2.01 mg/dL — ABNORMAL HIGH (ref 0.61–1.24)
GFR, Estimated: 31 mL/min — ABNORMAL LOW
Glucose, Bld: 215 mg/dL — ABNORMAL HIGH (ref 70–99)
Potassium: 4.6 mmol/L (ref 3.5–5.1)
Sodium: 138 mmol/L (ref 135–145)

## 2022-02-22 MED ORDER — HEPARIN SODIUM (PORCINE) 5000 UNIT/ML IJ SOLN
5000.0000 [IU] | Freq: Three times a day (TID) | INTRAMUSCULAR | Status: DC
  Administered 2022-02-22 – 2022-02-23 (×3): 5000 [IU] via SUBCUTANEOUS
  Filled 2022-02-22 (×3): qty 1

## 2022-02-22 MED ORDER — APIXABAN 2.5 MG PO TABS: 2.5000 mg | ORAL_TABLET | Freq: Two times a day (BID) | ORAL | Status: DC

## 2022-02-22 NOTE — Progress Notes (Addendum)
CARDIAC REHAB PHASE I   PRE:  Rate/Rhythm: 67 SR  BP:  Supine: 120/65  Sitting:   Standing:    SaO2: 93 2L  MODE:  Ambulation: 300 ft   POST:  Rate/Rhythm: 76 SR BP:  Supine: 136/59  Sitting:   Standing:    SaO2: 94 2L 1400-1500 Assisted X 2 used walker,oxygen 2 liters and gait belt to ambulate. Pt lean forward walking with stooping posture.Pt is dyspneic on exertion. He walked 300 feet without c/o. VS stable. Pt back to bed after walk with call light in reach. Completed MI and stent education with pt. We discussed MI, stent , ASA,Plavix and Eliquis, heart healthy diet, exercise guidelines, proper use of sl NTG when to call MD and 911 and Outpt. CRP. He voices uderstanding. Will send referral to Outpt. CRP in [place]. Pt will need to ambulate on room air before discharge and monitor oxygen saturations.  Rodney Langton RN 02/22/2022 3:00 PM

## 2022-02-22 NOTE — Progress Notes (Signed)
Progress Note  Patient Name: Paul Kramer Date of Encounter: 02/22/2022  [facility] Cardiologist: Glenetta Hew, MD   Subjective   Underwent complex bifurcation PCI yesterday of the distal LM/ostial LAD/LCX with excellent result. No chest pain overnight.   Inpatient Medications    Scheduled Meds:  allopurinol  100 mg Oral Daily   aspirin EC  81 mg Oral Daily   atorvastatin  40 mg Oral QHS   clopidogrel  75 mg Oral Daily   cyanocobalamin  5,000 mcg Oral Daily   fluticasone  1 spray Each Nare Daily   heparin  5,000 Units Subcutaneous Q8H   melatonin  10 mg Oral QHS   metoprolol tartrate  12.5 mg Oral BID   multivitamin with minerals  1 tablet Oral Daily   psyllium  1 packet Oral Daily   sodium chloride flush  3 mL Intravenous Q12H   sodium chloride flush  3 mL Intravenous Q12H   sodium chloride flush  3 mL Intravenous Q12H   terazosin  4 mg Oral QPM   Continuous Infusions:  sodium chloride     sodium chloride     PRN Meds: sodium chloride, sodium chloride, acetaminophen, albuterol, bisacodyl, nitroGLYCERIN, ondansetron (ZOFRAN) IV, mouth rinse, sodium chloride flush, sodium chloride flush   Vital Signs    Vitals:   02/21/22 2131 02/21/22 2312 02/22/22 0400 02/22/22 0757  BP: 123/61   (!) 118/53  Pulse: 72 67  67  Resp:    16  Temp:  98.1 F (36.7 C) 98 F (36.7 C) 97.8 F (36.6 C)  TempSrc:  Oral Oral Oral  SpO2:  93%  95%  Weight:   84 kg   Height:        Intake/Output Summary (Last 24 hours) at 02/22/2022 1041 Last data filed at 02/22/2022 0429 Gross per 24 hour  Intake 360 ml  Output 2531 ml  Net -2171 ml      02/22/2022    4:00 AM 02/21/2022    3:55 AM 02/20/2022    4:13 AM  Last 3 Weights  Weight (lbs) 185 lb 3 oz 184 lb 15.5 oz 187 lb 6.3 oz  Weight (kg) 84 kg 83.9 kg 85 kg      Telemetry    Sinus rhythm, HR in the 50s-60s - Personally Reviewed  ECG    No new tracings - Personally Reviewed  Physical Exam   GEN: No acute distress.  Sitting comfortably in the bed  Neck: No JVD Cardiac: RRR, no murmurs, rubs, or gallops.  Respiratory: Clear to auscultation bilaterally. GI: Soft, nontender MS: No edema; No deformity. Neuro:  Nonfocal  Psych: Normal affect   Labs    High Sensitivity Troponin:   Recent Labs  Lab 02/15/22 1406 02/15/22 1635  TROPONINIHS 163* 277*     Chemistry Recent Labs  Lab 02/15/22 1406 02/16/22 0109 02/17/22 0119 02/20/22 0033 02/21/22 0007 02/22/22 0036  NA 141 142   < > 138 139 138  K 4.5 3.8   < > 4.6 4.6 4.6  CL 110 111   < > 109 108 109  CO2 22 22   < > 21* 23 20*  GLUCOSE 160* 115*   < > 103* 111* 215*  BUN 34* 30*   < > 37* 34* 33*  CREATININE 1.68* 1.76*   < > 1.88* 1.72* 2.01*  CALCIUM 8.5* 8.4*   < > 8.4* 8.6* 8.5*  MG  --  2.0  --   --   --   --  PROT 6.4*  --   --   --   --   --   ALBUMIN 4.0  --   --   --   --   --   AST 17  --   --   --   --   --   ALT 14  --   --   --   --   --   ALKPHOS 92  --   --   --   --   --   BILITOT 0.8  --   --   --   --   --   GFRNONAA 39* 37*   < > 34* 38* 31*  ANIONGAP 9 9   < > _0 < > = values in this interval not displayed.    Lipids  Recent Labs  Lab 02/16/22 0109  CHOL 110  TRIG 60  HDL 43  LDLCALC 55  CHOLHDL 2.6    Hematology Recent Labs  Lab 02/20/22 0033 02/21/22 0605 02/22/22 0036  WBC 8.3 8.5 9.3  RBC 2.82* 3.38* 3.05*  HGB 9.8* 11.9* 10.6*  HCT 28.7* 35.0* 31.1*  MCV 101.8* 103.6* 102.0*  MCH 34.8* 35.2* 34.8*  MCHC 34.1 34.0 34.1  RDW 13.0 13.1 13.2  PLT 169 185 174   Thyroid  Recent Labs  Lab 02/16/22 0109  TSH 5.579*  FREET4 1.10    BNP Recent Labs  Lab 02/15/22 1407 02/18/22 0036  BNP 1,060.9* 623.5*    DDimer No results for input(s): "DDIMER" in the last 168 hours.   Radiology    CARDIAC CATHETERIZATION  Result Date: 02/21/2022   Loretta Plume to [facility] LAD lesion is 80% stenosed with 95% stenosed side branch in Ost Cx.   CSI-orbital atherectomy was performed from LCx back into LM,  followed by 3 mm ScoreFlex angioplasty performed.   A drug-eluting stent was successfully placed (from ostial LM to proximal LCx) using a SYNERGY XD 3.0X38.  Deployed to 14 ATM-postdilated in the LCx to 3.6 mm and LM to 4.1 mm.   Ost LAD lesion is 75% stenosed.  Prox LAD-1 stent was widely patent.   Scoring balloon angioplasty was performed using a BALLN SCOREFLEX 3.0X15.  Followed by 1 Brief 5 Pulse Run of Shockwave Lithotripsy Performed to Prep the Lesion   A drug-eluting stent was successfully placed (through the LM-LCx stent from mid LM into proximal LAD overlapping proximal LAD stent) using a SYNERGY XD 3.0X24.  Deployed at high ATM to 3.2 mm.  Ostial portion postdilated to 3.3 mm as part of kissing balloon technique   Kissing balloon angioplasty was performed using 3.25 mm [place] balloons with PRT angioplasty of the LM using a 4.0 mm [place] balloon.   Post intervention, there is a 0% residual stenosis in the LM   Post intervention, the LCx side branch was reduced to 0% residual stenosis.   Post intervention, there is a 0% residual stenosis.  In the LAD POSTOP DIAGNOSIS Mid Left Main-Proximal LAD: Score flex angioplasty followed by shockwave lithotripsySuccessful IVUS guided, CSI atherectomy and Shockwave Lithotripsy assisted Bifurcation Left Main-LAD-LCx DES PCI using 2 stent culotte technique with final kissing balloon (3.25 mm [place] balloons) and POT of LM (4.0 mm [place] balloon): Ostial Left Main-Proximal LCx: CSI atherectomy (4 passes at low speed, distal to proximal) -> Synergy DES 3.0 mm x 38 mm (IVUS guided) -> post elevated to 3.5 mm in the LCx and 4.1 mm in the LM. Mid Left  Main-Proximal LAD-(IVUS guided) ScoreFlex atherotomy, followed by Shockwave Lithotripsy - Synergy DES 3.0 mm x 24 mm --> deployed to 3.3 mm in LAD and 4.0 mm in LM. 6 [place] RF V venous line placed for backup temporary pacemaker placement-no pacing required. 5 [place] RFA arterial line insertion as backup for possible IABP insertion-no backup  required. RECOMMENDATIONS Return to nursing units for sheath angiogram removal. IV fluids at 100 mL/h x 6 hours-we will order 20 mg IV Lasix in 3 hours. Reassess renal function, may need to check next week. Glenetta Hew, MD   Cardiac Studies   Left Heart Catheterization 02/18/2022 1.  Severe calcific diffuse left main disease with very severe distal left main stenosis, disease extending into the ostium of the left circumflex and LAD 2.  Continued patency of the stented segments in the LAD and left circumflex with no high-grade obstruction beyond the complex distal left main lesion 3.  Chronic occlusion of the RCA unchanged from the prior study   Recommend: Review case with colleagues.  The patient is not a candidate for cardiac surgery (88 years old, DNR).  However, he is functional and it would be reasonable to consider hemodynamically supported PCI that would have to involve atherectomy and bifurcation stenting of the distal left main bifurcation.  We will follow creatinine in the setting of his CKD and discuss options with colleagues as well as the patient and his family.  If he opts for medical therapy, the approach to his care would have to be palliative.  Diagnostic Dominance: Right   Echocardiogram 02/16/2022   1. Left ventricular ejection fraction, by estimation, is 45 to 50%. The  left ventricle has low normal function. The left ventricle has no regional  wall motion abnormalities. There is mild left ventricular hypertrophy.  Left ventricular diastolic  parameters are consistent with Grade II diastolic dysfunction  (pseudonormalization). Elevated left atrial pressure.   2. Right ventricular systolic function is normal. The right ventricular  size is normal.   3. Left atrial size was moderately dilated.   4. The mitral valve is abnormal. Mild mitral valve regurgitation. No  evidence of mitral stenosis.   5. The aortic valve is tricuspid. There is moderate calcification of the   aortic valve. There is moderate thickening of the aortic valve. Aortic  valve regurgitation is mild to moderate.   6. Aortic dilatation noted. There is mild dilatation of the ascending  aorta, measuring 40 mm.   7. The inferior vena cava is normal in size with greater than 50%  respiratory variability, suggesting right atrial pressure of 3 mmHg.   Patient Profile     88 y.o. male with extensive history of CAD (previous STEMI, stents to proximal LAD and mid LCx 2019, chronic total occlusion of RCA), presents with acute on chronic exacerbation of heart failure with mildly reduced left ventricular systolic function (EF 41% by echo July 2023), on background of hypertension, hypercholesterolemia, paroxysmal atrial fibrillation on chronic anticoagulation, CKD stage IIIb, BPH  Assessment & Plan    Acute on Chronic combined Systolic and Diastolic CHF  Ischemic Cardiomyopathy  CAD  Elevated Trop  - Echo this admission showed EV 45-50%, no regional wall motion abnormalities, grade II diastolic dysfunction, normal RV systolic function  - Patient presented with evidence of hypervolemia-- BNP elevated to 1060, CXR with mild, diffuse bilateral Insterstitial opacity and small bilateral pleural effusions - Patient underwent LHC that showed severe calcific diffuse left main disease.  -s/p complex PCI to the distal  LM/proximal LAD/LCX bifurcation yesterday with excellent result. - Continue metoprolol 12.5 mg BID - Additional GDMT limited by renal function - We have been holding lasix the past few days to protect renal function prior to cath. Urine output subjectively adequate per patient.  - Creatinine has trended up further after cath - now 2.01. - Continue ASA, plavix, metoprolol 12.5 mg BID, lipitor 40 mg daily. Holding lasix.   Paroxysmal Atrial Fibrillation  - Patient is maintaining rhythm per telemetry with HR in the 50s-60s  - Eliquis held for cath, but now on DAPT with ASA/Plavix for more than  12 months - since we traditionally only due "triple therapy for one month" then stop one of the antiplatelets, this is not an option in this situation. Will therefore not add back Eliquis.  He has only had 2 episodes of A-fib, the first was 4 years ago in the setting of coronary disease and the most recent one was just prior to this episode.  I suspect an ischemic A-fib.  I discussed the risks and benefits with him and his daughter who is a Designer, jewellery and fairly versed in anticoagulation and we have agreed to hold off on triple therapy at this time. - Hemoglobin appears relatively stable. - Continue metoprolol as above    AKI on CKD stage IIIb - Creatinine ranged from 1.56- 1.76 for the past month.  Now trending upward to 2.1 today - may be contrast related. Holding lasix. Monitor.  Encourage p.o. hydration.  We will monitor today, possible DC home tomorrow.  Ambulate with cardiac rehabilitation and recommend follow-up with them.  For questions or updates, please contact [place] Please consult [email or web address] for contact info under   Pixie Casino, MD, FACC, [place] Director of the [facility]  [facility] Diplomate of the American Board of Clinical Lipidology Attending Cardiologist  Direct Dial: [phone]  Fax: [phone]  Website:  [email or web address]  Pixie Casino, MD  02/22/2022, 10:41 AM

## 2022-02-22 NOTE — Progress Notes (Signed)
Mobility Specialist Progress Note:   02/22/22 0926  Mobility  Activity Ambulated with assistance in hallway  Level of Assistance Contact guard assist, steadying assist  Assistive Device Front wheel walker  Distance Ambulated (ft) 660 ft  Activity Response Tolerated well  $Mobility charge 1 Mobility   Pt received in bed willing to participate in mobility. Complaints of R knee pain d/t arthritis. Left in bed with call bell in reach and all needs met.   [facility] [place] Mobility Specialist

## 2022-02-23 ENCOUNTER — Other Ambulatory Visit: Payer: Self-pay | Admitting: Student

## 2022-02-23 DIAGNOSIS — Z79899 Other long term (current) drug therapy: Secondary | ICD-10-CM

## 2022-02-23 DIAGNOSIS — I5021 Acute systolic (congestive) heart failure: Secondary | ICD-10-CM

## 2022-02-23 DIAGNOSIS — I5022 Chronic systolic (congestive) heart failure: Secondary | ICD-10-CM

## 2022-02-23 LAB — CBC
HCT: 29.6 % — ABNORMAL LOW (ref 39.0–52.0)
Hemoglobin: 10.2 g/dL — ABNORMAL LOW (ref 13.0–17.0)
MCH: 35.2 pg — ABNORMAL HIGH (ref 26.0–34.0)
MCHC: 34.5 g/dL (ref 30.0–36.0)
MCV: 102.1 fL — ABNORMAL HIGH (ref 80.0–100.0)
Platelets: 167 10*3/uL (ref 150–400)
RBC: 2.9 MIL/uL — ABNORMAL LOW (ref 4.22–5.81)
RDW: 13.2 % (ref 11.5–15.5)
WBC: 9.9 10*3/uL (ref 4.0–10.5)
nRBC: 0 % (ref 0.0–0.2)

## 2022-02-23 LAB — BASIC METABOLIC PANEL
Anion gap: 8 (ref 5–15)
BUN: 45 mg/dL — ABNORMAL HIGH (ref 8–23)
CO2: 24 mmol/L (ref 22–32)
Calcium: 8.3 mg/dL — ABNORMAL LOW (ref 8.9–10.3)
Chloride: 104 mmol/L (ref 98–111)
Creatinine, Ser: 2.18 mg/dL — ABNORMAL HIGH (ref 0.61–1.24)
GFR, Estimated: 28 mL/min — ABNORMAL LOW (ref >60)
Glucose, Bld: 129 mg/dL — ABNORMAL HIGH (ref 70–99)
Potassium: 4.6 mmol/L (ref 3.5–5.1)
Sodium: 136 mmol/L (ref 135–145)

## 2022-02-23 MED ORDER — METOPROLOL TARTRATE 25 MG PO TABS: 12.5000 mg | ORAL_TABLET | Freq: Two times a day (BID) | ORAL | 1 refills | Status: AC

## 2022-02-23 MED ORDER — CLOPIDOGREL BISULFATE 75 MG PO TABS: 75.0000 mg | ORAL_TABLET | Freq: Every day | ORAL | 2 refills | Status: DC

## 2022-02-23 MED ORDER — ASPIRIN 81 MG PO TBEC: 81.0000 mg | DELAYED_RELEASE_TABLET | Freq: Every day | ORAL | 12 refills | Status: AC

## 2022-02-23 NOTE — Discharge Summary (Signed)
Discharge Summary    Patient ID: Paul Kramer MRN: [id number]; DOB: May 22, 1930  Admit date: 02/15/2022 Discharge date: 02/23/2022  PCP:  Lajean Manes, [place] Providers Cardiologist:  Glenetta Hew, MD        Discharge Diagnoses    Principal Problem:   ACS (acute coronary syndrome) [facility]) Active Problems:   Essential hypertension   Hyperlipidemia with target low density lipoprotein (LDL) cholesterol less than 70 mg/dL   PAF (paroxysmal atrial fibrillation) (HCC); CHA2DS2-VASc Score -7   Acute renal failure superimposed on stage 3 chronic kidney disease ([place])   Left main coronary artery disease   NSTEMI (non-ST elevated myocardial infarction) ([place])   Acute HFrEF (heart failure with reduced ejection fraction) ([place])   Diagnostic Studies/Procedures    Echocardiogram: 02/16/2022 IMPRESSIONS     1. Left ventricular ejection fraction, by estimation, is 45 to 50%. The  left ventricle has low normal function. The left ventricle has no regional  wall motion abnormalities. There is mild left ventricular hypertrophy.  Left ventricular diastolic  parameters are consistent with Grade II diastolic dysfunction  (pseudonormalization). Elevated left atrial pressure.   2. Right ventricular systolic function is normal. The right ventricular  size is normal.   3. Left atrial size was moderately dilated.   4. The mitral valve is abnormal. Mild mitral valve regurgitation. No  evidence of mitral stenosis.   5. The aortic valve is tricuspid. There is moderate calcification of the  aortic valve. There is moderate thickening of the aortic valve. Aortic  valve regurgitation is mild to moderate.   6. Aortic dilatation noted. There is mild dilatation of the ascending  aorta, measuring 40 mm.   7. The inferior vena cava is normal in size with greater than 50%  respiratory variability, suggesting right atrial pressure of 3 mmHg.    LHC: 02/18/2022 1.  Severe calcific  diffuse left main disease with very severe distal left main stenosis, disease extending into the ostium of the left circumflex and LAD 2.  Continued patency of the stented segments in the LAD and left circumflex with no high-grade obstruction beyond the complex distal left main lesion 3.  Chronic occlusion of the RCA unchanged from the prior study   Recommend: Review case with colleagues.  The patient is not a candidate for cardiac surgery (88 years old, DNR).  However, he is functional and it would be reasonable to consider hemodynamically supported PCI that would have to involve atherectomy and bifurcation stenting of the distal left main bifurcation.  We will follow creatinine in the setting of his CKD and discuss options with colleagues as well as the patient and his family.  If he opts for medical therapy, the approach to his care would have to be palliative.  Coronary Stent Intervention: 02/21/2022 [place] LM to [facility] LAD lesion is 80% stenosed with 95% stenosed side branch in Ost Cx.   CSI-orbital atherectomy was performed from LCx back into LM, followed by 3 mm ScoreFlex angioplasty performed.   A drug-eluting stent was successfully placed (from ostial LM to proximal LCx) using a SYNERGY XD 3.0X38.  Deployed to 14 ATM-postdilated in the LCx to 3.6 mm and LM to 4.1 mm.   Ost LAD lesion is 75% stenosed.  Prox LAD-1 stent was widely patent.   Scoring balloon angioplasty was performed using a BALLN SCOREFLEX 3.0X15.  Followed by 1 Brief 5 Pulse Run of Shockwave Lithotripsy Performed to Prep the Lesion   A drug-eluting stent was  successfully placed (through the LM-LCx stent from mid LM into proximal LAD overlapping proximal LAD stent) using a SYNERGY XD 3.0X24.  Deployed at high ATM to 3.2 mm.  Ostial portion postdilated to 3.3 mm as part of kissing balloon technique   Kissing balloon angioplasty was performed using 3.25 mm [place] balloons with PRT angioplasty of the LM using a 4.0 mm [place] balloon.   Post  intervention, there is a 0% residual stenosis in the LM   Post intervention, the LCx side branch was reduced to 0% residual stenosis.   Post intervention, there is a 0% residual stenosis.  In the LAD   POSTOP DIAGNOSIS Mid Left Main-Proximal LAD: Score flex angioplasty followed by shockwave lithotripsySuccessful IVUS guided, CSI atherectomy and Shockwave Lithotripsy assisted Bifurcation Left Main-LAD-LCx DES PCI using 2 stent culotte technique with final kissing balloon (3.25 mm [place] balloons) and POT of LM (4.0 mm [place] balloon): Ostial Left Main-Proximal LCx: CSI atherectomy (4 passes at low speed, distal to proximal) -> Synergy DES 3.0 mm x 38 mm (IVUS guided) -> post elevated to 3.5 mm in the LCx and 4.1 mm in the LM. Mid Left Main-Proximal LAD-(IVUS guided) ScoreFlex atherotomy, followed by Shockwave Lithotripsy - Synergy DES 3.0 mm x 24 mm --> deployed to 3.3 mm in LAD and 4.0 mm in LM. 6 [place] RF V venous line placed for backup temporary pacemaker placement-no pacing required. 5 [place] RFA arterial line insertion as backup for possible IABP insertion-no backup required.   RECOMMENDATIONS Return to nursing units for sheath angiogram removal. IV fluids at 100 mL/h x 6 hours-we will order 20 mg IV Lasix in 3 hours. Reassess renal function, may need to check next week.    History of Present Illness     Paul Kramer is a 88 y.o. male with past medical history of CAD (s/p STEMI in 2019 with cath showing CTO of RCA and underwent staged angioplasty to OM2, DES to LCx and DES to proximal LAD), HFmrEF (EF 45-50% in 11/2017), paroxysmal atrial fibrillation, Stage 3 CKD, HTN and HLD who presented to [place] [facility] on 02/15/2022 for evaluation of worsening dyspnea.   He reported progressive dyspnea for the past 3 days while on vacation and had also experienced worsening lower extremity edema. He was found to have a CHF exacerbation with BNP at 1060 and to have an NSTEMI with initial Hs Troponin  values at 163 and 277. He was transferred to [facility] for further management.   Hospital Course     Consultants: None   He was initially diuresed with IV Lasix and once his respiratory status had improved and renal function stabilized, he was scheduled for a cardiac catheterization. This was performed on 02/18/2022 and showed severe disease along the left main with disease extending into the LCx and LAD. He did have patency of his previously placed LAD and LCx stents. Given his age and DNR status, it was recommended to either consider hemodynamically supported PCI of the LM or would need to opt for medical therapy and palliative care as he would not be a candidate for CABG. Echo showed a mildly reduced EF of 45-50% with no WMA. Did have Grade 2 DD, mild MR, mild to moderate AI and mild dilation of the ascending aorta at 40 mm.   Discussions were held between the patient, his family and the Interventional Cardiology team and it was decided to proceed with complex intervention to his LM. This was performed by Dr. Ellyn Hack on  02/21/2022 with CSI orbital atherectomy from the LCx into the LM followed by angioplasty with DES placement from the ostial LM to the proximal LCx. Then underwent angioplasty and shockwave lithotripsy with DES placement from the mid LM into the proximal LAD overlapping his previously placed proximal-LAD stent.   The following morning, he ambulated with cardiac rehab and reported dyspnea but no chest pain. Creatinine did trend up from 1.72 to 2.01, therefore it was recommended to monitor for an additional day. On 02/23/2022, he was examined by Dr. Debara Pickett and deemed stable for discharge. Creatinine was up to 2.18 but overall felt to be stabilizing, therefore was recommended to hold Lasix and recheck a BMET in 1 week. He was continued on ASA, Plavix (confirmed with the patient's daughter he already has this at home as he had been on this until 12/2021 when switched to Eliquis), Lopressor and  Atorvastatin. Discussions were held regarding the use of triple therapy as he would need to be on ASA and Plavix for at least 12 months. Given his atrial fibrillation had occurred in the setting of ischemia this admission and during a prior admission in 2019, risks and benefits were reviewed and it was recommended to hold off on resuming Eliquis at this time. Orders have been entered for a BMET next week and a staff message has been sent to the office to arrange for a TOC call. Close follow-up has been arranged for 03/04/2022.   Did the patient have an acute coronary syndrome (MI, NSTEMI, STEMI, etc) this admission?:  Yes                               AHA/ACC Clinical Performance & Quality Measures: Aspirin prescribed? - Yes ADP Receptor Inhibitor (Plavix/Clopidogrel, Brilinta/Ticagrelor or Effient/Prasugrel) prescribed (includes medically managed patients)? - Yes Beta Blocker prescribed? - Yes High Intensity Statin (Lipitor 40-20m or Crestor 20-449m prescribed? - Yes EF assessed during THIS hospitalization? - Yes For EF <40%, was ACEI/ARB prescribed? - Not Applicable (EF >/= 4055%For EF <40%, Aldosterone Antagonist (Spironolactone or Eplerenone) prescribed? - Not Applicable (EF >/= 4073%Cardiac Rehab Phase II ordered (including medically managed patients)? - Yes       The patient will be scheduled for a TOC follow up appointment in 7-10 days days.  A message has been sent to the [place] [facility] [place] [facility] at the office where the patient should be seen for follow up.  _____________  Discharge Vitals Blood pressure (!) 126/56, pulse 60, temperature 97.7 F (36.5 C), temperature source Oral, resp. rate 19, height _0  (1.676 m), weight 84 kg, SpO2 98 %.  Filed Weights   02/20/22 0413 02/21/22 0355 02/22/22 0400  Weight: 85 kg 83.9 kg 84 kg    Labs & Radiologic Studies    CBC Recent Labs    02/22/22 0036 02/23/22 0004  WBC 9.3 9.9  HGB 10.6* 10.2*  HCT 31.1* 29.6*  MCV 102.0*  102.1*  PLT 174 16220 Basic Metabolic Panel Recent Labs    02/22/22 0036 02/23/22 0004  NA 138 136  K 4.6 4.6  CL 109 104  CO2 20* 24  GLUCOSE 215* 129*  BUN 33* 45*  CREATININE 2.01* 2.18*  CALCIUM 8.5* 8.3*   Liver Function Tests No results for input(s): "AST", "ALT", "ALKPHOS", "BILITOT", "PROT", "ALBUMIN" in the last 72 hours. No results for input(s): "LIPASE", "AMYLASE" in the last 72 hours. High Sensitivity Troponin:  Recent Labs  Lab 02/15/22 1406 02/15/22 1635  TROPONINIHS 163* 277*    BNP Invalid input(s): "POCBNP" D-Dimer No results for input(s): "DDIMER" in the last 72 hours. Hemoglobin A1C No results for input(s): "HGBA1C" in the last 72 hours. Fasting Lipid Panel No results for input(s): "CHOL", "HDL", "LDLCALC", "TRIG", "CHOLHDL", "LDLDIRECT" in the last 72 hours. Thyroid Function Tests No results for input(s): "TSH", "T4TOTAL", "T3FREE", "THYROIDAB" in the last 72 hours.  Invalid input(s): "FREET3" _____________  DG Chest 2 View  Result Date: 02/15/2022 CLINICAL DATA:  Shortness of breath, wheezing EXAM: CHEST - 2 VIEW COMPARISON:  12/03/2017 FINDINGS: The heart size and mediastinal contours are within normal limits. Small, benign calcified left hilar lymph nodes. Mild, diffuse bilateral interstitial opacity and small bilateral pleural effusions. The visualized skeletal structures are unremarkable. IMPRESSION: Mild, diffuse bilateral interstitial opacity and small bilateral pleural effusions, findings consistent with edema or infection. No focal airspace opacity. Electronically Signed   By: Delanna Ahmadi M.D.   On: 02/15/2022 14:05    Disposition   Pt is being discharged home today in good condition.  Follow-up Plans & Appointments     Follow-up Information     Almyra Deforest, [place] Follow up on 03/04/2022.   Specialties: Cardiology, Radiology Why: Cardiology Hospital Follow-up on 03/04/2022 at 10:05 AM. Please have lab work prior to this at our office or  with your PCP. Need a Basic Metabolic Panel to recheck your kidney function. Contact information: [address] [place] [address] [phone]                Discharge Instructions     Amb Referral to Cardiac Rehabilitation   Complete by: As directed    Diagnosis:  NSTEMI PTCA Coronary Stents Other     After initial evaluation and assessments completed: Virtual Based Care may be provided alone or in conjunction with Phase 2 Cardiac Rehab based on patient barriers.: Yes   Intensive Cardiac Rehabilitation (ICR) [place] location only OR Traditional Cardiac Rehabilitation (TCR) If criteria for ICR are not met will enroll in TCR [facility] only): Yes   Diet - low sodium heart healthy   Complete by: As directed    Discharge instructions   Complete by: As directed    [place].  PLEASE ATTEND ALL SCHEDULED FOLLOW-UP APPOINTMENTS.   Activity: Increase activity slowly as tolerated. You may shower, but no soaking baths (or swimming) for 1 week. No driving for 24 hours. No lifting over 5 lbs for 1 week. No sexual activity for 1 week.   Wound Care: You may wash cath site gently with soap and water. Keep cath site clean and dry. If you notice pain, swelling, bleeding or pus at your cath site, please call [phone].       Discharge Medications   Allergies as of 02/23/2022       Reactions   Other Shortness Of Breath   Esters   Robitussin Dm Max Day-night Anaphylaxis, Swelling   Throat swelling   Banana Other (See Comments)   Severe indigestion   Contrast Media [iodinated Contrast Media] Other (See Comments)   Just feel bad all over   Morphine And Related Other (See Comments)   Patient states he does not like to take Morphine as it gives him nightmares and hallucinations.         Medication List     STOP taking these medications  Eliquis 2.5 MG Tabs tablet Generic drug:  apixaban   furosemide 20 MG tablet Commonly known as: LASIX       TAKE these medications    acetaminophen 500 MG tablet Commonly known as: TYLENOL Take 500 mg by mouth every 6 (six) hours as needed for mild pain or headache.   allopurinol 100 MG tablet Commonly known as: ZYLOPRIM Take 100 mg by mouth daily.   aspirin EC 81 MG tablet Take 1 tablet (81 mg total) by mouth daily. Swallow whole. Start taking on: February 24, 2022   atorvastatin 40 MG tablet Commonly known as: LIPITOR TAKE 1 TABLET DAILY AT 6 P.M. What changed: See the new instructions.   [place] Dream Arthritis 0.025 % Crea Generic drug: Histamine Dihydrochloride Apply 1 Application topically daily as needed (knee pain).   CENTRUM SILVER PO Take 1 tablet by mouth daily.   clopidogrel 75 MG tablet Commonly known as: PLAVIX Take 1 tablet (75 mg total) by mouth daily. Start taking on: February 24, 2022   diphenhydrAMINE 25 MG tablet Commonly known as: BENADRYL Take 25 mg by mouth every 6 (six) hours as needed for allergies.   fluticasone 50 MCG/ACT nasal spray Commonly known as: FLONASE Place 1 spray into both nostrils daily.   Melatonin 10 MG Caps Take 10 mg by mouth at bedtime as needed (sleep).   metoprolol tartrate 25 MG tablet Commonly known as: LOPRESSOR Take 0.5 tablets (12.5 mg total) by mouth 2 (two) times daily.   nitroGLYCERIN 0.4 MG SL tablet Commonly known as: NITROSTAT DISSOLVE ONE TABLET UNDER THE TONGUE EVERY 5 MINUTES AS NEEDED FOR CHEST PAIN.  DO NOT EXCEED A TOTAL OF 3 DOSES IN 15 MINUTES What changed: See the new instructions.   terazosin 2 MG capsule Commonly known as: HYTRIN Take 4 mg by mouth every evening.   Vitamin B-12 5000 MCG Tbdp Take 5,000 mcg by mouth daily.   Vitamin D 50 MCG (2000 UT) Caps Take 2,000 Units by mouth daily.           Outstanding Labs/Studies   BMET in 1 week  Duration of Discharge Encounter   Greater than 30 minutes including  physician time.  Signed, Erma Heritage, PA-C 02/23/2022, 11:20 AM

## 2022-02-23 NOTE — Progress Notes (Signed)
Progress Note  Patient Name: Paul Kramer Date of Encounter: 02/23/2022  [facility] Cardiologist: Glenetta Hew, MD   Subjective   Creatinine stabilizing today at 2.18 (slightly up from 2.01).   Inpatient Medications    Scheduled Meds:  allopurinol  100 mg Oral Daily   aspirin EC  81 mg Oral Daily   atorvastatin  40 mg Oral QHS   clopidogrel  75 mg Oral Daily   cyanocobalamin  5,000 mcg Oral Daily   fluticasone  1 spray Each Nare Daily   heparin  5,000 Units Subcutaneous Q8H   melatonin  10 mg Oral QHS   metoprolol tartrate  12.5 mg Oral BID   multivitamin with minerals  1 tablet Oral Daily   psyllium  1 packet Oral Daily   sodium chloride flush  3 mL Intravenous Q12H   sodium chloride flush  3 mL Intravenous Q12H   sodium chloride flush  3 mL Intravenous Q12H   terazosin  4 mg Oral QPM   Continuous Infusions:  sodium chloride     sodium chloride     PRN Meds: sodium chloride, sodium chloride, acetaminophen, albuterol, bisacodyl, nitroGLYCERIN, ondansetron (ZOFRAN) IV, mouth rinse, sodium chloride flush, sodium chloride flush   Vital Signs    Vitals:   02/22/22 1613 02/22/22 2012 02/23/22 0334 02/23/22 0739  BP: 127/67 136/66 115/78 (!) 126/56  Pulse: 61 61 (!) 51 60  Resp: '11 18 15 19  '$ Temp: 97.7 F (36.5 C) (!) 97.5 F (36.4 C) 97.7 F (36.5 C) 97.7 F (36.5 C)  TempSrc: Axillary Oral Oral Oral  SpO2: 94%   98%  Weight:      Height:        Intake/Output Summary (Last 24 hours) at 02/23/2022 0938 Last data filed at 02/23/2022 0700 Gross per 24 hour  Intake 220 ml  Output 900 ml  Net -680 ml      02/22/2022    4:00 AM 02/21/2022    3:55 AM 02/20/2022    4:13 AM  Last 3 Weights  Weight (lbs) 185 lb 3 oz 184 lb 15.5 oz 187 lb 6.3 oz  Weight (kg) 84 kg 83.9 kg 85 kg      Telemetry    Sinus rhythm, HR in the 50s-60s - Personally Reviewed  ECG    No new tracings - Personally Reviewed  Physical Exam   GEN: No acute distress. Sitting  comfortably in the bed  Neck: No JVD Cardiac: RRR, no murmurs, rubs, or gallops.  Respiratory: Clear to auscultation bilaterally. GI: Soft, nontender MS: No edema; No deformity. Neuro:  Nonfocal  Psych: Normal affect   Labs    High Sensitivity Troponin:   Recent Labs  Lab 02/15/22 1406 02/15/22 1635  TROPONINIHS 163* 277*     Chemistry Recent Labs  Lab 02/21/22 0007 02/22/22 0036 02/23/22 0004  NA 139 138 136  K 4.6 4.6 4.6  CL 108 109 104  CO2 23 20* 24  GLUCOSE 111* 215* 129*  BUN 34* 33* 45*  CREATININE 1.72* 2.01* 2.18*  CALCIUM 8.6* 8.5* 8.3*  GFRNONAA 38* 31* 28*  ANIONGAP '8 9 8    '$ Lipids  No results for input(s): "CHOL", "TRIG", "HDL", "LABVLDL", "LDLCALC", "CHOLHDL" in the last 168 hours.   Hematology Recent Labs  Lab 02/21/22 0605 02/22/22 0036 02/23/22 0004  WBC 8.5 9.3 9.9  RBC 3.38* 3.05* 2.90*  HGB 11.9* 10.6* 10.2*  HCT 35.0* 31.1* 29.6*  MCV 103.6* 102.0* 102.1*  MCH 35.2*  34.8* 35.2*  MCHC 34.0 34.1 34.5  RDW 13.1 13.2 13.2  PLT 185 174 167   Thyroid  No results for input(s): "TSH", "FREET4" in the last 168 hours.   BNP Recent Labs  Lab 02/18/22 0036  BNP 623.5*    DDimer No results for input(s): "DDIMER" in the last 168 hours.   Radiology    No results found.  Cardiac Studies   Left Heart Catheterization 02/18/2022 1.  Severe calcific diffuse left main disease with very severe distal left main stenosis, disease extending into the ostium of the left circumflex and LAD 2.  Continued patency of the stented segments in the LAD and left circumflex with no high-grade obstruction beyond the complex distal left main lesion 3.  Chronic occlusion of the RCA unchanged from the prior study   Recommend: Review case with colleagues.  The patient is not a candidate for cardiac surgery (88 years old, DNR).  However, he is functional and it would be reasonable to consider hemodynamically supported PCI that would have to involve atherectomy  and bifurcation stenting of the distal left main bifurcation.  We will follow creatinine in the setting of his CKD and discuss options with colleagues as well as the patient and his family.  If he opts for medical therapy, the approach to his care would have to be palliative.  Diagnostic Dominance: Right   Echocardiogram 02/16/2022   1. Left ventricular ejection fraction, by estimation, is 45 to 50%. The  left ventricle has low normal function. The left ventricle has no regional  wall motion abnormalities. There is mild left ventricular hypertrophy.  Left ventricular diastolic  parameters are consistent with Grade II diastolic dysfunction  (pseudonormalization). Elevated left atrial pressure.   2. Right ventricular systolic function is normal. The right ventricular  size is normal.   3. Left atrial size was moderately dilated.   4. The mitral valve is abnormal. Mild mitral valve regurgitation. No  evidence of mitral stenosis.   5. The aortic valve is tricuspid. There is moderate calcification of the  aortic valve. There is moderate thickening of the aortic valve. Aortic  valve regurgitation is mild to moderate.   6. Aortic dilatation noted. There is mild dilatation of the ascending  aorta, measuring 40 mm.   7. The inferior vena cava is normal in size with greater than 50%  respiratory variability, suggesting right atrial pressure of 3 mmHg.   Patient Profile     88 y.o. male with extensive history of CAD (previous STEMI, stents to proximal LAD and mid LCx 2019, chronic total occlusion of RCA), presents with acute on chronic exacerbation of heart failure with mildly reduced left ventricular systolic function (EF 40% by echo July 2023), on background of hypertension, hypercholesterolemia, paroxysmal atrial fibrillation on chronic anticoagulation, CKD stage IIIb, BPH  Assessment & Plan    Acute on Chronic combined Systolic and Diastolic CHF  Ischemic Cardiomyopathy  CAD  Elevated Trop   - Echo this admission showed EV 45-50%, no regional wall motion abnormalities, grade II diastolic dysfunction, normal RV systolic function  - Patient presented with evidence of hypervolemia-- BNP elevated to 1060, CXR with mild, diffuse bilateral Insterstitial opacity and small bilateral pleural effusions - Patient underwent LHC that showed severe calcific diffuse left main disease.  -s/p complex PCI to the distal LM/proximal LAD/LCX bifurcation yesterday with excellent result. - Continue metoprolol 12.5 mg BID - Additional GDMT limited by renal function - We have been holding lasix the past few  days to protect renal function prior to cath. Urine output subjectively adequate per patient.  - Creatinine has trended up further after cath - now 2.01. - Continue ASA, plavix, metoprolol 12.5 mg BID, lipitor 40 mg daily. Holding lasix.   Paroxysmal Atrial Fibrillation  - Patient is maintaining rhythm per telemetry with HR in the 50s-60s  - Eliquis held for cath, but now on DAPT with ASA/Plavix for more than 12 months - since we traditionally only due "triple therapy for one month" then stop one of the antiplatelets, this is not an option in this situation. Will therefore not add back Eliquis.  He has only had 2 episodes of A-fib, the first was 4 years ago in the setting of coronary disease and the most recent one was just prior to this episode.  I suspect an ischemic A-fib.  I discussed the risks and benefits with him and his daughter who is a Designer, jewellery and fairly versed in anticoagulation and we have agreed to hold off on triple therapy at this time. - Hemoglobin appears relatively stable. - Continue metoprolol as above    AKI on CKD stage IIIb - Creatinine ranged from 1.56- 1.76 for the past month.  Now trending upward to 2.18 today, but appears to be plateuing - suspect CIN. Holding lasix. Monitor.  Encourage p.o. hydration. Recommend repeat BMET next week as outpatient.  Creatinine  increased, but may be peaking. Clinically stable. Ambulating without difficult. Hemoglobin stable. [place] for d/c home today. Daugther (who is an NP) lives 4 houses away from him and will monitor.  For questions or updates, please contact [place] Please consult [email or web address] for contact info under   Pixie Casino, MD, FACC, [place] Director of the [facility]  [facility] Diplomate of the American Board of Clinical Lipidology Attending Cardiologist  Direct Dial: [phone]  Fax: [phone]  Website:  [email or web address]  Pixie Casino, MD  02/23/2022, 9:38 AM

## 2022-02-23 NOTE — Progress Notes (Signed)
Pt got discharged to home, discharge instructions provided and patient showed understanding to it, IV taken out,Telemonitor DC,pt left unit in wheelchair with all of the belongings accompanied with a family member (Daughter)  Orris Perin, RN 

## 2022-02-23 NOTE — Plan of Care (Signed)
  Problem: Education: Goal: Knowledge of General Education information will improve Description Including pain rating scale, medication(s)/side effects and non-pharmacologic comfort measures Outcome: Completed/Met   Problem: Health Behavior/Discharge Planning: Goal: Ability to manage health-related needs will improve Outcome: Completed/Met   Problem: Clinical Measurements: Goal: Ability to maintain clinical measurements within normal limits will improve Outcome: Completed/Met Goal: Will remain free from infection Outcome: Completed/Met Goal: Diagnostic test results will improve Outcome: Completed/Met Goal: Respiratory complications will improve Outcome: Completed/Met Goal: Cardiovascular complication will be avoided Outcome: Completed/Met   Problem: Activity: Goal: Risk for activity intolerance will decrease Outcome: Completed/Met   Problem: Nutrition: Goal: Adequate nutrition will be maintained Outcome: Completed/Met   Problem: Coping: Goal: Level of anxiety will decrease Outcome: Completed/Met   Problem: Elimination: Goal: Will not experience complications related to bowel motility Outcome: Completed/Met Goal: Will not experience complications related to urinary retention Outcome: Completed/Met   Problem: Pain Managment: Goal: General experience of comfort will improve Outcome: Completed/Met   Problem: Safety: Goal: Ability to remain free from injury will improve Outcome: Completed/Met   Problem: Skin Integrity: Goal: Risk for impaired skin integrity will decrease Outcome: Completed/Met   Problem: Education: Goal: Understanding of cardiac disease, CV risk reduction, and recovery process will improve Outcome: Completed/Met Goal: Individualized Educational Video(s) Outcome: Completed/Met   Problem: Activity: Goal: Ability to tolerate increased activity will improve Outcome: Completed/Met   Problem: Cardiac: Goal: Ability to achieve and maintain adequate  cardiovascular perfusion will improve Outcome: Completed/Met   Problem: Health Behavior/Discharge Planning: Goal: Ability to safely manage health-related needs after discharge will improve Outcome: Completed/Met   Problem: Education: Goal: Understanding of CV disease, CV risk reduction, and recovery process will improve Outcome: Completed/Met Goal: Individualized Educational Video(s) Outcome: Completed/Met   Problem: Activity: Goal: Ability to return to baseline activity level will improve Outcome: Completed/Met   Problem: Cardiovascular: Goal: Ability to achieve and maintain adequate cardiovascular perfusion will improve Outcome: Completed/Met Goal: Vascular access site(s) Level 0-1 will be maintained Outcome: Completed/Met   Problem: Health Behavior/Discharge Planning: Goal: Ability to safely manage health-related needs after discharge will improve Outcome: Completed/Met   

## 2022-02-24 ENCOUNTER — Telehealth: Payer: Self-pay

## 2022-02-24 ENCOUNTER — Ambulatory Visit (HOSPITAL_COMMUNITY): Payer: Medicare Other | Admitting: Physician Assistant

## 2022-02-24 ENCOUNTER — Encounter (HOSPITAL_COMMUNITY): Payer: Self-pay | Admitting: Cardiology

## 2022-02-24 NOTE — Telephone Encounter (Signed)
-----   Message from Paul Kramer, [place] sent at 02/23/2022 10:27 AM EDT ----- Regarding: Hospital Follow-up TOC Good morning,   This patient will need a TOC Call. I scheduled him to follow-up with Isaac Laud on 03/04/2022 at 10:05 AM but please double check to make sure this shows up correctly given the department changes this weekend.   Thanks,  [place]

## 2022-02-25 ENCOUNTER — Other Ambulatory Visit (HOSPITAL_BASED_OUTPATIENT_CLINIC_OR_DEPARTMENT_OTHER): Payer: Self-pay | Admitting: Internal Medicine

## 2022-02-25 DIAGNOSIS — Z79899 Other long term (current) drug therapy: Secondary | ICD-10-CM | POA: Diagnosis not present

## 2022-02-26 LAB — BASIC METABOLIC PANEL
BUN/Creatinine Ratio: 18 (ref 10–24)
BUN: 32 mg/dL — ABNORMAL HIGH (ref 8–27)
CO2: 21 mmol/L (ref 20–29)
Calcium: 8.4 mg/dL — ABNORMAL LOW (ref 8.6–10.2)
Chloride: 102 mmol/L (ref 96–106)
Creatinine, Ser: 1.8 mg/dL — ABNORMAL HIGH (ref 0.76–1.27)
Glucose: 167 mg/dL — ABNORMAL HIGH (ref 70–99)
Potassium: 4.4 mmol/L (ref 3.5–5.2)
Sodium: 137 mmol/L (ref 134–144)
eGFR: 36 mL/min/{1.73_m2} — ABNORMAL LOW (ref >59)

## 2022-02-26 NOTE — Telephone Encounter (Signed)
Patient contacted regarding discharge from [facility] on 02/23/22  Patient understands to follow up with provider Eulas Post on 03/04/22 at 10:05 am  at [facility]. Patient understands discharge instructions? yes  Patient understands medications and regiment? yes  Patient understands to bring all medications to this visit? yes

## 2022-03-04 ENCOUNTER — Ambulatory Visit: Payer: Medicare Other | Attending: Physician Assistant | Admitting: Physician Assistant

## 2022-03-04 ENCOUNTER — Encounter: Payer: Self-pay | Admitting: Physician Assistant

## 2022-03-04 VITALS — BP 130/64 | HR 56 | Ht 66.0 in | Wt 183.4 lb

## 2022-03-04 DIAGNOSIS — R109 Unspecified abdominal pain: Secondary | ICD-10-CM | POA: Diagnosis not present

## 2022-03-04 DIAGNOSIS — E785 Hyperlipidemia, unspecified: Secondary | ICD-10-CM | POA: Diagnosis not present

## 2022-03-04 DIAGNOSIS — N179 Acute kidney failure, unspecified: Secondary | ICD-10-CM | POA: Diagnosis not present

## 2022-03-04 DIAGNOSIS — I10 Essential (primary) hypertension: Secondary | ICD-10-CM | POA: Diagnosis not present

## 2022-03-04 DIAGNOSIS — D649 Anemia, unspecified: Secondary | ICD-10-CM | POA: Insufficient documentation

## 2022-03-04 DIAGNOSIS — I2510 Atherosclerotic heart disease of native coronary artery without angina pectoris: Secondary | ICD-10-CM | POA: Diagnosis not present

## 2022-03-04 DIAGNOSIS — I480 Paroxysmal atrial fibrillation: Secondary | ICD-10-CM | POA: Diagnosis not present

## 2022-03-04 DIAGNOSIS — I255 Ischemic cardiomyopathy: Secondary | ICD-10-CM | POA: Insufficient documentation

## 2022-03-04 NOTE — Patient Instructions (Signed)
Medication Instructions:  Your physician recommends that you continue on your current medications as directed. Please refer to the Current Medication list given to you today.  *If you need a refill on your cardiac medications before your next appointment, please call your pharmacy*  Lab Work: Your physician recommends that you return for lab work TODAY:  BMP CBC If you have labs (blood work) drawn today and your tests are completely normal, you will receive your results only by: [place] (if you have MyChart) OR A paper copy in the mail If you have any lab test that is abnormal or we need to change your treatment, we will call you to review the results.  Testing/Procedures: NONE ordered at this time of appointment   Follow-Up: At [facility], you and your health needs are our priority.  As part of our continuing mission to provide you with exceptional heart care, we have created designated Provider Care Teams.  These Care Teams include your primary Cardiologist (physician) and Advanced Practice Providers (APPs -  Physician Assistants and Nurse Practitioners) who all work together to provide you with the care you need, when you need it.  Your next appointment:   As previously scheduled   The format for your next appointment:   In Person  Provider:   Glenetta Hew, MD     Other Instructions   Important Information About [place]

## 2022-03-04 NOTE — Progress Notes (Unsigned)
Cardiology Office Note:    Date:  03/05/2022   ID:  Paul Kramer, DOB [birth year], MRN [id number]  PCP:  Paul Kramer, Paul Kramer Providers Cardiologist:  Paul Hew, MD     Referring MD: Paul Manes, MD   No chief complaint on file.   History of Present Illness:    Paul Kramer is a 88 y.o. male with a hx of CAD, PAF, HTN, HLD, and history of ischemic cardiomyopathy.  Patient was originally diagnosed with inferior STEMI in June 2019.  Emergent cardiac catheterization at the time revealed EF 45 to 50%, 80% proximal to mid RCA, mid to distal RCA had 100% occlusion with 90% stenosis in the sidebranch of the posterior atrial artery, 40% ostial to mid left main lesion, 95% stenosis in mid to distal left circumflex artery with 50% lesion in ostial OM 2, 85% lesion in ostial to proximal LAD.  Attempt was made to open up the RCA, however unable to cross the RCA lesion confirming CTO.  Patient returned to the Cath Lab on 12/07/2017 he underwent underwent staged balloon angioplasty of OM 2, DES to left circumflex, and DES to proximal LAD.  Echocardiogram obtained on 11/30/2017 showed EF 45 to 50%, moderate AI, mild to moderate MR.  Venous Doppler obtained on the same day was negative for DVT.  He is on low-dose amlodipine for antianginal benefit along with Plavix and Lipitor.  Not on ACE inhibitor or ARB due to concern of renal insufficiency.  He has not had any recurrence of A-fib since his MI, therefore based on previous discussion, he did not wish to be on full anticoagulation unless he has recurrence.  I previously saw the patient on 01/07/2022, patient was noted to have elevated heart rate for 10 days prior to the office visit.  Family noted his heart rate was irregularly irregular.  His son-in-law was a retired Magazine features editor and the daughter was a Designer, jewellery.  We started the patient on anticoagulation therapy due to suspicion for A-fib.  He also was noted to have 2+  pitting edema, I started the patient on Lasix 20 mg daily for 5 days before changed to as needed.  Echocardiogram obtained on 01/20/2022 showed EF 51%, no regional wall motion abnormality, grade 2 DD, moderate LAE, mild to moderate MR, moderate AI, dilated ascending aorta measuring at 43 mm.  When I saw the patient back on 01/28/2022, he was back in atrial fibrillation.  I discontinued his amlodipine and started on metoprolol 12.5 mg twice a day.  I referred the patient to A-fib clinic.  Patient ended up going to the hospital on 02/15/2022 with worsening dyspnea and lower extremity edema.  Serial troponin was elevated.  Repeat echocardiogram obtained on 02/16/2022 showed EF 45 to 50%, no regional wall motion abnormality, grade 2 DD, normal RV systolic function.  During the hospitalization, creatinine trended up to 2.05.  She eventually underwent cardiac catheterization on 02/18/2022 that showed chronic occlusion of RCA, severe calcific diffuse left main disease with very severe distal left main stenosis extending into the ostium of left circumflex and LAD.  He ultimately underwent orbital atherectomy performed from the left circumflex back into the left main on 02/21/2022, a Synergy XD 3.0 x 38 mm drug-eluting stent was placed from ostial left main to proximal left circumflex artery, postdilated to 3.6 mm in left circumflex and a 4.1 mm again left main.  Another drug-eluting stent was placed from mid left main into the proximal LAD  using 3.0 x 28 mm Synergy XD DES.  Given left main stent, patient was placed on aspirin and the Plavix.  Eliquis was not restarted as it was suspected his A-fib was in the setting of coronary ischemia.  Creatinine on discharge was 1.8.  Patient presents today accompanied by his daughter.  He has been feeling well since discharge from the hospital.  We emphasized Importance of compliance was to antiplatelet therapy.  On physical exam, his heart rate is quite regular.  He denies any chest pain  or worsening dyspnea.  When palpated his abdomen, he had a central abdominal sharp pain, this apparently is new for him.  I do not see any ecchymosis in the flank.  We will try to palpate his abdomen again, the pain was not as pronounced.  I will obtain CBC and a basic metabolic panel.  Unless CBC shows significant drop in hemoglobin and abdominal pain worsens, I will hold off on abdominal CT at this time.  Suspicion for retroperitoneal bleed is fairly low.  Basic metabolic panel is to check the renal function.  Blood pressure on arrival was initially high, I rechecked his blood pressure and it was 130/64.  He can follow-up with Dr. Ellyn Kramer in November as previously scheduled.   Past Medical History:  Diagnosis Date   First detected episode of atrial fibrillation ([place]) 11/29/2017   In setting of STEMI -- NO further documented episodes   Hypercholesteremia    Hypertension    Multiple vessel coronary artery disease     Cath 11/29/17 - 3 V CAD (CTO RCA), 95% m-d Cx (@ OM2 55%)-> PCI Cx-PTCA Om2; 85% p-m LAD => DES PCI    Myocardial infarction of inferolateral wall ([place]) 11/2017   ? prior Infarct; - Cath 11/29/17 - 3 V CAD (CTO RCA), 95% m-d Cx (@ OM2 55%)-> PCI Cx-PTCA Om2; 85% p-m LAD => DES PCI    Narcolepsy    pt may pass out when laughing   Spinal stenosis    TIA (transient ischemic attack)     Past Surgical History:  Procedure Laterality Date   ARTERIAL LINE INSERTION N/A 02/21/2022   Procedure: ARTERIAL LINE INSERTION;  Surgeon: Leonie Man, MD;  Location: [place] [facility];  Service: Cardiovascular;  Laterality: N/A;   CENTRAL LINE INSERTION  02/21/2022   Procedure: CENTRAL LINE INSERTION;  Surgeon: Leonie Man, MD;  Location: [place] [facility];  Service: Cardiovascular;;   CORONARY ATHERECTOMY N/A 02/21/2022   Procedure: CORONARY ATHERECTOMY;  Surgeon: Leonie Man, MD;  Location: [place] [facility];  Service: Cardiovascular;  Laterality: N/A;   CORONARY BALLOON ANGIOPLASTY  N/A 11/29/2017   Procedure: CORONARY BALLOON ANGIOPLASTY;  Surgeon: Leonie Man, MD;  Location: [facility];  Service: Cardiovascular;;  - PTCA of RCA - unable to cross -- LIKELY CTO.   CORONARY LITHOTRIPSY N/A 02/21/2022   Procedure: CORONARY LITHOTRIPSY;  Surgeon: Leonie Man, MD;  Location: [facility];  Service: Cardiovascular;  Laterality: N/A;   CORONARY STENT INTERVENTION N/A 12/07/2017   Procedure: CORONARY STENT INTERVENTION;  Surgeon: Leonie Man, MD;  Location: [place] [facility];  Service: Cardiovascular; Mid-dist LCx 95% with 55% OM2 => Scoring PTCA of OM2 (20%), LCx PCI - Synergy DES 3 x 16 (3.3 mm); prox LAD 85% PCI- Synergy DES 3 x 16 (3.6 mm).   CORONARY STENT INTERVENTION N/A 02/21/2022   Procedure: CORONARY STENT INTERVENTION;  Surgeon: Leonie Man, MD;  Location: [place] [facility];  Service: Cardiovascular;  Laterality: N/A;   LEFT HEART CATH N/A 12/07/2017   Procedure: Left Heart Cath;  Surgeon: Leonie Man, MD;  Location: [facility];  Service: Cardiovascular;  Laterality: N/A;   LEFT HEART CATH AND CORONARY ANGIOGRAPHY N/A 11/29/2017   Procedure: LEFT HEART CATH AND CORONARY ANGIOGRAPHY;  Surgeon: Leonie Man, MD;  Location: [facility];; mRCA CTO, p-m LAD 85%, m-dCx 95% @ OM2 55%. Mod High LVEDP    LEFT HEART CATH AND CORONARY ANGIOGRAPHY N/A 02/18/2022   Procedure: LEFT HEART CATH AND CORONARY ANGIOGRAPHY;  Surgeon: [name], MD;  Location: [facility];  Service: Cardiovascular;  Laterality: N/A;   LIPOMA EXCISION     SPINAL FIXATION SURGERY     TRANSTHORACIC ECHOCARDIOGRAM  11/30/2017   EF 45-50%.  Inferior hypokinesis.  Elevated filling pressures.  Mild to moderate MR.     Current Medications: Current Meds  Medication Sig   acetaminophen (TYLENOL) 500 MG tablet Take 500 mg by mouth every 6 (six) hours as needed for mild pain or headache.   allopurinol (ZYLOPRIM) 100 MG tablet Take 100 mg by mouth  daily.   aspirin EC 81 MG tablet Take 1 tablet (81 mg total) by mouth daily. Swallow whole.   atorvastatin (LIPITOR) 40 MG tablet TAKE 1 TABLET DAILY AT 6 P.M. (Patient taking differently: Take 40 mg by mouth every evening.)   Cholecalciferol (VITAMIN D) 50 MCG (2000 UT) CAPS Take 2,000 Units by mouth daily.   clopidogrel (PLAVIX) 75 MG tablet Take 1 tablet (75 mg total) by mouth daily.   Cyanocobalamin (VITAMIN B-12) 5000 MCG TBDP Take 5,000 mcg by mouth daily.   diphenhydrAMINE (BENADRYL) 25 MG tablet Take 25 mg by mouth every 6 (six) hours as needed for allergies.   fluticasone (FLONASE) 50 MCG/ACT nasal spray Place 1 spray into both nostrils daily.   Histamine Dihydrochloride (AUSTRALIAN DREAM ARTHRITIS) 0.025 % CREA Apply 1 Application topically daily as needed (knee pain).   Melatonin 10 MG CAPS Take 10 mg by mouth at bedtime as needed (sleep).   metoprolol tartrate (LOPRESSOR) 25 MG tablet Take 0.5 tablets (12.5 mg total) by mouth 2 (two) times daily.   Multiple Vitamins-Minerals (CENTRUM SILVER PO) Take 1 tablet by mouth daily.   nitroGLYCERIN (NITROSTAT) 0.4 MG SL tablet DISSOLVE ONE TABLET UNDER THE TONGUE EVERY 5 MINUTES AS NEEDED FOR CHEST PAIN.  DO NOT EXCEED A TOTAL OF 3 DOSES IN 15 MINUTES (Patient taking differently: Place 0.4 mg under the tongue every 5 (five) minutes x 3 doses as needed for chest pain.)   terazosin (HYTRIN) 2 MG capsule Take 4 mg by mouth every evening.     Allergies:   Other, Robitussin dm max day-night, Banana, Contrast media [iodinated contrast media], and Morphine and related   Social History   Socioeconomic History   Marital status: Unknown    Spouse name: Not on file   Number of children: Not on file   Years of education: Not on file   Highest education level: Not on file  Occupational History   Occupation: retired  Tobacco Use   Smoking status: Former   Smokeless tobacco: Never  Scientific laboratory technician Use: Not on file  Substance and Sexual  Activity   Alcohol use: Yes    Comment: rare   Drug use: Never   Sexual activity: Not on file  Other Topics Concern   Not on file  Social History Narrative   Not on file   Social Determinants of Health   Financial Resource Strain: Low Risk  (02/04/2018)   Overall Financial Resource Strain (CARDIA)    Difficulty of Paying Living Expenses: Not hard at all  Food Insecurity: No Food Insecurity (02/04/2018)   Hunger Vital Sign    Worried About Running Out of Food in the Last Year: Never true    Ran Out of Food in the Last Year: Never true  Transportation Needs: No Transportation Needs (02/04/2018)   PRAPARE - Hydrologist (Medical): No    Lack of Transportation (Non-Medical): No  Physical Activity: Inactive (02/04/2018)   Exercise Vital Sign    Days of Exercise per Week: 0 days    Minutes of Exercise per Session: 0 min  Stress: No Stress Concern Present (02/04/2018)   [place]    Feeling of Stress : Only a little  Social Connections: Not on file     Family History: The patient's Family history is unknown by patient.  ROS:   Please see the history of present illness.     All other systems reviewed and are negative.  EKGs/Labs/Other Studies Reviewed:    The following studies were reviewed today:  Echo 02/16/2022  1. Left ventricular ejection fraction, by estimation, is 45 to 50%. The  left ventricle has low normal function. The left ventricle has no regional  wall motion abnormalities. There is mild left ventricular hypertrophy.  Left ventricular diastolic  parameters are consistent with Grade II diastolic dysfunction  (pseudonormalization). Elevated left atrial pressure.   2. Right ventricular systolic function is normal. The right ventricular  size is normal.   3. Left atrial size was moderately dilated.   4. The mitral valve is abnormal. Mild mitral valve regurgitation. No  evidence  of mitral stenosis.   5. The aortic valve is tricuspid. There is moderate calcification of the  aortic valve. There is moderate thickening of the aortic valve. Aortic  valve regurgitation is mild to moderate.   6. Aortic dilatation noted. There is mild dilatation of the ascending  aorta, measuring 40 mm.   7. The inferior vena cava is normal in size with greater than 50%  respiratory variability, suggesting right atrial pressure of 3 mmHg.    Cath 02/18/2022 1.  Severe calcific diffuse left main disease with very severe distal left main stenosis, disease extending into the ostium of the left circumflex and LAD 2.  Continued patency of the stented segments in the LAD and left circumflex with no high-grade obstruction beyond the complex distal left main lesion 3.  Chronic occlusion of the RCA unchanged from the prior study   Recommend: Review case with colleagues.  The patient is not a candidate for cardiac surgery (88 years old, DNR).  However, he is functional and it would be reasonable to consider hemodynamically supported PCI that would have to involve atherectomy and bifurcation stenting of the distal left main bifurcation.  We will follow creatinine in the setting of his CKD and discuss options with colleagues as well as the patient and his family.  If he opts for medical therapy, the approach to his care would have to be palliative.   Cath 02/21/2022   Ost LM to Ost LAD lesion is 80% stenosed with 95% stenosed side branch in Ost Cx.   CSI-orbital atherectomy was performed from LCx back into LM, followed by 3 mm ScoreFlex  angioplasty performed.   A drug-eluting stent was successfully placed (from ostial LM to proximal LCx) using a SYNERGY XD 3.0X38.  Deployed to 14 ATM-postdilated in the LCx to 3.6 mm and LM to 4.1 mm.   Ost LAD lesion is 75% stenosed.  Prox LAD-1 stent was widely patent.   Scoring balloon angioplasty was performed using a BALLN SCOREFLEX 3.0X15.  Followed by 1 Brief 5 Pulse  Run of Shockwave Lithotripsy Performed to Prep the Lesion   A drug-eluting stent was successfully placed (through the LM-LCx stent from mid LM into proximal LAD overlapping proximal LAD stent) using a SYNERGY XD 3.0X24.  Deployed at high ATM to 3.2 mm.  Ostial portion postdilated to 3.3 mm as part of kissing balloon technique   Kissing balloon angioplasty was performed using 3.25 mm [place] balloons with PRT angioplasty of the LM using a 4.0 mm [place] balloon.   Post intervention, there is a 0% residual stenosis in the LM   Post intervention, the LCx side branch was reduced to 0% residual stenosis.   Post intervention, there is a 0% residual stenosis.  In the LAD   POSTOP DIAGNOSIS Mid Left Main-Proximal LAD: Score flex angioplasty followed by shockwave lithotripsySuccessful IVUS guided, CSI atherectomy and Shockwave Lithotripsy assisted Bifurcation Left Main-LAD-LCx DES PCI using 2 stent culotte technique with final kissing balloon (3.25 mm [place] balloons) and POT of LM (4.0 mm [place] balloon): Ostial Left Main-Proximal LCx: CSI atherectomy (4 passes at low speed, distal to proximal) -> Synergy DES 3.0 mm x 38 mm (IVUS guided) -> post elevated to 3.5 mm in the LCx and 4.1 mm in the LM. Mid Left Main-Proximal LAD-(IVUS guided) ScoreFlex atherotomy, followed by Shockwave Lithotripsy - Synergy DES 3.0 mm x 24 mm --> deployed to 3.3 mm in LAD and 4.0 mm in LM. 6 [place] RF V venous line placed for backup temporary pacemaker placement-no pacing required. 5 [place] RFA arterial line insertion as backup for possible IABP insertion-no backup required.   RECOMMENDATIONS Return to nursing units for sheath angiogram removal. IV fluids at 100 mL/h x 6 hours-we will order 20 mg IV Lasix in 3 hours. Reassess renal function, may need to check next week.   EKG:  EKG is not ordered today.    Recent Labs: 02/15/2022: ALT 14 02/16/2022: Magnesium 2.0; TSH 5.579 02/18/2022: B Natriuretic Peptide 623.5 03/04/2022: BUN 34;  Creatinine, Ser 1.68; Hemoglobin 11.8; Platelets 236; Potassium 5.1; Sodium 141  Recent Lipid Panel    Component Value Date/Time   CHOL 110 02/16/2022 0109   TRIG 60 02/16/2022 0109   HDL 43 02/16/2022 0109   CHOLHDL 2.6 02/16/2022 0109   VLDL 12 02/16/2022 0109   LDLCALC 55 02/16/2022 0109     Risk Assessment/Calculations:    CHA2DS2-VASc Score = 5   This indicates a 7.2% annual risk of stroke. The patient's score is based upon: CHF History: 1 HTN History: 1 Diabetes History: 0 Stroke History: 0 Vascular Disease History: 1 Age Score: 2 Gender Score: 0          Physical Exam:    VS:  BP 130/64   Pulse (!) 56   Ht '5\' 6"'  (1.676 m)   Wt 183 lb 6.4 oz (83.2 kg)   SpO2 97%   BMI 29.60 kg/m         Wt Readings from Last 3 Encounters:  03/04/22 183 lb 6.4 oz (83.2 kg)  02/23/22 188 lb 1.6 oz (85.3 kg)  01/28/22 191 lb (86.6  kg)     GEN:  Well nourished, well developed in no acute distress HEENT: Normal NECK: No JVD; No carotid bruits LYMPHATICS: No lymphadenopathy CARDIAC: RRR, no murmurs, rubs, gallops RESPIRATORY:  Clear to auscultation without rales, wheezing or rhonchi  ABDOMEN: Soft, non-tender, non-distended MUSCULOSKELETAL:  No edema; No deformity  SKIN: Warm and dry NEUROLOGIC:  Alert and oriented x 3 PSYCHIATRIC:  Normal affect   ASSESSMENT:    1. Coronary artery disease involving native coronary artery of native heart without angina pectoris   2. AKI (acute kidney injury) ([place])   3. Anemia, unspecified type   4. PAF (paroxysmal atrial fibrillation) ([place])   5. Primary hypertension   6. Hyperlipidemia LDL goal <70   7. Ischemic cardiomyopathy   8. Abdominal discomfort    PLAN:    In order of problems listed above:  CAD: Denies any further chest discomfort.  Recently underwent bifurcation PCI of left main into left circumflex and also left main into proximal LAD.  Given left main stenting, he was placed on aspirin and Plavix.  Eliquis was  discontinued given rare episode of PAF.  He likely will continue dual antiplatelet therapy for life given left main stenting.  Emphasis has been placed on compliance with dual antiplatelet therapy  AKI: Obtain basic metabolic panel  Anemia: Obtain CBC  PAF: No further recurrence.  On metoprolol.  It was felt the recent atrial fibrillation was driven by underlying ischemia.  Eliquis has been discontinued given the need for aspirin and Plavix due to left main stenting.  We will continue to monitor for A-fib episode using Kardia mobile device.  Hypertension: Blood pressure stable  Hyperlipidemia: On Lipitor  Ischemic cardiomyopathy: Baseline EF 45 to 50%.  Euvolemic on exam  Abdominal discomfort: Tenderness in umbilical area with palpation.  There was no ecchymosis on the flank area, suspicion for retroperitoneal bleed is fairly low.  We will continue to monitor.  Obtain CBC.    Cardiac Rehabilitation Eligibility Assessment            Medication Adjustments/Labs and Tests Ordered: Current medicines are reviewed at length with the patient today.  Concerns regarding medicines are outlined above.  Orders Placed This Encounter  Procedures   CBC   Basic metabolic panel   No orders of the defined types were placed in this encounter.   Patient Instructions  Medication Instructions:  Your physician recommends that you continue on your current medications as directed. Please refer to the Current Medication list given to you today.  *If you need a refill on your cardiac medications before your next appointment, please call your pharmacy*  Lab Work: Your physician recommends that you return for lab work TODAY:  BMP CBC If you have labs (blood work) drawn today and your tests are completely normal, you will receive your results only by: [place] (if you have MyChart) OR A paper copy in the mail If you have any lab test that is abnormal or we need to change your treatment, we  will call you to review the results.  Testing/Procedures: NONE ordered at this time of appointment   Follow-Up: At [facility], you and your health needs are our priority.  As part of our continuing mission to provide you with exceptional heart care, we have created designated Provider Care Teams.  These Care Teams include your primary Cardiologist (physician) and Advanced Practice Providers (APPs -  Physician Assistants and Nurse Practitioners) who all work together to provide you with  the care you need, when you need it.  Your next appointment:   As previously scheduled   The format for your next appointment:   In Person  Provider:   Glenetta Hew, MD     Other Instructions   Important Information About Sugar         Signed, Almyra Deforest, [place]  03/05/2022 11:59 PM    [place]

## 2022-03-05 LAB — BASIC METABOLIC PANEL
BUN/Creatinine Ratio: 20 (ref 10–24)
BUN: 34 mg/dL — ABNORMAL HIGH (ref 8–27)
CO2: 19 mmol/L — ABNORMAL LOW (ref 20–29)
Calcium: 8.9 mg/dL (ref 8.6–10.2)
Chloride: 106 mmol/L (ref 96–106)
Creatinine, Ser: 1.68 mg/dL — ABNORMAL HIGH (ref 0.76–1.27)
Glucose: 90 mg/dL (ref 70–99)
Potassium: 5.1 mmol/L (ref 3.5–5.2)
Sodium: 141 mmol/L (ref 134–144)
eGFR: 39 mL/min/{1.73_m2} — ABNORMAL LOW (ref >59)

## 2022-03-05 LAB — CBC
Hematocrit: 36.1 % — ABNORMAL LOW (ref 37.5–51.0)
Hemoglobin: 11.8 g/dL — ABNORMAL LOW (ref 13.0–17.7)
MCH: 34 pg — ABNORMAL HIGH (ref 26.6–33.0)
MCHC: 32.7 g/dL (ref 31.5–35.7)
MCV: 104 fL — ABNORMAL HIGH (ref 79–97)
Platelets: 236 10*3/uL (ref 150–450)
RBC: 3.47 x10E6/uL — ABNORMAL LOW (ref 4.14–5.80)
RDW: 13 % (ref 11.6–15.4)
WBC: 7.1 10*3/uL (ref 3.4–10.8)

## 2022-03-21 ENCOUNTER — Other Ambulatory Visit: Payer: Self-pay

## 2022-03-21 MED ORDER — CLOPIDOGREL BISULFATE 75 MG PO TABS: 75.0000 mg | ORAL_TABLET | Freq: Every day | ORAL | 2 refills | Status: DC

## 2022-03-26 DIAGNOSIS — E7800 Pure hypercholesterolemia, unspecified: Secondary | ICD-10-CM | POA: Diagnosis not present

## 2022-03-26 DIAGNOSIS — I480 Paroxysmal atrial fibrillation: Secondary | ICD-10-CM | POA: Diagnosis not present

## 2022-03-26 DIAGNOSIS — I270 Primary pulmonary hypertension: Secondary | ICD-10-CM | POA: Diagnosis not present

## 2022-03-26 DIAGNOSIS — Z1331 Encounter for screening for depression: Secondary | ICD-10-CM | POA: Diagnosis not present

## 2022-03-26 DIAGNOSIS — N2581 Secondary hyperparathyroidism of renal origin: Secondary | ICD-10-CM | POA: Diagnosis not present

## 2022-03-26 DIAGNOSIS — I509 Heart failure, unspecified: Secondary | ICD-10-CM | POA: Diagnosis not present

## 2022-03-26 DIAGNOSIS — Z23 Encounter for immunization: Secondary | ICD-10-CM | POA: Diagnosis not present

## 2022-03-26 DIAGNOSIS — I700 Atherosclerosis of aorta: Secondary | ICD-10-CM | POA: Diagnosis not present

## 2022-03-26 DIAGNOSIS — Z79899 Other long term (current) drug therapy: Secondary | ICD-10-CM | POA: Diagnosis not present

## 2022-03-26 DIAGNOSIS — I214 Non-ST elevation (NSTEMI) myocardial infarction: Secondary | ICD-10-CM | POA: Diagnosis not present

## 2022-03-26 DIAGNOSIS — Z0000 Encounter for general adult medical examination without abnormal findings: Secondary | ICD-10-CM | POA: Diagnosis not present

## 2022-03-26 DIAGNOSIS — R7303 Prediabetes: Secondary | ICD-10-CM | POA: Diagnosis not present

## 2022-03-26 MED ORDER — CLOPIDOGREL BISULFATE 75 MG PO TABS: 75.0000 mg | ORAL_TABLET | Freq: Every day | ORAL | 2 refills | Status: DC

## 2022-03-27 ENCOUNTER — Telehealth (HOSPITAL_COMMUNITY): Payer: Self-pay

## 2022-03-27 DIAGNOSIS — I129 Hypertensive chronic kidney disease with stage 1 through stage 4 chronic kidney disease, or unspecified chronic kidney disease: Secondary | ICD-10-CM | POA: Diagnosis not present

## 2022-03-27 DIAGNOSIS — N2581 Secondary hyperparathyroidism of renal origin: Secondary | ICD-10-CM | POA: Diagnosis not present

## 2022-03-27 DIAGNOSIS — D631 Anemia in chronic kidney disease: Secondary | ICD-10-CM | POA: Diagnosis not present

## 2022-03-27 DIAGNOSIS — N183 Chronic kidney disease, stage 3 unspecified: Secondary | ICD-10-CM | POA: Diagnosis not present

## 2022-03-27 NOTE — Telephone Encounter (Signed)
Called and spoke with pt daug. Vinnie Level who was not around the pt at the time. Stated she will contact CR back at a later time when she is around the pt.

## 2022-03-27 NOTE — Telephone Encounter (Signed)
Pt returned CR phone call and stated he is not interested at this time.   Closed referral

## 2022-03-31 ENCOUNTER — Other Ambulatory Visit: Payer: Self-pay

## 2022-03-31 MED ORDER — CLOPIDOGREL BISULFATE 75 MG PO TABS: 75.0000 mg | ORAL_TABLET | Freq: Every day | ORAL | 2 refills | Status: DC

## 2022-04-01 DIAGNOSIS — Z23 Encounter for immunization: Secondary | ICD-10-CM | POA: Diagnosis not present

## 2022-04-02 MED ORDER — CLOPIDOGREL BISULFATE 75 MG PO TABS: 75.0000 mg | ORAL_TABLET | Freq: Every day | ORAL | 3 refills | Status: AC

## 2022-04-02 NOTE — Addendum Note (Signed)
Addended by: Fidel Levy on: 04/02/2022 11:51 AM   Modules accepted: Orders

## 2022-04-04 ENCOUNTER — Other Ambulatory Visit: Payer: Self-pay | Admitting: Physician Assistant

## 2022-04-09 DIAGNOSIS — H524 Presbyopia: Secondary | ICD-10-CM | POA: Diagnosis not present

## 2022-04-09 DIAGNOSIS — Z961 Presence of intraocular lens: Secondary | ICD-10-CM | POA: Diagnosis not present

## 2022-04-09 DIAGNOSIS — H31002 Unspecified chorioretinal scars, left eye: Secondary | ICD-10-CM | POA: Diagnosis not present

## 2022-05-12 ENCOUNTER — Encounter: Payer: Self-pay | Admitting: Cardiology

## 2022-05-12 ENCOUNTER — Ambulatory Visit: Payer: Medicare Other | Attending: Cardiology | Admitting: Cardiology

## 2022-05-12 VITALS — BP 118/64 | Ht 65.0 in | Wt 188.0 lb

## 2022-05-12 DIAGNOSIS — I255 Ischemic cardiomyopathy: Secondary | ICD-10-CM | POA: Diagnosis not present

## 2022-05-12 DIAGNOSIS — Z955 Presence of coronary angioplasty implant and graft: Secondary | ICD-10-CM | POA: Insufficient documentation

## 2022-05-12 DIAGNOSIS — I214 Non-ST elevation (NSTEMI) myocardial infarction: Secondary | ICD-10-CM | POA: Diagnosis not present

## 2022-05-12 DIAGNOSIS — I2510 Atherosclerotic heart disease of native coronary artery without angina pectoris: Secondary | ICD-10-CM

## 2022-05-12 DIAGNOSIS — I2119 ST elevation (STEMI) myocardial infarction involving other coronary artery of inferior wall: Secondary | ICD-10-CM

## 2022-05-12 DIAGNOSIS — E785 Hyperlipidemia, unspecified: Secondary | ICD-10-CM | POA: Diagnosis not present

## 2022-05-12 DIAGNOSIS — I480 Paroxysmal atrial fibrillation: Secondary | ICD-10-CM | POA: Insufficient documentation

## 2022-05-12 DIAGNOSIS — I5022 Chronic systolic (congestive) heart failure: Secondary | ICD-10-CM | POA: Diagnosis not present

## 2022-05-12 DIAGNOSIS — I10 Essential (primary) hypertension: Secondary | ICD-10-CM | POA: Diagnosis not present

## 2022-05-12 DIAGNOSIS — Z9861 Coronary angioplasty status: Secondary | ICD-10-CM | POA: Insufficient documentation

## 2022-05-12 DIAGNOSIS — Z8673 Personal history of transient ischemic attack (TIA), and cerebral infarction without residual deficits: Secondary | ICD-10-CM | POA: Insufficient documentation

## 2022-05-12 DIAGNOSIS — Z7189 Other specified counseling: Secondary | ICD-10-CM | POA: Insufficient documentation

## 2022-05-12 NOTE — Patient Instructions (Signed)
Medication Instructions:   No changes   Monitor your heart rate  if you notice more irregular ot fast heart rate  contact office will need to stop Asprin and restart  Eliquis    *If you need a refill on your cardiac medications before your next appointment, please call your pharmacy*   Lab Work: Not needed     Testing/Procedures: Not needed   Follow-Up: At [facility], you and your health needs are our priority.  As part of our continuing mission to provide you with exceptional heart care, we have created designated Provider Care Teams.  These Care Teams include your primary Cardiologist (physician) and Advanced Practice Providers (APPs -  Physician Assistants and Nurse Practitioners) who all work together to provide you with the care you need, when you need it.     Your next appointment:   4 month(s)  The format for your next appointment:   In Person  Provider:   Glenetta Hew, MD    Other Instructions   Okay to drive short distances - no more than 35 miles hour

## 2022-05-12 NOTE — Progress Notes (Signed)
Primary Care Provider: Lajean Kramer, [place] Cardiologist: Paul Hew, MD Electrophysiologist: None  Clinic Note: Chief Complaint  Patient presents with   Follow-up    2 months.  Doing well.  No major complaints.   Coronary Artery Disease    No angina or worsening CHF. Doing well post PCI.  No bleeding   Atrial Fibrillation    No recurrent episodes on Kardia-Mobile    ===================================  ASSESSMENT/PLAN   Problem List Items Addressed This Visit       Cardiology Problems   Left main coronary artery disease    Severe left main and bifurcation LAD and LCx disease with known RCA occlusion.  Now status post high risk PCI left main bifurcation leading to his complete revascularization as we can get with the RCA be included.  EF mildly reduced prior to PCI suspect it will improve.  On ASA/Plavix as noted.--I suspect Plavix will be lifelong. On 40 mg atorvastatin and low-dose beta-blocker. Continue to exercise.      Relevant Medications   furosemide (LASIX) 20 MG tablet   STEMI (ST elevation myocardial infarction) (HCC) (Chronic)    He had an inferolateral STEMI back in 2019 with PCI to the LCx and LAD in a staged fashion after he declined referral for CABG. Known RCA occlusion.  Likely culprit was the LCx lesion that was treated with a stent that is still widely patent.  The previously placed LAD stent is also widely patent now has an overlapping stent from the left main into the LAD.      Relevant Medications   furosemide (LASIX) 20 MG tablet   PAF (paroxysmal atrial fibrillation) (HCC); CHA2DS2-VASc Score -7 (Chronic)    His initial episode of A-fib was in the setting of an MI.  He had had no issues until this past summer when he had recurrent A-fib and that led to his CHF development. They have Kardia-Mobile at home and are monitoring his heart rates and rhythms.  So far he has not had any breakthrough spells of A-fib which may be  because of his revascularization.  He is on low-dose beta-blocker for CAD and rate control, but we have opted to use aspirin and Plavix over Eliquis send because the amount of stent in his left coronary system.  Low threshold to cardiovert or consider low-dose amiodarone.      Relevant Medications   furosemide (LASIX) 20 MG tablet   Hyperlipidemia with target low density lipoprotein (LDL) cholesterol less than 70 mg/dL (Chronic)    LDL 55 on current dose of atorvastatin.  No change.  Doing well.  Tolerating well.      Relevant Medications   furosemide (LASIX) 20 MG tablet   Essential hypertension (Chronic)    BP looks good on very low-dose beta-blocker.  I would not adjust further.  Would even potentially allow for mild permissive hypertension to avoid orthostatic hypotension.      Relevant Medications   furosemide (LASIX) 20 MG tablet   NSTEMI (non-ST elevated myocardial infarction) ([place])    Not sure if he truly had a non-STEMI or if it was really heart failure and A-fib leading to demand ischemia.  Patient with severe to have his left main disease, not unexpected that the A-fib RVR and heart failure would have led to demand ischemia mediated troponin elevation.      Relevant Medications   furosemide (LASIX) 20 MG tablet   Chronic HFrEF (heart failure with reduced ejection fraction) (HCC) (Chronic)  Not really reduced EF.  His EF is probably 45 to 50% which I suppose is probably improved now that his left main has been revascularized.  His more HFpEF and I think was probably exacerbated by his A-fib and progression of CAD.  Currently NYHA class I symptoms.  Trivial edema he really only uses his Lasix every now and then after he eats foods that may be the swelling.  Maybe 1 or 2 days a week.  He is on Lopressor 12.5 mg twice daily which is rate control for A-fib but not necessarily on any afterload reduction.  Avoiding ARB/ACE inhibitors or spironolactone because of his renal  insufficiency.  His blood pressures actually looks pretty good on beta-blocker alone.      Relevant Medications   furosemide (LASIX) 20 MG tablet   CAD S/P percutaneous coronary angioplasty - Primary (Chronic)    He now has significant [place] stent in his LM-LAD and ostial LCx as well as more downstream LCx.  Appropriately placed back on DAPT and not on Eliquis based on the fact that he does not have a lot of A-fib and was not really interested in Eliquis in the past.  Would like to be on aspirin and Plavix for at least 3 to 6 months post PCI and then long-term Plavix with or without Eliquis.  On low-dose beta-blocker and high-dose statin.  Would continue uninterrupted aspirin and Plavix for at least 6 months (end of April 2024).  At that time could consider stopping aspirin and potentially holding Plavix if necessary for procedures or surgeries.      Relevant Medications   furosemide (LASIX) 20 MG tablet   Other Relevant Orders   EKG 12-Lead (Completed)     Other   History of stroke   Counseling regarding goals of care    During his hospitalization in August, he indicated he would be DNR which was rescinded for the purposes of the Procedure but now is back in place.  He is very cognizant of the fact that he is 88 years old and has significant disease.  He also feels very good and is wanting to optimize his existing life.  He is not an invalid, and is very mobile and active.  He would like to drive which I think is not unreasonable.  However I do not think that is safe for him to drive on highways or busy roads.  I think back roads to and from the store is acceptable.  But he does not have the reflexes to drive on a major road.  I do not however want to take away his freedom.  I have therefore indicated that is okay for him to drive short distances on back roads, but he should not drive alone.  He also wanted to know if he could go out the every now and then -> he actually change his mind  that he wants to go to [place] for breakfast after clinic.  I indicated that should be fine maybe once a week.  Just watch the salt.      Status post coronary artery stent placement (Chronic)    ===================================  HPI:    Paul Kramer is a 88 y.o. male with a PMH below who presents today for 59-monthfollow-up of, and first post PCI follow-up with me. ->  On his 89th birthday. He follows up here today at the request of Stoneking, Hal, MD.  Multivessel CAD:  Inferolateral STEMI with Minimal Onset A-Fib 11/29/2017: Cath revealed  proximal and mid RCA 80% disease with mid to distal CTO.  Mid to distal LCx 95% thrombotic lesion followed by 55% stenosed OM 2 is the culprit lesion and 85% ostial proximal LAD. => Patient was chest pain-free, EF 45 to 50%. => Plan was for staged PCI of LCx and LAD. 12/07/2017: Staged PCI: Mid distal LCx with 55% OM 2-> scoring balloon angioplasty of OM 2 reducing to 20%, DES PCI with Synergy DES 3.0 x 16 (3.3 mm); proximal LAD DES PCI with Synergy DES 3.0 x 16 (3.60m Echo EF 45 to 50%.  Moderate AI.  Mild to moderate MR.  No ARB/ACE ARB because of renal insufficiency. He done very well up until August 2023. PAF: Initially noted in setting of inferolateral STEMI.  Not on beta-blocker because of bradycardia.  Did not want to be on following coagulation because of issues with falling. HTN, HLD  RJovanni Rashwas seen on July 11 by HCyril Mourningnoted that his heart rate was elevated for up to 10 days.  Heart rates were in the 90s to 100s as opposed to 50s and 60s.  Interestingly, his heart rate was back down to the 50s by the end of the visit.  There was question about him potentially going in and out of A-fib-because of his daughter who is a retired nDesigner, jewellery(and son-in-law is a cardiac anesthesiologist) both noted irregular rhythm on exam.  He had 2+ pitting edema Starting Eliquis 5 mg twice daily,  and started Lasix 20 mg daily.  [facility]  [facility] Zio patch monitor.  2D echo ordered.  He was then seen in follow-up on August 1 by HAlmyra Deforest  Echo reviewed showing EF of 50% with no RWMA.  GR 2 DD.  Moderate LA dilation with mild to moderate MR.  Moderate AI.  Mild ascending aortic dilation.  At this time he is denying any chest pain or dyspnea.  But noted that his heart rate to be elevated for the last several days.  His Kardia-Mobile device indicated that he was back in A-fib, confirmed by EKG in the office with a rate of 109.  No chest pain Converted from amlodipine to 12.5 mg metoprolol twice daily  Recent [facility]:  [facility] on [facility].  Worsening lower EXTR edema.  Noted to have CHF with BNP of 1060 and troponin elevation of 277.  [facility] => Diuresed aggressively with improvement in respiratory status and renal function. Diagnostic catheterization revealed severe disease involving left main, LCx/LAD but with patent previously placed stents.  Not felt to be prudent candidate for CABG in Heart Team Discussion -> options of palliative care versus IVUS PCI discussed.  Based on the amount of symptom that he was having at this time with angina and exertional dyspnea, patient opted for complex high risk PCI to LM-LAD-LCx He did have issues with renal function-creatinine 2.1 at discharge.. Eliquis discontinued on discharge because of rare episodes of PAF in order to allow for DAPT with LM stenting.  He was seen for initial postop follow-up on 03/04/2022 by HAlmyra Deforest PA accompanied by his daughter.  He noted he was feeling well since discharge.  Heart rate was regular.  Denied any further chest pain or worsening dyspnea.  Had some mild abdominal pain on abdominal palpation.  CBC checked to evaluate for bleeding.  Labs checked for CBC and BMP.  Considered  possible abdominal evaluation for RP  bleed but unlikely.  Reviewed  CV studies:    The following studies were reviewed today: (if available, images/films reviewed: From Epic Chart or Care Everywhere) TTE 01/20/2022: Low normal LVEF of roughly 50 to 55%.  GR 2 DD.  Moderate LA dilation.  Mild to moderate MR.  Calcified aortic valve with moderate AI and mild AS.  Mild aortic root dilation and ascending aortic dilation 4 to 3 mm.  TTE 02/07/5725: (Acute Diastolic Heart Failure)-mildly reduced EF of 45 to 50%.  No RWMA.  GR 2 DD with elevated LAP.  Moderate LA dilation.  Mild MR.  Moderate AOV calcification and moderate aortic sclerosis.  Mild to moderate AI.  Ascending aorta measured 40 mm.  Normal RAP.   Cardiac Cath 02/18/2022::.  Severe calcific diffuse left main disease with very severe distal left main stenosis, disease extending into the ostium of the left circumflex and LAD;  Continued patency of the stented segments in the LAD and left circumflex with no high-grade obstruction beyond the complex distal left main lesion  Chronic occlusion of the RCA unchanged from the prior study  Staged PCI 02/21/2022: Mid Left Main-Proximal LAD: Score flex angioplasty followed by shockwave lithotripsySuccessful IVUS guided, CSI atherectomy and Shockwave Lithotripsy assisted Bifurcation Left Main-LAD-LCx DES PCI using 2 stent culotte technique with final kissing balloon (3.25 mm [place] balloons) and POT of LM (4.0 mm [place] balloon): Ostial Left Main-Proximal LCx: CSI atherectomy (4 passes at low speed, distal to proximal) -> Synergy DES 3.0 mm x 38 mm (IVUS guided) -> post elevated to 3.5 mm in the LCx and 4.1 mm in the LM. Mid Left Main-Proximal LAD-(IVUS guided) ScoreFlex atherotomy, followed by Shockwave Lithotripsy - Synergy DES 3.0 mm x 24 mm --> deployed to 3.3 mm in LAD and 4.0 mm in LM.  Dominance: Right      Intervention    Interval History:   Rhydian Baldi presents here today with his daughter in great spirits.  He is his 89th birthday and he  has questions about what he can do when his birthday.  He wanted know if he can go to [employer] for lunch, and then asked the important question about whether or not he can drive.  Because he was told in the hospital he cannot drive.  He is little frustrated today because he says that he cannot ride his bicycle anymore because he pulled a muscle in his leg.  He therefore is only having to walk.  However he walks up to a mile a day without any difficulty.  He has not had any issues at all with chest pain or pressure at rest or exertion.  He actually does want to just walk for exercise, he is very active around the grounds where they live.  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  He says that he really has not felt his heart rate going up.  Is been relatively stable with no episodes of A-fib noted on Kardia-Mobile.  He is extremely happy with how well he feels now.  He was not able to do much at all when he first presented, but now was able to walk around he whenever he wants to do.  Hoping to get back on his bicycle once his leg heals up.  No more PND or orthopnea.  He sleeps at 30 degree elevation more for GERD and comfort than anything else.  Edema well-controlled. No signs or symptoms  to suggest tachycardia and Brantley Persons has indicated that he had not been any. No syncope or near syncope.  No TIA or amaurosis fugax.  No claudication.  CV Review of Symptoms (Summary): no chest pain or dyspnea on exertion negative for - edema, irregular heartbeat, orthopnea, palpitations, paroxysmal nocturnal dyspnea, rapid heart rate, shortness of breath, or syncope or near CP, TIA/amaurosis fugax, claudication  REVIEWED OF SYSTEMS   Review of Systems  Constitutional:  Negative for malaise/fatigue (Energy level doing much better.) and weight loss (He had lost a bit of weight and gained some back has been eating more but since his weights have been stable at home.).  HENT:  Negative for  congestion.   Respiratory:  Negative for cough and shortness of breath.   Cardiovascular:  Positive for leg swelling (Trivial.). Negative for chest pain and claudication.  Gastrointestinal:  Negative for blood in stool, constipation, heartburn and melena.  Genitourinary:  Negative for dysuria and hematuria.  Musculoskeletal:  Negative for falls, joint pain and myalgias.  Neurological:  Negative for dizziness.    I have reviewed and (if needed) personally updated the patient's problem list, medications, allergies, past medical and surgical history, social and family history.   PAST MEDICAL HISTORY   Past Medical History:  Diagnosis Date   First detected episode of atrial fibrillation ([address]) 11/29/2017   In setting of STEMI -- NO further documented episodes   Hypercholesteremia    Hypertension    Multiple vessel coronary artery disease    Cath 11/29/17 - 3 V CAD (CTO RCA), 95% m-d Cx (@ OM2 55%)-> PCI Cx-PTCA Om2; 85% p-m LAD => DES PCI ; 01/2022 -> LM<LAD-LCx 80%-80% - 95%, with patent LAD and LCx stents. => Staged atherectomy-shockwave bifurcation LM-LAD-LCx PCI.   Myocardial infarction of inferolateral wall ([place]) 11/2017   ? prior Infarct; - Cath 11/29/17 - 3 V CAD (CTO RCA), 95% m-d Cx (@ OM2 55%)-> PCI Cx-PTCA Om2; 85% p-m LAD => DES PCI    Narcolepsy    pt may pass out when laughing   Spinal stenosis    TIA (transient ischemic attack)     PAST SURGICAL HISTORY   Past Surgical History:  Procedure Laterality Date   ARTERIAL LINE INSERTION N/A 02/21/2022   Procedure: ARTERIAL LINE INSERTION;  Surgeon: Leonie Man, MD;  Location: [facility];  Service: Cardiovascular;  Laterality: N/A;   CENTRAL LINE INSERTION  02/21/2022   Procedure: CENTRAL LINE INSERTION;  Surgeon: Leonie Man, MD;  Location: [facility];  Service: Cardiovascular;;   CORONARY ATHERECTOMY N/A 02/21/2022   Procedure: CORONARY ATHERECTOMY;  Surgeon: Leonie Man, MD;  Location: [facility];  Service: Cardiovascular;: Atherectomy mid LM into proximal LCx followed by bifurcation stenting   CORONARY BALLOON ANGIOPLASTY N/A 11/29/2017   Procedure: CORONARY BALLOON ANGIOPLASTY;  Surgeon: Leonie Man, MD;  Location: [facility];  Service: Cardiovascular;;  - PTCA of RCA - unable to cross -- LIKELY CTO.   CORONARY LITHOTRIPSY N/A 02/21/2022   Procedure: CORONARY LITHOTRIPSY;  Surgeon: Leonie Man, MD;  Location: [facility];  Service: Cardiovascular;: Ostial LAD   CORONARY STENT INTERVENTION N/A 12/07/2017   Procedure: CORONARY STENT INTERVENTION;  Surgeon: Leonie Man, MD;  Location: [facility];  Service: Cardiovascular; Mid-dist LCx 95% with 55% OM2 => Scoring PTCA of OM2 (20%), LCx PCI - Synergy DES 3 x 16 (3.3 mm); prox LAD 85% PCI- Synergy DES 3  x 16 (3.6 mm).   CORONARY STENT INTERVENTION N/A 02/21/2022   Procedure: CORONARY STENT INTERVENTION;  Surgeon: Leonie Man, MD;  Location: [facility];  Service:CV:: mLM-pLCx IVUS -Guided CSI Atherectomy & LM-LAD Scoring Balloon/Shockwave Lithotripsy & Bifurcation PCI ([place]): ostLM-pLCX: Syncergy DES 3.0 x 38 mm (Post-dil 4.42m -LM & 3.556mLCx, mLM-pLAD (overlapping prior LAD stent): Synergy DES 3.0 x 24 mm  (post-dil: 3.3 LAD, 4.1 LM)   LEFT HEART CATH N/A 12/07/2017   Procedure: Left Heart Cath;  Surgeon: HaLeonie ManMD;  [facility];  Service: Cardiovascular;  Laterality: N/A;   LEFT HEART CATH AND CORONARY ANGIOGRAPHY N/A 11/29/2017   Procedure: LEFT HEART CATH AND CORONARY ANGIOGRAPHY;  Surgeon: HaLeonie ManMD;  [facility];; mRCA CTO, p-m LAD 85%, m-dCx 95% @ OM2 55%. Mod High LVEDP    LEFT HEART CATH AND CORONARY ANGIOGRAPHY N/A 02/18/2022   Procedure: LEFT HEART CATH AND CORONARY ANGIOGRAPHY;  Surgeon: CoSherren MochaMD;  [facility];  Service: CV:: Severe calcific diffusely LM disease with very severe calcified  disease (95%) LCx and 80% LM and LAD.  Patent LAD and LCx stents.  Known RCA CTO.  No downstream disease of more than 20 to 30%.   LIPOMA EXCISION     SPINAL FIXATION SURGERY     TRANSTHORACIC ECHOCARDIOGRAM  11/30/2017   EF 45-50%.  Inferior hypokinesis.  Elevated filling pressures.  Mild to moderate MR.      There is no immunization history on file for this patient.  MEDICATIONS/ALLERGIES   Current Meds  Medication Sig   acetaminophen (TYLENOL) 500 MG tablet Take 500 mg by mouth every 6 (six) hours as needed for mild pain or headache.   allopurinol (ZYLOPRIM) 100 MG tablet Take 100 mg by mouth daily.   aspirin EC 81 MG tablet Take 1 tablet (81 mg total) by mouth daily. Swallow whole.   atorvastatin (LIPITOR) 40 MG tablet TAKE 1 TABLET DAILY AT 6 P.M. (Patient taking differently: Take 40 mg by mouth every evening.)   Cholecalciferol (VITAMIN D) 50 MCG (2000 UT) CAPS Take 2,000 Units by mouth daily.   clopidogrel (PLAVIX) 75 MG tablet Take 1 tablet (75 mg total) by mouth daily.   Cyanocobalamin (VITAMIN B-12) 5000 MCG TBDP Take 5,000 mcg by mouth daily.   fluticasone (FLONASE) 50 MCG/ACT nasal spray Place 1 spray into both nostrils daily.   furosemide (LASIX) 20 MG tablet Take 20 mg by mouth as needed.   Histamine Dihydrochloride (AUSTRALIAN DREAM ARTHRITIS) 0.025 % CREA Apply 1 Application topically daily as needed (knee pain).   metoprolol tartrate (LOPRESSOR) 25 MG tablet Take 0.5 tablets (12.5 mg total) by mouth 2 (two) times daily.   Multiple Vitamins-Minerals (CENTRUM SILVER PO) Take 1 tablet by mouth daily.   nitroGLYCERIN (NITROSTAT) 0.4 MG SL tablet DISSOLVE ONE TABLET UNDER THE TONGUE EVERY 5 MINUTES AS NEEDED FOR CHEST PAIN.  DO NOT EXCEED A TOTAL OF 3 DOSES IN 15 MINUTES (Patient taking differently: Place 0.4 mg under the tongue every 5 (five) minutes x 3 doses as needed for chest pain.)   terazosin (HYTRIN) 2 MG capsule Take 4 mg by mouth every evening.    Allergies   Allergen Reactions   Other Shortness Of Breath    Esters   Robitussin Dm Max Day-Night Anaphylaxis and Swelling    Throat swelling   Banana Other (See Comments)    Severe indigestion   Contrast  Media [Iodinated Contrast Media] Other (See Comments)    Just feel bad all over   Morphine And Related Other (See Comments)    Patient states he does not like to take Morphine as it gives him nightmares and hallucinations.     SOCIAL HISTORY/FAMILY HISTORY   Reviewed in Epic:  Pertinent findings:  Social History   Tobacco Use   Smoking status: Former   Smokeless tobacco: Never  Substance Use Topics   Alcohol use: Yes    Comment: rare   Drug use: Never   Social History   Social History Narrative   Not on file    OBJCTIVE -PE, EKG, labs   Wt Readings from Last 3 Encounters:  05/12/22 188 lb (85.3 kg)  03/04/22 183 lb 6.4 oz (83.2 kg)  02/23/22 188 lb 1.6 oz (85.3 kg)    Physical Exam: BP 118/64 (BP Location: Left Arm, Patient Position: Sitting, Cuff Size: Normal)   Ht '5\' 5"'$  (1.651 m)   Wt 188 lb (85.3 kg)   SpO2 99%   BMI 31.28 kg/m  Physical Exam Vitals reviewed.  Constitutional:      General: He is not in acute distress.    Appearance: Normal appearance. He is obese. He is not ill-appearing or toxic-appearing.     Comments: Looks remarkably well for his age.  In great spirits.  Smiling.  HENT:     Head: Normocephalic and atraumatic.  Neck:     Vascular: No carotid bruit (Bilateral radiated aortic murmur.) or JVD.  Cardiovascular:     Rate and Rhythm: Regular rhythm. Bradycardia present. No extrasystoles are present.    Chest Wall: PMI is not displaced.     Pulses: Normal pulses.     Heart sounds: Murmur heard.     High-pitched harsh crescendo-decrescendo midsystolic murmur is present with a grade of 2/6 at the upper right sternal border radiating to the neck.     High-pitched blowing holosystolic murmur of grade 1/6 is also present at the apex.      High-pitched blowing decrescendo early diastolic murmur is present with a grade of 1/4 at the upper right sternal border radiating to the apex.     Friction rub present. No gallop.  Pulmonary:     Effort: Pulmonary effort is normal. No respiratory distress.     Breath sounds: Normal breath sounds. No wheezing or rales.  Chest:     Chest wall: No tenderness.  Musculoskeletal:        General: Swelling present. Normal range of motion.     Cervical back: Normal range of motion and neck supple.  Skin:    General: Skin is warm and dry.  Neurological:     General: No focal deficit present.     Mental Status: He is alert and oriented to person, place, and time.     Gait: Gait abnormal (Limping a little bit because of his pulled muscle).  Psychiatric:        Mood and Affect: Mood normal.        Behavior: Behavior normal.        Thought Content: Thought content normal.        Judgment: Judgment normal.     Adult ECG Report  Rate: 55 ;  Rhythm: sinus bradycardia and otherwise normal axis, intervals and durations. ;   Narrative Interpretation: Normal  Recent Labs: Reviewed Lab Results  Component Value Date   CHOL 110 02/16/2022   HDL 43 02/16/2022   LDLCALC 55  02/16/2022   TRIG 60 02/16/2022   CHOLHDL 2.6 02/16/2022   Lab Results  Component Value Date   CREATININE 1.68 (H) 03/04/2022   BUN 34 (H) 03/04/2022   NA 141 03/04/2022   K 5.1 03/04/2022   CL 106 03/04/2022   CO2 19 (L) 03/04/2022      Latest Ref Rng & Units 03/04/2022   10:41 AM 02/23/2022   12:04 AM 02/22/2022   12:36 AM  CBC  WBC 3.4 - 10.8 x10E3/uL 7.1  9.9  9.3   Hemoglobin 13.0 - 17.7 g/dL 11.8  10.2  10.6   Hematocrit 37.5 - 51.0 % 36.1  29.6  31.1   Platelets 150 - 450 x10E3/uL 236  167  174     Lab Results  Component Value Date   HGBA1C 5.6 02/16/2022   Lab Results  Component Value Date   TSH 5.579 (H) 02/16/2022    ================================================== I spent a total of 30 minutes  with the patient spent in direct patient consultation.  Additional time spent with chart review  / charting (studies, outside notes, etc): 46 min Extensive chart review.  Multiple clinic visits reviewed along with hospitalization and procedures. Total Time: 76 min  Current medicines are reviewed at length with the patient today.  (+/- concerns) none  Notice: This dictation was prepared with Dragon dictation along with smart phrase technology. Any transcriptional errors that result from this process are unintentional and may not be corrected upon review.  Studies Ordered:   Orders Placed This Encounter  Procedures   EKG 12-Lead   No orders of the defined types were placed in this encounter.   Patient Instructions / Medication Changes & Studies & Tests Ordered   Patient Instructions  Medication Instructions:   No changes   Monitor your heart rate  if you notice more irregular ot fast heart rate  contact office will need to stop Asprin and restart  Eliquis    *If you need a refill on your cardiac medications before your next appointment, please call your pharmacy*   Lab Work: Not needed     Testing/Procedures: Not needed   Follow-Up: At [facility], you and your health needs are our priority.  As part of our continuing mission to provide you with exceptional heart care, we have created designated Provider Care Teams.  These Care Teams include your primary Cardiologist (physician) and Advanced Practice Providers (APPs -  Physician Assistants and Nurse Practitioners) who all work together to provide you with the care you need, when you need it.     Your next appointment:   4 month(s)  The format for your next appointment:   In Person  Provider:   Glenetta Hew, MD    Other Instructions   Okay to drive short distances - no more than 35 miles hour     Leonie Man, MD, MS Paul Kramer, M.D., M.S. Interventional Cardiologist  [facility]   Pager # [phone] Phone # [phone] [address] [place], [place] [address]   Thank you for choosing [facility]!!

## 2022-05-19 ENCOUNTER — Encounter: Payer: Self-pay | Admitting: Cardiology

## 2022-05-21 DIAGNOSIS — M5451 Vertebrogenic low back pain: Secondary | ICD-10-CM | POA: Diagnosis not present

## 2022-05-21 DIAGNOSIS — M545 Low back pain, unspecified: Secondary | ICD-10-CM | POA: Diagnosis not present

## 2022-05-21 DIAGNOSIS — M170 Bilateral primary osteoarthritis of knee: Secondary | ICD-10-CM | POA: Diagnosis not present

## 2022-05-21 DIAGNOSIS — M1711 Unilateral primary osteoarthritis, right knee: Secondary | ICD-10-CM | POA: Diagnosis not present

## 2022-05-28 DIAGNOSIS — M170 Bilateral primary osteoarthritis of knee: Secondary | ICD-10-CM | POA: Diagnosis not present

## 2022-06-04 DIAGNOSIS — M170 Bilateral primary osteoarthritis of knee: Secondary | ICD-10-CM | POA: Diagnosis not present

## 2022-06-07 ENCOUNTER — Encounter: Payer: Self-pay | Admitting: Cardiology

## 2022-06-07 DIAGNOSIS — Z7189 Other specified counseling: Secondary | ICD-10-CM | POA: Insufficient documentation

## 2022-06-07 NOTE — Assessment & Plan Note (Signed)
His initial episode of A-fib was in the setting of an MI.  He had had no issues until this past summer when he had recurrent A-fib and that led to his CHF development. They have [employer] at home and are monitoring his heart rates and rhythms.  So far he has not had any breakthrough spells of A-fib which may be because of his revascularization.  He is on low-dose beta-blocker for CAD and rate control, but we have opted to use aspirin and Plavix over Eliquis send because the amount of stent in his left coronary system.  Low threshold to cardiovert or consider low-dose amiodarone.

## 2022-06-07 NOTE — Assessment & Plan Note (Signed)
He had an inferolateral STEMI back in 2019 with PCI to the LCx and LAD in a staged fashion after he declined referral for CABG. Known RCA occlusion.  Likely culprit was the LCx lesion that was treated with a stent that is still widely patent.  The previously placed LAD stent is also widely patent now has an overlapping stent from the left main into the LAD.

## 2022-06-07 NOTE — Assessment & Plan Note (Signed)
LDL 55 on current dose of atorvastatin.  No change.  Doing well.  Tolerating well.

## 2022-06-07 NOTE — Assessment & Plan Note (Signed)
During his hospitalization in August, he indicated he would be DNR which was rescinded for the purposes of the Procedure but now is back in place.  He is very cognizant of the fact that he is 88 years old and has significant disease.  He also feels very good and is wanting to optimize his existing life.  He is not an invalid, and is very mobile and active.  He would like to drive which I think is not unreasonable.  However I do not think that is safe for him to drive on highways or busy roads.  I think back roads to and from the store is acceptable.  But he does not have the reflexes to drive on a major road.  I do not however want to take away his freedom.  I have therefore indicated that is okay for him to drive short distances on back roads, but he should not drive alone.  He also wanted to know if he could go out the every now and then -> he actually change his mind that he wants to go to [place] for breakfast after clinic.  I indicated that should be fine maybe once a week.  Just watch the salt.

## 2022-06-07 NOTE — Assessment & Plan Note (Signed)
Not really reduced EF.  His EF is probably 45 to 50% which I suppose is probably improved now that his left main has been revascularized.  His more HFpEF and I think was probably exacerbated by his A-fib and progression of CAD.  Currently NYHA class I symptoms.  Trivial edema he really only uses his Lasix every now and then after he eats foods that may be the swelling.  Maybe 1 or 2 days a week.  He is on Lopressor 12.5 mg twice daily which is rate control for A-fib but not necessarily on any afterload reduction.  Avoiding ARB/ACE inhibitors or spironolactone because of his renal insufficiency.  His blood pressures actually looks pretty good on beta-blocker alone.

## 2022-06-07 NOTE — Assessment & Plan Note (Signed)
Not sure if he truly had a non-STEMI or if it was really heart failure and A-fib leading to demand ischemia.  Patient with severe to have his left main disease, not unexpected that the A-fib RVR and heart failure would have led to demand ischemia mediated troponin elevation.

## 2022-06-07 NOTE — Assessment & Plan Note (Signed)
BP looks good on very low-dose beta-blocker.  I would not adjust further.  Would even potentially allow for mild permissive hypertension to avoid orthostatic hypotension.

## 2022-06-07 NOTE — Assessment & Plan Note (Signed)
Severe left main and bifurcation LAD and LCx disease with known RCA occlusion.  Now status post high risk PCI left main bifurcation leading to his complete revascularization as we can get with the RCA be included.  EF mildly reduced prior to PCI suspect it will improve.  On ASA/Plavix as noted.--I suspect Plavix will be lifelong. On 40 mg atorvastatin and low-dose beta-blocker. Continue to exercise.

## 2022-06-07 NOTE — Assessment & Plan Note (Signed)
He now has significant [place] stent in his LM-LAD and ostial LCx as well as more downstream LCx.  Appropriately placed back on DAPT and not on Eliquis based on the fact that he does not have a lot of A-fib and was not really interested in Eliquis in the past.  Would like to be on aspirin and Plavix for at least 3 to 6 months post PCI and then long-term Plavix with or without Eliquis.  On low-dose beta-blocker and high-dose statin.  Would continue uninterrupted aspirin and Plavix for at least 6 months (end of April 2024).  At that time could consider stopping aspirin and potentially holding Plavix if necessary for procedures or surgeries.

## 2022-06-09 DIAGNOSIS — M545 Low back pain, unspecified: Secondary | ICD-10-CM | POA: Diagnosis not present

## 2022-06-11 DIAGNOSIS — L853 Xerosis cutis: Secondary | ICD-10-CM | POA: Diagnosis not present

## 2022-06-11 DIAGNOSIS — M170 Bilateral primary osteoarthritis of knee: Secondary | ICD-10-CM | POA: Diagnosis not present

## 2022-06-13 DIAGNOSIS — M545 Low back pain, unspecified: Secondary | ICD-10-CM | POA: Diagnosis not present

## 2022-06-16 ENCOUNTER — Encounter: Payer: Self-pay | Admitting: Cardiology

## 2022-06-16 DIAGNOSIS — N189 Chronic kidney disease, unspecified: Secondary | ICD-10-CM | POA: Diagnosis not present

## 2022-06-16 DIAGNOSIS — I129 Hypertensive chronic kidney disease with stage 1 through stage 4 chronic kidney disease, or unspecified chronic kidney disease: Secondary | ICD-10-CM | POA: Diagnosis not present

## 2022-06-16 DIAGNOSIS — N183 Chronic kidney disease, stage 3 unspecified: Secondary | ICD-10-CM | POA: Diagnosis not present

## 2022-06-16 DIAGNOSIS — N2581 Secondary hyperparathyroidism of renal origin: Secondary | ICD-10-CM | POA: Diagnosis not present

## 2022-06-16 DIAGNOSIS — M545 Low back pain, unspecified: Secondary | ICD-10-CM | POA: Diagnosis not present

## 2022-06-16 DIAGNOSIS — D631 Anemia in chronic kidney disease: Secondary | ICD-10-CM | POA: Diagnosis not present

## 2022-06-18 DIAGNOSIS — M545 Low back pain, unspecified: Secondary | ICD-10-CM | POA: Diagnosis not present

## 2022-06-24 DIAGNOSIS — M545 Low back pain, unspecified: Secondary | ICD-10-CM | POA: Diagnosis not present

## 2022-06-26 DIAGNOSIS — M545 Low back pain, unspecified: Secondary | ICD-10-CM | POA: Diagnosis not present

## 2022-07-01 DIAGNOSIS — M545 Low back pain, unspecified: Secondary | ICD-10-CM | POA: Diagnosis not present

## 2022-07-18 DIAGNOSIS — R55 Syncope and collapse: Secondary | ICD-10-CM | POA: Diagnosis not present

## 2022-07-18 DIAGNOSIS — L309 Dermatitis, unspecified: Secondary | ICD-10-CM | POA: Diagnosis not present

## 2022-07-22 ENCOUNTER — Ambulatory Visit (INDEPENDENT_AMBULATORY_CARE_PROVIDER_SITE_OTHER): Payer: Medicare Other | Admitting: Cardiology

## 2022-07-22 ENCOUNTER — Encounter: Payer: Self-pay | Admitting: Cardiology

## 2022-07-22 VITALS — BP 110/68 | HR 73 | Ht 65.0 in | Wt 188.6 lb

## 2022-07-22 DIAGNOSIS — I200 Unstable angina: Secondary | ICD-10-CM | POA: Diagnosis not present

## 2022-07-22 DIAGNOSIS — Z7901 Long term (current) use of anticoagulants: Secondary | ICD-10-CM | POA: Diagnosis not present

## 2022-07-22 DIAGNOSIS — N1832 Chronic kidney disease, stage 3b: Secondary | ICD-10-CM | POA: Diagnosis not present

## 2022-07-22 DIAGNOSIS — Z7982 Long term (current) use of aspirin: Secondary | ICD-10-CM | POA: Diagnosis not present

## 2022-07-22 DIAGNOSIS — T82855A Stenosis of coronary artery stent, initial encounter: Secondary | ICD-10-CM | POA: Diagnosis present

## 2022-07-22 DIAGNOSIS — D649 Anemia, unspecified: Secondary | ICD-10-CM | POA: Diagnosis not present

## 2022-07-22 DIAGNOSIS — Z66 Do not resuscitate: Secondary | ICD-10-CM | POA: Diagnosis not present

## 2022-07-22 DIAGNOSIS — R918 Other nonspecific abnormal finding of lung field: Secondary | ICD-10-CM | POA: Diagnosis not present

## 2022-07-22 DIAGNOSIS — I5043 Acute on chronic combined systolic (congestive) and diastolic (congestive) heart failure: Secondary | ICD-10-CM | POA: Diagnosis not present

## 2022-07-22 DIAGNOSIS — Z452 Encounter for adjustment and management of vascular access device: Secondary | ICD-10-CM | POA: Diagnosis not present

## 2022-07-22 DIAGNOSIS — E871 Hypo-osmolality and hyponatremia: Secondary | ICD-10-CM | POA: Diagnosis not present

## 2022-07-22 DIAGNOSIS — J811 Chronic pulmonary edema: Secondary | ICD-10-CM | POA: Diagnosis not present

## 2022-07-22 DIAGNOSIS — N179 Acute kidney failure, unspecified: Secondary | ICD-10-CM | POA: Diagnosis not present

## 2022-07-22 DIAGNOSIS — I5022 Chronic systolic (congestive) heart failure: Secondary | ICD-10-CM | POA: Insufficient documentation

## 2022-07-22 DIAGNOSIS — R570 Cardiogenic shock: Secondary | ICD-10-CM | POA: Diagnosis not present

## 2022-07-22 DIAGNOSIS — E7800 Pure hypercholesterolemia, unspecified: Secondary | ICD-10-CM | POA: Diagnosis present

## 2022-07-22 DIAGNOSIS — J984 Other disorders of lung: Secondary | ICD-10-CM | POA: Diagnosis not present

## 2022-07-22 DIAGNOSIS — I130 Hypertensive heart and chronic kidney disease with heart failure and stage 1 through stage 4 chronic kidney disease, or unspecified chronic kidney disease: Secondary | ICD-10-CM | POA: Diagnosis not present

## 2022-07-22 DIAGNOSIS — Z8673 Personal history of transient ischemic attack (TIA), and cerebral infarction without residual deficits: Secondary | ICD-10-CM | POA: Diagnosis not present

## 2022-07-22 DIAGNOSIS — Y831 Surgical operation with implant of artificial internal device as the cause of abnormal reaction of the patient, or of later complication, without mention of misadventure at the time of the procedure: Secondary | ICD-10-CM | POA: Diagnosis present

## 2022-07-22 DIAGNOSIS — E872 Acidosis, unspecified: Secondary | ICD-10-CM | POA: Diagnosis not present

## 2022-07-22 DIAGNOSIS — I480 Paroxysmal atrial fibrillation: Secondary | ICD-10-CM | POA: Diagnosis present

## 2022-07-22 DIAGNOSIS — I252 Old myocardial infarction: Secondary | ICD-10-CM | POA: Diagnosis not present

## 2022-07-22 DIAGNOSIS — I5023 Acute on chronic systolic (congestive) heart failure: Secondary | ICD-10-CM | POA: Diagnosis not present

## 2022-07-22 DIAGNOSIS — Z7902 Long term (current) use of antithrombotics/antiplatelets: Secondary | ICD-10-CM | POA: Diagnosis not present

## 2022-07-22 DIAGNOSIS — Z515 Encounter for palliative care: Secondary | ICD-10-CM | POA: Diagnosis not present

## 2022-07-22 DIAGNOSIS — I2510 Atherosclerotic heart disease of native coronary artery without angina pectoris: Secondary | ICD-10-CM | POA: Insufficient documentation

## 2022-07-22 DIAGNOSIS — Z87891 Personal history of nicotine dependence: Secondary | ICD-10-CM | POA: Diagnosis not present

## 2022-07-22 DIAGNOSIS — I255 Ischemic cardiomyopathy: Secondary | ICD-10-CM | POA: Diagnosis present

## 2022-07-22 DIAGNOSIS — N281 Cyst of kidney, acquired: Secondary | ICD-10-CM | POA: Diagnosis not present

## 2022-07-22 DIAGNOSIS — I272 Pulmonary hypertension, unspecified: Secondary | ICD-10-CM | POA: Diagnosis not present

## 2022-07-22 DIAGNOSIS — J9601 Acute respiratory failure with hypoxia: Secondary | ICD-10-CM | POA: Diagnosis not present

## 2022-07-22 DIAGNOSIS — Z9861 Coronary angioplasty status: Secondary | ICD-10-CM | POA: Insufficient documentation

## 2022-07-22 DIAGNOSIS — I25110 Atherosclerotic heart disease of native coronary artery with unstable angina pectoris: Secondary | ICD-10-CM | POA: Diagnosis not present

## 2022-07-22 DIAGNOSIS — N184 Chronic kidney disease, stage 4 (severe): Secondary | ICD-10-CM | POA: Diagnosis not present

## 2022-07-22 DIAGNOSIS — Z79899 Other long term (current) drug therapy: Secondary | ICD-10-CM | POA: Diagnosis not present

## 2022-07-22 MED ORDER — PREDNISONE 50 MG PO TABS: ORAL_TABLET | ORAL | 0 refills | Status: AC

## 2022-07-22 NOTE — Progress Notes (Signed)
ATTENDING ATTESTATION  I have seen, examined and evaluated the patient along with the Resident Physician in clinic today.  I personally performed my own interview & exanimation.  After reviewing all the available data and chart, we discussed the patients laboratory, study & physical findings as well as symptoms in detail. I agree with his findings, examination as well as impression recommendations as per our discussion.   See full note for details.  Attending adjustments int the full clinic noted annotated in [place].    Basic impression recommendations presenting with unstable/progressive angina to monitor his pre-PCI symptoms. Will plan for cardiac catheterization July 25, 2022.   Glenetta Hew, MD

## 2022-07-22 NOTE — Assessment & Plan Note (Signed)
Progressive exertional dyspnea and chest congestion but no real PND orthopnea.  Trivial edema.  Symptoms are relieved with low-dose furosemide.  Okay to take additional PRN dosing.  He does not have a lot of blood pressure and we have not been able to add afterload reduction.  Will need to reassess following catheterization especially depending on LVEDP.

## 2022-07-22 NOTE — H&P (View-Only) (Signed)
ATTENDING ATTESTATION  I have seen, examined and evaluated the patient along with the Resident Physician in clinic today.  I personally performed my own interview & exanimation.  After reviewing all the available data and chart, we discussed the patients laboratory, study & physical findings as well as symptoms in detail. I agree with his findings, examination as well as impression recommendations as per our discussion.   See full note for details.  Attending adjustments int the full clinic noted annotated in [place].    Basic impression recommendations presenting with unstable/progressive angina to monitor his pre-PCI symptoms. Will plan for cardiac catheterization July 25, 2022.   Glenetta Hew, MD

## 2022-07-22 NOTE — Assessment & Plan Note (Signed)
S/p PCI of the left main bifurcation in 01/2022 with complete revascularization. Also with known RCA occlusion. Now having chest pain/back pain, DOE, and exercise intolerance. New ST depression in the lateral leads on EKG in the office today. Concern for possible stent failure/restenosis versus new cardiac ischemic event.  Remains on DAPT at this time.  - Will schedule for cath lab on Friday - Continue Lopressor 12.'5mg'$  BID, hold for systolic <916 - Continue atorvastatin '40mg'$  daily  - Continue ASA and Plavix - Advised to seek emergent care if he develops symptoms at rest  Dr. Joelyn Oms.   Patient personally interviewed and examined along with Dr. Joelyn Oms.  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  I am concerned about potential left main stent issues.  He is definitely slowing down and is clearly symptomatic.  I think that the best course of action will be to proceed with urgent catheterization.  It was scheduled for Friday. See informed consent decision making.  He has not required any nitroglycerin, but I did recommend that he take nitroglycerin if necessary.  If he has resting symptoms he needs to go directly to the emergency room. He is not on beta-blocker besides low-dose metoprolol and has not had blood pressure room for other antianginals.  Plan: Posted for cardiac catheterization with possible PCI on 07/16/2022 Continue beta-blocker, statin and DAPT.

## 2022-07-22 NOTE — Patient Instructions (Addendum)
Medication Instructions:   Do not take Metoprolol if your blood pressure ( systolic) less than 008  *If you need a refill on your cardiac medications before your next appointment, please call your pharmacy*   Lab Work: BMP today If you have labs (blood work) drawn today and your tests are completely normal, you will receive your results only by: [place] (if you have MyChart) OR A paper copy in the mail If you have any lab test that is abnormal or we need to change your treatment, we will call you to review the results.   Testing/Procedures: Your physician has requested that you have a cardiac catheterization. Cardiac catheterization is used to diagnose and/or treat various heart conditions. Doctors may recommend this procedure for a number of different reasons. The most common reason is to evaluate chest pain. Chest pain can be a symptom of coronary artery disease (CAD), and cardiac catheterization can show whether plaque is narrowing or blocking your heart's arteries. This procedure is also used to evaluate the valves, as well as measure the blood flow and oxygen levels in different parts of your heart. Please follow instruction sheet, as given.   Follow-Up: At [facility], you and your health needs are our priority.  As part of our continuing mission to provide you with exceptional heart care, we have created designated Provider Care Teams.  These Care Teams include your primary Cardiologist (physician) and Advanced Practice Providers (APPs -  Physician Assistants and Nurse Practitioners) who all work together to provide you with the care you need, when you need it.     Your next appointment:   2 week(s)  The format for your next appointment:   In Person  Provider:     Almyra Deforest PA, , Diona Browner PA  Other Instructions  .  [facility] [id number] [place] [address] Dept: [phone] Loc: [place]  07/22/2022  You are scheduled for a Cardiac Catheterization on Friday, January 26 with Dr. Glenetta Hew.  1. Please arrive at the [facility] (Main Entrance A) at [facility]: [address] [place], [place] [address] at 10:00 AM (This time is two hours before your procedure to ensure your preparation). Free valet parking service is available.   Special note: Every effort is made to have your procedure done on time. Please understand that emergencies sometimes delay scheduled procedures.  2. Diet: Do not eat solid foods after midnight.  The patient may have clear liquids until 5am upon the day of the procedure.  3. Labs: You will need to have blood drawn on  today - BMP, CBC,   at . You do not need to be fasting.  4. Medication instructions in preparation for your procedure:   Contrast Allergy: Yes, Please take Prednisone '50mg'$  by mouth at: Thirteen hours prior to cath 11:00pm on Thursday Seven hours prior to cath 5:00am on Friday And prior to leaving home please take last dose of Prednisone '50mg'$  and Benadryl '50mg'$  by mouth. 10:am    On the morning of your procedure, take your Aspirin 81 mg and Plavix/Clopidogrel 75 mg and any morning medicines NOT listed above.  You may use sips of water.  5. Plan for one night stay--bring personal belongings. 6. Bring a current list of your medications and current insurance cards. 7. You MUST have a responsible person to drive you  home. 8. Someone MUST be with you the first 24 hours after you arrive home or your discharge will be delayed. 9. Please wear clothes that are easy to get on and off and wear slip-on shoes.  Thank you for allowing [place] to care for you!   -- [facility]

## 2022-07-22 NOTE — Telephone Encounter (Signed)
Daughter stated she will bring patient today for appointment with Dr. Ellyn Hack.

## 2022-07-22 NOTE — Assessment & Plan Note (Signed)
Successful bifurcation PCI back in August 2023 with dramatic improvement of symptoms.  Unfortunately now he is having progression of anginal symptoms and exertional intolerance.  Symptoms very concerning for unstable angina.  He is on stable dose of statin, low-dose beta-blocker and DAPT, however symptoms progressed.  Concern for in-stent restenosis.  Plan: Cardiac catheterization PCI on July 25, 2022

## 2022-07-22 NOTE — Progress Notes (Signed)
Primary Care Provider: Lajean Manes, [place] Cardiologist: Glenetta Hew, MD Electrophysiologist: None  Clinic Note: Chief Complaint  Patient presents with   Chest Pain    & SOB with exertion -- "going downhil"   Coronary Artery Disease    Symptoms concerning for recurrent angina   ===================================  ASSESSMENT/PLAN   Problem List Items Addressed This Visit       Cardiology Problems   Left main coronary artery disease (Chronic)    Successful bifurcation PCI back in August 2023 with dramatic improvement of symptoms.  Unfortunately now he is having progression of anginal symptoms and exertional intolerance.  Symptoms very concerning for unstable angina.  He is on stable dose of statin, low-dose beta-blocker and DAPT, however symptoms progressed.  Concern for in-stent restenosis.  Plan: Cardiac catheterization PCI on July 25, 2022      Relevant Orders   EKG 12-Lead (Completed)   Basic metabolic panel   Unstable angina ([place]) - Primary    S/p PCI of the left main bifurcation in 01/2022 with complete revascularization. Also with known RCA occlusion. Now having chest pain/back pain, DOE, and exercise intolerance. New ST depression in the lateral leads on EKG in the office today. Concern for possible stent failure/restenosis versus new cardiac ischemic event.  Remains on DAPT at this time.  - Will schedule for cath lab on Friday - Continue Lopressor 12.'5mg'$  BID, hold for systolic <580 - Continue atorvastatin '40mg'$  daily  - Continue ASA and Plavix - Advised to seek emergent care if he develops symptoms at rest  Dr. Joelyn Oms.   Patient personally interviewed and examined along with Dr. Joelyn Oms.  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  I am concerned about potential left main stent issues.  He is definitely slowing down and is clearly symptomatic.  I think that the best course of action will be to proceed with  urgent catheterization.  It was scheduled for Friday. See informed consent decision making.  He has not required any nitroglycerin, but I did recommend that he take nitroglycerin if necessary.  If he has resting symptoms he needs to go directly to the emergency room. He is not on beta-blocker besides low-dose metoprolol and has not had blood pressure room for other antianginals.  Plan: Posted for cardiac catheterization with possible PCI on 07/14/2022 Continue beta-blocker, statin and DAPT.      Relevant Orders   EKG 12-Lead (Completed)   Basic metabolic panel   Chronic HFrEF (heart failure with reduced ejection fraction) (H[facility]) (Chronic)    Progressive exertional dyspnea and chest congestion but no real PND orthopnea.  Trivial edema.  Symptoms are relieved with low-dose furosemide.  Okay to take additional PRN dosing.  He does not have a lot of blood pressure and we have not been able to add afterload reduction.  Will need to reassess following catheterization especially depending on LVEDP.      Relevant Orders   EKG 12-Lead (Completed)   Basic metabolic panel   CAD S/P percutaneous coronary angioplasty (Chronic)   Relevant Orders   EKG 12-Lead (Completed)   Basic metabolic panel   ===================================  HPI:    Paul Kramer is a 89 y.o. male with a PMH below who presents today for follow-up evaluation of New Onset Exertional Chest Pain and Shortness of Breath with Chest Congestion and Dyspnea on Exertion.  Multivessel CAD:  Inferolateral STEMI with Minimal Onset A-Fib 11/29/2017: Cath revealed proximal and mid RCA 80%  disease with mid to distal CTO.  Mid to distal LCx 95% thrombotic lesion followed by 55% stenosed OM 2 is the culprit lesion and 85% ostial proximal LAD. => Patient was chest pain-free, EF 45 to 50%. => Plan was for staged PCI of LCx and LAD. 12/07/2017: Staged PCI: Mid distal LCx with 55% OM 2-> scoring balloon angioplasty of OM 2 reducing to 20%, DES  PCI with Synergy DES 3.0 x 16 (3.3 mm); proximal LAD DES PCI with Synergy DES 3.0 x 16 (3.58m Echo EF 45 to 50%.  Moderate AI.  Mild to moderate MR.  No ARB/ACE ARB because of renal insufficiency. He had done very well up until August 2023. [facility].  Worsening lower EXTR edema.  Noted to have CHF with BNP of 1060 and troponin elevation of 277.  [facility] => Diuresed aggressively with improvement in respiratory status and renal function. Diagnostic catheterization revealed severe disease involving left main, LCx/LAD but with patent previously placed stents.  Not felt to be prudent candidate for CABG in Heart Team Discussion -> options of palliative care versus IVUS PCI discussed.  Based on the amount of symptom that he was having at this time with angina and exertional dyspnea, patient opted for complex high risk PCI to LM-LAD-LCx He did have issues with renal function-creatinine 2.1 at discharge..Marland Kitchen PAF: Initially noted in setting of inferolateral STEMI.  Not on beta-blocker because of bradycardia.  Did not want to be on following coagulation because of issues with falling. Eliquis discontinued on discharge following left main PCI  because of rare episodes of PAF in order to allow for DAPT with LM stenting. HTN, HLD    RBenen Weidawas last seen on 05/12/2022 as a second post hospital follow-up.. Was doing well at that time. Had some concerns about decline in functional status, was frustrated that he was not able to ride his bike. Also asked about driving. Was told that he would probably be safe to drive on back roads but that his reflexes were probably not adequate for highway driving. No PND, orthopnea, edema at that time. [place] at that time indicated no a fib.   Recent Hospitalizations: None since last visit  Reviewed  CV studies:    The following studies  were reviewed today: (if available, images/films reviewed: From Epic Chart or Care Everywhere) No new studies  Interval History:   Paul Gallopresents today as an urgent "work-in" visit as the patient feels that he is "going down".  Noticing increasing dyspnea for the last 10 days with warmth, congestion/indigestion sensation in his chest.  Some of the congestion was relieved with extra Lasix.  Not able to go to the pool because of dyspnea.  Had previously been going twice a week.  He comes in today for concern for chest burning and pain radiating to the mid back. He is also having some exercise intolerance. States he feels like he is "going downhill." Usually goes to the pool and swims a few times per week. However last week he became short of breath very quickly and has not wanted to go to the pool this week due to this. Had liberated himself from his walker but presents today needing his walker for assistance with ambulation.   CV Review of Symptoms (Summary): Cardiovascular ROS: positive for - chest pain, dyspnea on exertion, edema, and shortness of breath  Paul Kramer  to be feeling worse where.  He started to have symptoms that were more consistent with what he had prior to his last PCI.  He is having the chest discomfort which he says is a burning sensation radiating to the back but also notably worsening exertional tolerance.  He is getting exertional dyspnea and feeling of congestion sensation.  A little better with Lasix.  He is not having any resting symptoms.  No real PND or orthopnea.  Trivial baseline edema.  He is having difficulty with being on tolerate swimming because of fatigue and exercise intolerance as well as chest tightness and dyspnea.  He feels like he is backtracking a little bit and is very concerned.  We talked about our concerns about potentially having issues with his recent PCI leading to his symptoms.  REVIEWED OF SYSTEMS   Review of Systems  Constitutional:   Positive for malaise/fatigue (Worsening exercise tolerance.). Negative for chills, diaphoresis, fever and weight loss.  HENT:  Negative for congestion.   Respiratory:  Positive for shortness of breath.   Cardiovascular:  Positive for chest pain and leg swelling. Negative for palpitations and PND.  Gastrointestinal:  Negative for blood in stool and melena.  Genitourinary:  Negative for hematuria.  Musculoskeletal:  Positive for back pain. Negative for myalgias.  Neurological:  Positive for weakness (Generalized). Negative for dizziness.  All other systems reviewed and are negative.   I have reviewed and (if needed) personally updated the patient's problem list, medications, allergies, past medical and surgical history, social and family history.   PAST MEDICAL HISTORY   Past Medical History:  Diagnosis Date   First detected episode of atrial fibrillation ([place]) 11/29/2017   In setting of STEMI -- NO further documented episodes   Hypercholesteremia    Hypertension    Multiple vessel coronary artery disease    Cath 11/29/17 - 3 V CAD (CTO RCA), 95% m-d Cx (@ OM2 55%)-> PCI Cx-PTCA Om2; 85% p-m LAD => DES PCI ; 01/2022 -> LM<LAD-LCx 80%-80% - 95%, with patent LAD and LCx stents. => Staged atherectomy-shockwave bifurcation LM-LAD-LCx PCI.   Myocardial infarction of inferolateral wall ([place]) 11/2017   ? prior Infarct; - Cath 11/29/17 - 3 V CAD (CTO RCA), 95% m-d Cx (@ OM2 55%)-> PCI Cx-PTCA Om2; 85% p-m LAD => DES PCI    Narcolepsy    pt may pass out when laughing   Spinal stenosis    TIA (transient ischemic attack)     PAST SURGICAL HISTORY   Past Surgical History:  Procedure Laterality Date   ARTERIAL LINE INSERTION N/A 02/21/2022   Procedure: ARTERIAL LINE INSERTION;  Surgeon: Leonie Man, MD;  Location: [facility];  Service: Cardiovascular;  Laterality: N/A;   CENTRAL LINE INSERTION  02/21/2022   Procedure: CENTRAL LINE INSERTION;  Surgeon: Leonie Man, MD;  Location:  [facility];  Service: Cardiovascular;;   CORONARY ATHERECTOMY N/A 02/21/2022   Procedure: CORONARY ATHERECTOMY;  Surgeon: Leonie Man, MD;  Location: [facility];  Service: Cardiovascular;: Atherectomy mid LM into proximal LCx followed by bifurcation stenting   CORONARY BALLOON ANGIOPLASTY N/A 11/29/2017   Procedure: CORONARY BALLOON ANGIOPLASTY;  Surgeon: Leonie Man, MD;  Location: [facility];  Service: Cardiovascular;;  - PTCA of RCA - unable to cross -- LIKELY CTO.   CORONARY LITHOTRIPSY N/A 02/21/2022   Procedure: CORONARY LITHOTRIPSY;  Surgeon: Leonie Man, MD;  Location: [facility];  Service: Cardiovascular;: Ostial  LAD   CORONARY STENT INTERVENTION N/A 12/07/2017   Procedure: CORONARY STENT INTERVENTION;  Surgeon: Leonie Man, MD;  Location: [facility];  Service: Cardiovascular; Mid-dist LCx 95% with 55% OM2 => Scoring PTCA of OM2 (20%), LCx PCI - Synergy DES 3 x 16 (3.3 mm); prox LAD 85% PCI- Synergy DES 3 x 16 (3.6 mm).   CORONARY STENT INTERVENTION N/A 02/21/2022   Procedure: CORONARY STENT INTERVENTION;  Surgeon: Leonie Man, MD;  Location: [facility];  Service:CV:: mLM-pLCx IVUS -Guided CSI Atherectomy & LM-LAD Scoring Balloon/Shockwave Lithotripsy & Bifurcation PCI ([place]): ostLM-pLCX: Syncergy DES 3.0 x 38 mm (Post-dil 4.61m -LM & 3.564mLCx, mLM-pLAD (overlapping prior LAD stent): Synergy DES 3.0 x 24 mm  (post-dil: 3.3 LAD, 4.1 LM)   LEFT HEART CATH N/A 12/07/2017   Procedure: Left Heart Cath;  Surgeon: HaLeonie ManMD;  [facility];  Service: Cardiovascular;  Laterality: N/A;   LEFT HEART CATH AND CORONARY ANGIOGRAPHY N/A 11/29/2017   Procedure: LEFT HEART CATH AND CORONARY ANGIOGRAPHY;  Surgeon: HaLeonie ManMD;  [facility];; mRCA CTO, p-m LAD 85%, m-dCx 95% @ OM2 55%. Mod High LVEDP    LEFT HEART CATH AND CORONARY ANGIOGRAPHY N/A 02/18/2022   Procedure: LEFT HEART  CATH AND CORONARY ANGIOGRAPHY;  Surgeon: CoSherren MochaMD;  [facility];  Service: CV:: Severe calcific diffusely LM disease with very severe calcified disease (95%) LCx and 80% LM and LAD.  Patent LAD and LCx stents.  Known RCA CTO.  No downstream disease of more than 20 to 30%.   LIPOMA EXCISION     SPINAL FIXATION SURGERY     TRANSTHORACIC ECHOCARDIOGRAM  11/30/2017   EF 45-50%.  Inferior hypokinesis.  Elevated filling pressures.  Mild to moderate MR.    TRANSTHORACIC ECHOCARDIOGRAM  02/16/2022   a) 01/20/22: Low Nl EF 50-55%. Gr 2 DD. Mod LAD Dilation. Mild-Mod MR. Mild Calcific AS with Mod AI. Asc Ao ~4.3 mm.;; b) 02/16/22: A on C HF -> EF 45-50%. No RWMA. Gr 2 DD & high LAP. Mod LA dilation. Mild MR. Mod AoV Ca++ - mod Sclerosis. Mild-Mod AI. Asc Ao 4.0 cm   . Staged PCI 02/21/2022: Mid Left Main-Proximal LAD: Score flex angioplasty followed by shockwave lithotripsySuccessful IVUS guided, CSI atherectomy and Shockwave Lithotripsy assisted Bifurcation Left Main-LAD-LCx DES PCI using 2 stent [place]otte technique with final kissing balloon (3.25 mm [place] balloons) and POT of LM (4.0 mm [place] balloon): Ostial Left Main-Proximal LCx: CSI atherectomy (4 passes at low speed, distal to proximal) -> Synergy DES 3.0 mm x 38 mm (IVUS guided) -> post elevated to 3.5 mm in the LCx and 4.1 mm in the LM. Mid Left Main-Proximal LAD-(IVUS guided) ScoreFlex atherotomy, followed by Shockwave Lithotripsy - Synergy DES 3.0 mm x 24 mm --> deployed to 3.3 mm in LAD and 4.0 mm in LM.  Dominance: Right                                                                           Intervention      There is no immunization history on file for this patient.  MEDICATIONS/ALLERGIES   Current  Meds  Medication Sig   acetaminophen (TYLENOL) 500 MG tablet Take 500 mg by mouth every 6 (six) hours as needed for mild pain or headache.   allopurinol (ZYLOPRIM) 100 MG tablet Take 100 mg by mouth daily.   aspirin EC 81  MG tablet Take 1 tablet (81 mg total) by mouth daily. Swallow whole.   atorvastatin (LIPITOR) 40 MG tablet TAKE 1 TABLET DAILY AT 6 P.M. (Patient taking differently: Take 40 mg by mouth every evening.)   Cholecalciferol (VITAMIN D) 50 MCG (2000 UT) CAPS Take 2,000 Units by mouth daily.   clopidogrel (PLAVIX) 75 MG tablet Take 1 tablet (75 mg total) by mouth daily.   Cyanocobalamin (VITAMIN B-12) 5000 MCG TBDP Take 5,000 mcg by mouth daily.   fluticasone (FLONASE) 50 MCG/ACT nasal spray Place 1 spray into both nostrils daily.   furosemide (LASIX) 20 MG tablet Take 20 mg by mouth as needed. - has been taking 1 / week & recently 1 -2 extra for dyspnea/congestion   Histamine Dihydrochloride (AUSTRALIAN DREAM ARTHRITIS) 0.025 % CREA Apply 1 Application topically daily as needed (knee pain).   Melatonin 10 MG CAPS Take 10 mg by mouth at bedtime as needed (sleep).   metoprolol tartrate (LOPRESSOR) 25 MG tablet Take 0.5 tablets (12.5 mg total) by mouth 2 (two) times daily.   Multiple Vitamins-Minerals (CENTRUM SILVER PO) Take 1 tablet by mouth daily.   nitroGLYCERIN (NITROSTAT) 0.4 MG SL tablet DISSOLVE ONE TABLET UNDER THE TONGUE EVERY 5 MINUTES AS NEEDED FOR CHEST PAIN.  DO NOT EXCEED A TOTAL OF 3 DOSES IN 15 MINUTES (Patient taking differently: Place 0.4 mg under the tongue every 5 (five) minutes x 3 doses as needed for chest pain.)   predniSONE (DELTASONE) 50 MG tablet Please take Prednisone '50mg'$  by mouth at:Thirteen hours prior to cath 11:00pm on Thursday,Seven hours prior to cath 5:00am on Friday and prior to leaving home please take last dose of Prednisone '50mg'$  and Benadryl '50mg'$  by mouth.10:00am   terazosin (HYTRIN) 2 MG capsule Take 4 mg by mouth every evening.    Allergies  Allergen Reactions   Other Shortness Of Breath    Esters   Robitussin Dm Max Day-Night Anaphylaxis and Swelling    Throat swelling   Banana Other (See Comments)    Severe indigestion   Contrast Media [Iodinated Contrast  Media] Other (See Comments)    Just feel bad all over   Morphine And Related Other (See Comments)    Patient states he does not like to take Morphine as it gives him nightmares and hallucinations.     SOCIAL HISTORY/FAMILY HISTORY   Reviewed in Epic:  Pertinent findings:  Social History   Tobacco Use   Smoking status: Former   Smokeless tobacco: Never  Substance Use Topics   Alcohol use: Yes    Comment: rare   Drug use: Never   Social History   Social History Narrative   Not on file    OBJCTIVE -PE, EKG, labs   Wt Readings from Last 3 Encounters:  07/22/22 188 lb 9.6 oz (85.5 kg)  05/12/22 188 lb (85.3 kg)  03/04/22 183 lb 6.4 oz (83.2 kg)    Physical Exam: BP 110/68   Pulse 73   Ht '5\' 5"'$  (1.651 m)   Wt 188 lb 9.6 oz (85.5 kg)   SpO2 96%   BMI 31.38 kg/m  Physical Exam Vitals reviewed.  Constitutional:      General: He is not in acute distress.  Appearance: He is ill-appearing. He is not toxic-appearing.     Comments: Tired-appearing but non-toxic  HENT:     Head: Normocephalic.  Neck:     Vascular: No carotid bruit.  Cardiovascular:     Rate and Rhythm: Normal rate and regular rhythm.     Heart sounds: No murmur heard.    No friction rub. No Kramer.  Pulmonary:     Effort: Pulmonary effort is normal. No respiratory distress.     Breath sounds: No wheezing, rhonchi or rales.  Abdominal:     General: Abdomen is flat. There is no distension.     Palpations: Abdomen is soft.     Tenderness: There is no abdominal tenderness.  Musculoskeletal:     Cervical back: Normal range of motion and neck supple.     Right lower leg: Edema (1+) present.     Left lower leg: Edema (1+) present.  Skin:    General: Skin is warm and dry.     Capillary Refill: Capillary refill takes less than 2 seconds.  Neurological:     General: No focal deficit present.     Mental Status: He is alert and oriented to person, place, and time.  Psychiatric:        Mood and Affect:  Mood normal.      Adult ECG Report  Rate: 73 ;  Rhythm: normal sinus rhythm;   Narrative Interpretation: New ST depressions in the lateral leads when compared to . Prolonged PR interval to 223m.   Recent Labs:    Lab Results  Component Value Date   CHOL 110 02/16/2022   HDL 43 02/16/2022   LDLCALC 55 02/16/2022   TRIG 60 02/16/2022   CHOLHDL 2.6 02/16/2022   Lab Results  Component Value Date   CREATININE 1.68 (H) 03/04/2022   BUN 34 (H) 03/04/2022   NA 141 03/04/2022   K 5.1 03/04/2022   CL 106 03/04/2022   CO2 19 (L) 03/04/2022      Latest Ref Rng & Units 03/04/2022   10:41 AM 02/23/2022   12:04 AM 02/22/2022   12:36 AM  CBC  WBC 3.4 - 10.8 x10E3/uL 7.1  9.9  9.3   Hemoglobin 13.0 - 17.7 g/dL 11.8  10.2  10.6   Hematocrit 37.5 - 51.0 % 36.1  29.6  31.1   Platelets 150 - 450 x10E3/uL 236  167  174     Lab Results  Component Value Date   HGBA1C 5.6 02/16/2022   Lab Results  Component Value Date   TSH 5.579 (H) 02/16/2022    ================================================== I spent a total of 43 minutes with the patient spent in direct patient consultation.  Additional time spent with chart review  / charting (studies, outside notes, etc): 35 min Total Time: 62 min  Current medicines are reviewed at length with the patient today.  (+/- concerns) n/a  Notice: This dictation was prepared with Dragon dictation along with smart phrase technology. Any transcriptional errors that result from this process are unintentional and may not be corrected upon review.  Studies Ordered:   Orders Placed This Encounter  Procedures   Basic metabolic panel   EKG 102-HENI  Meds ordered this encounter  Medications   predniSONE (DELTASONE) 50 MG tablet    Sig: Please take Prednisone '50mg'$  by mouth at:Thirteen hours prior to cath 11:00pm on Thursday,Seven hours prior to cath 5:00am on Friday and prior to leaving home please take last dose of Prednisone '50mg'$  and  Benadryl '50mg'$  by  mouth.10:00am    Dispense:  3 tablet    Refill:  0    Patient Instructions / Medication Changes & Studies & Tests Ordered   Patient Instructions  Medication Instructions:   Do not take Metoprolol if your blood pressure ( systolic) less than 209  *If you need a refill on your cardiac medications before your next appointment, please call your pharmacy*   Lab Work: BMP today If you have labs (blood work) drawn today and your tests are completely normal, you will receive your results only by: [place] (if you have MyChart) OR A paper copy in the mail If you have any lab test that is abnormal or we need to change your treatment, we will call you to review the results.   Testing/Procedures: Your physician has requested that you have a cardiac catheterization. Cardiac catheterization is used to diagnose and/or treat various heart conditions. Doctors may recommend this procedure for a number of different reasons. The most common reason is to evaluate chest pain. Chest pain can be a symptom of coronary artery disease (CAD), and cardiac catheterization can show whether plaque is narrowing or blocking your heart's arteries. This procedure is also used to evaluate the valves, as well as measure the blood flow and oxygen levels in different parts of your heart. Please follow instruction sheet, as given.   Follow-Up: At [facility], you and your health needs are our priority.  As part of our continuing mission to provide you with exceptional heart care, we have created designated Provider Care Teams.  These Care Teams include your primary Cardiologist (physician) and Advanced Practice Providers (APPs -  Physician Assistants and Nurse Practitioners) who all work together to provide you with the care you need, when you need it.     Your next appointment:   2 week(s)  The format for your next appointment:   In Person  Provider:     Almyra Deforest PA, , Diona Browner PA  Other Instructions   .You are scheduled for a Cardiac Catheterization on Friday, January 26 with Dr. Glenetta Hew. -> Instructions provided  Shared Decision Making/Informed Consent The risks [stroke (1 in 1000), death (1 in 1000), kidney failure [usually temporary] (1 in 500), bleeding (1 in 200), allergic reaction [possibly serious] (1 in 200)], benefits (diagnostic support and management of coronary artery disease) and alternatives of a cardiac catheterization were discussed in detail with Mr. Koelling and he is willing to proceed.   Thank you for allowing [place] to care for you!   -- [facility]    Signed:  Dorene Grebe. Joelyn Oms, [place]  I have seen, examined and evaluated the patient along with the Resident Physician (Dr. Joelyn Oms) in clinic today.  I personally performed my own interview & exanimation.  After reviewing all the available data and chart, we discussed the patients laboratory, study & physical findings as well as symptoms in detail. I agree with his findings, examination as well as impression recommendations as per our discussion.    Attending adjustments int the full clinic noted annotated in [place].   Unfortunately gone posterior proximal ureter is presenting with symptoms that are very concerning for progression of his CAD.  Concern for possible in-stent restenosis or thrombosis despite being on DAPT.  We talked about options for evaluation including checking an echocardiogram or even a stress test but these would only delay definitive evaluation.  Since he was so  active leading up to this, and wants to continue on with active quality of life, I really think the only option is to proceed with cardiac catheterization.   He and his daughter were present and fully in agreement with this decision.  See informed decision making.    Leonie Man, MD, MS Glenetta Hew, M.D., M.S. Interventional Cardiologist  [place]   Pager # [phone] Phone # [phone] [address] [place], [place] [address]

## 2022-07-23 ENCOUNTER — Encounter: Payer: Self-pay | Admitting: Cardiology

## 2022-07-23 LAB — BASIC METABOLIC PANEL
BUN/Creatinine Ratio: 18 (ref 10–24)
BUN: 41 mg/dL — ABNORMAL HIGH (ref 10–36)
CO2: 20 mmol/L (ref 20–29)
Calcium: 9 mg/dL (ref 8.6–10.2)
Chloride: 104 mmol/L (ref 96–106)
Creatinine, Ser: 2.29 mg/dL — ABNORMAL HIGH (ref 0.76–1.27)
Glucose: 125 mg/dL — ABNORMAL HIGH (ref 70–99)
Potassium: 5.5 mmol/L — ABNORMAL HIGH (ref 3.5–5.2)
Sodium: 140 mmol/L (ref 134–144)
eGFR: 26 mL/min/{1.73_m2} — ABNORMAL LOW (ref >59)

## 2022-07-23 NOTE — Telephone Encounter (Signed)
I don't think we need to change meds for now -- just hold off on lasix until after cath.  Cath will probably be a "staged" procedure if we find a blockage -- this minimizes contrast.   Glenetta Hew, MD   Glenetta Hew, MD

## 2022-07-24 ENCOUNTER — Telehealth: Payer: Self-pay | Admitting: *Deleted

## 2022-07-24 NOTE — Telephone Encounter (Signed)
Cardiac Catheterization scheduled at [facility] for: Friday July 25, 2022 12 Noon Arrival time and place: [facility] Main Entrance A at: 7 AM-pre-procedure hydration  Nothing to eat after midnight prior to procedure, clear liquids until 5 AM day of procedure.  Contrast allergy: 13 hour Prednisone and Benadryl Prep reviewed: 07/24/22 Prednisone 50 mg 11 PM 07/20/2022 Prednisone 50 mg 5 AM 07/10/2022 Prednisone 50 mg and Benadryl 50 mg -I have asked patient to take these medications with him to hospital and take at hospital at 10 AM.  I have asked him to let hospital staff know he has these medications with him.  Medication instructions: -Hold:  Lasix until post procedure per Dr Ellyn Hack -Except hold medications usual morning medications can be taken with sips of water including aspirin 81 mg and Plavix 75 mg.  Confirmed patient has responsible adult to drive home post procedure and be with patient first 24 hours after arriving home.  Patient reports no new symptoms concerning for COVID-19 in the past 10 days.  Reviewed procedure instructions with patient and patient's daughter (pt verbal permission), Vinnie Level.

## 2022-07-25 ENCOUNTER — Other Ambulatory Visit: Payer: Self-pay

## 2022-07-25 ENCOUNTER — Inpatient Hospital Stay (HOSPITAL_COMMUNITY): Payer: Medicare Other

## 2022-07-25 ENCOUNTER — Encounter (HOSPITAL_COMMUNITY): Admission: AD | Disposition: E | Payer: Self-pay | Source: Home / Self Care | Attending: Cardiology

## 2022-07-25 ENCOUNTER — Inpatient Hospital Stay (HOSPITAL_COMMUNITY)
Admission: AD | Admit: 2022-07-25 | Discharge: 2022-07-31 | DRG: 270 | Disposition: E | Payer: Medicare Other | Attending: Cardiology | Admitting: Cardiology

## 2022-07-25 ENCOUNTER — Inpatient Hospital Stay: Payer: Self-pay

## 2022-07-25 DIAGNOSIS — I130 Hypertensive heart and chronic kidney disease with heart failure and stage 1 through stage 4 chronic kidney disease, or unspecified chronic kidney disease: Secondary | ICD-10-CM | POA: Diagnosis present

## 2022-07-25 DIAGNOSIS — I255 Ischemic cardiomyopathy: Secondary | ICD-10-CM | POA: Diagnosis present

## 2022-07-25 DIAGNOSIS — Z66 Do not resuscitate: Secondary | ICD-10-CM | POA: Diagnosis present

## 2022-07-25 DIAGNOSIS — R570 Cardiogenic shock: Secondary | ICD-10-CM | POA: Diagnosis present

## 2022-07-25 DIAGNOSIS — I2510 Atherosclerotic heart disease of native coronary artery without angina pectoris: Secondary | ICD-10-CM | POA: Diagnosis present

## 2022-07-25 DIAGNOSIS — I272 Pulmonary hypertension, unspecified: Secondary | ICD-10-CM | POA: Diagnosis present

## 2022-07-25 DIAGNOSIS — E7800 Pure hypercholesterolemia, unspecified: Secondary | ICD-10-CM | POA: Diagnosis present

## 2022-07-25 DIAGNOSIS — E872 Acidosis, unspecified: Secondary | ICD-10-CM | POA: Diagnosis present

## 2022-07-25 DIAGNOSIS — Z885 Allergy status to narcotic agent status: Secondary | ICD-10-CM

## 2022-07-25 DIAGNOSIS — Z87891 Personal history of nicotine dependence: Secondary | ICD-10-CM

## 2022-07-25 DIAGNOSIS — J9601 Acute respiratory failure with hypoxia: Secondary | ICD-10-CM | POA: Diagnosis not present

## 2022-07-25 DIAGNOSIS — D649 Anemia, unspecified: Secondary | ICD-10-CM | POA: Diagnosis present

## 2022-07-25 DIAGNOSIS — I252 Old myocardial infarction: Secondary | ICD-10-CM

## 2022-07-25 DIAGNOSIS — I5023 Acute on chronic systolic (congestive) heart failure: Secondary | ICD-10-CM

## 2022-07-25 DIAGNOSIS — Z515 Encounter for palliative care: Secondary | ICD-10-CM

## 2022-07-25 DIAGNOSIS — Z7901 Long term (current) use of anticoagulants: Secondary | ICD-10-CM | POA: Diagnosis not present

## 2022-07-25 DIAGNOSIS — D72829 Elevated white blood cell count, unspecified: Secondary | ICD-10-CM | POA: Diagnosis present

## 2022-07-25 DIAGNOSIS — I200 Unstable angina: Secondary | ICD-10-CM | POA: Diagnosis not present

## 2022-07-25 DIAGNOSIS — Y831 Surgical operation with implant of artificial internal device as the cause of abnormal reaction of the patient, or of later complication, without mention of misadventure at the time of the procedure: Secondary | ICD-10-CM | POA: Diagnosis present

## 2022-07-25 DIAGNOSIS — N184 Chronic kidney disease, stage 4 (severe): Secondary | ICD-10-CM | POA: Diagnosis present

## 2022-07-25 DIAGNOSIS — N1832 Chronic kidney disease, stage 3b: Secondary | ICD-10-CM | POA: Diagnosis not present

## 2022-07-25 DIAGNOSIS — Z888 Allergy status to other drugs, medicaments and biological substances status: Secondary | ICD-10-CM

## 2022-07-25 DIAGNOSIS — N179 Acute kidney failure, unspecified: Secondary | ICD-10-CM | POA: Diagnosis present

## 2022-07-25 DIAGNOSIS — E871 Hypo-osmolality and hyponatremia: Secondary | ICD-10-CM | POA: Diagnosis present

## 2022-07-25 DIAGNOSIS — Z79899 Other long term (current) drug therapy: Secondary | ICD-10-CM | POA: Diagnosis not present

## 2022-07-25 DIAGNOSIS — Z91041 Radiographic dye allergy status: Secondary | ICD-10-CM

## 2022-07-25 DIAGNOSIS — I25110 Atherosclerotic heart disease of native coronary artery with unstable angina pectoris: Secondary | ICD-10-CM | POA: Diagnosis present

## 2022-07-25 DIAGNOSIS — Z7902 Long term (current) use of antithrombotics/antiplatelets: Secondary | ICD-10-CM

## 2022-07-25 DIAGNOSIS — Z7982 Long term (current) use of aspirin: Secondary | ICD-10-CM | POA: Diagnosis not present

## 2022-07-25 DIAGNOSIS — Z8673 Personal history of transient ischemic attack (TIA), and cerebral infarction without residual deficits: Secondary | ICD-10-CM

## 2022-07-25 DIAGNOSIS — I5043 Acute on chronic combined systolic (congestive) and diastolic (congestive) heart failure: Secondary | ICD-10-CM

## 2022-07-25 DIAGNOSIS — I480 Paroxysmal atrial fibrillation: Secondary | ICD-10-CM | POA: Diagnosis present

## 2022-07-25 DIAGNOSIS — Z91018 Allergy to other foods: Secondary | ICD-10-CM

## 2022-07-25 DIAGNOSIS — T82855A Stenosis of coronary artery stent, initial encounter: Secondary | ICD-10-CM | POA: Diagnosis present

## 2022-07-25 DIAGNOSIS — N281 Cyst of kidney, acquired: Secondary | ICD-10-CM | POA: Diagnosis not present

## 2022-07-25 DIAGNOSIS — Z01812 Encounter for preprocedural laboratory examination: Principal | ICD-10-CM

## 2022-07-25 DIAGNOSIS — R918 Other nonspecific abnormal finding of lung field: Secondary | ICD-10-CM | POA: Diagnosis not present

## 2022-07-25 DIAGNOSIS — J984 Other disorders of lung: Secondary | ICD-10-CM | POA: Diagnosis not present

## 2022-07-25 DIAGNOSIS — J811 Chronic pulmonary edema: Secondary | ICD-10-CM | POA: Diagnosis not present

## 2022-07-25 DIAGNOSIS — I5022 Chronic systolic (congestive) heart failure: Secondary | ICD-10-CM

## 2022-07-25 DIAGNOSIS — Z452 Encounter for adjustment and management of vascular access device: Secondary | ICD-10-CM | POA: Diagnosis not present

## 2022-07-25 HISTORY — PX: IABP INSERTION: CATH118242

## 2022-07-25 HISTORY — PX: RIGHT HEART CATH AND CORONARY ANGIOGRAPHY: CATH118264

## 2022-07-25 LAB — POCT I-STAT EG7
Acid-base deficit: 7 mmol/L — ABNORMAL HIGH (ref 0.0–2.0)
Acid-base deficit: 7 mmol/L — ABNORMAL HIGH (ref 0.0–2.0)
Bicarbonate: 17.7 mmol/L — ABNORMAL LOW (ref 20.0–28.0)
Bicarbonate: 18.6 mmol/L — ABNORMAL LOW (ref 20.0–28.0)
Calcium, Ion: 1.06 mmol/L — ABNORMAL LOW (ref 1.15–1.40)
Calcium, Ion: 1.15 mmol/L (ref 1.15–1.40)
HCT: 27 % — ABNORMAL LOW (ref 39.0–52.0)
HCT: 28 % — ABNORMAL LOW (ref 39.0–52.0)
Hemoglobin: 9.2 g/dL — ABNORMAL LOW (ref 13.0–17.0)
Hemoglobin: 9.5 g/dL — ABNORMAL LOW (ref 13.0–17.0)
O2 Saturation: 36 %
O2 Saturation: 44 %
Potassium: 4.8 mmol/L (ref 3.5–5.1)
Potassium: 5.1 mmol/L (ref 3.5–5.1)
Sodium: 134 mmol/L — ABNORMAL LOW (ref 135–145)
Sodium: 137 mmol/L (ref 135–145)
TCO2: 19 mmol/L — ABNORMAL LOW (ref 22–32)
TCO2: 20 mmol/L — ABNORMAL LOW (ref 22–32)
pCO2, Ven: 32 mmHg — ABNORMAL LOW (ref 44–60)
pCO2, Ven: 34.4 mmHg — ABNORMAL LOW (ref 44–60)
pH, Ven: 7.34 (ref 7.25–7.43)
pH, Ven: 7.352 (ref 7.25–7.43)
pO2, Ven: 23 mmHg — CL (ref 32–45)
pO2, Ven: 25 mmHg — CL (ref 32–45)

## 2022-07-25 LAB — POCT I-STAT 7, (LYTES, BLD GAS, ICA,H+H)
Acid-base deficit: 9 mmol/L — ABNORMAL HIGH (ref 0.0–2.0)
Bicarbonate: 14.1 mmol/L — ABNORMAL LOW (ref 20.0–28.0)
Calcium, Ion: 1.1 mmol/L — ABNORMAL LOW (ref 1.15–1.40)
HCT: 27 % — ABNORMAL LOW (ref 39.0–52.0)
Hemoglobin: 9.2 g/dL — ABNORMAL LOW (ref 13.0–17.0)
O2 Saturation: 98 %
Potassium: 4.9 mmol/L (ref 3.5–5.1)
Sodium: 134 mmol/L — ABNORMAL LOW (ref 135–145)
TCO2: 15 mmol/L — ABNORMAL LOW (ref 22–32)
pCO2 arterial: 22 mmHg — ABNORMAL LOW (ref 32–48)
pH, Arterial: 7.415 (ref 7.35–7.45)
pO2, Arterial: 102 mmHg (ref 83–108)

## 2022-07-25 LAB — CBC
HCT: 31.4 % — ABNORMAL LOW (ref 39.0–52.0)
Hemoglobin: 10.5 g/dL — ABNORMAL LOW (ref 13.0–17.0)
MCH: 35.1 pg — ABNORMAL HIGH (ref 26.0–34.0)
MCHC: 33.4 g/dL (ref 30.0–36.0)
MCV: 105 fL — ABNORMAL HIGH (ref 80.0–100.0)
Platelets: 182 10*3/uL (ref 150–400)
RBC: 2.99 MIL/uL — ABNORMAL LOW (ref 4.22–5.81)
RDW: 12.7 % (ref 11.5–15.5)
WBC: 6.8 10*3/uL (ref 4.0–10.5)
nRBC: 0 % (ref 0.0–0.2)

## 2022-07-25 LAB — POCT I-STAT, CHEM 8
BUN: 48 mg/dL — ABNORMAL HIGH (ref 8–23)
Calcium, Ion: 1.15 mmol/L (ref 1.15–1.40)
Chloride: 106 mmol/L (ref 98–111)
Creatinine, Ser: 3.3 mg/dL — ABNORMAL HIGH (ref 0.61–1.24)
Glucose, Bld: 194 mg/dL — ABNORMAL HIGH (ref 70–99)
HCT: 28 % — ABNORMAL LOW (ref 39.0–52.0)
Hemoglobin: 9.5 g/dL — ABNORMAL LOW (ref 13.0–17.0)
Potassium: 5.1 mmol/L (ref 3.5–5.1)
Sodium: 133 mmol/L — ABNORMAL LOW (ref 135–145)
TCO2: 16 mmol/L — ABNORMAL LOW (ref 22–32)

## 2022-07-25 LAB — BASIC METABOLIC PANEL
Anion gap: 9 (ref 5–15)
BUN: 55 mg/dL — ABNORMAL HIGH (ref 8–23)
CO2: 20 mmol/L — ABNORMAL LOW (ref 22–32)
Calcium: 8.2 mg/dL — ABNORMAL LOW (ref 8.9–10.3)
Chloride: 104 mmol/L (ref 98–111)
Creatinine, Ser: 2.97 mg/dL — ABNORMAL HIGH (ref 0.61–1.24)
GFR, Estimated: 19 mL/min — ABNORMAL LOW (ref >60)
Glucose, Bld: 190 mg/dL — ABNORMAL HIGH (ref 70–99)
Potassium: 4.9 mmol/L (ref 3.5–5.1)
Sodium: 133 mmol/L — ABNORMAL LOW (ref 135–145)

## 2022-07-25 LAB — GLUCOSE, CAPILLARY
Glucose-Capillary: 172 mg/dL — ABNORMAL HIGH (ref 70–99)
Glucose-Capillary: 207 mg/dL — ABNORMAL HIGH (ref 70–99)

## 2022-07-25 LAB — URINALYSIS, ROUTINE W REFLEX MICROSCOPIC
Bilirubin Urine: NEGATIVE
Glucose, UA: NEGATIVE mg/dL
Hgb urine dipstick: NEGATIVE
Ketones, ur: NEGATIVE mg/dL
Leukocytes,Ua: NEGATIVE
Nitrite: NEGATIVE
Protein, ur: NEGATIVE mg/dL
Specific Gravity, Urine: 1.02 (ref 1.005–1.030)
pH: 5 (ref 5.0–8.0)

## 2022-07-25 LAB — COOXEMETRY PANEL
Carboxyhemoglobin: 0.9 % (ref 0.5–1.5)
Carboxyhemoglobin: 1.4 % (ref 0.5–1.5)
Methemoglobin: <0.7 % (ref 0.0–1.5)
Methemoglobin: <0.7 % (ref 0.0–1.5)
O2 Saturation: 40.9 %
O2 Saturation: 57.4 %
Total hemoglobin: 9.8 g/dL — ABNORMAL LOW (ref 12.0–16.0)
Total hemoglobin: 8.6 g/dL — ABNORMAL LOW (ref 12.0–16.0)

## 2022-07-25 SURGERY — RIGHT HEART CATH AND CORONARY ANGIOGRAPHY
Anesthesia: LOCAL

## 2022-07-25 MED ORDER — LIDOCAINE HCL (PF) 1 % IJ SOLN
INTRAMUSCULAR | Status: AC
  Filled 2022-07-25: qty 30

## 2022-07-25 MED ORDER — MIDAZOLAM HCL 2 MG/2ML IJ SOLN
INTRAMUSCULAR | Status: DC | PRN
  Administered 2022-07-25: 1 mg via INTRAVENOUS

## 2022-07-25 MED ORDER — AMIODARONE HCL IN DEXTROSE 360-4.14 MG/200ML-% IV SOLN
60.0000 mg/h | INTRAVENOUS | Status: DC
  Administered 2022-07-25 – 2022-07-26 (×4): 30 mg/h via INTRAVENOUS
  Administered 2022-07-27: 60 mg/h via INTRAVENOUS
  Filled 2022-07-25 (×5): qty 200

## 2022-07-25 MED ORDER — HEPARIN SODIUM (PORCINE) 1000 UNIT/ML IJ SOLN
INTRAMUSCULAR | Status: AC
  Filled 2022-07-25: qty 10

## 2022-07-25 MED ORDER — SODIUM CHLORIDE 0.9% FLUSH
3.0000 mL | Freq: Two times a day (BID) | INTRAVENOUS | Status: DC
  Administered 2022-07-26 – 2022-07-27 (×3): 3 mL via INTRAVENOUS

## 2022-07-25 MED ORDER — TERAZOSIN HCL 1 MG PO CAPS: 4.0000 mg | ORAL_CAPSULE | Freq: Every day | ORAL | Status: DC

## 2022-07-25 MED ORDER — INSULIN ASPART 100 UNIT/ML IJ SOLN
0.0000 [IU] | INTRAMUSCULAR | Status: DC
  Administered 2022-07-25 – 2022-07-26 (×3): 3 [IU] via SUBCUTANEOUS
  Administered 2022-07-26 (×2): 2 [IU] via SUBCUTANEOUS

## 2022-07-25 MED ORDER — VITAMIN D 25 MCG (1000 UNIT) PO TABS
2000.0000 [IU] | ORAL_TABLET | Freq: Every day | ORAL | Status: DC
  Administered 2022-07-26: 2000 [IU] via ORAL
  Filled 2022-07-25: qty 2

## 2022-07-25 MED ORDER — SODIUM CHLORIDE 0.9 % IV SOLN
INTRAVENOUS | Status: AC | PRN
  Administered 2022-07-25: 250 mL via INTRAVENOUS

## 2022-07-25 MED ORDER — HEPARIN (PORCINE) IN NACL 1000-0.9 UT/500ML-% IV SOLN
INTRAVENOUS | Status: AC
  Filled 2022-07-25: qty 1000

## 2022-07-25 MED ORDER — SODIUM CHLORIDE 0.9 % IV SOLN: INTRAVENOUS | Status: DC

## 2022-07-25 MED ORDER — ATORVASTATIN CALCIUM 40 MG PO TABS
40.0000 mg | ORAL_TABLET | Freq: Every day | ORAL | Status: DC
  Administered 2022-07-25 – 2022-07-26 (×2): 40 mg via ORAL
  Filled 2022-07-25 (×2): qty 1

## 2022-07-25 MED ORDER — SODIUM CHLORIDE 0.9 % WEIGHT BASED INFUSION
3.0000 mL/kg/h | INTRAVENOUS | Status: AC
  Administered 2022-07-25: 3 mL/kg/h via INTRAVENOUS

## 2022-07-25 MED ORDER — AMIODARONE HCL IN DEXTROSE 360-4.14 MG/200ML-% IV SOLN: 60.0000 mg/h | INTRAVENOUS | Status: DC

## 2022-07-25 MED ORDER — FENTANYL CITRATE (PF) 100 MCG/2ML IJ SOLN
INTRAMUSCULAR | Status: AC
  Filled 2022-07-25: qty 2

## 2022-07-25 MED ORDER — VERAPAMIL HCL 2.5 MG/ML IV SOLN
INTRAVENOUS | Status: AC
  Filled 2022-07-25: qty 2

## 2022-07-25 MED ORDER — SODIUM CHLORIDE 0.9 % IV SOLN: 250.0000 mL | INTRAVENOUS | Status: DC | PRN

## 2022-07-25 MED ORDER — VASOPRESSIN 20 UNITS/100 ML INFUSION FOR SHOCK
0.0200 [IU]/min | INTRAVENOUS | Status: DC
  Administered 2022-07-25 – 2022-07-27 (×5): 0.03 [IU]/min via INTRAVENOUS
  Filled 2022-07-25 (×5): qty 100

## 2022-07-25 MED ORDER — SODIUM CHLORIDE 0.9% IV SOLUTION: INTRAVENOUS | Status: DC | PRN

## 2022-07-25 MED ORDER — SODIUM CHLORIDE 0.9% FLUSH: 3.0000 mL | INTRAVENOUS | Status: DC | PRN

## 2022-07-25 MED ORDER — NITROGLYCERIN 0.4 MG SL SUBL: 0.4000 mg | SUBLINGUAL_TABLET | SUBLINGUAL | Status: DC | PRN

## 2022-07-25 MED ORDER — HEPARIN (PORCINE) 25000 UT/250ML-% IV SOLN
1050.0000 [IU]/h | INTRAVENOUS | Status: DC
  Administered 2022-07-25: 800 [IU]/h via INTRAVENOUS
  Administered 2022-07-26: 1050 [IU]/h via INTRAVENOUS
  Filled 2022-07-25 (×2): qty 250

## 2022-07-25 MED ORDER — CHLORHEXIDINE GLUCONATE CLOTH 2 % EX PADS
6.0000 | MEDICATED_PAD | Freq: Every day | CUTANEOUS | Status: DC
  Administered 2022-07-25 – 2022-07-26 (×2): 6 via TOPICAL

## 2022-07-25 MED ORDER — HYDRALAZINE HCL 20 MG/ML IJ SOLN: 10.0000 mg | INTRAMUSCULAR | Status: AC | PRN

## 2022-07-25 MED ORDER — AMIODARONE HCL IN DEXTROSE 360-4.14 MG/200ML-% IV SOLN: 30.0000 mg/h | INTRAVENOUS | Status: DC

## 2022-07-25 MED ORDER — SODIUM CHLORIDE 0.9 % IV SOLN: INTRAVENOUS | Status: AC

## 2022-07-25 MED ORDER — IOHEXOL 350 MG/ML SOLN
INTRAVENOUS | Status: DC | PRN
  Administered 2022-07-25: 15 mL

## 2022-07-25 MED ORDER — SODIUM CHLORIDE 0.9% FLUSH
3.0000 mL | INTRAVENOUS | Status: DC | PRN
  Administered 2022-07-26: 3 mL via INTRAVENOUS

## 2022-07-25 MED ORDER — FLUTICASONE PROPIONATE 50 MCG/ACT NA SUSP: 1.0000 | Freq: Every day | NASAL | Status: DC | PRN

## 2022-07-25 MED ORDER — FENTANYL CITRATE (PF) 100 MCG/2ML IJ SOLN
INTRAMUSCULAR | Status: DC | PRN
  Administered 2022-07-25: 25 ug via INTRAVENOUS

## 2022-07-25 MED ORDER — SODIUM CHLORIDE 0.9% IV SOLUTION: INTRAVENOUS | Status: DC

## 2022-07-25 MED ORDER — LABETALOL HCL 5 MG/ML IV SOLN: 10.0000 mg | INTRAVENOUS | Status: DC | PRN

## 2022-07-25 MED ORDER — POLYETHYL GLYCOL-PROPYL GLYCOL 0.4-0.3 % OP SOLN: 1.0000 [drp] | Freq: Every day | OPHTHALMIC | Status: DC | PRN

## 2022-07-25 MED ORDER — MILRINONE LACTATE IN DEXTROSE 20-5 MG/100ML-% IV SOLN
0.2500 ug/kg/min | INTRAVENOUS | Status: DC
  Administered 2022-07-25 – 2022-07-27 (×5): 0.25 ug/kg/min via INTRAVENOUS
  Filled 2022-07-25 (×5): qty 100

## 2022-07-25 MED ORDER — AMIODARONE HCL IN DEXTROSE 360-4.14 MG/200ML-% IV SOLN
30.0000 mg/h | INTRAVENOUS | Status: DC
  Filled 2022-07-25: qty 200

## 2022-07-25 MED ORDER — FUROSEMIDE 10 MG/ML IJ SOLN
80.0000 mg | Freq: Two times a day (BID) | INTRAMUSCULAR | Status: DC
  Administered 2022-07-25 – 2022-07-27 (×4): 80 mg via INTRAVENOUS
  Filled 2022-07-25 (×4): qty 8

## 2022-07-25 MED ORDER — SODIUM CHLORIDE 0.9 % WEIGHT BASED INFUSION
1.0000 mL/kg/h | INTRAVENOUS | Status: DC
  Administered 2022-07-25: 1 mL/kg/h via INTRAVENOUS

## 2022-07-25 MED ORDER — VERAPAMIL HCL 2.5 MG/ML IV SOLN
INTRAVENOUS | Status: DC | PRN
  Administered 2022-07-25: 10 mL via INTRA_ARTERIAL

## 2022-07-25 MED ORDER — ASPIRIN 81 MG PO CHEW: 81.0000 mg | CHEWABLE_TABLET | ORAL | Status: DC

## 2022-07-25 MED ORDER — VITAMIN B-12 1000 MCG PO TABS
5000.0000 ug | ORAL_TABLET | Freq: Every day | ORAL | Status: DC
  Administered 2022-07-26: 5000 ug via ORAL
  Filled 2022-07-25: qty 5

## 2022-07-25 MED ORDER — ONDANSETRON HCL 4 MG/2ML IJ SOLN
4.0000 mg | Freq: Four times a day (QID) | INTRAMUSCULAR | Status: DC | PRN
  Administered 2022-07-26 – 2022-07-27 (×3): 4 mg via INTRAVENOUS
  Filled 2022-07-25 (×4): qty 2

## 2022-07-25 MED ORDER — HEPARIN SODIUM (PORCINE) 1000 UNIT/ML IJ SOLN
INTRAMUSCULAR | Status: DC | PRN
  Administered 2022-07-25: 4500 [IU] via INTRAVENOUS

## 2022-07-25 MED ORDER — CLOPIDOGREL BISULFATE 75 MG PO TABS: 75.0000 mg | ORAL_TABLET | ORAL | Status: DC

## 2022-07-25 MED ORDER — AMIODARONE LOAD VIA INFUSION
150.0000 mg | Freq: Once | INTRAVENOUS | Status: DC
  Filled 2022-07-25: qty 83.34

## 2022-07-25 MED ORDER — ASPIRIN 81 MG PO TBEC
81.0000 mg | DELAYED_RELEASE_TABLET | Freq: Every day | ORAL | Status: DC
  Administered 2022-07-26 – 2022-07-27 (×2): 81 mg via ORAL
  Filled 2022-07-25 (×2): qty 1

## 2022-07-25 MED ORDER — MIDAZOLAM HCL 2 MG/2ML IJ SOLN
INTRAMUSCULAR | Status: AC
  Filled 2022-07-25: qty 2

## 2022-07-25 MED ORDER — CLOPIDOGREL BISULFATE 75 MG PO TABS
75.0000 mg | ORAL_TABLET | Freq: Every day | ORAL | Status: DC
  Administered 2022-07-26 – 2022-07-27 (×2): 75 mg via ORAL
  Filled 2022-07-25 (×2): qty 1

## 2022-07-25 MED ORDER — MELATONIN 3 MG PO TABS
9.0000 mg | ORAL_TABLET | Freq: Every evening | ORAL | Status: DC | PRN
  Administered 2022-07-27: 9 mg via ORAL
  Filled 2022-07-25: qty 3

## 2022-07-25 MED ORDER — ACETAMINOPHEN 500 MG PO TABS
500.0000 mg | ORAL_TABLET | Freq: Four times a day (QID) | ORAL | Status: DC | PRN
  Administered 2022-07-27: 500 mg via ORAL
  Filled 2022-07-25: qty 1

## 2022-07-25 MED ORDER — HEPARIN (PORCINE) IN NACL 1000-0.9 UT/500ML-% IV SOLN
INTRAVENOUS | Status: DC | PRN
  Administered 2022-07-25 (×2): 500 mL

## 2022-07-25 MED ORDER — SODIUM CHLORIDE 0.9% FLUSH: 3.0000 mL | Freq: Two times a day (BID) | INTRAVENOUS | Status: DC

## 2022-07-25 MED ORDER — LIDOCAINE HCL (PF) 1 % IJ SOLN
INTRAMUSCULAR | Status: DC | PRN
  Administered 2022-07-25 (×2): 15 mL
  Administered 2022-07-25: 2 mL

## 2022-07-25 SURGICAL SUPPLY — 16 items
BALLN IABP SENSA PLUS 7.5F 40C (BALLOONS) ×1
BALLOON IABP SENS PLUS 7.5F40C (BALLOONS) IMPLANT
CATH OPTITORQUE TIG 4.0 6F (CATHETERS) IMPLANT
CATH SWAN GANZ 7F STRAIGHT (CATHETERS) IMPLANT
DEVICE RAD COMP TR BAND LRG (VASCULAR PRODUCTS) IMPLANT
GLIDESHEATH SLEND SS 6F .021 (SHEATH) IMPLANT
GUIDEWIRE INQWIRE 1.5J.035X260 (WIRE) IMPLANT
INQWIRE 1.5J .035X260CM (WIRE) ×1
KIT HEART LEFT (KITS) ×1 IMPLANT
PACK CARDIAC CATHETERIZATION (CUSTOM PROCEDURE TRAY) ×1 IMPLANT
SHEATH PINNACLE 5F 10CM (SHEATH) IMPLANT
SHEATH PINNACLE 7F 10CM (SHEATH) IMPLANT
SLEEVE REPOSITIONING LENGTH 30 (MISCELLANEOUS) IMPLANT
TRANSDUCER W/STOPCOCK (MISCELLANEOUS) ×1 IMPLANT
TUBING CIL FLEX 10 FLL-RA (TUBING) ×1 IMPLANT
WIRE EMERALD 3MM-J .025X260CM (WIRE) IMPLANT

## 2022-07-25 NOTE — Progress Notes (Signed)
Client took '50mg'$  prednisone po and benadryl po at 1000am

## 2022-07-25 NOTE — Consult Note (Addendum)
Advanced Heart Failure Team Consult Note   Primary Physician: Lajean Manes, MD PCP-Cardiologist:  Glenetta Hew, MD  Reason for Consultation: Acute on Chronic Systolic Heart Failure>>Shock Due to ICM   HPI:    Paul Kramer is seen today for evaluation of acute on chronic systolic heart failure>>shock, at the request of Dr. Ellyn Hack, cardiology.   Paul Kramer is a 89 y.o. male with chronic HFrEF due to ICM, CAD, paroxysmal atrial fibrillation, HTN, HLD, TIA and CKD IV.    In 2019 had inferolateral STEMI.  Cath revealed proximal and mid RCA 80% disease with mid to distal CTO w/ L>>R collaterals, + Mid to distal LCx 95% thrombotic lesion followed by 55% stenosed OM (culprit) and 85% ostial proximal LAD. Underwent staged PCI: Mid distal LCx and mid LAD.  Echo showed mildly reduced EF 45 to 50%.  Moderate AI.  Mild to moderate MR.   Required repeat cath 01/2022 for unstable angina. Diagnostic catheterization revealed severe disease involving left main, LCx/LAD but with patent previously placed stents.  Not felt to be prudent candidate for CABG in Heart Team Discussion -> options of palliative care versus IVUS PCI discussed.  Patient opted for complex high risk PCI to LM-LAD-LCx. Had successful result. Echo showed EF 45-50%, mild-mod MR, mild-mod AR and normal RV.   Presented today for elective LHC in setting of recurrent unstable angina/ exertional dyspnea. Cath showed severe in-stent restenosis of the bifurcation LM-LCx-LAD stents at the bifurcation only, (99%); known occlusion of the RCA. RHC c/w severe ischemic CM w/ CI 1.6, CO 3.07, severe secondary PH w/ mPAP 46 mmHg and PCWP 36 mmHg and LVEDP of 24 mmHg. Underwent successful placement of IABP w/ notable improvement in RH hemodynamics.  Pt being direct admitted to CICU. AHF team asked to manage low-output HF. PCI of LM currently being deferred until better optimized from HF and renal standpoint. Pre-cath SCr on 1/23 was 2.29 (b/l 1.7-2.2).  SCr today elevated at 3.30. Coronary angiography performed with <15 mL IV contrast.   Pt currently CP free at rest. No respiratory difficulty. CVP 16    Cardiac studies reviewed:  L/RHC today: Severe in-stent restenosis of the bifurcation LM-LCx-LAD stents at the bifurcation only. (99%); known occlusion of the RCA Coronary angiography performed with <15 mL IV contrast Severe ischemic cardiomyopathy with cardiac index of 1161 according to output of 3.07 by Fick which correlates with pression of renal failure (creatinine on that was up to 3 3) Severe Secondary Pulmonary (Venous) Hypertension with mean PAP of 46 mmHg and PCWP of 36 mm, and LVEDP of 24 mmHg. Successful IABP placement with notable improvement in right heart numbers.  [place] 8/23 1.  Severe calcific diffuse left main disease with very severe distal left main stenosis, disease extending into the ostium of the left circumflex and LAD 2.  Continued patency of the stented segments in the LAD and left circumflex with no high-grade obstruction beyond the complex distal left main lesion 3.  Chronic occlusion of the RCA unchanged from the prior study  Echo 8/23: EF 45-50%, mild LVH, GIIDD, LA mod dilated, mild MR TTE 01/20/2022: Low normal LVEF of roughly 50 to 55%.  GR 2 DD.  Moderate LA dilation.  Mild to moderate MR.  Calcified aortic valve with moderate AI and mild AS.  Mild aortic root dilation and ascending aortic dilation 4 to 3 mm.   Home Medications Prior to Admission medications   Medication Sig Start Date End Date Taking? Authorizing Provider  acetaminophen (TYLENOL) 500 MG tablet Take 500 mg by mouth every 6 (six) hours as needed for mild pain or headache.   Yes [provider]  allopurinol (ZYLOPRIM) 100 MG tablet Take 100 mg by mouth daily. 10/08/17  Yes [provider]  aspirin EC 81 MG tablet Take 1 tablet (81 mg total) by mouth daily. Swallow whole. 02/24/22  Yes Strader, [place] M, PA-C  atorvastatin  (LIPITOR) 40 MG tablet TAKE 1 TABLET DAILY AT 6 P.M. Patient taking differently: Take 40 mg by mouth at bedtime. 10/25/21  Yes Leonie Man, MD  Cholecalciferol (VITAMIN D) 50 MCG (2000 UT) CAPS Take 2,000 Units by mouth daily at 12 noon.   Yes [provider]  clopidogrel (PLAVIX) 75 MG tablet Take 1 tablet (75 mg total) by mouth daily. 04/02/22  Yes Leonie Man, MD  Cyanocobalamin (VITAMIN B-12) 5000 MCG TBDP Take 5,000 mcg by mouth daily at 12 noon.   Yes [provider]  diphenhydrAMINE (BENADRYL) 50 MG capsule Take 50 mg by mouth once.   Yes [provider]  fluticasone (FLONASE) 50 MCG/ACT nasal spray Place 1 spray into both nostrils daily as needed for allergies or rhinitis.   Yes [provider]  furosemide (LASIX) 20 MG tablet Take 20 mg by mouth every other day.   Yes [provider]  Histamine Dihydrochloride (AUSTRALIAN DREAM ARTHRITIS) 0.025 % CREA Apply 1 Application topically daily as needed (knee pain).   Yes [provider]  Melatonin 10 MG CAPS Take 10 mg by mouth at bedtime as needed (sleep).   Yes [provider]  metoprolol tartrate (LOPRESSOR) 25 MG tablet Take 0.5 tablets (12.5 mg total) by mouth 2 (two) times daily. Patient taking differently: Take 12.5 mg by mouth 2 (two) times daily. If greater the 100 02/23/22  Yes Strader, [place] M, PA-C  Multiple Vitamins-Minerals (CENTRUM SILVER PO) Take 1 tablet by mouth daily at 12 noon.   Yes [provider]  nitroGLYCERIN (NITROSTAT) 0.4 MG SL tablet DISSOLVE ONE TABLET UNDER THE TONGUE EVERY 5 MINUTES AS NEEDED FOR CHEST PAIN.  DO NOT EXCEED A TOTAL OF 3 DOSES IN 15 MINUTES Patient taking differently: Place 0.4 mg under the tongue every 5 (five) minutes x 3 doses as needed for chest pain. 08/09/19  Yes Bhagat, Bhavinkumar, PA  Polyethyl Glycol-Propyl Glycol (SYSTANE ULTRA) 0.4-0.3 % SOLN Place 1 drop into both eyes daily as needed (Dry eyes).   Yes  [provider]  predniSONE (DELTASONE) 50 MG tablet Please take Prednisone '50mg'$  by mouth at:Thirteen hours prior to cath 11:00pm on Thursday,Seven hours prior to cath 5:00am on Friday and prior to leaving home please take last dose of Prednisone '50mg'$  and Benadryl '50mg'$  by mouth.10:00am 07/22/22  Yes Leonie Man, MD  terazosin (HYTRIN) 2 MG capsule Take 4 mg by mouth at bedtime. 10/08/17  Yes [provider]    Past Medical History: Past Medical History:  Diagnosis Date   First detected episode of atrial fibrillation ([place]) 11/29/2017   In setting of STEMI -- NO further documented episodes   Hypercholesteremia    Hypertension    Multiple vessel coronary artery disease    Cath 11/29/17 - 3 V CAD (CTO RCA), 95% m-d Cx (@ OM2 55%)-> PCI Cx-PTCA Om2; 85% p-m LAD => DES PCI ; 01/2022 -> LM<LAD-LCx 80%-80% - 95%, with patent LAD and LCx stents. => Staged atherectomy-shockwave bifurcation LM-LAD-LCx PCI.   Myocardial infarction of inferolateral wall ([place]) 11/2017   ?  prior Infarct; - Cath 11/29/17 - 3 V CAD (CTO RCA), 95% m-d Cx (@ OM2 55%)-> PCI Cx-PTCA Om2; 85% p-m LAD => DES PCI    Narcolepsy    pt may pass out when laughing   Spinal stenosis    TIA (transient ischemic attack)     Past Surgical History: Past Surgical History:  Procedure Laterality Date   ARTERIAL LINE INSERTION N/A 02/21/2022   Procedure: ARTERIAL LINE INSERTION;  Surgeon: Leonie Man, MD;  Location: [place] [facility];  Service: Cardiovascular;  Laterality: N/A;   CENTRAL LINE INSERTION  02/21/2022   Procedure: CENTRAL LINE INSERTION;  Surgeon: Leonie Man, MD;  Location: [place] [facility];  Service: Cardiovascular;;   CORONARY ATHERECTOMY N/A 02/21/2022   Procedure: CORONARY ATHERECTOMY;  Surgeon: Leonie Man, MD;  Location: [place] [facility];  Service: Cardiovascular;: Atherectomy mid LM into proximal LCx followed by bifurcation stenting   CORONARY BALLOON ANGIOPLASTY N/A 11/29/2017    Procedure: CORONARY BALLOON ANGIOPLASTY;  Surgeon: Leonie Man, MD;  Location: [place] [facility];  Service: Cardiovascular;;  - PTCA of RCA - unable to cross -- LIKELY CTO.   CORONARY LITHOTRIPSY N/A 02/21/2022   Procedure: CORONARY LITHOTRIPSY;  Surgeon: Leonie Man, MD;  Location: [place] [facility];  Service: Cardiovascular;: Ostial LAD   CORONARY STENT INTERVENTION N/A 12/07/2017   Procedure: CORONARY STENT INTERVENTION;  Surgeon: Leonie Man, MD;  Location: [place] [facility];  Service: Cardiovascular; Mid-dist LCx 95% with 55% OM2 => Scoring PTCA of OM2 (20%), LCx PCI - Synergy DES 3 x 16 (3.3 mm); prox LAD 85% PCI- Synergy DES 3 x 16 (3.6 mm).   CORONARY STENT INTERVENTION N/A 02/21/2022   Procedure: CORONARY STENT INTERVENTION;  Surgeon: Leonie Man, MD;  Location: [place] [facility];  Service:CV:: mLM-pLCx IVUS -Guided CSI Atherectomy & LM-LAD Scoring Balloon/Shockwave Lithotripsy & Bifurcation PCI ([place]): ostLM-pLCX: Syncergy DES 3.0 x 38 mm (Post-dil 4.39m -LM & 3.578mLCx, mLM-pLAD (overlapping prior LAD stent): Synergy DES 3.0 x 24 mm  (post-dil: 3.3 LAD, 4.1 LM)   LEFT HEART CATH N/A 12/07/2017   Procedure: Left Heart Cath;  Surgeon: HaLeonie ManMD;  [facility];  Service: Cardiovascular;  Laterality: N/A;   LEFT HEART CATH AND CORONARY ANGIOGRAPHY N/A 11/29/2017   Procedure: LEFT HEART CATH AND CORONARY ANGIOGRAPHY;  Surgeon: HaLeonie ManMD;  [facility];; mRCA CTO, p-m LAD 85%, m-dCx 95% @ OM2 55%. Mod High LVEDP    LEFT HEART CATH AND CORONARY ANGIOGRAPHY N/A 02/18/2022   Procedure: LEFT HEART CATH AND CORONARY ANGIOGRAPHY;  Surgeon: CoSherren MochaMD;  [facility];  Service: CV:: Severe calcific diffusely LM disease with very severe calcified disease (95%) LCx and 80% LM and LAD.  Patent LAD and LCx stents.  Known RCA CTO.  No downstream disease of more than 20 to 30%.   LIPOMA EXCISION     SPINAL  FIXATION SURGERY     TRANSTHORACIC ECHOCARDIOGRAM  11/30/2017   EF 45-50%.  Inferior hypokinesis.  Elevated filling pressures.  Mild to moderate MR.    TRANSTHORACIC ECHOCARDIOGRAM  02/16/2022   a) 01/20/22: Low Nl EF 50-55%. Gr 2 DD. Mod LAD Dilation. Mild-Mod MR. Mild Calcific AS with Mod AI. Asc Ao ~4.3 mm.;; b) 02/16/22: A on C HF -> EF 45-50%. No RWMA. Gr 2 DD & high LAP. Mod LA dilation. Mild MR. Mod AoV Ca++ -  mod Sclerosis. Mild-Mod AI. Asc Ao 4.0 cm    Family History: Family History  Family history unknown: Yes    Social History: Social History   Socioeconomic History   Marital status: Widowed    Spouse name: Not on file   Number of children: Not on file   Years of education: Not on file   Highest education level: Not on file  Occupational History   Occupation: retired  Tobacco Use   Smoking status: Former   Smokeless tobacco: Never  Scientific laboratory technician Use: Not on file  Substance and Sexual Activity   Alcohol use: Yes    Comment: rare   Drug use: Never   Sexual activity: Not on file  Other Topics Concern   Not on file  Social History Narrative   Not on file   Social Determinants of Health   Financial Resource Strain: Low Risk  (02/04/2018)   Overall Financial Resource Strain (CARDIA)    Difficulty of Paying Living Expenses: Not hard at all  Food Insecurity: No Food Insecurity (02/04/2018)   Hunger Vital Sign    Worried About Running Out of Food in the Last Year: Never true    [place] in the Last Year: Never true  Transportation Needs: No Transportation Needs (02/04/2018)   PRAPARE - Hydrologist (Medical): No    Lack of Transportation (Non-Medical): No  Physical Activity: Inactive (02/04/2018)   Exercise Vital Sign    Days of Exercise per Week: 0 days    Minutes of Exercise per Session: 0 min  Stress: No Stress Concern Present (02/04/2018)   [place]     Feeling of Stress : Only a little  Social Connections: Not on file    Allergies:  Allergies  Allergen Reactions   Other Shortness Of Breath    Esters   Robitussin Dm Max Day-Night Anaphylaxis and Swelling    Throat swelling   Banana Other (See Comments)    Severe indigestion   Cantaloupe (Diagnostic) Nausea Only   Contrast Media [Iodinated Contrast Media] Other (See Comments)    Just feel bad all over   Morphine And Related Other (See Comments)    Patient states he does not like to take Morphine as it gives him nightmares and hallucinations.     Objective:    Vital Signs:   Temp:  [97.1 F (36.2 C)-97.3 F (36.3 C)] 97.2 F (36.2 C) (01/26 1630) Pulse Rate:  [66-126] 76 (01/26 1630) Resp:  [16-25] 25 (01/26 1630) BP: (93-111)/(63-77) 111/63 (01/26 1630) SpO2:  [96 %-98 %] 98 % (01/26 1630) Weight:  [83.5 kg] 83.5 kg (01/26 0735)    Weight change: Filed Weights   07/10/2022 0735  Weight: 83.5 kg    Intake/Output:  No intake or output data in the 24 hours ending 07/24/2022 1642    Physical Exam    General:  elderly male, No respiratory difficulty HEENT: normal Neck: supple. JVD elevated to jaw. Carotids 2+ bilat; no bruits. No lymphadenopathy or thyromegaly appreciated. Cor: PMI nondisplaced. Regular rate & rhythm. No rubs, gallops or murmurs. Lungs: clear Abdomen: soft, nontender, nondistended. No hepatosplenomegaly. No bruits or masses. Good bowel sounds. Extremities: no cyanosis, clubbing, rash, 1+ b/l LE edema + left femoral IABP  Neuro: alert & oriented x 3, cranial nerves grossly intact. moves all 4 extremities w/o difficulty. Affect pleasant.   Telemetry   NSR 70s  EKG    07/22/22 NSR 1st degree HB, 73 bpm  Labs   Basic Metabolic Panel: Recent Labs  Lab 07/22/22 1441 07/23/2022 1359 07/01/2022 1413 07/30/2022 1418 07/07/2022 1419  NA 140 133* 134* 134* 137  K 5.5* 5.1 4.9 5.1 4.8  CL 104 106  --   --   --   CO2 20  --   --   --   --   GLUCOSE 125*  194*  --   --   --   BUN 41* 48*  --   --   --   CREATININE 2.29* 3.30*  --   --   --   CALCIUM 9.0  --   --   --   --     Liver Function Tests: No results for input(s): "AST", "ALT", "ALKPHOS", "BILITOT", "PROT", "ALBUMIN" in the last 168 hours. No results for input(s): "LIPASE", "AMYLASE" in the last 168 hours. No results for input(s): "AMMONIA" in the last 168 hours.  CBC: Recent Labs  Lab 07/01/2022 0815 07/16/2022 1359 07/15/2022 1413 07/18/2022 1418 07/20/2022 1419  WBC 6.8  --   --   --   --   HGB 10.5* 9.5* 9.2* 9.5* 9.2*  HCT 31.4* 28.0* 27.0* 28.0* 27.0*  MCV 105.0*  --   --   --   --   PLT 182  --   --   --   --     Cardiac Enzymes: No results for input(s): "CKTOTAL", "CKMB", "CKMBINDEX", "TROPONINI" in the last 168 hours.  BNP: BNP (last 3 results) Recent Labs    02/15/22 1407 02/18/22 0036  BNP 1,060.9* 623.5*    ProBNP (last 3 results) No results for input(s): "PROBNP" in the last 8760 hours.   CBG: Recent Labs  Lab 07/29/2022 1634  GLUCAP 172*    Coagulation Studies: No results for input(s): "LABPROT", "INR" in the last 72 hours.   Imaging   Port CXR  Result Date: 07/06/2022 CLINICAL DATA:  [id number] Encounter for central line placement [id number] EXAM: PORTABLE CHEST 1 VIEW COMPARISON:  February 15, 2022 FINDINGS: The cardiomediastinal silhouette is unchanged in contour.Lower extremity approach central line tip terminates over the expected LEFT lower lobe segmental to subsegmental pulmonary artery. IABP metallic marker projects at the approximate T6 level just inferior to the aortic arch. Favored small RIGHT pleural effusion. No pneumothorax. Patchy bibasilar opacities with diffuse interstitial prominence and bronchial wall thickening. IMPRESSION: 1. Lower extremity approach central line tip terminates over the expected LEFT lower lobe segmental to subsegmental pulmonary artery. 2.  Favored pulmonary edema with scattered bibasilar atelectasis Electronically Signed    By: Valentino Saxon M.D.   On: 07/07/2022 16:19     Medications:     Current Medications:  [START ON 07/26/2022] aspirin EC  81 mg Oral Daily   atorvastatin  40 mg Oral QHS   Chlorhexidine Gluconate Cloth  6 each Topical Daily   [START ON 07/26/2022] cholecalciferol  2,000 Units Oral Q1200   [START ON 07/26/2022] clopidogrel  75 mg Oral Daily   [START ON 07/26/2022] cyanocobalamin  5,000 mcg Oral Q1200   sodium chloride flush  3 mL Intravenous Q12H    Infusions:  sodium chloride     sodium chloride     sodium chloride     sodium chloride     sodium chloride     milrinone        Patient Profile   Paul Kramer is a 89 y.o. male with chronic  HFrEF due to ICM, CAD s/p fairly recent complex high risk PCI to LM-LAD-LCx 01/2022, known CTO of m-dRCA, PAF on Eliquis, HTN, HLD, TIA and CKD IV.  Presented for elective R/LHC in setting of recurrent unstable angina and exertional dyspnea. R/LHC showed severe in-stent restenosis of the bifurcation LM-LCx-LAD stents, elevated R+ L filling pressures and low output w/ CI 1.6. IABP placed. Admitted to ICU. AHF team asked to further assist.    Assessment/Plan   1. Acute on Chronic Systolic Heart Failure>>Cardiogenic Shock  - ischemic CM, MVCAD w/ LM-LAD-LCx bifurcation PCI < 6 months ago - Echo 8/23 EF 45-50%, RV ok  - EF now severely reduced by LVG in setting of severe in-stent restenosis LM-LAD-LCx bifrucation - RHC w/ CI 1.6,  PCWP 36 mmHg  - Support w/ IABP - Add Milrinone 0.25. Check Co-ox - Obtain 2D Echo  - begin diuresis w/ IV Lasix, 80 mg bid  - Follow swan hemodynamics closely  - no GDMT w/ shock and AKI on CKD IV   2. Multivessel CAD  - known CTO of m-dRCA w/ L>>R collaterals, s/p prior p-mLAD and m-dLCX PCI - recent  LM-LAD-LCx bifurcation PCI 8/23 - now w/ ISR of LM-LAD-LCx bifurcation - not candidate for CABG given age and CKD IV, deferring high risk PCI until optimized from HF/ renal standpoint - ASA, Plavix and statin per  IC team  - no ? blocker w/ shock  - Cont IABP to help w/ coronary perfusion  - start heparin gtt   3. AKI on CKD IV  - b/l SCr 1.7-2.2.  - SCr 3.30 on admit, minimal contrast used for cath <15 mL - likely cardiorenal from low-output HF - support CO w/ IABP and milrinone  - aim to keep SBP > 110, may need addition of NE to maintain    4. PAF - in NSR - will prophylacticaly start IV amio while on milrinone  - hold Eliquis. Cover w/ heparin gtt    Length of Stay: 0  Lyda Jester, PA-C  07/29/2022, 4:42 PM  Advanced Heart Failure Team Pager [phone] (M-F; 7a - 5p)  Please contact [facility] for night-coverage after hours (4p -7a ) and weekends on [email or web address]

## 2022-07-25 NOTE — Progress Notes (Addendum)
[place] for Heparin Indication: chest pain/ACS  Allergies  Allergen Reactions   Other Shortness Of Breath    Esters   Robitussin Dm Max Day-Night Anaphylaxis and Swelling    Throat swelling   Banana Other (See Comments)    Severe indigestion   Cantaloupe (Diagnostic) Nausea Only   Contrast Media [Iodinated Contrast Media] Other (See Comments)    Just feel bad all over   Morphine And Related Other (See Comments)    Patient states he does not like to take Morphine as it gives him nightmares and hallucinations.     Patient Measurements: Height: '5\' 5"'$  (165.1 cm) Weight: 83.5 kg (184 lb) IBW/kg (Calculated) : 61.5 Heparin Dosing Weight: 79 kg  Vital Signs: Temp: 97.5 F (36.4 C) (01/26 2130) Temp Source: Core (01/26 2000) BP: 103/70 (01/26 2130) Pulse Rate: 196 (01/26 2130)  Labs: Recent Labs    07/26/2022 0815 07/24/2022 1359 07/10/2022 1413 07/20/2022 1418 07/26/2022 1419 07/23/2022 1612  HGB 10.5* 9.5* 9.2* 9.5* 9.2*  --   HCT 31.4* 28.0* 27.0* 28.0* 27.0*  --   PLT 182  --   --   --   --   --   CREATININE  --  3.30*  --   --   --  2.97*    Estimated Creatinine Clearance: 16.4 mL/min (A) (by C-G formula based on SCr of 2.97 mg/dL (H)).   Medical History: Past Medical History:  Diagnosis Date   First detected episode of atrial fibrillation ([place]) 11/29/2017   In setting of STEMI -- NO further documented episodes   Hypercholesteremia    Hypertension    Multiple vessel coronary artery disease    Cath 11/29/17 - 3 V CAD (CTO RCA), 95% m-d Cx (@ OM2 55%)-> PCI Cx-PTCA Om2; 85% p-m LAD => DES PCI ; 01/2022 -> LM<LAD-LCx 80%-80% - 95%, with patent LAD and LCx stents. => Staged atherectomy-shockwave bifurcation LM-LAD-LCx PCI.   Myocardial infarction of inferolateral wall ([place]) 11/2017   ? prior Infarct; - Cath 11/29/17 - 3 V CAD (CTO RCA), 95% m-d Cx (@ OM2 55%)-> PCI Cx-PTCA Om2; 85% p-m LAD => DES PCI    Narcolepsy    pt may pass out when  laughing   Spinal stenosis    TIA (transient ischemic attack)    Assessment: 53 YOM admitted for cardiogenic shock s/p IABP placement. Pharmacy consulted to start heparin 2 hours post band removal. Verified with RN - band removed at 2100. Not on anticoagulation PTA.  Goal of Therapy:  Heparin level 0.2-0.5 units/ml Monitor platelets by anticoagulation protocol: Yes   Plan:  Start heparin at 800 units/hr at 2300 Check 8hr heparin level at 0800 Monitor for signs/symptoms of bleeding  Merrilee Jansky, PharmD Clinical Pharmacist 07/17/2022,10:02 PM

## 2022-07-25 NOTE — Consult Note (Signed)
Nephrology Consult   Assessment/Recommendations:   AKI on CKD 3b -followed by Dr. Posey Pronto. Last known Cr 06/16/22 was 1.8, GFR 35 -AKI likely CRS/cardiogenic shock, potential for CIN however minimal contrast utilized -hopefully will have some recovery with inotropic support (milrinone) and lasix '80mg'$  IV BID.  -will obtain a renal ultrasound and UA w/ microscopy for completion's sake -Avoid nephrotoxic medications including NSAIDs and iodinated intravenous contrast exposure unless the latter is absolutely indicated.  Preferred narcotic agents for pain control are hydromorphone, fentanyl, and methadone. Morphine should not be used. Avoid Baclofen and avoid oral sodium phosphate and magnesium citrate based laxatives / bowel preps. Continue strict Input and Output monitoring. Will monitor the patient closely with you and intervene or adjust therapy as indicated by changes in clinical status/labs   Acute on chronic systolic CHF exacerbation, cardiogenic shock -supported with IABP+Milrinone per cardio -lasix '80mg'$  iv BID  Multivessel CAD -s/p LHC 1/26 -per cardiology  pAfib -on IV amio  Anemia -transfuse prn for hgb <7 -continue to trend   Recommendations conveyed to primary service.    [facility] 07/24/2022 5:09 PM   _____________________________________________________________________________________   History of Present Illness: Paul Kramer is a/an 89 y.o. male with a past medical history of CKD3b, HFrEF due to ICM, CAD, pAfib, HTN, HLD, TIA who presents to [facility] for elective LHC in the context of unstable angina and exertional dyspnea. Cath revealed severe in-stent stenosis and multivessel CAD. Underwent IABP with improvement in RH hemodynamics. Transferred to CICU for low output HF. ~12cc of contrast used during LHC. Started on lasix and milrinone now as well. Patient seen and examined in ICU. Patient reports feeling much better after his procedure. He  reports he was having a burning sensation in his chest and mid back prior to this which has improved. He otherwise currently denies any fevers, chills, chest pain, SOB.   Medications:  Current Facility-Administered Medications  Medication Dose Route Frequency Provider Last Rate Last Admin   0.9 %  sodium chloride infusion (Manually program via Guardrails IV Fluids)   Intravenous Continuous Leonie Man, MD       0.9 %  sodium chloride infusion (Manually program via Guardrails IV Fluids)   Intravenous PRN Leonie Man, MD       0.9 %  sodium chloride infusion   Intravenous Continuous Leonie Man, MD       0.9 %  sodium chloride infusion   Intravenous Continuous Leonie Man, MD       0.9 %  sodium chloride infusion   Intravenous Continuous Leonie Man, MD       0.9 %  sodium chloride infusion  250 mL Intravenous PRN Leonie Man, MD       acetaminophen (TYLENOL) tablet 500 mg  500 mg Oral Q6H PRN Leonie Man, MD       amiodarone (NEXTERONE PREMIX) 360-4.14 MG/200ML-% (1.8 mg/mL) IV infusion  60 mg/hr Intravenous Continuous Simmons, Brittainy M, PA-C       amiodarone (NEXTERONE PREMIX) 360-4.14 MG/200ML-% (1.8 mg/mL) IV infusion  30 mg/hr Intravenous Continuous Simmons, Brittainy M, PA-C       amiodarone (NEXTERONE) 1.8 mg/mL load via infusion 150 mg  150 mg Intravenous Once Sabharwal, Aditya, DO       Followed by   amiodarone (NEXTERONE PREMIX) 360-4.14 MG/200ML-% (1.8 mg/mL) IV infusion  60 mg/hr Intravenous Continuous Sabharwal, Aditya, DO       Followed by   amiodarone (NEXTERONE  PREMIX) 360-4.14 MG/200ML-% (1.8 mg/mL) IV infusion  30 mg/hr Intravenous Continuous Sabharwal, Aditya, DO       [START ON 07/26/2022] aspirin EC tablet 81 mg  81 mg Oral Daily Leonie Man, MD       atorvastatin (LIPITOR) tablet 40 mg  40 mg Oral QHS Leonie Man, MD       Chlorhexidine Gluconate Cloth 2 % PADS 6 each  6 each Topical Daily Leonie Man, MD   6 each at  07/03/2022 1636   [START ON 07/26/2022] cholecalciferol (VITAMIN D3) 25 MCG (1000 UNIT) tablet 2,000 Units  2,000 Units Oral Q1200 Leonie Man, MD       [START ON 07/26/2022] clopidogrel (PLAVIX) tablet 75 mg  75 mg Oral Daily Leonie Man, MD       [START ON 07/26/2022] cyanocobalamin (VITAMIN B12) tablet 5,000 mcg  5,000 mcg Oral Q1200 Leonie Man, MD       fluticasone [facility]) 50 MCG/ACT nasal spray 1 spray  1 spray Each Nare Daily PRN Leonie Man, MD       furosemide (LASIX) injection 80 mg  80 mg Intravenous BID Rosita Fire, Brittainy M, PA-C       hydrALAZINE (APRESOLINE) injection 10 mg  10 mg Intravenous Q20 Min PRN Leonie Man, MD       labetalol (NORMODYNE) injection 10 mg  10 mg Intravenous Q10 min PRN Leonie Man, MD       melatonin tablet 9 mg  9 mg Oral QHS PRN Leonie Man, MD       milrinone (PRIMACOR) 20 MG/100 ML (0.2 mg/mL) infusion  0.25 mcg/kg/min Intravenous Continuous Leonie Man, MD 6.26 mL/hr at 07/18/2022 1658 0.25 mcg/kg/min at 07/08/2022 1658   ondansetron (ZOFRAN) injection 4 mg  4 mg Intravenous Q6H PRN Leonie Man, MD       sodium chloride flush (NS) 0.9 % injection 3 mL  3 mL Intravenous Q12H Leonie Man, MD       sodium chloride flush (NS) 0.9 % injection 3 mL  3 mL Intravenous PRN Leonie Man, MD         ALLERGIES Other, Robitussin dm max day-night, Banana, Cantaloupe (diagnostic), Contrast media [iodinated contrast media], and Morphine and related  MEDICAL HISTORY Past Medical History:  Diagnosis Date   First detected episode of atrial fibrillation ([place]) 11/29/2017   In setting of STEMI -- NO further documented episodes   Hypercholesteremia    Hypertension    Multiple vessel coronary artery disease    Cath 11/29/17 - 3 V CAD (CTO RCA), 95% m-d Cx (@ OM2 55%)-> PCI Cx-PTCA Om2; 85% p-m LAD => DES PCI ; 01/2022 -> LM<LAD-LCx 80%-80% - 95%, with patent LAD and LCx stents. => Staged atherectomy-shockwave bifurcation  LM-LAD-LCx PCI.   Myocardial infarction of inferolateral wall ([place]) 11/2017   ? prior Infarct; - Cath 11/29/17 - 3 V CAD (CTO RCA), 95% m-d Cx (@ OM2 55%)-> PCI Cx-PTCA Om2; 85% p-m LAD => DES PCI    Narcolepsy    pt may pass out when laughing   Spinal stenosis    TIA (transient ischemic attack)      SOCIAL HISTORY Social History   Socioeconomic History   Marital status: Widowed    Spouse name: Not on file   Number of children: Not on file   Years of education: Not on file   Highest education level: Not on file  Occupational History  Occupation: retired  Tobacco Use   Smoking status: Former   Smokeless tobacco: Never  Scientific laboratory technician Use: Not on file  Substance and Sexual Activity   Alcohol use: Yes    Comment: rare   Drug use: Never   Sexual activity: Not on file  Other Topics Concern   Not on file  Social History Narrative   Not on file   Social Determinants of Health   Financial Resource Strain: Low Risk  (02/04/2018)   Overall Financial Resource Strain (CARDIA)    Difficulty of Paying Living Expenses: Not hard at all  Food Insecurity: No Food Insecurity (02/04/2018)   Hunger Vital Sign    Worried About Running Out of Food in the Last Year: Never true    [place] in the Last Year: Never true  Transportation Needs: No Transportation Needs (02/04/2018)   PRAPARE - Hydrologist (Medical): No    Lack of Transportation (Non-Medical): No  Physical Activity: Inactive (02/04/2018)   Exercise Vital Sign    Days of Exercise per Week: 0 days    Minutes of Exercise per Session: 0 min  Stress: No Stress Concern Present (02/04/2018)   [place]    Feeling of Stress : Only a little  Social Connections: Not on file  Intimate Partner Violence: Not on file     FAMILY HISTORY Family History  Family history unknown: Yes    Review of Systems: 12 systems reviewed Otherwise as  per HPI, all other systems reviewed and negative  Physical Exam: Vitals:   07/22/2022 1615 07/02/2022 1630  BP:  111/63  Pulse: 66 76  Resp: (!) 21 (!) 25  Temp: (!) 97.3 F (36.3 C) (!) 97.2 F (36.2 C)  SpO2: 98% 98%   No intake/output data recorded. No intake or output data in the 24 hours ending 07/20/2022 1709 General: NAD Neck: +JVD CV: regular rate, normal rhythm, no murmurs, no gallops, no rubs Lungs: clear to auscultation bilaterally, normal work of breathing Abd: soft, non-tender, non-distended Skin: no visible lesions or rashes Psych: alert, engaged, appropriate mood and affect Musculoskeletal: 1+ pitting edema b/l LE's, IABP left fem Neuro: normal speech, no gross focal deficits   Test Results Reviewed Lab Results  Component Value Date   NA 137 07/16/2022   K 4.8 07/14/2022   CL 106 07/03/2022   CO2 20 07/22/2022   BUN 48 (H) 07/16/2022   CREATININE 3.30 (H) 07/18/2022   CALCIUM 9.0 07/22/2022   ALBUMIN 4.0 02/15/2022   PHOS 3.7 12/04/2017     I have reviewed all relevant outside healthcare records related to the patient's kidney injury.

## 2022-07-25 NOTE — Progress Notes (Signed)
Client took '50mg'$  prednisone at 11pm on 1/25 and 0500 1/26

## 2022-07-26 ENCOUNTER — Inpatient Hospital Stay (HOSPITAL_COMMUNITY): Payer: Medicare Other

## 2022-07-26 DIAGNOSIS — I5043 Acute on chronic combined systolic (congestive) and diastolic (congestive) heart failure: Secondary | ICD-10-CM | POA: Diagnosis not present

## 2022-07-26 DIAGNOSIS — I5023 Acute on chronic systolic (congestive) heart failure: Secondary | ICD-10-CM | POA: Diagnosis not present

## 2022-07-26 DIAGNOSIS — R570 Cardiogenic shock: Secondary | ICD-10-CM | POA: Diagnosis not present

## 2022-07-26 LAB — BASIC METABOLIC PANEL
Anion gap: 11 (ref 5–15)
Anion gap: 12 (ref 5–15)
BUN: 66 mg/dL — ABNORMAL HIGH (ref 8–23)
BUN: 70 mg/dL — ABNORMAL HIGH (ref 8–23)
CO2: 18 mmol/L — ABNORMAL LOW (ref 22–32)
CO2: 20 mmol/L — ABNORMAL LOW (ref 22–32)
Calcium: 7.9 mg/dL — ABNORMAL LOW (ref 8.9–10.3)
Calcium: 8 mg/dL — ABNORMAL LOW (ref 8.9–10.3)
Chloride: 103 mmol/L (ref 98–111)
Chloride: 100 mmol/L (ref 98–111)
Creatinine, Ser: 3.1 mg/dL — ABNORMAL HIGH (ref 0.61–1.24)
Creatinine, Ser: 3.23 mg/dL — ABNORMAL HIGH (ref 0.61–1.24)
GFR, Estimated: 18 mL/min — ABNORMAL LOW (ref >60)
GFR, Estimated: 18 mL/min — ABNORMAL LOW (ref >60)
Glucose, Bld: 202 mg/dL — ABNORMAL HIGH (ref 70–99)
Glucose, Bld: 244 mg/dL — ABNORMAL HIGH (ref 70–99)
Potassium: 4.6 mmol/L (ref 3.5–5.1)
Potassium: 4.3 mmol/L (ref 3.5–5.1)
Sodium: 132 mmol/L — ABNORMAL LOW (ref 135–145)
Sodium: 132 mmol/L — ABNORMAL LOW (ref 135–145)

## 2022-07-26 LAB — COOXEMETRY PANEL
Carboxyhemoglobin: 1.6 % — ABNORMAL HIGH (ref 0.5–1.5)
Carboxyhemoglobin: 0.9 % (ref 0.5–1.5)
Carboxyhemoglobin: 0.4 % — ABNORMAL LOW (ref 0.5–1.5)
Methemoglobin: <0.7 % (ref 0.0–1.5)
Methemoglobin: <0.7 % (ref 0.0–1.5)
Methemoglobin: 1.3 % (ref 0.0–1.5)
O2 Saturation: 69.6 %
O2 Saturation: 55.2 %
O2 Saturation: 53.9 %
Total hemoglobin: 9.5 g/dL — ABNORMAL LOW (ref 12.0–16.0)
Total hemoglobin: 9.2 g/dL — ABNORMAL LOW (ref 12.0–16.0)
Total hemoglobin: 9.7 g/dL — ABNORMAL LOW (ref 12.0–16.0)

## 2022-07-26 LAB — CBC
HCT: 26.4 % — ABNORMAL LOW (ref 39.0–52.0)
Hemoglobin: 8.7 g/dL — ABNORMAL LOW (ref 13.0–17.0)
MCH: 34.8 pg — ABNORMAL HIGH (ref 26.0–34.0)
MCHC: 33 g/dL (ref 30.0–36.0)
MCV: 105.6 fL — ABNORMAL HIGH (ref 80.0–100.0)
Platelets: 171 10*3/uL (ref 150–400)
RBC: 2.5 MIL/uL — ABNORMAL LOW (ref 4.22–5.81)
RDW: 12.6 % (ref 11.5–15.5)
WBC: 11 10*3/uL — ABNORMAL HIGH (ref 4.0–10.5)
nRBC: 0 % (ref 0.0–0.2)

## 2022-07-26 LAB — GLUCOSE, CAPILLARY
Glucose-Capillary: 176 mg/dL — ABNORMAL HIGH (ref 70–99)
Glucose-Capillary: 187 mg/dL — ABNORMAL HIGH (ref 70–99)
Glucose-Capillary: 209 mg/dL — ABNORMAL HIGH (ref 70–99)
Glucose-Capillary: 217 mg/dL — ABNORMAL HIGH (ref 70–99)
Glucose-Capillary: 221 mg/dL — ABNORMAL HIGH (ref 70–99)
Glucose-Capillary: 232 mg/dL — ABNORMAL HIGH (ref 70–99)
Glucose-Capillary: 249 mg/dL — ABNORMAL HIGH (ref 70–99)

## 2022-07-26 LAB — ECHOCARDIOGRAM COMPLETE
AR max vel: 1.28 cm2
AV Area VTI: 1.38 cm2
AV Area mean vel: 1.28 cm2
AV Mean grad: 9 mmHg
AV Peak grad: 19.2 mmHg
Ao pk vel: 2.19 m/s
Area-P 1/2: 2.95 cm2
Height: 65 in
MV M vel: 4.14 m/s
MV Peak grad: 68.6 mmHg
P 1/2 time: 267 msec
Radius: 0.4 cm
S' Lateral: 4.8 cm
Weight: 3135.82 oz

## 2022-07-26 LAB — BLOOD GAS, VENOUS
Acid-base deficit: 6 mmol/L — ABNORMAL HIGH (ref 0.0–2.0)
Bicarbonate: 18.8 mmol/L — ABNORMAL LOW (ref 20.0–28.0)
O2 Saturation: 55.5 %
Patient temperature: 37
pCO2, Ven: 34 mmHg — ABNORMAL LOW (ref 44–60)
pH, Ven: 7.35 (ref 7.25–7.43)
pO2, Ven: 35 mmHg (ref 32–45)

## 2022-07-26 LAB — HEPARIN LEVEL (UNFRACTIONATED)
Heparin Unfractionated: 0.2 IU/mL — ABNORMAL LOW (ref 0.30–0.70)
Heparin Unfractionated: 0.22 IU/mL — ABNORMAL LOW (ref 0.30–0.70)

## 2022-07-26 LAB — MAGNESIUM: Magnesium: 2.3 mg/dL (ref 1.7–2.4)

## 2022-07-26 MED ORDER — NOREPINEPHRINE 4 MG/250ML-% IV SOLN
0.0000 ug/min | INTRAVENOUS | Status: DC
  Filled 2022-07-26: qty 250

## 2022-07-26 MED ORDER — SODIUM CHLORIDE 0.9% FLUSH: 10.0000 mL | INTRAVENOUS | Status: DC | PRN

## 2022-07-26 MED ORDER — NOREPINEPHRINE 16 MG/250ML-% IV SOLN
0.0000 ug/min | INTRAVENOUS | Status: DC
  Administered 2022-07-26: 1 ug/min via INTRAVENOUS
  Filled 2022-07-26: qty 250

## 2022-07-26 MED ORDER — SODIUM CHLORIDE 0.9% FLUSH
10.0000 mL | Freq: Two times a day (BID) | INTRAVENOUS | Status: DC
  Administered 2022-07-26 (×2): 10 mL
  Administered 2022-07-27: 30 mL

## 2022-07-26 MED ORDER — INSULIN ASPART 100 UNIT/ML IJ SOLN
0.0000 [IU] | INTRAMUSCULAR | Status: DC
  Administered 2022-07-26 – 2022-07-27 (×4): 5 [IU] via SUBCUTANEOUS
  Administered 2022-07-27 (×2): 3 [IU] via SUBCUTANEOUS

## 2022-07-26 MED ORDER — METOLAZONE 2.5 MG PO TABS
2.5000 mg | ORAL_TABLET | Freq: Once | ORAL | Status: AC
  Administered 2022-07-26: 2.5 mg via ORAL
  Filled 2022-07-26: qty 1

## 2022-07-26 MED ORDER — EPINEPHRINE HCL 5 MG/250ML IV SOLN IN NS
2.0000 ug/min | INTRAVENOUS | Status: DC
  Administered 2022-07-26: 2 ug/min via INTRAVENOUS
  Filled 2022-07-26: qty 250

## 2022-07-26 MED ORDER — FUROSEMIDE 10 MG/ML IJ SOLN
80.0000 mg | Freq: Once | INTRAMUSCULAR | Status: AC
  Administered 2022-07-26: 80 mg via INTRAVENOUS
  Filled 2022-07-26: qty 8

## 2022-07-26 MED ORDER — SENNA 8.6 MG PO TABS: 1.0000 | ORAL_TABLET | Freq: Every day | ORAL | Status: DC

## 2022-07-26 MED ORDER — SENNOSIDES-DOCUSATE SODIUM 8.6-50 MG PO TABS
1.0000 | ORAL_TABLET | Freq: Every day | ORAL | Status: DC
  Administered 2022-07-26: 1 via ORAL
  Filled 2022-07-26: qty 1

## 2022-07-26 MED ORDER — SODIUM CHLORIDE 0.9 % IV SOLN
2.0000 g | INTRAVENOUS | Status: DC
  Administered 2022-07-26 – 2022-07-27 (×2): 2 g via INTRAVENOUS
  Filled 2022-07-26 (×2): qty 20

## 2022-07-26 NOTE — Progress Notes (Signed)
[place] for Heparin Indication: chest pain/ACS  Allergies  Allergen Reactions   Other Shortness Of Breath    Esters   Robitussin Dm Max Day-Night Anaphylaxis and Swelling    Throat swelling   Banana Other (See Comments)    Severe indigestion   Cantaloupe (Diagnostic) Nausea Only   Contrast Media [Iodinated Contrast Media] Other (See Comments)    Just feel bad all over   Morphine And Related Other (See Comments)    Patient states he does not like to take Morphine as it gives him nightmares and hallucinations.     Patient Measurements: Height: '5\' 5"'$  (165.1 cm) Weight: 88.9 kg (195 lb 15.8 oz) IBW/kg (Calculated) : 61.5 Heparin Dosing Weight: 79 kg  Vital Signs: Temp: 98.4 F (36.9 C) (01/27 1745) BP: 93/44 (01/27 1745) Pulse Rate: 161 (01/27 1745)  Labs: Recent Labs    07/12/2022 0815 07/28/2022 1359 07/17/2022 1418 07/22/2022 1419 07/01/2022 1612 07/26/22 0431 07/26/22 0930 07/26/22 1522 07/26/22 1747  HGB 10.5*   < > 9.5* 9.2*  --  8.7*  --   --   --   HCT 31.4*   < > 28.0* 27.0*  --  26.4*  --   --   --   PLT 182  --   --   --   --  171  --   --   --   HEPARINUNFRC  --   --   --   --   --   --  0.20*  --  0.22*  CREATININE  --    < >  --   --  2.97* 3.10*  --  3.23*  --    < > = values in this interval not displayed.     Estimated Creatinine Clearance: 15.6 mL/min (A) (by C-G formula based on SCr of 3.23 mg/dL (H)).   Medical History: Past Medical History:  Diagnosis Date   First detected episode of atrial fibrillation ([place]) 11/29/2017   In setting of STEMI -- NO further documented episodes   Hypercholesteremia    Hypertension    Multiple vessel coronary artery disease    Cath 11/29/17 - 3 V CAD (CTO RCA), 95% m-d Cx (@ OM2 55%)-> PCI Cx-PTCA Om2; 85% p-m LAD => DES PCI ; 01/2022 -> LM<LAD-LCx 80%-80% - 95%, with patent LAD and LCx stents. => Staged atherectomy-shockwave bifurcation LM-LAD-LCx PCI.   Myocardial infarction of  inferolateral wall ([place]) 11/2017   ? prior Infarct; - Cath 11/29/17 - 3 V CAD (CTO RCA), 95% m-d Cx (@ OM2 55%)-> PCI Cx-PTCA Om2; 85% p-m LAD => DES PCI    Narcolepsy    pt may pass out when laughing   Spinal stenosis    TIA (transient ischemic attack)    Assessment: 30 YOM admitted for cardiogenic shock s/p IABP placement. Pharmacy consulted for heparin.  Given his significant coronary disease, heparin goal is 0.3-0.5.  Goal of Therapy:  Heparin level 0.3-0.5 units/ml Monitor platelets by anticoagulation protocol: Yes   Plan:  -Increase heparin to 1050 units/hr -heparin level and CBC in am  Hildred Laser, PharmD Clinical Pharmacist **Pharmacist phone directory can now be found on [email or web address] ([email or web address]).  Listed under [place].

## 2022-07-26 NOTE — Progress Notes (Signed)
  Echocardiogram 2D Echocardiogram has been performed.  Paul Kramer 07/26/2022, 4:08 PM

## 2022-07-26 NOTE — Progress Notes (Signed)
[place] for Heparin Indication: chest pain/ACS  Allergies  Allergen Reactions   Other Shortness Of Breath    Esters   Robitussin Dm Max Day-Night Anaphylaxis and Swelling    Throat swelling   Banana Other (See Comments)    Severe indigestion   Cantaloupe (Diagnostic) Nausea Only   Contrast Media [Iodinated Contrast Media] Other (See Comments)    Just feel bad all over   Morphine And Related Other (See Comments)    Patient states he does not like to take Morphine as it gives him nightmares and hallucinations.     Patient Measurements: Height: '5\' 5"'$  (165.1 cm) Weight: 88.9 kg (195 lb 15.8 oz) IBW/kg (Calculated) : 61.5 Heparin Dosing Weight: 79 kg  Vital Signs: Temp: 97.3 F (36.3 C) (01/27 0915) Temp Source: Core (01/27 0400) BP: 136/48 (01/27 0915) Pulse Rate: 214 (01/27 0915)  Labs: Recent Labs    07/21/2022 0815 07/01/2022 1359 07/19/2022 1413 07/11/2022 1418 07/21/2022 1419 07/13/2022 1612 07/26/22 0431 07/26/22 0930  HGB 10.5* 9.5*   < > 9.5* 9.2*  --  8.7*  --   HCT 31.4* 28.0*   < > 28.0* 27.0*  --  26.4*  --   PLT 182  --   --   --   --   --  171  --   HEPARINUNFRC  --   --   --   --   --   --   --  0.20*  CREATININE  --  3.30*  --   --   --  2.97* 3.10*  --    < > = values in this interval not displayed.     Estimated Creatinine Clearance: 16.2 mL/min (A) (by C-G formula based on SCr of 3.1 mg/dL (H)).   Medical History: Past Medical History:  Diagnosis Date   First detected episode of atrial fibrillation ([place]) 11/29/2017   In setting of STEMI -- NO further documented episodes   Hypercholesteremia    Hypertension    Multiple vessel coronary artery disease    Cath 11/29/17 - 3 V CAD (CTO RCA), 95% m-d Cx (@ OM2 55%)-> PCI Cx-PTCA Om2; 85% p-m LAD => DES PCI ; 01/2022 -> LM<LAD-LCx 80%-80% - 95%, with patent LAD and LCx stents. => Staged atherectomy-shockwave bifurcation LM-LAD-LCx PCI.   Myocardial infarction of  inferolateral wall ([place]) 11/2017   ? prior Infarct; - Cath 11/29/17 - 3 V CAD (CTO RCA), 95% m-d Cx (@ OM2 55%)-> PCI Cx-PTCA Om2; 85% p-m LAD => DES PCI    Narcolepsy    pt may pass out when laughing   Spinal stenosis    TIA (transient ischemic attack)    Assessment: 60 YOM admitted for cardiogenic shock s/p IABP placement. Pharmacy consulted for heparin. No bleeding per RN. Hgb 8.7, PLTc 171, both stable. Given his significant coronary disease, will adjust goal to 0.3-0.5.  Goal of Therapy:  Heparin level 0.3-0.5 units/ml Monitor platelets by anticoagulation protocol: Yes   Plan:  Increase heparin to 900 units/hr  Check 8hr heparin level Monitor for signs/symptoms of bleeding  Thank you for involving pharmacy in this patient's care.  Reatha Harps, PharmD PGY2 Pharmacy Resident 07/26/2022 10:13 AM

## 2022-07-26 NOTE — Progress Notes (Signed)
Dani Gobble RN at bedside aware PICC ready to use and to remove all PIV's not necessary.

## 2022-07-26 NOTE — Progress Notes (Signed)
Peripherally Inserted Central Catheter Placement  The IV Nurse has discussed with the patient and/or persons authorized to consent for the patient, the purpose of this procedure and the potential benefits and risks involved with this procedure.  The benefits include less needle sticks, lab draws from the catheter, and the patient may be discharged home with the catheter. Risks include, but not limited to, infection, bleeding, blood clot (thrombus formation), and puncture of an artery; nerve damage and irregular heartbeat and possibility to perform a PICC exchange if needed/ordered by physician.  Alternatives to this procedure were also discussed.  Bard Power PICC patient education guide, fact sheet on infection prevention and patient information card has been provided to patient /or left at bedside.    PICC Placement Documentation  PICC Triple Lumen 07/26/22 Right Brachial 39 cm 0 cm (Active)  Indication for Insertion or Continuance of Line Vasoactive infusions 07/26/22 0937  Exposed Catheter (cm) 0 cm 07/26/22 0937  Site Assessment Clean, Dry, Intact 07/26/22 0937  Lumen #1 Status Flushed;Saline locked;Blood return noted 07/26/22 0937  Lumen #2 Status Flushed;Saline locked;Blood return noted 07/26/22 0937  Lumen #3 Status Flushed;Saline locked;Blood return noted 07/26/22 0937  Dressing Type Transparent;Securing device 07/26/22 0937  Dressing Status Antimicrobial disc in place;Clean, Dry, Intact 07/26/22 0937  Safety Lock Not Applicable 99/77/41 4239  Line Care Connections checked and tightened 07/26/22 0937  Line Adjustment (NICU/IV Team Only) No 07/26/22 0937  Dressing Intervention New dressing 07/26/22 0937  Dressing Change Due 08/02/22 07/26/22 [place]       Rolena Infante 07/26/2022, 9:37 AM

## 2022-07-26 NOTE — Progress Notes (Signed)
Dr Candiss Norse nephrology approves of PICC placement.

## 2022-07-26 NOTE — Consult Note (Signed)
Advanced Heart Failure Team Consult Note   Primary Physician: Lajean Manes, MD PCP-Cardiologist:  Glenetta Hew, MD  Reason for Consultation: Acute on Chronic Systolic Heart Failure>>Shock Due to ICM   Subjective / Interval hx:   Paul Kramer is a pleasant 89 YO WM w/ ischemic cardiomyopathy, HFrEF (LVEF 45%), CKD (baseline sCr 1.7-2), pAF and mutlivessel CAD w/ known CTO of the distal RCA filled by left to right collaterals admitted directly from the cath lab for cardiogenic shock and severe LM/Lcx stenosis. Paul Kramer has been followed by Dr. Ellyn Hack for several years now. In August 2023, he underwent complex / high risk PCI to the LM-LAD-Lcx with excellent results. After PCI he was very functional; able to walk and bike several miles without much difficulty. Today due to recurrent angina, he presented for LHC. Cath was significant for severe 99% LM->Lcx instent stenosis. HF was called to bedside for admission.    RHC hemodynamics: RA 17, RV 61/32, PA 63/35(48), PCWP 33 w/ 59mHg V waves & Fick CI of 1.6, PAPi 1.5.  After discussion with Dr. HEllyn Hack the decision was made to place an IABP for support; impella placement would be difficult due to peripheral vascular disease and risk of limb malperfusion. After IABP placement, Paul Kramer had improvement in PA pressures to 40s-50s/30s. The decision was made to defer PCI until medical optimization.   This AM  - CVP 10-12, PCWP ~234mg - MAP 60 on vasopressin 0.04 and milrinone 0.2535mkg/min - Awake/alert no complaints this AM.  Objective:    Vital Signs:   Temp:  [96.8 F (36 C)-97.7 F (36.5 C)] 97 F (36.1 C) (01/27 0815) Pulse Rate:  [65-229] 180 (01/27 0815) Resp:  [10-28] 21 (01/27 0815) BP: (79-131)/(38-98) 103/51 (01/27 0815) SpO2:  [88 %-98 %] 94 % (01/27 0815) Weight:  [88.9 kg] 88.9 kg (01/27 0500) Last BM Date :  (PTA)  Weight change: Filed Weights   07/01/2022 0735 07/26/22 0500  Weight: 83.5 kg 88.9 kg     Intake/Output:   Intake/Output Summary (Last 24 hours) at 07/26/2022 0841 Last data filed at 07/26/2022 0700 Gross per 24 hour  Intake 1345.74 ml  Output 590 ml  Net 755.74 ml      Physical Exam    General:  elderly WM; laying comfortably in bed.  HEENT: normal Neck: supple. JVD elevated to jaw. Carotids 2+ bilat; no bruits. No lymphadenopathy or thyromegaly appreciated. Cor: PMI nondisplaced. Regular rate & rhythm. No rubs, gallops or murmurs. Lungs: clear Abdomen: soft, nontender, nondistended. No hepatosplenomegaly. No bruits or masses. Good bowel sounds. Extremities: warm to touch; right femoral swan and IABP in place; good distal pulses.  Neuro: alert & oriented x 3, cranial nerves grossly intact. moves all 4 extremities w/o difficulty. Affect pleasant.   Telemetry   NSR 60s-70s.   EKG    07/22/22 NSR 1st degree HB, 73 bpm  Labs   Basic Metabolic Panel: Recent Labs  Lab 07/22/22 1441 07/22/22 1441 07/18/2022 1359 07/14/2022 1413 07/28/2022 1418 07/29/2022 1419 07/26/2022 1612 07/26/22 0431  NA 140   < > 133* 134* 134* 137 133* 132*  K 5.5*  --  5.1 4.9 5.1 4.8 4.9 4.6  CL 104  --  106  --   --   --  104 103  CO2 20  --   --   --   --   --  20* 18*  GLUCOSE 125*  --  194*  --   --   --  190* 202*  BUN 41*  --  48*  --   --   --  55* 66*  CREATININE 2.29*  --  3.30*  --   --   --  2.97* 3.10*  CALCIUM 9.0  --   --   --   --   --  8.2* 7.9*  MG  --   --   --   --   --   --   --  2.3   < > = values in this interval not displayed.     Liver Function Tests: No results for input(s): "AST", "ALT", "ALKPHOS", "BILITOT", "PROT", "ALBUMIN" in the last 168 hours. No results for input(s): "LIPASE", "AMYLASE" in the last 168 hours. No results for input(s): "AMMONIA" in the last 168 hours.  CBC: Recent Labs  Lab 07/24/2022 0815 07/01/2022 1359 07/07/2022 1413 07/26/2022 1418 07/15/2022 1419 07/26/22 0431  WBC 6.8  --   --   --   --  11.0*  HGB 10.5* 9.5* 9.2* 9.5* 9.2*  8.7*  HCT 31.4* 28.0* 27.0* 28.0* 27.0* 26.4*  MCV 105.0*  --   --   --   --  105.6*  PLT 182  --   --   --   --  171     Cardiac Enzymes: No results for input(s): "CKTOTAL", "CKMB", "CKMBINDEX", "TROPONINI" in the last 168 hours.  BNP: BNP (last 3 results) Recent Labs    02/15/22 1407 02/18/22 0036  BNP 1,060.9* 623.5*     ProBNP (last 3 results) No results for input(s): "PROBNP" in the last 8760 hours.   CBG: Recent Labs  Lab 07/08/2022 1634 07/24/2022 1926 07/26/22 0005 07/26/22 0409  GLUCAP 172* 207* 176* 187*     Coagulation Studies: No results for input(s): "LABPROT", "INR" in the last 72 hours.   Imaging   US RENAL  Result Date: 07/13/2022 CLINICAL DATA:  Acute renal injury EXAM: RENAL / URINARY TRACT ULTRASOUND COMPLETE COMPARISON:  12/03/2017 FINDINGS: Right Kidney: Renal measurements: 10.6 x 5.1 x 4.9 cm. = volume: 137 mL. No mass lesion or hydronephrosis is noted. Echogenicity is within normal limits. Nonobstructing calculus is noted in the lower pole measuring up to 6 mm. Left Kidney: Renal measurements: 11.0 x 6.4 x 5.0 cm. = volume: 182 mL. 3.9 cm cyst Is again noted in the midportion of the left kidney similar to that seen on prior exam. 1.6 cm nonobstructing stone is noted within the left kidney. Bladder: Decompressed by Foley catheter. Other: Large bilateral pleural effusions are seen. Splenic granulomata are noted. IMPRESSION: Large bilateral pleural effusions. Left renal cyst similar to that seen on prior exam. Bilateral nonobstructing renal calculi. Electronically Signed   By: Inez Catalina M.D.   On: 07/09/2022 22:19   [place] EKG SITE RITE  Result Date: 07/21/2022 If Site Rite image not attached, placement could not be confirmed due to current cardiac rhythm.  CARDIAC CATHETERIZATION  Result Date: 07/28/2022   Mid LM to Dist LM lesion is 95% stenosed with 95% stenosed side branch in Ost Cx.   Dist LM to Ost LAD lesion is 95% stenosed.   Prox LAD-1 stent  is widely patent  Prox LAD-2 lesion is 20% stenosed. Mid LAD-1 lesion is 30% stenosed. Mid LAD-2 lesion is 50% stenosed.   Mid Cx to Dist Cx stent is widely patent with 20% stenosed side branch in Ost 2nd Mrg   RCA not injected, known to be occluded.   Hemodynamic findings consistent with Severe  Pulmonary Hypertension (mean PAP 46 mmHg).  LV End Diastolic Pressure is severely elevated -> 36 to 38 mmHg.   Severely reduced Cardiac Output/Index and PAPI.   There is no aortic valve stenosis. POSTPROCEDURE DIAGNOSIS Severe in-stent restenosis of the bifurcation LM-LCx-LAD stents at the bifurcation only. (99%); known occlusion of the RCA Coronary angiography performed with <15 mL IV contrast Severe ischemic cardiomyopathy with cardiac index of 1161 according to output of 3.07 by Fick which correlates with pression of renal failure (creatinine on that was up to 3 3) Severe Secondary Pulmonary (Venous) Hypertension with mean PAP of 46 mmHg and PCWP of 36 mm, and LVEDP of 24 mmHg. Successful IABP placement with notable improvement in right heart numbers. RECOMMENDATIONS The patient will be admitted Into the CVICU under the Care Of of the Advanced Heart Failure Service (Dr. Daniel Nones) with IABP and Swan-Ganz catheter in place. Plan will be inotropic support along with balloon pump to allow for diuresis" tune up prior to potentially planned high-pressure POBA of LM-LCx-LAD We have consulted Nephrology Glenetta Hew, MD  Port CXR  Result Date: 07/24/2022 CLINICAL DATA:  [id number] Encounter for central line placement [id number] EXAM: PORTABLE CHEST 1 VIEW COMPARISON:  February 15, 2022 FINDINGS: The cardiomediastinal silhouette is unchanged in contour.Lower extremity approach central line tip terminates over the expected LEFT lower lobe segmental to subsegmental pulmonary artery. IABP metallic marker projects at the approximate T6 level just inferior to the aortic arch. Favored small RIGHT pleural effusion. No pneumothorax. Patchy  bibasilar opacities with diffuse interstitial prominence and bronchial wall thickening. IMPRESSION: 1. Lower extremity approach central line tip terminates over the expected LEFT lower lobe segmental to subsegmental pulmonary artery. 2.  Favored pulmonary edema with scattered bibasilar atelectasis Electronically Signed   By: Valentino Saxon M.D.   On: 07/07/2022 16:19     Medications:     Current Medications:  aspirin EC  81 mg Oral Daily   atorvastatin  40 mg Oral QHS   Chlorhexidine Gluconate Cloth  6 each Topical Daily   cholecalciferol  2,000 Units Oral Q1200   clopidogrel  75 mg Oral Daily   cyanocobalamin  5,000 mcg Oral Q1200   furosemide  80 mg Intravenous BID   insulin aspart  0-9 Units Subcutaneous Q4H   metolazone  2.5 mg Oral Once   sodium chloride flush  3 mL Intravenous Q12H    Infusions:  sodium chloride     amiodarone 30 mg/hr (07/26/22 0700)   cefTRIAXone (ROCEPHIN)  IV     heparin 800 Units/hr (07/26/22 0700)   milrinone 0.25 mcg/kg/min (07/26/22 0700)   norepinephrine (LEVOPHED) Adult infusion     vasopressin 0.03 Units/min (07/26/22 0700)      Patient Profile   Paul Kramer is a 89 y.o. male with chronic HFrEF due to ICM, CAD s/p fairly recent complex high risk PCI to LM-LAD-LCx 01/2022, known CTO of m-dRCA, PAF on Eliquis, HTN, HLD, TIA and CKD IV.  Presented for elective R/LHC in setting of recurrent unstable angina and exertional dyspnea. R/LHC showed severe in-stent restenosis of the bifurcation LM-LCx-LAD stents, elevated R+ L filling pressures and low output w/ CI 1.6. IABP placed. Admitted to ICU. AHF team asked to further assist.    Assessment/Plan   1. Acute on Chronic Systolic Heart Failure>>Cardiogenic Shock  - ischemic CM, MVCAD w/ LM-LAD-LCx bifurcation PCI < 6 months ago - Echo 8/23 EF 45-50%, RV ok  - EF now severely reduced by LVG in setting of severe in-stent restenosis  LM-LAD-LCx bifrucation - RHC w/ CI 1.6,  PCWP 36 mmHg  - This AM,  patient on milrinone 0.70mg/kg/min, vasopressin 0.04; still hypotensive. Starting low dose levophed - Mixed venous 55, estimated Fick CI 2.5 L/min/m2. PCWP down to 25, however, urine output remains dismal. Continue IV lasix '80mg'$  BID + metolazone 2.5 once today.  - Starting rocephin for empiric coverage due to rising WBC ct.  - sCr up to 3.1.  - IABP advanced 2-3cm this AM; repeat CXR and TTE pending.   2. Multivessel CAD  - known CTO of m-dRCA w/ L>>R collaterals, s/p prior p-mLAD and m-dLCX PCI - recent  LM-LAD-LCx bifurcation PCI 8/23 - now w/ ISR of LM-LAD-LCx bifurcation - not candidate for CABG given age and CKD IV, deferring high risk PCI until optimized from HF/ renal standpoint - ASA, Plavix and statin - no ? blocker w/ shock  - Cont IABP to help w/ coronary perfusion  - hep gtt  3. AKI on CKD IV  - b/l SCr 1.7-2.2.  - SCr 3.30 on admit, minimal contrast used for cath <15 mL - likely cardiorenal from low-output HF - support CO w/ IABP and milrinone  - aim to keep SBP > 110, may need addition of NE to maintain  - sCr 3.1 today; likely 2/2 ATN.   4. PAF - in NSR - will prophylacticaly start IV amio while on milrinone  - hold Eliquis. Cover w/ heparin gtt    Length of Stay: 1[place] DO  07/26/2022, 8:41 AM  Advanced Heart Failure Team Pager [phone](M-F; 7a - 5p)  Please contact [facility] for night-coverage after hours (4p -7a ) and weekends on [email or web address]  CRITICAL CARE Performed by: AHebert Soho  Total critical care time: 35 minutes  Critical care time was exclusive of separately billable procedures and treating other patients.  Critical care was necessary to treat or prevent imminent or life-threatening deterioration.  Critical care was time spent personally by me on the following activities: development of treatment plan with patient and/or surrogate as well as nursing, discussions with consultants, evaluation of patient's response to  treatment, examination of patient, obtaining history from patient or surrogate, ordering and performing treatments and interventions, ordering and review of laboratory studies, ordering and review of radiographic studies, pulse oximetry and re-evaluation of patient's condition.

## 2022-07-26 NOTE — Progress Notes (Incomplete)
Per MD Candiss Norse, patient cleared to have PICC

## 2022-07-26 NOTE — Progress Notes (Addendum)
[facility] Progress Note    Assessment/ Plan:   AKI on CKD 3b -followed by Dr. Posey Pronto. Last known Cr 06/16/22 was 1.8, GFR 35 -AKI likely CRS/cardiogenic shock, potential for CIN however minimal contrast utilized. Renal u/s with nonobstructing stones otherwise unremarkable -hopefully will have some recovery with inotropic support (milrinone) and lasix '80mg'$  IV BID. Cr relatively stable at 3.1 today -No indications for renal replacement therapy at this junction. Age and other comorbidities may be a limiting factor for long term dialysis if it were to come to that. Briefly discussed this with patient and daughter, will advance conversations if necessary -Avoid nephrotoxic medications including NSAIDs and iodinated intravenous contrast exposure unless the latter is absolutely indicated.  Preferred narcotic agents for pain control are hydromorphone, fentanyl, and methadone. Morphine should not be used. Avoid Baclofen and avoid oral sodium phosphate and magnesium citrate based laxatives / bowel preps. Continue strict Input and Output monitoring. Will monitor the patient closely with you and intervene or adjust therapy as indicated by changes in clinical status/labs    Acute on chronic systolic CHF exacerbation, cardiogenic shock -supported with IABP+Milrinone per cardio -lasix '80mg'$  iv BID -on vaso currently   Multivessel CAD -s/p LHC 1/26 -per cardiology   pAfib -on IV amio   Anemia -transfuse prn for hgb <7 -trending downwards, 8.7 today. Per primary service  Addendum: Okay to place PICC for access.  Subjective:   Patient seen and examined bedside in ICU. Daughter at bedside, discussed. Now on vaso. UOP ~0.5L   Objective:   BP (!) 110/42   Pulse (!) 176   Temp (!) 96.8 F (36 C)   Resp 16   Ht '5\' 5"'$  (1.651 m)   Wt 88.9 kg   SpO2 93%   BMI 32.61 kg/m   Intake/Output Summary (Last 24 hours) at 07/26/2022 5188 Last data filed at 07/26/2022 0700 Gross per 24 hour   Intake 1345.74 ml  Output 590 ml  Net 755.74 ml   Weight change:   Physical Exam: Gen: elderly male, NAD CVS: RRR Resp: CTA b/l Abd: soft, nt/nd Ext:  no sig edema b/l Les, left fem IABP Neuro: awake, alert, hard of hearing  Imaging: US RENAL  Result Date: 07/12/2022 CLINICAL DATA:  Acute renal injury EXAM: RENAL / URINARY TRACT ULTRASOUND COMPLETE COMPARISON:  12/03/2017 FINDINGS: Right Kidney: Renal measurements: 10.6 x 5.1 x 4.9 cm. = volume: 137 mL. No mass lesion or hydronephrosis is noted. Echogenicity is within normal limits. Nonobstructing calculus is noted in the lower pole measuring up to 6 mm. Left Kidney: Renal measurements: 11.0 x 6.4 x 5.0 cm. = volume: 182 mL. 3.9 cm cyst Is again noted in the midportion of the left kidney similar to that seen on prior exam. 1.6 cm nonobstructing stone is noted within the left kidney. Bladder: Decompressed by Foley catheter. Other: Large bilateral pleural effusions are seen. Splenic granulomata are noted. IMPRESSION: Large bilateral pleural effusions. Left renal cyst similar to that seen on prior exam. Bilateral nonobstructing renal calculi. Electronically Signed   By: Inez Catalina M.D.   On: 07/02/2022 22:19   [place] EKG SITE RITE  Result Date: 07/17/2022 If Site Rite image not attached, placement could not be confirmed due to current cardiac rhythm.  CARDIAC CATHETERIZATION  Result Date: 07/24/2022   Mid LM to Dist LM lesion is 95% stenosed with 95% stenosed side branch in Ost Cx.   Dist LM to Ost LAD lesion is 95% stenosed.   Prox LAD-1  stent is widely patent  Prox LAD-2 lesion is 20% stenosed. Mid LAD-1 lesion is 30% stenosed. Mid LAD-2 lesion is 50% stenosed.   Mid Cx to Dist Cx stent is widely patent with 20% stenosed side branch in Ost 2nd Mrg   RCA not injected, known to be occluded.   Hemodynamic findings consistent with Severe Pulmonary Hypertension (mean PAP 46 mmHg).  LV End Diastolic Pressure is severely elevated -> 36 to 38 mmHg.    Severely reduced Cardiac Output/Index and PAPI.   There is no aortic valve stenosis. POSTPROCEDURE DIAGNOSIS Severe in-stent restenosis of the bifurcation LM-LCx-LAD stents at the bifurcation only. (99%); known occlusion of the RCA Coronary angiography performed with <15 mL IV contrast Severe ischemic cardiomyopathy with cardiac index of 1161 according to output of 3.07 by Fick which correlates with pression of renal failure (creatinine on that was up to 3 3) Severe Secondary Pulmonary (Venous) Hypertension with mean PAP of 46 mmHg and PCWP of 36 mm, and LVEDP of 24 mmHg. Successful IABP placement with notable improvement in right heart numbers. RECOMMENDATIONS The patient will be admitted Into the CVICU under the Care Of of the Advanced Heart Failure Service (Dr. Daniel Nones) with IABP and Swan-Ganz catheter in place. Plan will be inotropic support along with balloon pump to allow for diuresis" tune up prior to potentially planned high-pressure POBA of LM-LCx-LAD We have consulted Nephrology Glenetta Hew, MD  Port CXR  Result Date: 07/07/2022 CLINICAL DATA:  [id number] Encounter for central line placement [id number] EXAM: PORTABLE CHEST 1 VIEW COMPARISON:  February 15, 2022 FINDINGS: The cardiomediastinal silhouette is unchanged in contour.Lower extremity approach central line tip terminates over the expected LEFT lower lobe segmental to subsegmental pulmonary artery. IABP metallic marker projects at the approximate T6 level just inferior to the aortic arch. Favored small RIGHT pleural effusion. No pneumothorax. Patchy bibasilar opacities with diffuse interstitial prominence and bronchial wall thickening. IMPRESSION: 1. Lower extremity approach central line tip terminates over the expected LEFT lower lobe segmental to subsegmental pulmonary artery. 2.  Favored pulmonary edema with scattered bibasilar atelectasis Electronically Signed   By: Valentino Saxon M.D.   On: 07/19/2022 16:19    Labs: BMET Recent Labs  Lab  07/22/22 1441 07/02/2022 1359 07/04/2022 1413 07/14/2022 1418 07/17/2022 1419 07/19/2022 1612 07/26/22 0431  NA 140 133* 134* 134* 137 133* 132*  K 5.5* 5.1 4.9 5.1 4.8 4.9 4.6  CL 104 106  --   --   --  104 103  CO2 20  --   --   --   --  20* 18*  GLUCOSE 125* 194*  --   --   --  190* 202*  BUN 41* 48*  --   --   --  55* 66*  CREATININE 2.29* 3.30*  --   --   --  2.97* 3.10*  CALCIUM 9.0  --   --   --   --  8.2* 7.9*   CBC Recent Labs  Lab 07/30/2022 0815 07/08/2022 1359 07/24/2022 1413 07/21/2022 1418 07/09/2022 1419 07/26/22 0431  WBC 6.8  --   --   --   --  11.0*  HGB 10.5*   < > 9.2* 9.5* 9.2* 8.7*  HCT 31.4*   < > 27.0* 28.0* 27.0* 26.4*  MCV 105.0*  --   --   --   --  105.6*  PLT 182  --   --   --   --  171   < > =  values in this interval not displayed.    Medications:     aspirin EC  81 mg Oral Daily   atorvastatin  40 mg Oral QHS   Chlorhexidine Gluconate Cloth  6 each Topical Daily   cholecalciferol  2,000 Units Oral Q1200   clopidogrel  75 mg Oral Daily   cyanocobalamin  5,000 mcg Oral Q1200   furosemide  80 mg Intravenous BID   insulin aspart  0-9 Units Subcutaneous Q4H   sodium chloride flush  3 mL Intravenous Q12H      Gean Quint, MD [facility] 07/26/2022, 8:26 AM

## 2022-07-27 ENCOUNTER — Inpatient Hospital Stay (HOSPITAL_COMMUNITY): Payer: Medicare Other

## 2022-07-27 DIAGNOSIS — I5023 Acute on chronic systolic (congestive) heart failure: Secondary | ICD-10-CM | POA: Diagnosis not present

## 2022-07-27 DIAGNOSIS — R570 Cardiogenic shock: Secondary | ICD-10-CM | POA: Diagnosis not present

## 2022-07-27 LAB — COOXEMETRY PANEL
Carboxyhemoglobin: 1.2 % (ref 0.5–1.5)
Methemoglobin: <0.7 % (ref 0.0–1.5)
O2 Saturation: 58.3 %
Total hemoglobin: 9.1 g/dL — ABNORMAL LOW (ref 12.0–16.0)

## 2022-07-27 LAB — CBC
HCT: 27.5 % — ABNORMAL LOW (ref 39.0–52.0)
Hemoglobin: 9.3 g/dL — ABNORMAL LOW (ref 13.0–17.0)
MCH: 35.4 pg — ABNORMAL HIGH (ref 26.0–34.0)
MCHC: 33.8 g/dL (ref 30.0–36.0)
MCV: 104.6 fL — ABNORMAL HIGH (ref 80.0–100.0)
Platelets: 175 10*3/uL (ref 150–400)
RBC: 2.63 MIL/uL — ABNORMAL LOW (ref 4.22–5.81)
RDW: 12.6 % (ref 11.5–15.5)
WBC: 12.1 10*3/uL — ABNORMAL HIGH (ref 4.0–10.5)
nRBC: 0.3 % — ABNORMAL HIGH (ref 0.0–0.2)

## 2022-07-27 LAB — BASIC METABOLIC PANEL
Anion gap: 15 (ref 5–15)
BUN: 72 mg/dL — ABNORMAL HIGH (ref 8–23)
CO2: 18 mmol/L — ABNORMAL LOW (ref 22–32)
Calcium: 8.1 mg/dL — ABNORMAL LOW (ref 8.9–10.3)
Chloride: 99 mmol/L (ref 98–111)
Creatinine, Ser: 3.52 mg/dL — ABNORMAL HIGH (ref 0.61–1.24)
GFR, Estimated: 16 mL/min — ABNORMAL LOW (ref >60)
Glucose, Bld: 204 mg/dL — ABNORMAL HIGH (ref 70–99)
Potassium: 4 mmol/L (ref 3.5–5.1)
Sodium: 132 mmol/L — ABNORMAL LOW (ref 135–145)

## 2022-07-27 LAB — MAGNESIUM: Magnesium: 2.2 mg/dL (ref 1.7–2.4)

## 2022-07-27 LAB — GLUCOSE, CAPILLARY
Glucose-Capillary: 246 mg/dL — ABNORMAL HIGH (ref 70–99)
Glucose-Capillary: 190 mg/dL — ABNORMAL HIGH (ref 70–99)
Glucose-Capillary: 195 mg/dL — ABNORMAL HIGH (ref 70–99)

## 2022-07-27 LAB — LACTIC ACID, PLASMA: Lactic Acid, Venous: 2.4 mmol/L (ref 0.5–1.9)

## 2022-07-27 LAB — HEPARIN LEVEL (UNFRACTIONATED): Heparin Unfractionated: 0.34 IU/mL (ref 0.30–0.70)

## 2022-07-27 MED ORDER — HYDROMORPHONE BOLUS VIA INFUSION: 0.2000 mg | INTRAVENOUS | Status: DC | PRN

## 2022-07-27 MED ORDER — DEXTROSE 5 % IV SOLN
4.0000 mg | Freq: Once | INTRAVENOUS | Status: AC
  Administered 2022-07-27: 4 mg via INTRAVENOUS
  Filled 2022-07-27: qty 16

## 2022-07-27 MED ORDER — GLYCOPYRROLATE 0.2 MG/ML IJ SOLN: 0.2000 mg | INTRAMUSCULAR | Status: DC | PRN

## 2022-07-27 MED ORDER — GLYCOPYRROLATE 1 MG PO TABS: 1.0000 mg | ORAL_TABLET | ORAL | Status: DC | PRN

## 2022-07-27 MED ORDER — FENTANYL BOLUS VIA INFUSION: 100.0000 ug | INTRAVENOUS | Status: DC | PRN

## 2022-07-27 MED ORDER — POLYVINYL ALCOHOL 1.4 % OP SOLN: 1.0000 [drp] | Freq: Four times a day (QID) | OPHTHALMIC | Status: DC | PRN

## 2022-07-27 MED ORDER — SODIUM CHLORIDE 0.9 % IV SOLN: INTRAVENOUS | Status: DC

## 2022-07-27 MED ORDER — FENTANYL 2500MCG IN NS 250ML (10MCG/ML) PREMIX INFUSION: 0.0000 ug/h | INTRAVENOUS | Status: DC

## 2022-07-27 MED ORDER — INSULIN GLARGINE-YFGN 100 UNIT/ML ~~LOC~~ SOLN
8.0000 [IU] | Freq: Every day | SUBCUTANEOUS | Status: DC
  Administered 2022-07-27: 8 [IU] via SUBCUTANEOUS
  Filled 2022-07-27: qty 0.08

## 2022-07-27 MED ORDER — ACETAMINOPHEN 325 MG PO TABS: 650.0000 mg | ORAL_TABLET | Freq: Four times a day (QID) | ORAL | Status: DC | PRN

## 2022-07-27 MED ORDER — GLYCOPYRROLATE 0.2 MG/ML IJ SOLN
0.2000 mg | INTRAMUSCULAR | Status: DC | PRN
  Administered 2022-07-27: 0.2 mg via INTRAVENOUS
  Filled 2022-07-27: qty 1

## 2022-07-27 MED ORDER — FENTANYL CITRATE PF 50 MCG/ML IJ SOSY
25.0000 ug | PREFILLED_SYRINGE | INTRAMUSCULAR | Status: DC | PRN
  Administered 2022-07-27 (×2): 50 ug via INTRAVENOUS
  Filled 2022-07-27: qty 1

## 2022-07-27 MED ORDER — LIDOCAINE 5 % EX PTCH
1.0000 | MEDICATED_PATCH | CUTANEOUS | Status: DC
  Administered 2022-07-27: 1 via TRANSDERMAL
  Filled 2022-07-27: qty 1

## 2022-07-27 MED ORDER — FUROSEMIDE 10 MG/ML IJ SOLN
20.0000 mg/h | INTRAVENOUS | Status: DC
  Filled 2022-07-27: qty 20

## 2022-07-27 MED ORDER — FENTANYL CITRATE PF 50 MCG/ML IJ SOSY
25.0000 ug | PREFILLED_SYRINGE | INTRAMUSCULAR | Status: DC | PRN
  Filled 2022-07-27: qty 1

## 2022-07-27 MED ORDER — METOLAZONE 5 MG PO TABS
5.0000 mg | ORAL_TABLET | Freq: Once | ORAL | Status: AC
  Administered 2022-07-27: 5 mg via ORAL
  Filled 2022-07-27: qty 1

## 2022-07-27 MED ORDER — LORAZEPAM 2 MG/ML IJ SOLN
0.5000 mg | INTRAMUSCULAR | Status: DC | PRN
  Administered 2022-07-27: 0.5 mg via INTRAVENOUS
  Filled 2022-07-27: qty 1

## 2022-07-27 MED ORDER — LORAZEPAM 2 MG/ML IJ SOLN
2.0000 mg | INTRAMUSCULAR | Status: DC | PRN
  Administered 2022-07-27: 2 mg via INTRAVENOUS
  Filled 2022-07-27: qty 1

## 2022-07-27 MED ORDER — ACETAMINOPHEN 650 MG RE SUPP: 650.0000 mg | Freq: Four times a day (QID) | RECTAL | Status: DC | PRN

## 2022-07-27 MED ORDER — SODIUM BICARBONATE 650 MG PO TABS
1300.0000 mg | ORAL_TABLET | Freq: Two times a day (BID) | ORAL | Status: DC
  Administered 2022-07-27: 650 mg via ORAL
  Filled 2022-07-27: qty 2

## 2022-07-27 MED ORDER — SIMETHICONE 80 MG PO CHEW
80.0000 mg | CHEWABLE_TABLET | Freq: Three times a day (TID) | ORAL | Status: DC | PRN
  Administered 2022-07-27: 80 mg via ORAL
  Filled 2022-07-27: qty 1

## 2022-07-27 MED ORDER — SODIUM CHLORIDE 0.9 % IV SOLN: 12.5000 mg | Freq: Four times a day (QID) | INTRAVENOUS | Status: DC | PRN

## 2022-07-27 MED ORDER — DEXTROSE 5 % IV SOLN
500.0000 mg | Freq: Two times a day (BID) | INTRAVENOUS | Status: DC
  Filled 2022-07-27 (×2): qty 500

## 2022-07-27 MED ORDER — PIPERACILLIN-TAZOBACTAM IN DEX 2-0.25 GM/50ML IV SOLN
2.2500 g | Freq: Three times a day (TID) | INTRAVENOUS | Status: DC
  Administered 2022-07-27: 2.25 g via INTRAVENOUS
  Filled 2022-07-27 (×3): qty 50

## 2022-07-27 MED ORDER — HYDROMORPHONE HCL-NACL 50-0.9 MG/50ML-% IV SOLN
0.5000 mg/h | INTRAVENOUS | Status: DC
  Administered 2022-07-27: 0.5 mg/h via INTRAVENOUS
  Filled 2022-07-27 (×2): qty 50

## 2022-07-28 ENCOUNTER — Encounter (HOSPITAL_COMMUNITY): Payer: Self-pay | Admitting: Cardiology

## 2022-07-29 NOTE — Interval H&P Note (Signed)
History and Physical Interval Note:  07/29/2022 2:25 PM  Paul Kramer  has presented today for surgery, with the diagnosis of unstable angina.  The various methods of treatment have been discussed with the patient and family. After consideration of risks, benefits and other options for treatment, the patient has consented to  Procedure(s): RIGHT HEART CATH AND CORONARY ANGIOGRAPHY (N/A)  as a surgical intervention.  The patient's history has been reviewed, patient examined, no change in status, stable for surgery.  I have reviewed the patient's chart and labs.  Questions were answered to the patient's satisfaction.    Plan was to perform cardiac catheterization to visualize anatomy.  Potential right heart cath.  PCI would likely be considered in a staged manner.   Glenetta Hew

## 2022-07-31 ENCOUNTER — Telehealth (HOSPITAL_COMMUNITY): Payer: Self-pay | Admitting: *Deleted

## 2022-07-31 NOTE — Progress Notes (Addendum)
Patient given prn zofran for nausea. Pt verbalized feeling no nausea and asked for his scheduled sodium bicarb and metolazone pills. After pills administered with head of bed elevated and bed tilted upright, Pt projectile vomited approximately 300 ccs. One sodium bicarb pill visualized. Frequent coughing and partial administration of medication reported to DO Sabharwal.

## 2022-07-31 NOTE — Progress Notes (Signed)
[facility] [place] for Heparin Indication: chest pain/ACS  Allergies  Allergen Reactions   Other Shortness Of Breath    Esters   Robitussin Dm Max Day-Night Anaphylaxis and Swelling    Throat swelling   Banana Other (See Comments)    Severe indigestion   Cantaloupe (Diagnostic) Nausea Only   Contrast Media [Iodinated Contrast Media] Other (See Comments)    Just feel bad all over   Morphine And Related Other (See Comments)    Patient states he does not like to take Morphine as it gives him nightmares and hallucinations.     Patient Measurements: Height: '5\' 5"'$  (165.1 cm) Weight: 90.2 kg (198 lb 13.7 oz) IBW/kg (Calculated) : 61.5 Heparin Dosing Weight: 79 kg  Vital Signs: Temp: 98.2 F (36.8 C) 08-14-22 0730) Temp Source: Core 08/14/2022 0400) BP: 102/53 08/14/22 0730) Pulse Rate: 226 08-14-22 0715)  Labs: Recent Labs    07/09/2022 0815 07/03/2022 1359 07/16/2022 1419 07/07/2022 1612 07/26/22 0431 07/26/22 0930 07/26/22 1522 07/26/22 1747 08/14/22 0404 Aug 14, 2022 0727  HGB 10.5*   < > 9.2*  --  8.7*  --   --   --  9.3*  --   HCT 31.4*   < > 27.0*  --  26.4*  --   --   --  27.5*  --   PLT 182  --   --   --  171  --   --   --  175  --   HEPARINUNFRC  --   --   --   --   --  0.20*  --  0.22*  --  0.34  CREATININE  --    < >  --    < > 3.10*  --  3.23*  --  3.52*  --    < > = values in this interval not displayed.     Estimated Creatinine Clearance: 14.4 mL/min (A) (by C-G formula based on SCr of 3.52 mg/dL (H)).   Medical History: Past Medical History:  Diagnosis Date   First detected episode of atrial fibrillation ([place]) 11/29/2017   In setting of STEMI -- NO further documented episodes   Hypercholesteremia    Hypertension    Multiple vessel coronary artery disease    Cath 11/29/17 - 3 V CAD (CTO RCA), 95% m-d Cx (@ OM2 55%)-> PCI Cx-PTCA Om2; 85% p-m LAD => DES PCI ; 01/2022 -> LM<LAD-LCx 80%-80% - 95%, with patent LAD and LCx stents. => Staged  atherectomy-shockwave bifurcation LM-LAD-LCx PCI.   Myocardial infarction of inferolateral wall ([place]) 11/2017   ? prior Infarct; - Cath 11/29/17 - 3 V CAD (CTO RCA), 95% m-d Cx (@ OM2 55%)-> PCI Cx-PTCA Om2; 85% p-m LAD => DES PCI    Narcolepsy    pt may pass out when laughing   Spinal stenosis    TIA (transient ischemic attack)    Assessment: 61 YOM admitted for cardiogenic shock s/p IABP placement. Pharmacy consulted for heparin.  Given his significant coronary disease, heparin goal is 0.3-0.5. Patient in afib for several hours overnight August 14, 2022. Minor oozing at IABP site per RN, but no overt bleeding. CBC low but stable.  Goal of Therapy:  Heparin level 0.3-0.5 units/ml Monitor platelets by anticoagulation protocol: Yes   Plan:  -Continue heparin at 1050 units/hr -Confirmatory heparin level in 8 hours given mechanical device -Heparin level and CBC daily  Thank you for involving pharmacy in this patient's care.  Reatha Harps, PharmD PGY2 Pharmacy Resident  2022-08-10 8:28 AM

## 2022-07-31 NOTE — Consult Note (Signed)
Advanced Heart Failure Team Consult Note   Primary Physician: Lajean Manes, MD PCP-Cardiologist:  Glenetta Hew, MD  Reason for Consultation: Acute on Chronic Systolic Heart Failure>>Shock Due to ICM   Subjective / Interval hx:   Mr. Boule is a pleasant 89 YO WM w/ ischemic cardiomyopathy, HFrEF (LVEF 45%), CKD (baseline sCr 1.7-2), pAF and mutlivessel CAD w/ known CTO of the distal RCA filled by left to right collaterals admitted directly from the cath lab for cardiogenic shock and severe LM/Lcx stenosis. Mr. Amon has been followed by Dr. Ellyn Hack for several years now. In August 2023, he underwent complex / high risk PCI to the LM-LAD-Lcx with excellent results. After PCI he was very functional; able to walk and bike several miles without much difficulty. Today due to recurrent angina, he presented for LHC. Cath was significant for severe 99% LM->Lcx instent stenosis. HF was called to bedside for admission.    RHC hemodynamics: RA 17, RV 61/32, PA 63/35(48), PCWP 33 w/ 41mHg V waves & Fick CI of 1.6, PAPi 1.5.  After discussion with Dr. HEllyn Hack the decision was made to place an IABP for support; impella placement would be difficult due to peripheral vascular disease and risk of limb malperfusion. After IABP placement, Mr. Barreira had improvement in PA pressures to 40s-50s/30s. The decision was made to defer PCI until medical optimization.   This AM  - CVP 13 - PCWP 25 with V waves to 35-474mg - PA 50s-60s/30  Objective:    Vital Signs:   Temp:  [97.2 F (36.2 C)-99 F (37.2 C)] 98.2 F (36.8 C) (002-20-24730) Pulse Rate:  [107-257] 226 (0Feb 20, 2024715) Resp:  [13-28] 26 (020-Feb-2024730) BP: (80-136)/(40-93) 102/53 (0February 20, 2024730) SpO2:  [87 %-96 %] 94 % (02024-02-20715) Weight:  [90.2 kg] 90.2 kg (002-20-24500) Last BM Date :  (PTA)  Weight change: Filed Weights   07/14/2022 0735 07/26/22 0500 [id number]  Weight: 83.5 kg 88.9 kg 90.2 kg    Intake/Output:   Intake/Output Summary  (Last 24 hours) at 07/01/18/2024907 Last data filed at 06/2022/02/20800 Gross per 24 hour  Intake 1432.93 ml  Output 1317 ml  Net 115.93 ml       Physical Exam    General:  elderly WM; comfortable in bed; NAD.  HEENT: normal Neck: supple. JVD elevated to jaw. Carotids 2+ bilat; no bruits. No lymphadenopathy or thyromegaly appreciated. Cor: PMI nondisplaced. Regular rate & rhythm. No rubs, gallops or murmurs. Lungs: CTA B/L Abdomen: soft, nontender, nondistended. No hepatosplenomegaly. No bruits or masses. Good bowel sounds. Extremities: warm to touch; right femoral swan and IABP in place; pulses intact.  Neuro: alert & oriented x 3, cranial nerves grossly intact. moves all 4 extremities w/o difficulty. Affect pleasant.   Telemetry   NSR 60s-70s.   EKG    07/22/22 NSR 1st degree HB, 73 bpm  Labs   Basic Metabolic Panel: Recent Labs  Lab 07/22/22 1441 07/18/2022 1359 07/21/2022 1413 07/07/2022 1419 07/21/2022 1612 07/26/22 0431 07/26/22 1522 01Feb 20, 2024404  NA 140 133*   < > 137 133* 132* 132* 132*  K 5.5* 5.1   < > 4.8 4.9 4.6 4.3 4.0  CL 104 106  --   --  104 103 100 99  CO2 20  --   --   --  20* 18* 20* 18*  GLUCOSE 125* 194*  --   --  190* 202* 244* 204*  BUN 41* 48*  --   --  55* 66* 70* 72*  CREATININE 2.29* 3.30*  --   --  2.97* 3.10* 3.23* 3.52*  CALCIUM 9.0  --   --   --  8.2* 7.9* 8.0* 8.1*  MG  --   --   --   --   --  2.3  --  2.2   < > = values in this interval not displayed.     Liver Function Tests: No results for input(s): "AST", "ALT", "ALKPHOS", "BILITOT", "PROT", "ALBUMIN" in the last 168 hours. No results for input(s): "LIPASE", "AMYLASE" in the last 168 hours. No results for input(s): "AMMONIA" in the last 168 hours.  CBC: Recent Labs  Lab 07/19/2022 0815 06/30/2022 1359 07/08/2022 1413 07/29/2022 1418 07/24/2022 1419 07/26/22 0431 08/24/2022 0404  WBC 6.8  --   --   --   --  11.0* 12.1*  HGB 10.5*   < > 9.2* 9.5* 9.2* 8.7* 9.3*  HCT 31.4*   < > 27.0*  28.0* 27.0* 26.4* 27.5*  MCV 105.0*  --   --   --   --  105.6* 104.6*  PLT 182  --   --   --   --  171 175   < > = values in this interval not displayed.     Cardiac Enzymes: No results for input(s): "CKTOTAL", "CKMB", "CKMBINDEX", "TROPONINI" in the last 168 hours.  BNP: BNP (last 3 results) Recent Labs    02/15/22 1407 02/18/22 0036  BNP 1,060.9* 623.5*     ProBNP (last 3 results) No results for input(s): "PROBNP" in the last 8760 hours.   CBG: Recent Labs  Lab 07/26/22 1150 07/26/22 1642 07/26/22 1937 07/26/22 2352 2022/08/24 0357  GLUCAP 249* 232* 217* 209* 246*     Coagulation Studies: No results for input(s): "LABPROT", "INR" in the last 72 hours.   Imaging   ECHOCARDIOGRAM COMPLETE  Result Date: 07/26/2022    ECHOCARDIOGRAM REPORT   Patient Name:   DEVONTAE CASASOLA Date of Exam: 07/26/2022 Medical Rec #:  [id number]     Height:       65.0 in Accession #:    [id number]    Weight:       196.0 lb Date of Birth:  [birth year]    BSA:          1.961 m Patient Age:    11 years      BP:           107/53 mmHg Patient Gender: M             HR:           65 bpm. Exam Location:  Inpatient Procedure: 2D Echo Indications:    congestive heart faillure  History:        Patient has prior history of Echocardiogram examinations, most                 recent 02/16/2022. Cardiomyopathy and CHF, CAD, Balloon pump,                 Arrythmias:Paroxysmal A-Fib; Risk Factors:Hypertension and                 Dyslipidemia.  Sonographer:    Johny Chess RDCS Referring Phys: [place]  1. Hypokinesis of the inferolateral wall with overall moderate LV dysfunction.  2. Left ventricular ejection fraction, by estimation, is 35 to 40%. The left ventricle has moderately decreased function. The left ventricle demonstrates regional wall motion abnormalities (see scoring  diagram/findings for description). The left ventricular internal cavity size was mildly dilated. Left  ventricular diastolic function could not be evaluated.  3. Right ventricular systolic function is moderately reduced. The right ventricular size is normal. There is mildly elevated pulmonary artery systolic pressure.  4. Left atrial size was severely dilated.  5. The mitral valve is normal in structure. Mild mitral valve regurgitation. No evidence of mitral stenosis.  6. The aortic valve is tricuspid. Aortic valve regurgitation is mild. Mild to moderate aortic valve stenosis.  7. The inferior vena cava is normal in size with greater than 50% respiratory variability, suggesting right atrial pressure of 3 mmHg. FINDINGS  Left Ventricle: Left ventricular ejection fraction, by estimation, is 35 to 40%. The left ventricle has moderately decreased function. The left ventricle demonstrates regional wall motion abnormalities. The left ventricular internal cavity size was mildly dilated. There is no left ventricular hypertrophy. Left ventricular diastolic function could not be evaluated. Right Ventricle: The right ventricular size is normal. No increase in right ventricular wall thickness. Right ventricular systolic function is moderately reduced. There is mildly elevated pulmonary artery systolic pressure. The tricuspid regurgitant velocity is 3.14 m/s, and with an assumed right atrial pressure of 3 mmHg, the estimated right ventricular systolic pressure is 71.2 mmHg. Left Atrium: Left atrial size was severely dilated. Right Atrium: Right atrial size was normal in size. Pericardium: There is no evidence of pericardial effusion. Mitral Valve: The mitral valve is normal in structure. Mild mitral valve regurgitation. No evidence of mitral valve stenosis. Tricuspid Valve: The tricuspid valve is normal in structure. Tricuspid valve regurgitation is mild . No evidence of tricuspid stenosis. Aortic Valve: The aortic valve is tricuspid. Aortic valve regurgitation is mild. Aortic regurgitation PHT measures 267 msec. Mild to moderate  aortic stenosis is present. Aortic valve mean gradient measures 9.0 mmHg. Aortic valve peak gradient measures 19.2 mmHg. Aortic valve area, by VTI measures 1.38 cm. Pulmonic Valve: The pulmonic valve was not well visualized. Pulmonic valve regurgitation is trivial. No evidence of pulmonic stenosis. Aorta: The aortic root is normal in size and structure. Venous: The inferior vena cava is normal in size with greater than 50% respiratory variability, suggesting right atrial pressure of 3 mmHg. IAS/Shunts: No atrial level shunt detected by color flow Doppler. Additional Comments: Hypokinesis of the inferolateral wall with overall moderate LV dysfunction. A venous catheter is visualized.  LEFT VENTRICLE PLAX 2D LVIDd:         5.40 cm LVIDs:         4.80 cm LV PW:         1.00 cm LV IVS:        0.90 cm LVOT diam:     1.90 cm LV SV:         53 LV SV Index:   27 LVOT Area:     2.84 cm  RIGHT VENTRICLE             IVC RV Basal diam:  3.10 cm     IVC diam: 2.00 cm RV S prime:     12.70 cm/s TAPSE (M-mode): 1.8 cm LEFT ATRIUM             Index        RIGHT ATRIUM           Index LA diam:        4.00 cm 2.04 cm/m   RA Area:     14.50 cm LA Vol (A2C):   83.1 ml  42.37 ml/m  RA Volume:   37.20 ml  18.97 ml/m LA Vol (A4C):   96.6 ml 49.26 ml/m LA Biplane Vol: 93.0 ml 47.42 ml/m  AORTIC VALVE AV Area (Vmax):    1.28 cm AV Area (Vmean):   1.28 cm AV Area (VTI):     1.38 cm AV Vmax:           219.00 cm/s AV Vmean:          139.000 cm/s AV VTI:            0.380 m AV Peak Grad:      19.2 mmHg AV Mean Grad:      9.0 mmHg LVOT Vmax:         99.20 cm/s LVOT Vmean:        62.950 cm/s LVOT VTI:          0.186 m LVOT/AV VTI ratio: 0.49 AI PHT:            267 msec  AORTA Ao Root diam: 3.60 cm Ao Asc diam:  3.70 cm MITRAL VALVE                  TRICUSPID VALVE MV Area (PHT): 2.95 cm       TR Peak grad:   39.4 mmHg MV Decel Time: 257 msec       TR Vmax:        314.00 cm/s MR Peak grad:    68.6 mmHg MR Mean grad:    42.0 mmHg     SHUNTS MR Vmax:         414.00 cm/s  Systemic VTI:  0.19 m MR Vmean:        302.0 cm/s   Systemic Diam: 1.90 cm MR PISA:         1.01 cm MR PISA Eff ROA: 9 mm MR PISA Radius:  0.40 cm MV E velocity: 129.00 cm/s MV A velocity: 74.40 cm/s MV E/A ratio:  1.73 Kirk Ruths MD Electronically signed by Kirk Ruths MD Signature Date/Time: 07/26/2022/4:16:21 PM    Final    DG CHEST PORT 1 VIEW  Result Date: 07/26/2022 CLINICAL DATA:  Central line placement. EXAM: PORTABLE CHEST 1 VIEW COMPARISON:  Earlier today. FINDINGS: Interval right PICC with its tip in the inferior aspect of the superior vena cava approximately 3 cm above the superior cavoatrial junction. Stable lower extremity central venous line with its tip projected over the left lower lobe pulmonary artery. Stable intra-aortic balloon pump marker at the upper T6 level. The prominence of the interstitial markings has significantly improved and almost completely resolved. No airspace opacities remain. Probable minimal right pleural at minimal bilateral pleural fluid. Stable enlarged cardiac silhouette. No acute bony abnormality. IMPRESSION: 1. Interval right PICC with its tip in the inferior aspect of the superior vena cava approximately 3 cm above the superior cavoatrial junction. 2. Stable cardiomegaly. 3. Significantly improved interstitial pulmonary edema. Electronically Signed   By: Claudie Revering M.D.   On: 07/26/2022 11:53     Medications:     Current Medications:  aspirin EC  81 mg Oral Daily   atorvastatin  40 mg Oral QHS   Chlorhexidine Gluconate Cloth  6 each Topical Daily   cholecalciferol  2,000 Units Oral Q1200   clopidogrel  75 mg Oral Daily   cyanocobalamin  5,000 mcg Oral Q1200   insulin aspart  0-15 Units Subcutaneous Q4H   insulin glargine-yfgn  8 Units Subcutaneous Daily   metolazone  5 mg Oral Once   senna-docusate  1 tablet Oral QHS   sodium bicarbonate  1,300 mg Oral BID   sodium chloride flush  10-40 mL Intracatheter  Q12H   sodium chloride flush  3 mL Intravenous Q12H    Infusions:  sodium chloride     amiodarone 60 mg/hr (15-Aug-2022 0840)   bumetanide (BUMEX) IV     cefTRIAXone (ROCEPHIN)  IV 2 g (15-Aug-2022 0820)   epinephrine 2 mcg/min (08/15/22 0800)   heparin 1,050 Units/hr (08/15/2022 0800)   milrinone 0.25 mcg/kg/min (08-15-22 0800)   norepinephrine (LEVOPHED) Adult infusion 2 mcg/min (15-Aug-2022 0800)   vasopressin 0.03 Units/min (08-15-22 0800)      Patient Profile   Mr. Bunyard is a 89 y.o. male with chronic HFrEF due to ICM, CAD s/p fairly recent complex high risk PCI to LM-LAD-LCx 01/2022, known CTO of m-dRCA, PAF on Eliquis, HTN, HLD, TIA and CKD IV.  Presented for elective R/LHC in setting of recurrent unstable angina and exertional dyspnea. R/LHC showed severe in-stent restenosis of the bifurcation LM-LCx-LAD stents, elevated R+ L filling pressures and low output w/ CI 1.6. IABP placed. Admitted to ICU. AHF team asked to further assist.    Assessment/Plan   1. Acute on Chronic Systolic Heart Failure>>Cardiogenic Shock  - ischemic CM, MVCAD w/ LM-LAD-LCx bifurcation PCI < 6 months ago - Echo 8/23 EF 45-50%, RV ok  - EF now severely reduced by LVG in setting of severe in-stent restenosis LM-LAD-LCx bifrucation - Lasix '80mg'$  IV BID, Metolazone '5mg'$  w/ 1.2 liters urine output over night, positive 240cc.  - This AM PCWP 25-28 with large V waves; CVP 12-13. Bedside ultrasound w/ IVC 2.2 cm, LV function 35%, RV function only mildly reduced. Moderate TR. Severe mitral regurgitation consistent with large V waves.  - Cardiac index by bedside TD of 2.4 L/min/m2. Mixed venous 58%, eFick - 2.67 L/min/m2.  - Cardiac outputs are now adequate, need to maintain net negative volume status. Bumex '4mg'$  IV + metolazone '5mg'$  today. I believe he is only a few liters volume up; high filling pressures due to severe MR.  2. Multivessel CAD  - known CTO of m-dRCA w/ L>>R collaterals, s/p prior p-mLAD and m-dLCX PCI -  recent  LM-LAD-LCx bifurcation PCI 8/23 - now w/ ISR of LM-LAD-LCx bifurcation - not candidate for CABG given age and CKD IV, deferring high risk PCI until optimized from HF/ renal standpoint - ASA, Plavix and statin - no ? blocker w/ shock  - Cont IABP to help w/ coronary perfusion  - hep gtt  3. AKI on CKD IV  - b/l SCr 1.7-2.2.  - SCr 3.30 on admit, minimal contrast used for cath <15 mL - likely cardiorenal from low-output HF - support CO w/ IABP and milrinone  - aim to keep SBP > 110, may need addition of NE to maintain  - sCr 3.5 today, BUN 72; nephrology following. High risk for CVVH. Possibly 2/2 ATN, will continue to monitor.  4. PAF - in NSR - afib overnight, back in NSR w/ amiodarone bolus overnight.  - will prophylacticaly start IV amio while on milrinone  - hold Eliquis. Cover w/ heparin gtt    Length of Stay: 2  Dianca Owensby, DO  15-Aug-2022, 9:07 AM  Advanced Heart Failure Team Pager [phone] (M-F; 7a - 5p)  Please contact [facility] for night-coverage after hours (4p -7a ) and weekends on [email or web address]  CRITICAL CARE Performed by: Hebert Soho   Total critical care  time: 40 minutes  Critical care time was exclusive of separately billable procedures and treating other patients.  Critical care was necessary to treat or prevent imminent or life-threatening deterioration.  Critical care was time spent personally by me on the following activities: development of treatment plan with patient and/or surrogate as well as nursing, discussions with consultants, evaluation of patient's response to treatment, examination of patient, obtaining history from patient or surrogate, ordering and performing treatments and interventions, ordering and review of laboratory studies, ordering and review of radiographic studies, pulse oximetry and re-evaluation of patient's condition.

## 2022-07-31 NOTE — Progress Notes (Signed)
[facility] Progress Note    Assessment/ Plan:   AKI on CKD 3b -followed by Dr. Posey Pronto. Last known Cr 06/16/22 was 1.8, GFR 35 -AKI likely CRS/cardiogenic shock, potential for CIN however minimal contrast utilized (~12cc). Renal u/s with nonobstructing stones otherwise unremarkable -hopefully will have some recovery with inotropic support (milrinone) and diuresis. Cr up to 3.5 today -No indications for renal replacement therapy at this junction. Age and other comorbidities may be a limiting factor for long term dialysis if it were to come to that. Discussed with son in law as well at the bedside. -Avoid nephrotoxic medications including NSAIDs and iodinated intravenous contrast exposure unless the latter is absolutely indicated.  Preferred narcotic agents for pain control are hydromorphone, fentanyl, and methadone. Morphine should not be used. Avoid Baclofen and avoid oral sodium phosphate and magnesium citrate based laxatives / bowel preps. Continue strict Input and Output monitoring. Will monitor the patient closely with you and intervene or adjust therapy as indicated by changes in clinical status/labs    Acute on chronic systolic CHF exacerbation, cardiogenic shock -supported with IABP+Milrinone per cardio -lasix '80mg'$  iv BID -on vaso, levo, epi currently -improved pulmonary edema on cxr 1/27   Multivessel CAD -s/p LHC 1/26 -per cardiology   pAfib -on IV amio   Anemia -transfuse prn for hgb <7 -trending downwards, 9.3 today. Per primary service  Metabolic acidosis -will start nacho3 PO  Hyponatremia -secondary to CHF and AKI -Na stable 132. Receiving diuresis as above  Subjective:   Patient seen and examined bedside in ICU. UOP 1.2L. Had Afib w/ RVR and chest pain overnight. He does report having gas in abd. Received total '240mg'$  of lasix + metolazone '5mg'$  x 1 dose yesterday On vaso, levo, epi. Son in Sports coach at bedside.   Objective:   BP (!) 102/53   Pulse  (!) 226   Temp 98.2 F (36.8 C)   Resp (!) 26   Ht '5\' 5"'$  (1.651 m)   Wt 90.2 kg   SpO2 94%   BMI 33.09 kg/m   Intake/Output Summary (Last 24 hours) at 2022/08/01 8366 Last data filed at 01-Aug-2022 0700 Gross per 24 hour  Intake 1404.67 ml  Output 1162 ml  Net 242.67 ml   Weight change: 6.738 kg  Physical Exam: Gen: elderly male, NAD, laying flat in bed CVS: RRR Resp: CTA b/l Abd: soft, nt/nd Ext:  no sig edema b/l Les, left fem IABP Neuro: awake, alert, hard of hearing  Imaging: ECHOCARDIOGRAM COMPLETE  Result Date: 07/26/2022    ECHOCARDIOGRAM REPORT   Patient Name:   Paul Kramer Date of Exam: 07/26/2022 Medical Rec #:  [id number]     Height:       65.0 in Accession #:    [id number]    Weight:       196.0 lb Date of Birth:  [birth year]    BSA:          1.961 m Patient Age:    89 years      BP:           107/53 mmHg Patient Gender: M             HR:           65 bpm. Exam Location:  Inpatient Procedure: 2D Echo Indications:    congestive heart faillure  History:        Patient has prior history of Echocardiogram examinations, most  recent 02/16/2022. Cardiomyopathy and CHF, CAD, Balloon pump,                 Arrythmias:Paroxysmal A-Fib; Risk Factors:Hypertension and                 Dyslipidemia.  Sonographer:    Johny Chess RDCS Referring Phys: [place]  1. Hypokinesis of the inferolateral wall with overall moderate LV dysfunction.  2. Left ventricular ejection fraction, by estimation, is 35 to 40%. The left ventricle has moderately decreased function. The left ventricle demonstrates regional wall motion abnormalities (see scoring diagram/findings for description). The left ventricular internal cavity size was mildly dilated. Left ventricular diastolic function could not be evaluated.  3. Right ventricular systolic function is moderately reduced. The right ventricular size is normal. There is mildly elevated pulmonary artery systolic  pressure.  4. Left atrial size was severely dilated.  5. The mitral valve is normal in structure. Mild mitral valve regurgitation. No evidence of mitral stenosis.  6. The aortic valve is tricuspid. Aortic valve regurgitation is mild. Mild to moderate aortic valve stenosis.  7. The inferior vena cava is normal in size with greater than 50% respiratory variability, suggesting right atrial pressure of 3 mmHg. FINDINGS  Left Ventricle: Left ventricular ejection fraction, by estimation, is 35 to 40%. The left ventricle has moderately decreased function. The left ventricle demonstrates regional wall motion abnormalities. The left ventricular internal cavity size was mildly dilated. There is no left ventricular hypertrophy. Left ventricular diastolic function could not be evaluated. Right Ventricle: The right ventricular size is normal. No increase in right ventricular wall thickness. Right ventricular systolic function is moderately reduced. There is mildly elevated pulmonary artery systolic pressure. The tricuspid regurgitant velocity is 3.14 m/s, and with an assumed right atrial pressure of 3 mmHg, the estimated right ventricular systolic pressure is 59.1 mmHg. Left Atrium: Left atrial size was severely dilated. Right Atrium: Right atrial size was normal in size. Pericardium: There is no evidence of pericardial effusion. Mitral Valve: The mitral valve is normal in structure. Mild mitral valve regurgitation. No evidence of mitral valve stenosis. Tricuspid Valve: The tricuspid valve is normal in structure. Tricuspid valve regurgitation is mild . No evidence of tricuspid stenosis. Aortic Valve: The aortic valve is tricuspid. Aortic valve regurgitation is mild. Aortic regurgitation PHT measures 267 msec. Mild to moderate aortic stenosis is present. Aortic valve mean gradient measures 9.0 mmHg. Aortic valve peak gradient measures 19.2 mmHg. Aortic valve area, by VTI measures 1.38 cm. Pulmonic Valve: The pulmonic valve was  not well visualized. Pulmonic valve regurgitation is trivial. No evidence of pulmonic stenosis. Aorta: The aortic root is normal in size and structure. Venous: The inferior vena cava is normal in size with greater than 50% respiratory variability, suggesting right atrial pressure of 3 mmHg. IAS/Shunts: No atrial level shunt detected by color flow Doppler. Additional Comments: Hypokinesis of the inferolateral wall with overall moderate LV dysfunction. A venous catheter is visualized.  LEFT VENTRICLE PLAX 2D LVIDd:         5.40 cm LVIDs:         4.80 cm LV PW:         1.00 cm LV IVS:        0.90 cm LVOT diam:     1.90 cm LV SV:         53 LV SV Index:   27 LVOT Area:     2.84 cm  RIGHT VENTRICLE  IVC RV Basal diam:  3.10 cm     IVC diam: 2.00 cm RV S prime:     12.70 cm/s TAPSE (M-mode): 1.8 cm LEFT ATRIUM             Index        RIGHT ATRIUM           Index LA diam:        4.00 cm 2.04 cm/m   RA Area:     14.50 cm LA Vol (A2C):   83.1 ml 42.37 ml/m  RA Volume:   37.20 ml  18.97 ml/m LA Vol (A4C):   96.6 ml 49.26 ml/m LA Biplane Vol: 93.0 ml 47.42 ml/m  AORTIC VALVE AV Area (Vmax):    1.28 cm AV Area (Vmean):   1.28 cm AV Area (VTI):     1.38 cm AV Vmax:           219.00 cm/s AV Vmean:          139.000 cm/s AV VTI:            0.380 m AV Peak Grad:      19.2 mmHg AV Mean Grad:      9.0 mmHg LVOT Vmax:         99.20 cm/s LVOT Vmean:        62.950 cm/s LVOT VTI:          0.186 m LVOT/AV VTI ratio: 0.49 AI PHT:            267 msec  AORTA Ao Root diam: 3.60 cm Ao Asc diam:  3.70 cm MITRAL VALVE                  TRICUSPID VALVE MV Area (PHT): 2.95 cm       TR Peak grad:   39.4 mmHg MV Decel Time: 257 msec       TR Vmax:        314.00 cm/s MR Peak grad:    68.6 mmHg MR Mean grad:    42.0 mmHg    SHUNTS MR Vmax:         414.00 cm/s  Systemic VTI:  0.19 m MR Vmean:        302.0 cm/s   Systemic Diam: 1.90 cm MR PISA:         1.01 cm MR PISA Eff ROA: 9 mm MR PISA Radius:  0.40 cm MV E velocity: 129.00  cm/s MV A velocity: 74.40 cm/s MV E/A ratio:  1.73 Kirk Ruths MD Electronically signed by Kirk Ruths MD Signature Date/Time: 07/26/2022/4:16:21 PM    Final    DG CHEST PORT 1 VIEW  Result Date: 07/26/2022 CLINICAL DATA:  Central line placement. EXAM: PORTABLE CHEST 1 VIEW COMPARISON:  Earlier today. FINDINGS: Interval right PICC with its tip in the inferior aspect of the superior vena cava approximately 3 cm above the superior cavoatrial junction. Stable lower extremity central venous line with its tip projected over the left lower lobe pulmonary artery. Stable intra-aortic balloon pump marker at the upper T6 level. The prominence of the interstitial markings has significantly improved and almost completely resolved. No airspace opacities remain. Probable minimal right pleural at minimal bilateral pleural fluid. Stable enlarged cardiac silhouette. No acute bony abnormality. IMPRESSION: 1. Interval right PICC with its tip in the inferior aspect of the superior vena cava approximately 3 cm above the superior cavoatrial junction. 2. Stable cardiomegaly. 3. Significantly improved interstitial pulmonary edema. Electronically Signed  By: Claudie Revering M.D.   On: 07/26/2022 11:53   DG Chest Port 1 View  Result Date: 07/26/2022 CLINICAL DATA:  Intra-aortic balloon pump assist. EXAM: PORTABLE CHEST 1 VIEW COMPARISON:  07/05/2022 FINDINGS: The right lower extremity central line tip remains projected over the proximal left lower lobe pulmonary artery. The intra-aortic balloon pump marker remains at the upper T6 level just inferior to the aortic arch. Stable enlarged cardiac silhouette. Diffusely prominent interstitial markings with improvement and improved associated patchy densities. No visible pleural fluid. No acute bony abnormality. IMPRESSION: 1. The right lower extremity central line tip remains projected over the proximal left lower lobe pulmonary artery. 2. Stable cardiomegaly. 3. Improving pulmonary  edema. Electronically Signed   By: Claudie Revering M.D.   On: 07/26/2022 11:51   US RENAL  Result Date: 07/22/2022 CLINICAL DATA:  Acute renal injury EXAM: RENAL / URINARY TRACT ULTRASOUND COMPLETE COMPARISON:  12/03/2017 FINDINGS: Right Kidney: Renal measurements: 10.6 x 5.1 x 4.9 cm. = volume: 137 mL. No mass lesion or hydronephrosis is noted. Echogenicity is within normal limits. Nonobstructing calculus is noted in the lower pole measuring up to 6 mm. Left Kidney: Renal measurements: 11.0 x 6.4 x 5.0 cm. = volume: 182 mL. 3.9 cm cyst Is again noted in the midportion of the left kidney similar to that seen on prior exam. 1.6 cm nonobstructing stone is noted within the left kidney. Bladder: Decompressed by Foley catheter. Other: Large bilateral pleural effusions are seen. Splenic granulomata are noted. IMPRESSION: Large bilateral pleural effusions. Left renal cyst similar to that seen on prior exam. Bilateral nonobstructing renal calculi. Electronically Signed   By: Inez Catalina M.D.   On: 07/10/2022 22:19   [place] EKG SITE RITE  Result Date: 07/14/2022 If Site Rite image not attached, placement could not be confirmed due to current cardiac rhythm.  CARDIAC CATHETERIZATION  Result Date: 07/18/2022   Mid LM to Dist LM lesion is 95% stenosed with 95% stenosed side branch in Ost Cx.   Dist LM to Ost LAD lesion is 95% stenosed.   Prox LAD-1 stent is widely patent  Prox LAD-2 lesion is 20% stenosed. Mid LAD-1 lesion is 30% stenosed. Mid LAD-2 lesion is 50% stenosed.   Mid Cx to Dist Cx stent is widely patent with 20% stenosed side branch in Ost 2nd Mrg   RCA not injected, known to be occluded.   Hemodynamic findings consistent with Severe Pulmonary Hypertension (mean PAP 46 mmHg).  LV End Diastolic Pressure is severely elevated -> 36 to 38 mmHg.   Severely reduced Cardiac Output/Index and PAPI.   There is no aortic valve stenosis. POSTPROCEDURE DIAGNOSIS Severe in-stent restenosis of the bifurcation LM-LCx-LAD  stents at the bifurcation only. (99%); known occlusion of the RCA Coronary angiography performed with <15 mL IV contrast Severe ischemic cardiomyopathy with cardiac index of 1161 according to output of 3.07 by Fick which correlates with pression of renal failure (creatinine on that was up to 3 3) Severe Secondary Pulmonary (Venous) Hypertension with mean PAP of 46 mmHg and PCWP of 36 mm, and LVEDP of 24 mmHg. Successful IABP placement with notable improvement in right heart numbers. RECOMMENDATIONS The patient will be admitted Into the CVICU under the Care Of of the Advanced Heart Failure Service (Dr. Daniel Nones) with IABP and Swan-Ganz catheter in place. Plan will be inotropic support along with balloon pump to allow for diuresis" tune up prior to potentially planned high-pressure POBA of LM-LCx-LAD We have consulted Nephrology Glenetta Hew,  MD  Port CXR  Result Date: 06/30/2022 CLINICAL DATA:  [id number] Encounter for central line placement [id number] EXAM: PORTABLE CHEST 1 VIEW COMPARISON:  February 15, 2022 FINDINGS: The cardiomediastinal silhouette is unchanged in contour.Lower extremity approach central line tip terminates over the expected LEFT lower lobe segmental to subsegmental pulmonary artery. IABP metallic marker projects at the approximate T6 level just inferior to the aortic arch. Favored small RIGHT pleural effusion. No pneumothorax. Patchy bibasilar opacities with diffuse interstitial prominence and bronchial wall thickening. IMPRESSION: 1. Lower extremity approach central line tip terminates over the expected LEFT lower lobe segmental to subsegmental pulmonary artery. 2.  Favored pulmonary edema with scattered bibasilar atelectasis Electronically Signed   By: Valentino Saxon M.D.   On: 07/01/2022 16:19    Labs: BMET Recent Labs  Lab 07/22/22 1441 07/22/22 1441 07/17/2022 1359 07/30/2022 1413 07/26/2022 1418 07/23/2022 1419 06/30/2022 1612 07/26/22 0431 07/26/22 1522 2022-08-14 0404  NA 140   < >  133* 134* 134* 137 133* 132* 132* 132*  K 5.5*  --  5.1 4.9 5.1 4.8 4.9 4.6 4.3 4.0  CL 104  --  106  --   --   --  104 103 100 99  CO2 20  --   --   --   --   --  20* 18* 20* 18*  GLUCOSE 125*  --  194*  --   --   --  190* 202* 244* 204*  BUN 41*  --  48*  --   --   --  55* 66* 70* 72*  CREATININE 2.29*  --  3.30*  --   --   --  2.97* 3.10* 3.23* 3.52*  CALCIUM 9.0  --   --   --   --   --  8.2* 7.9* 8.0* 8.1*   < > = values in this interval not displayed.   CBC Recent Labs  Lab 07/18/2022 0815 07/22/2022 1359 07/01/2022 1418 07/22/2022 1419 07/26/22 0431 08/14/22 0404  WBC 6.8  --   --   --  11.0* 12.1*  HGB 10.5*   < > 9.5* 9.2* 8.7* 9.3*  HCT 31.4*   < > 28.0* 27.0* 26.4* 27.5*  MCV 105.0*  --   --   --  105.6* 104.6*  PLT 182  --   --   --  171 175   < > = values in this interval not displayed.    Medications:     aspirin EC  81 mg Oral Daily   atorvastatin  40 mg Oral QHS   Chlorhexidine Gluconate Cloth  6 each Topical Daily   cholecalciferol  2,000 Units Oral Q1200   clopidogrel  75 mg Oral Daily   cyanocobalamin  5,000 mcg Oral Q1200   furosemide  80 mg Intravenous BID   insulin aspart  0-15 Units Subcutaneous Q4H   senna-docusate  1 tablet Oral QHS   sodium chloride flush  10-40 mL Intracatheter Q12H   sodium chloride flush  3 mL Intravenous Q12H      Gean Quint, MD [facility] 08-14-2022, 8:14 AM

## 2022-07-31 NOTE — Consult Note (Signed)
NAME:  Paul Kramer, MRN:  [id number], DOB:  1929-07-15, LOS: 2 ADMISSION DATE:  07/21/2022, CONSULTATION DATE:  Aug 18, 2022 REFERRING MD:  Daniel Nones , CHIEF COMPLAINT:  respiratory failure.   History of Present Illness:  89 year old man with cardiogenic shock following NSTEMI on background of ischemic cardiomyopathy. On cath he had 99% left main main with left circumflex in-stent restenosis. The plan was to medically optimize prior to attempted repeat PCI.  Unfortunately patient has experienced worsening cardiogenic shock.  This morning he is in increasing respiratory distress and has had an episode of emesis.  Pertinent  Medical History   Past Medical History:  Diagnosis Date   First detected episode of atrial fibrillation ([place]) 11/29/2017   In setting of STEMI -- NO further documented episodes   Hypercholesteremia    Hypertension    Multiple vessel coronary artery disease    Cath 11/29/17 - 3 V CAD (CTO RCA), 95% m-d Cx (@ OM2 55%)-> PCI Cx-PTCA Om2; 85% p-m LAD => DES PCI ; 01/2022 -> LM<LAD-LCx 80%-80% - 95%, with patent LAD and LCx stents. => Staged atherectomy-shockwave bifurcation LM-LAD-LCx PCI.   Myocardial infarction of inferolateral wall ([place]) 11/2017   ? prior Infarct; - Cath 11/29/17 - 3 V CAD (CTO RCA), 95% m-d Cx (@ OM2 55%)-> PCI Cx-PTCA Om2; 85% p-m LAD => DES PCI    Narcolepsy    pt may pass out when laughing   Spinal stenosis    TIA (transient ischemic attack)      Significant Hospital Events: Including procedures, antibiotic start and stop dates in addition to other pertinent events   1/26 admission balloon pump insertion. Aug 18, 2022 increasing vasopressor and inotropic requirements.  Interim History / Subjective:  Continues to have nausea in spite of Zofran.  Increasing shortness of breath.  Patient has prior established DNR.  Objective   Blood pressure (!) 95/57, pulse (!) 167, temperature 99.3 F (37.4 C), resp. rate (!) 0, height '5\' 5"'$  (1.651 m), weight 90.2 kg,  SpO2 91 %. PAP: (41-74)/(16-46) 47/24 CVP:  [6 mmHg-24 mmHg] 11 mmHg PCWP:  [25 mmHg] 25 mmHg CO:  [4.6 L/min-6.5 L/min] 4.6 L/min CI:  [2.4 L/min/m2-3.3 L/min/m2] 2.4 L/min/m2      Intake/Output Summary (Last 24 hours) at Aug 18, 2022 1559 Last data filed at 08-18-22 1300 Gross per 24 hour  Intake 1499.13 ml  Output 1152 ml  Net 347.13 ml   Filed Weights   07/16/2022 0735 07/26/22 0500 08-18-2022 0500  Weight: 83.5 kg 88.9 kg 90.2 kg    Examination: General: Appears younger than stated age.  In moderate to severe respiratory distress. HENT: Ashen complexion Lungs: Crackles rhonchi throughout Cardiovascular: Heart sounds distant obscured by respiratory sounds. Abdomen: Generalized tenderness Extremities: Cool to touch. Neuro: Intact. GU: Foley catheter in place amber urine.  Ancillary tests personally reviewed  Sodium now 132 with worsening creatinine to 3.52 Mild leukocytosis 12.1 Lactic acid 2.4  Assessment & Plan:  Critically ill due to worsening cardiogenic shock Critically ill due to acute hypoxic respiratory failure. Ischemic cardiomyopathy with high risk lesion. Acute kidney injury on CKD stage III  Plan:  -Cardiogenic shock and worsening pulmonary edema in patient without clear exit strategy. -Prognosis likely to be poor at this point given cardiogenic shock and elderly patient even with successful revascularization. -Have discussed with patient and daughter that intubation would not address underlying problem and simply prolong inevitable. -Have started Dilaudid infusion for comfort with the plan to transition to full comfort care.  Einar Grad  Lynetta Mare, MD [facility] ICU Physician [place]  Pager: [phone] Or Epic Secure Chat After hours: [phone].  2022/08/05, 3:59 PM

## 2022-07-31 NOTE — Telephone Encounter (Signed)
Pat with [employer] left vm stating they need pts death certificate signed and it was assigned to [place].

## 2022-07-31 DEATH — deceased

## 2022-08-01 ENCOUNTER — Telehealth: Payer: Self-pay | Admitting: Cardiology

## 2022-08-01 NOTE — Telephone Encounter (Signed)
Lmtcb.

## 2022-08-01 NOTE — Telephone Encounter (Signed)
Left message for Fraser Din to return my call regarding death certificate.

## 2022-08-01 NOTE — Telephone Encounter (Signed)
[name] with Paul Kramer would like to know if Dr. Ellyn Hack is able to sign patient's death certificate. Patient passed away on 08-14-2022.

## 2022-08-01 NOTE — Telephone Encounter (Signed)
See other note. LMTCB

## 2022-08-01 NOTE — Telephone Encounter (Signed)
Returning nurses call regarding Death Certificate. Please advise

## 2022-08-06 NOTE — Telephone Encounter (Signed)
Dr Ellyn Hack signed death cert.

## 2022-08-08 ENCOUNTER — Ambulatory Visit: Payer: Medicare Other | Admitting: Nurse Practitioner

## 2022-08-29 ENCOUNTER — Ambulatory Visit: Payer: Medicare Other | Admitting: Cardiology

## 2022-08-29 NOTE — Death Summary Note (Signed)
  Advanced Heart Failure Death Summary  Death Summary   Patient ID: Paul Kramer MRN: [id number], DOB/AGE: 1930/01/08 89 y.o. Admit date: 07/09/2022 D/C date:     2022/08/02   Primary Discharge Diagnoses:  Acute on Chronic Systolic Heart Failure>>Cardiogenic Shock  Unstable Angina, Obstructive CAD  Acute Hypoxic Respiratory Failure AKI on CKD IIIb   Hospital Course:   89 y.o. male with chronic HFrEF due to ICM, CAD s/p fairly recent complex high risk PCI to LM-LAD-LCx 01/2022, known CTO of m-dRCA, PAF on Eliquis, HTN, HLD, TIA and CKD IV. Presented on 1/26 for elective R/LHC in setting of recurrent unstable angina and exertional dyspnea. R/LHC showed severe in-stent restenosis of the bifurcation LM-LCx-LAD stents, elevated R+ L filling pressures and low output w/ CI 1.6. AHF team called to cath lab. After discussion w/ interventional cardiology, the decision was made to place an IABP for support; impella placement would be difficult due to peripheral vascular disease and risk of limb malperfusion. After IABP placement, Mr. Faubert had improvement in PA pressures to 40s-50s/30s. The decision was made to defer PCI until medical optimization. He was admitted to ICU and required additional support w/ inotropes. Unfortunately patient experienced worsening cardiogenic shock, increasing respiratory distress and MSOF. After goals of care discussion w/ patient and family, decision was made not to escalate care and to transition to comfort care. He was started on dilaudid infusion per CCM. Inotropes and pressors discontinued. Patient passed Aug 02, 2022.   Significant Diagnostic Studies  [facility] 07/18/2022 Severe in-stent restenosis of the bifurcation LM-LCx-LAD stents at the bifurcation only. (99%); known occlusion of the RCA Coronary angiography performed with <15 mL IV contrast Severe ischemic cardiomyopathy with cardiac index of 1161 according to output of 3.07 by Fick which correlates with pression of renal  failure (creatinine on that was up to 3 3) Severe Secondary Pulmonary (Venous) Hypertension with mean PAP of 46 mmHg and PCWP of 36 mm, and LVEDP of 24 mmHg. Successful IABP placement with notable improvement in right heart numbers.   2D Echo 07/26/22 1. Hypokinesis of the inferolateral wall with overall moderate LV  dysfunction.   2. Left ventricular ejection fraction, by estimation, is 35 to 40%. The  left ventricle has moderately decreased function. The left ventricle  demonstrates regional wall motion abnormalities (see scoring  diagram/findings for description). The left  ventricular internal cavity size was mildly dilated. Left ventricular  diastolic function could not be evaluated.   3. Right ventricular systolic function is moderately reduced. The right  ventricular size is normal. There is mildly elevated pulmonary artery  systolic pressure.   4. Left atrial size was severely dilated.   5. The mitral valve is normal in structure. Mild mitral valve  regurgitation. No evidence of mitral stenosis.   6. The aortic valve is tricuspid. Aortic valve regurgitation is mild.  Mild to moderate aortic valve stenosis.   7. The inferior vena cava is normal in size with greater than 50%  respiratory variability, suggesting right atrial pressure of 3 mmHg.    Consultations  Cardiology  Advanced Heart Failure Pulmonary and Critical Care   Duration of Discharge Encounter: Greater than 35 minutes   Signed, Lyda Jester, PA-C 08/08/2022, 2:43 PM
